# Patient Record
Sex: Female | Born: 1935 | Race: White | Hispanic: No | Smoking: Never smoker
Health system: Southern US, Community
[De-identification: ages and names within clinical notes are randomized; demographics above are authoritative.]

## PROBLEM LIST (undated history)

## (undated) DIAGNOSIS — C44301 Unspecified malignant neoplasm of skin of nose: Secondary | ICD-10-CM

## (undated) DIAGNOSIS — I4891 Unspecified atrial fibrillation: Secondary | ICD-10-CM

## (undated) DIAGNOSIS — M7070 Other bursitis of hip, unspecified hip: Secondary | ICD-10-CM

## (undated) DIAGNOSIS — M1990 Unspecified osteoarthritis, unspecified site: Secondary | ICD-10-CM

## (undated) DIAGNOSIS — N19 Unspecified kidney failure: Secondary | ICD-10-CM

## (undated) DIAGNOSIS — I10 Essential (primary) hypertension: Secondary | ICD-10-CM

## (undated) DIAGNOSIS — R519 Headache, unspecified: Secondary | ICD-10-CM

## (undated) DIAGNOSIS — M8580 Other specified disorders of bone density and structure, unspecified site: Secondary | ICD-10-CM

## (undated) DIAGNOSIS — I509 Heart failure, unspecified: Secondary | ICD-10-CM

## (undated) DIAGNOSIS — I447 Left bundle-branch block, unspecified: Secondary | ICD-10-CM

## (undated) DIAGNOSIS — R4182 Altered mental status, unspecified: Secondary | ICD-10-CM

## (undated) DIAGNOSIS — I428 Other cardiomyopathies: Secondary | ICD-10-CM

## (undated) DIAGNOSIS — G500 Trigeminal neuralgia: Secondary | ICD-10-CM

## (undated) DIAGNOSIS — N390 Urinary tract infection, site not specified: Secondary | ICD-10-CM

## (undated) DIAGNOSIS — J449 Chronic obstructive pulmonary disease, unspecified: Secondary | ICD-10-CM

## (undated) DIAGNOSIS — E785 Hyperlipidemia, unspecified: Secondary | ICD-10-CM

## (undated) DIAGNOSIS — G4700 Insomnia, unspecified: Secondary | ICD-10-CM

## (undated) DIAGNOSIS — D649 Anemia, unspecified: Secondary | ICD-10-CM

## (undated) DIAGNOSIS — T8859XA Other complications of anesthesia, initial encounter: Secondary | ICD-10-CM

## (undated) DIAGNOSIS — T4145XA Adverse effect of unspecified anesthetic, initial encounter: Secondary | ICD-10-CM

## (undated) DIAGNOSIS — R51 Headache: Secondary | ICD-10-CM

## (undated) DIAGNOSIS — I519 Heart disease, unspecified: Secondary | ICD-10-CM

## (undated) DIAGNOSIS — E871 Hypo-osmolality and hyponatremia: Secondary | ICD-10-CM

## (undated) DIAGNOSIS — Z9981 Dependence on supplemental oxygen: Secondary | ICD-10-CM

## (undated) HISTORY — DX: Hyperlipidemia, unspecified: E78.5

## (undated) HISTORY — PX: TONSILLECTOMY AND ADENOIDECTOMY: SUR1326

## (undated) HISTORY — PX: APPENDECTOMY: SHX54

## (undated) HISTORY — PX: CATARACT EXTRACTION W/ INTRAOCULAR LENS  IMPLANT, BILATERAL: SHX1307

## (undated) HISTORY — PX: DILATION AND CURETTAGE OF UTERUS: SHX78

## (undated) HISTORY — DX: Left bundle-branch block, unspecified: I44.7

## (undated) HISTORY — DX: Headache: R51

## (undated) HISTORY — DX: Heart failure, unspecified: I50.9

## (undated) HISTORY — PX: BREAST SURGERY: SHX581

## (undated) HISTORY — DX: Headache, unspecified: R51.9

## (undated) HISTORY — DX: Other cardiomyopathies: I42.8

## (undated) HISTORY — PX: SKIN CANCER EXCISION: SHX779

## (undated) HISTORY — DX: Heart disease, unspecified: I51.9

## (undated) HISTORY — DX: Unspecified kidney failure: N19

---

## 2009-12-04 HISTORY — PX: NM MYOCAR PERF WALL MOTION: HXRAD629

## 2011-12-05 HISTORY — PX: TRANSTHORACIC ECHOCARDIOGRAM: SHX275

## 2012-06-28 ENCOUNTER — Ambulatory Visit (INDEPENDENT_AMBULATORY_CARE_PROVIDER_SITE_OTHER): Payer: Medicare Other | Admitting: Emergency Medicine

## 2012-06-28 VITALS — BP 128/88 | HR 96 | Temp 97.6°F | Resp 16 | Ht 61.0 in | Wt 133.4 lb

## 2012-06-28 DIAGNOSIS — L03211 Cellulitis of face: Secondary | ICD-10-CM

## 2012-06-28 MED ORDER — MUPIROCIN CALCIUM 2 % EX CREA: TOPICAL_CREAM | Freq: Three times a day (TID) | CUTANEOUS | Status: AC

## 2012-06-28 MED ORDER — SULFAMETHOXAZOLE-TRIMETHOPRIM 800-160 MG PO TABS: 1.0000 | ORAL_TABLET | Freq: Two times a day (BID) | ORAL | Status: DC

## 2012-06-28 MED ORDER — LEVALBUTEROL TARTRATE 45 MCG/ACT IN AERO: 1.0000 | INHALATION_SPRAY | RESPIRATORY_TRACT | Status: DC | PRN

## 2012-06-28 NOTE — Progress Notes (Signed)
   Date:  06/28/2012   Name:  Angela Franco   DOB:  07-23-1929   MRN:  [id number]  PCP:  No primary provider on file.    Chief Complaint: Facial Injury   History of Present Illness:  Angela Franco is a 78 y.o. very pleasant female patient who presents with the following:  Had scraping of a superficial skin cancer done a week ago.  Now infected with a low grade cellulitis  No fever or chills.  No other complaint  There is no problem list on file for this patient.   No past medical history on file.  No past surgical history on file.  History  Substance Use Topics  . Smoking status: Never Smoker   . Smokeless tobacco: Not on file  . Alcohol Use: Not on file    No family history on file.  Allergies  Allergen Reactions  . Penicillins Hives    Medication list has been reviewed and updated.  Current Outpatient Prescriptions on File Prior to Visit  Medication Sig Dispense Refill  . amLODipine (NORVASC) 5 MG tablet Take 5 mg by mouth daily.      . calcium carbonate (OS-CAL) 600 MG TABS Take 600 mg by mouth 2 (two) times daily with a meal.      . carbamazepine (CARBATROL) 100 MG 12 hr capsule Take 100 mg by mouth 2 (two) times daily.      . fexofenadine (ALLEGRA) 180 MG tablet Take 180 mg by mouth daily.      [name] losartan (COZAAR) 100 MG tablet Take 100 mg by mouth daily.      . rosuvastatin (CRESTOR) 10 MG tablet Take 10 mg by mouth daily.      [name] tiotropium (SPIRIVA) 18 MCG inhalation capsule Place 18 mcg into inhaler and inhale daily.      . traZODone (DESYREL) 50 MG tablet Take 50 mg by mouth at bedtime. 1-2 at bedtime as needed.        Review of Systems:  As per HPI, otherwise negative.    Physical Examination: Filed Vitals:   06/28/12 1432  BP: 128/88  Pulse: 96  Temp: 97.6 F (36.4 C)  Resp: 16   Filed Vitals:   06/28/12 1432  Height: 5\' 1"  (1.549 m)  Weight: 133 lb 6.4 oz (60.51 kg)   Body mass index is 25.21 kg/(m^2). Ideal Body Weight: Weight in  (lb) to have BMI = 25: 132    GEN: WDWN, NAD, Non-toxic, Alert & Oriented x 3 HEENT: Atraumatic, Normocephalic.  Ears and Nose: No external deformity. EXTR: No clubbing/cyanosis/edema NEURO: Normal gait.  PSYCH: Normally interactive. Conversant. Not depressed or anxious appearing.  Calm demeanor.  Nose surgical wound with cellulitis  Assessment and Plan: Cellulitis nose asthma  Carmelina Dane, MD

## 2012-06-28 NOTE — Patient Instructions (Addendum)

## 2012-06-29 ENCOUNTER — Other Ambulatory Visit: Payer: Self-pay

## 2012-06-29 ENCOUNTER — Encounter (HOSPITAL_COMMUNITY): Payer: Self-pay | Admitting: *Deleted

## 2012-06-29 ENCOUNTER — Emergency Department (HOSPITAL_COMMUNITY): Payer: Medicare Other

## 2012-06-29 ENCOUNTER — Inpatient Hospital Stay (HOSPITAL_COMMUNITY)
Admission: EM | Admit: 2012-06-29 | Discharge: 2012-07-08 | DRG: 287 | Disposition: A | Discharge status: Nursing Home (Includes ICF) | Payer: Medicare Other | Attending: Internal Medicine | Admitting: Internal Medicine

## 2012-06-29 DIAGNOSIS — Z66 Do not resuscitate: Secondary | ICD-10-CM | POA: Diagnosis present

## 2012-06-29 DIAGNOSIS — I482 Chronic atrial fibrillation, unspecified: Secondary | ICD-10-CM | POA: Diagnosis present

## 2012-06-29 DIAGNOSIS — I10 Essential (primary) hypertension: Secondary | ICD-10-CM | POA: Diagnosis present

## 2012-06-29 DIAGNOSIS — R338 Other retention of urine: Secondary | ICD-10-CM | POA: Diagnosis not present

## 2012-06-29 DIAGNOSIS — R791 Abnormal coagulation profile: Secondary | ICD-10-CM | POA: Diagnosis present

## 2012-06-29 DIAGNOSIS — I272 Pulmonary hypertension, unspecified: Secondary | ICD-10-CM | POA: Diagnosis present

## 2012-06-29 DIAGNOSIS — L03211 Cellulitis of face: Secondary | ICD-10-CM | POA: Diagnosis present

## 2012-06-29 DIAGNOSIS — I428 Other cardiomyopathies: Secondary | ICD-10-CM | POA: Diagnosis present

## 2012-06-29 DIAGNOSIS — R531 Weakness: Secondary | ICD-10-CM

## 2012-06-29 DIAGNOSIS — R7989 Other specified abnormal findings of blood chemistry: Secondary | ICD-10-CM | POA: Diagnosis present

## 2012-06-29 DIAGNOSIS — Z7901 Long term (current) use of anticoagulants: Secondary | ICD-10-CM

## 2012-06-29 DIAGNOSIS — L0201 Cutaneous abscess of face: Secondary | ICD-10-CM

## 2012-06-29 DIAGNOSIS — I2789 Other specified pulmonary heart diseases: Secondary | ICD-10-CM | POA: Diagnosis present

## 2012-06-29 DIAGNOSIS — D649 Anemia, unspecified: Secondary | ICD-10-CM | POA: Diagnosis present

## 2012-06-29 DIAGNOSIS — I509 Heart failure, unspecified: Secondary | ICD-10-CM | POA: Diagnosis present

## 2012-06-29 DIAGNOSIS — I5021 Acute systolic (congestive) heart failure: Principal | ICD-10-CM

## 2012-06-29 DIAGNOSIS — T45515A Adverse effect of anticoagulants, initial encounter: Secondary | ICD-10-CM | POA: Diagnosis present

## 2012-06-29 DIAGNOSIS — I447 Left bundle-branch block, unspecified: Secondary | ICD-10-CM | POA: Diagnosis present

## 2012-06-29 DIAGNOSIS — I4891 Unspecified atrial fibrillation: Secondary | ICD-10-CM

## 2012-06-29 DIAGNOSIS — R109 Unspecified abdominal pain: Secondary | ICD-10-CM | POA: Diagnosis not present

## 2012-06-29 DIAGNOSIS — E871 Hypo-osmolality and hyponatremia: Secondary | ICD-10-CM | POA: Diagnosis present

## 2012-06-29 DIAGNOSIS — G500 Trigeminal neuralgia: Secondary | ICD-10-CM | POA: Diagnosis present

## 2012-06-29 DIAGNOSIS — I5043 Acute on chronic combined systolic (congestive) and diastolic (congestive) heart failure: Secondary | ICD-10-CM | POA: Diagnosis present

## 2012-06-29 DIAGNOSIS — R110 Nausea: Secondary | ICD-10-CM | POA: Diagnosis not present

## 2012-06-29 HISTORY — DX: Urinary tract infection, site not specified: N39.0

## 2012-06-29 HISTORY — DX: Essential (primary) hypertension: I10

## 2012-06-29 HISTORY — DX: Chronic obstructive pulmonary disease, unspecified: J44.9

## 2012-06-29 HISTORY — DX: Trigeminal neuralgia: G50.0

## 2012-06-29 HISTORY — DX: Other bursitis of hip, unspecified hip: M70.70

## 2012-06-29 HISTORY — DX: Insomnia, unspecified: G47.00

## 2012-06-29 HISTORY — DX: Hypo-osmolality and hyponatremia: E87.1

## 2012-06-29 HISTORY — DX: Other specified disorders of bone density and structure, unspecified site: M85.80

## 2012-06-29 HISTORY — DX: Unspecified atrial fibrillation: I48.91

## 2012-06-29 LAB — TROPONIN I
Troponin I: 0.63 ng/mL (ref <0.30)
Troponin I: 0.59 ng/mL (ref <0.30)

## 2012-06-29 LAB — CBC WITH DIFFERENTIAL/PLATELET
Basophils Relative: 0 % (ref 0–1)
HCT: 30.6 % — ABNORMAL LOW (ref 36.0–46.0)
Hemoglobin: 10.7 g/dL — ABNORMAL LOW (ref 12.0–15.0)
Lymphocytes Relative: 7 % — ABNORMAL LOW (ref 12–46)
Lymphs Abs: 0.5 10*3/uL — ABNORMAL LOW (ref 0.7–4.0)
Monocytes Absolute: 0.9 10*3/uL (ref 0.1–1.0)
Monocytes Relative: 12 % (ref 3–12)
Neutro Abs: 5.8 10*3/uL (ref 1.7–7.7)
Neutrophils Relative %: 80 % — ABNORMAL HIGH (ref 43–77)
RBC: 3.6 MIL/uL — ABNORMAL LOW (ref 3.87–5.11)
WBC: 7.2 10*3/uL (ref 4.0–10.5)

## 2012-06-29 LAB — BASIC METABOLIC PANEL
BUN: 16 mg/dL (ref 6–23)
CO2: 25 mEq/L (ref 19–32)
Chloride: 82 mEq/L — ABNORMAL LOW (ref 96–112)
GFR calc Af Amer: 59 mL/min — ABNORMAL LOW (ref >90)
Glucose, Bld: 97 mg/dL (ref 70–99)
Potassium: 3.8 mEq/L (ref 3.5–5.1)

## 2012-06-29 LAB — CARDIAC PANEL(CRET KIN+CKTOT+MB+TROPI)
Relative Index: INVALID (ref 0.0–2.5)
Total CK: 21 U/L (ref 7–177)

## 2012-06-29 LAB — URIC ACID: Uric Acid, Serum: 3.6 mg/dL (ref 2.4–7.0)

## 2012-06-29 LAB — URINALYSIS, ROUTINE W REFLEX MICROSCOPIC
Bilirubin Urine: NEGATIVE
Glucose, UA: NEGATIVE mg/dL
Nitrite: NEGATIVE
Specific Gravity, Urine: 1.016 (ref 1.005–1.030)
pH: 6.5 (ref 5.0–8.0)

## 2012-06-29 LAB — URINE MICROSCOPIC-ADD ON

## 2012-06-29 LAB — PROTIME-INR: Prothrombin Time: 49 seconds — ABNORMAL HIGH (ref 11.6–15.2)

## 2012-06-29 MED ORDER — SULFAMETHOXAZOLE-TMP DS 800-160 MG PO TABS
1.0000 | ORAL_TABLET | Freq: Two times a day (BID) | ORAL | Status: DC
  Filled 2012-06-29: qty 1

## 2012-06-29 MED ORDER — ATORVASTATIN CALCIUM 20 MG PO TABS
20.0000 mg | ORAL_TABLET | Freq: Every day | ORAL | Status: DC
  Administered 2012-06-29 – 2012-07-07 (×9): 20 mg via ORAL
  Filled 2012-06-29 (×10): qty 1

## 2012-06-29 MED ORDER — CARBAMAZEPINE ER 100 MG PO CP12: 100.0000 mg | ORAL_CAPSULE | Freq: Two times a day (BID) | ORAL | Status: DC

## 2012-06-29 MED ORDER — ZOLPIDEM TARTRATE 5 MG PO TABS
5.0000 mg | ORAL_TABLET | Freq: Every evening | ORAL | Status: DC | PRN
  Administered 2012-06-29 – 2012-07-04 (×2): 5 mg via ORAL
  Filled 2012-06-29 (×2): qty 1

## 2012-06-29 MED ORDER — SODIUM CHLORIDE 0.9 % IV SOLN: INTRAVENOUS | Status: DC

## 2012-06-29 MED ORDER — ONDANSETRON HCL 4 MG/2ML IJ SOLN
4.0000 mg | Freq: Four times a day (QID) | INTRAMUSCULAR | Status: DC | PRN
  Administered 2012-06-30 – 2012-07-03 (×3): 4 mg via INTRAVENOUS
  Filled 2012-06-29 (×3): qty 2

## 2012-06-29 MED ORDER — MUPIROCIN CALCIUM 2 % EX CREA
TOPICAL_CREAM | Freq: Three times a day (TID) | CUTANEOUS | Status: DC
  Administered 2012-06-29 – 2012-07-08 (×24): via TOPICAL
  Filled 2012-06-29 (×2): qty 15

## 2012-06-29 MED ORDER — COUMADIN BOOK
Freq: Once | Status: AC
  Administered 2012-06-29: 20:00:00
  Filled 2012-06-29: qty 1

## 2012-06-29 MED ORDER — SULFAMETHOXAZOLE-TRIMETHOPRIM 800-160 MG PO TABS: 1.0000 | ORAL_TABLET | Freq: Two times a day (BID) | ORAL | Status: DC

## 2012-06-29 MED ORDER — ASPIRIN EC 81 MG PO TBEC
81.0000 mg | DELAYED_RELEASE_TABLET | Freq: Every day | ORAL | Status: DC
  Administered 2012-06-29 – 2012-07-08 (×9): 81 mg via ORAL
  Filled 2012-06-29 (×9): qty 1

## 2012-06-29 MED ORDER — SODIUM CHLORIDE 0.9 % IV SOLN: 250.0000 mL | INTRAVENOUS | Status: DC | PRN

## 2012-06-29 MED ORDER — SODIUM CHLORIDE 0.9 % IJ SOLN: 3.0000 mL | INTRAMUSCULAR | Status: DC | PRN

## 2012-06-29 MED ORDER — ACETAMINOPHEN 325 MG PO TABS
650.0000 mg | ORAL_TABLET | ORAL | Status: DC | PRN
  Administered 2012-06-29 – 2012-07-02 (×6): 650 mg via ORAL
  Filled 2012-06-29 (×7): qty 2

## 2012-06-29 MED ORDER — FUROSEMIDE 10 MG/ML IJ SOLN
40.0000 mg | Freq: Two times a day (BID) | INTRAMUSCULAR | Status: DC
  Administered 2012-06-29 – 2012-07-01 (×4): 40 mg via INTRAVENOUS
  Filled 2012-06-29 (×6): qty 4

## 2012-06-29 MED ORDER — POTASSIUM CHLORIDE CRYS ER 20 MEQ PO TBCR
20.0000 meq | EXTENDED_RELEASE_TABLET | Freq: Two times a day (BID) | ORAL | Status: DC
  Administered 2012-06-29 – 2012-07-08 (×18): 20 meq via ORAL
  Filled 2012-06-29 (×20): qty 1

## 2012-06-29 MED ORDER — SODIUM CHLORIDE 0.9 % IJ SOLN: 3.0000 mL | Freq: Two times a day (BID) | INTRAMUSCULAR | Status: DC

## 2012-06-29 MED ORDER — CARBAMAZEPINE ER 100 MG PO TB12
100.0000 mg | ORAL_TABLET | Freq: Two times a day (BID) | ORAL | Status: DC
  Administered 2012-06-29 – 2012-07-08 (×18): 100 mg via ORAL
  Filled 2012-06-29 (×20): qty 1

## 2012-06-29 MED ORDER — AMLODIPINE BESYLATE 5 MG PO TABS
5.0000 mg | ORAL_TABLET | Freq: Every day | ORAL | Status: DC
  Administered 2012-06-30 – 2012-07-04 (×4): 5 mg via ORAL
  Filled 2012-06-29 (×5): qty 1

## 2012-06-29 MED ORDER — DOXYCYCLINE HYCLATE 100 MG PO TABS
100.0000 mg | ORAL_TABLET | Freq: Two times a day (BID) | ORAL | Status: DC
  Administered 2012-06-29 – 2012-07-01 (×5): 100 mg via ORAL
  Filled 2012-06-29 (×8): qty 1

## 2012-06-29 MED ORDER — LORATADINE 10 MG PO TABS
10.0000 mg | ORAL_TABLET | Freq: Every day | ORAL | Status: DC
  Administered 2012-07-01 – 2012-07-08 (×8): 10 mg via ORAL
  Filled 2012-06-29 (×9): qty 1

## 2012-06-29 MED ORDER — LOSARTAN POTASSIUM 50 MG PO TABS: 100.0000 mg | ORAL_TABLET | Freq: Every day | ORAL | Status: DC

## 2012-06-29 MED ORDER — CARVEDILOL 6.25 MG PO TABS
9.3750 mg | ORAL_TABLET | Freq: Two times a day (BID) | ORAL | Status: DC
  Administered 2012-06-29 – 2012-07-01 (×4): 9.375 mg via ORAL
  Filled 2012-06-29 (×7): qty 1

## 2012-06-29 MED ORDER — LOSARTAN POTASSIUM 50 MG PO TABS
100.0000 mg | ORAL_TABLET | Freq: Every day | ORAL | Status: DC
  Administered 2012-06-30: 100 mg via ORAL
  Filled 2012-06-29 (×2): qty 2

## 2012-06-29 MED ORDER — TIOTROPIUM BROMIDE MONOHYDRATE 18 MCG IN CAPS
18.0000 ug | ORAL_CAPSULE | Freq: Every day | RESPIRATORY_TRACT | Status: DC
  Administered 2012-06-30 – 2012-07-08 (×8): 18 ug via RESPIRATORY_TRACT
  Filled 2012-06-29 (×2): qty 5

## 2012-06-29 MED ORDER — WARFARIN VIDEO
Freq: Once | Status: AC
  Administered 2012-06-30: 12:00:00

## 2012-06-29 NOTE — Progress Notes (Signed)
ANTICOAGULATION CONSULT NOTE - Initial Consult  Pharmacy Consult for Coumadin Indication: atrial fibrillation  Allergies  Allergen Reactions  . Penicillins Hives    Patient Measurements: Height: 5' (152.4 cm) Weight: 130 lb 4.7 oz (59.1 kg) (scale b) IBW/kg (Calculated) : 45.5  Heparin Dosing Weight:   Vital Signs: Temp: 98 F (36.7 C) (07/27 1734) Temp src: Oral (07/27 1734) BP: 143/85 mmHg (07/27 1734) Pulse Rate: 110  (07/27 1734)  Labs:  Basename 06/29/12 1824 06/29/12 1749 06/29/12 1423 06/29/12 1205  HGB -- -- -- 10.7*  HCT -- -- -- 30.6*  PLT -- -- -- 141*  APTT -- -- -- --  LABPROT 49.0* -- -- --  INR 5.26* -- -- --  HEPARINUNFRC -- -- -- --  CREATININE -- -- -- 1.02  CKTOTAL -- 21 -- --  CKMB -- 1.3 -- --  TROPONINI -- 0.63* 0.59* 0.63*    Estimated Creatinine Clearance: 35.3 ml/min (by C-G formula based on Cr of 1.02).   Medical History: Past Medical History  Diagnosis Date  . COPD (chronic obstructive pulmonary disease)     Never smoked. Questionable diagnosis.  . Atrial fibrillation   . Osteopenia   . Insomnia   . Hyponatremia   . Hypertension   . UTI (lower urinary tract infection)   . Bursitis of hip   . Trigeminal neuralgia     Medications:  Scheduled:    . amLODipine  5 mg Oral Daily  . aspirin EC  81 mg Oral Daily  . atorvastatin  20 mg Oral q1800  . carbamazepine  100 mg Oral BID  . carvedilol  9.375 mg Oral BID WC  . doxycycline  100 mg Oral Q12H  . furosemide  40 mg Intravenous BID  . loratadine  10 mg Oral Daily  . losartan  100 mg Oral Daily  . mupirocin cream   Topical TID  . potassium chloride  20 mEq Oral BID  . tiotropium  18 mcg Inhalation Daily  . DISCONTD: sodium chloride   Intravenous STAT  . DISCONTD: carbamazepine  100 mg Oral BID  . DISCONTD: losartan  100 mg Oral Daily  . DISCONTD: sodium chloride  3 mL Intravenous Q12H  . DISCONTD: sulfamethoxazole-trimethoprim  1 tablet Oral Q12H  . DISCONTD:  sulfamethoxazole-trimethoprim  1 tablet Oral BID    Assessment: 78 yo female with AFib who has been receiving Septra at home for a infection that developed after skin CA excision.  Septra has a significant interaction with Coumadin which may increase the Coumadin effect; her baseline INR is 5.26.  No bleeding problems noted.  She has not taken her Coumadin today.  Goal of Therapy:  INR 2-3 Monitor platelets by anticoagulation protocol: Yes   Plan:  1.  No Coumadin this evening 2.  Daily INR 3.  Coumadin book & video  Marisue Humble, PharmD Clinical Pharmacist [facility]

## 2012-06-29 NOTE — ED Notes (Signed)
Charge made aware for need for room, will move pt back

## 2012-06-29 NOTE — Progress Notes (Signed)
Made MD aware of Troponin levels and INR value. No new orders given. MD stated to call if next lab draw level is elevated or if anything changes. Will report to receiving RN to monitor for further changes in condition.

## 2012-06-29 NOTE — Progress Notes (Signed)
CRITICAL VALUE ALERT  Critical value received: INR 5.26  Date of notification:  06/29/12  Time of notification:  1857  Critical value read back:yes  Nurse who received alert:  Junie Panning RN  MD notified (1st page):  Benedetto Coons  Time of first page: 2025  MD notified (2nd page):  Time of second page:  Responding MD:  Benedetto Coons  Time MD responded:  2030

## 2012-06-29 NOTE — ED Notes (Signed)
Family reports fever, weakness, lower extremity swelling (hx of new onset chf), back pain, and had appt with dermatologist this week and reports infection from that procedure (pt was placed on antibiotics). Pt is new to area.

## 2012-06-29 NOTE — H&P (Signed)
History and Physical  Angela Franco WUJ:[id number] DOB: [birth year] DOA: 06/29/2012  Referring physician: [name], MD PCP: No primary provider on file. None in Angela Franco. Former PCP [name], MD [place], [place]  Chief Complaint: weakness  HPI:  78 year old woman with history of hyponatremia (on Carbamazepine) last hospitalized January of this year for the same (responded to fluid restriction) presented today with 1-2 week history of gradual decline, lethargy, some confusion and increasing lower extremity edema. She reports she is chronic hyponatremia but cannot recall her baseline. She refuses to discontinue carbamazepine because of intractable pain without it.  She has been told that she is "right heart failure" was not been on diuretics because of her history of hyponatremia. She denies any previous cardiac history other than atrial fibrillation. Approximately one week ago she had skin cancer removed from her nose. She was seen in urgent care later started on antibiotics (Septra) for post procedure infection.  In the emergency department she was noted to be afebrile with stable vital signs. Sodium was 120, chloride 82, hemoglobin 10.7, troponin 0.59, BNP 3820. Chest x-ray was negative. EKG independently reviewed showed atrial fibrillation with LBBB (old), anterior infarct (old).  Patient noted back pain upon awakening this morning which has been present all day and improves with movement.  Record Review (obtained by fax from PCP's office):  05/15/12 Hemoglobin 10.9, sodium 126, chloride 88  05/15/12 2-d echocardiogram: LVEF 40-45%, RV pressure 73, severe pulmonary hypertension, moderate tricuspid regurgitation, right atrium moderate-severe dilatation, moderate anterior/anteroseptal hypokinesis.  EKG (undated): SR, LBBB, anterior infarct, old. Overall appearance similar to today's EKG.  Review of Systems:  Negative for fever, changes to her vision, sore throat, rash, new muscle  aches, dysuria, bleeding, nausea, vomiting. Positive for shortness of breath has been progressive.  Past Medical History  Diagnosis Date  . COPD (chronic obstructive pulmonary disease)     Never smoked. Questionable diagnosis.  . Atrial fibrillation   . Osteopenia   . Insomnia   . Hyponatremia   . Hypertension   . UTI (lower urinary tract infection)   . Bursitis of hip   . Trigeminal neuralgia    Past Surgical History  Procedure Date  . Appendectomy   . Tonsillectomy and adenoidectomy    Social History:  reports that she has never smoked. She does not have any smokeless tobacco history on file. Her alcohol and drug histories not on file.  Allergies  Allergen Reactions  . Penicillins Hives   Family History  Problem Relation Age of Onset  . Heart attack Brother   . Heart attack Father    Prior to Admission medications   Medication Sig Start Date End Date Taking? Authorizing Provider  amLODipine (NORVASC) 5 MG tablet Take 5 mg by mouth daily.   Yes Historical Provider, MD  calcium carbonate (OS-CAL) 600 MG TABS Take 600 mg by mouth 2 (two) times daily with a meal.   Yes Historical Provider, MD  carbamazepine (CARBATROL) 100 MG 12 hr capsule Take 100 mg by mouth 2 (two) times daily.   Yes Historical Provider, MD  carvedilol (COREG) 6.25 MG tablet Take 9.37 mg by mouth 2 (two) times daily with a meal.    Yes Historical Provider, MD  fexofenadine (ALLEGRA) 180 MG tablet Take 180 mg by mouth daily.   Yes Historical Provider, MD  levalbuterol [facility] HFA) 45 MCG/ACT inhaler Inhale 1-2 puffs into the lungs every 4 (four) hours as needed for wheezing. 06/28/12 06/28/13 Yes Phillips Odor, MD  losartan (COZAAR)  100 MG tablet Take 100 mg by mouth daily.   Yes Historical Provider, MD  mupirocin cream (BACTROBAN) 2 % Apply topically 3 (three) times daily. 06/28/12 07/05/12 Yes Phillips Odor, MD  rosuvastatin (CRESTOR) 10 MG tablet Take 10 mg by mouth daily.   Yes Historical Provider, MD    sulfamethoxazole-trimethoprim (BACTRIM DS,SEPTRA DS) 800-160 MG per tablet Take 1 tablet by mouth 2 (two) times daily. For 10 days Started 7/26   Yes Historical Provider, MD  tiotropium (SPIRIVA) 18 MCG inhalation capsule Place 18 mcg into inhaler and inhale daily.   Yes Historical Provider, MD  traZODone (DESYREL) 50 MG tablet Take 100 mg by mouth at bedtime.    Yes Historical Provider, MD  warfarin (COUMADIN) 5 MG tablet Take 5-7.5 mg by mouth daily. 1 tablet on Tuesday and Friday 1.5 tablet all other days   Yes Historical Provider, MD   Physical Exam: Filed Vitals:   06/29/12 1202  BP: 136/68  Pulse: 100  Temp: 98.4 F (36.9 C)  TempSrc: Oral  Resp: 18  SpO2: 95%     General:  Appears calm and comfortable. Examined in the emergency department.  Eyes: Pupils equal, round, react to light. Normal lids, irises.  ENT: Grossly normal hearing. Lips and tongue appear unremarkable.  Neck: No lymphadenopathy or masses. No thyromegaly.  Cardiovascular: Irregular rhythm, tachycardia 100-110s. No murmur, rub or gallop. No lower extremity edema.  Respiratory: Posterior rales bilaterally. Good air movement. Normal respiratory effort. No wheezes or rhonchi.  Abdomen: Soft, nontender, nondistended.  Skin: Chronic venous stasis changes bilateral lower extremities. No rash or induration.  Musculoskeletal: Tone and strength appears grossly normal. Able to sit up in bed without difficulty.  Psychiatric: Grossly normal mood and affect. Speech fluent and appropriate.  Neurologic: Grossly nonfocal  Labs on Admission:  Basic Metabolic Panel:  Lab 06/29/12 4098  NA 120*  K 3.8  CL 82*  CO2 25  GLUCOSE 97  BUN 16  CREATININE 1.02  CALCIUM 9.3  MG --  PHOS --   CBC:  Lab 06/29/12 1205  WBC 7.2  NEUTROABS 5.8  HGB 10.7*  HCT 30.6*  MCV 85.0  PLT 141*   Cardiac Enzymes:  Lab 06/29/12 1423 06/29/12 1205  CKTOTAL -- --  CKMB -- --  CKMBINDEX -- --  TROPONINI 0.59* 0.63*     Basename 06/29/12 1423  PROBNP 3820.0*   Radiological Exams on Admission: Dg Chest 2 View  06/29/2012  *RADIOLOGY REPORT*  Clinical Data: Fever, leg swelling  CHEST - 2 VIEW  Comparison: None.  Findings: Cardiomediastinal silhouette is unremarkable.  No acute infiltrate or pulmonary edema.  Mild atelectasis right base. Nodular density right base may represent nipple shadow.  Repeat frontal view with nipple markers is recommended for confirmation. Osteopenia and mild degenerative changes thoracic spine.  IMPRESSION: No acute infiltrate or pulmonary edema. Mild atelectasis right base.  Nodular density right base may represent nipple shadow. Repeat frontal view with nipple markers is recommended for confirmation.  Original Report Authenticated By: Natasha Mead, M.D.   Principal Problem:  *Hyponatremia Active Problems:  Acute systolic CHF (congestive heart failure)  Elevated troponin  Normocytic anemia  Atrial fibrillation  Trigeminal neuralgia  Moderate to severe pulmonary hypertension   Assessment/Plan 1. Hyponatremia: Mildly symptomatic. Most likely secondary to Tegretol, possibly complicated by recent addition of Trazodone. Urine studies. Discontinue trazodone. Fluid restriction. Patient has severe pain when taken off Tegretol. 2. Acute systolic heart failure: Complicated by severe pulmonary hypertension. Mildly symptomatic. Lasix.  3. Severe pulmonary hypertension: Etiology unclear. No history to suggest VTE. Will discuss with PCP 7/29 re: any other investigation already performed. 4. Elevated troponin: Most likely related to acute heart failure. Patient has had back pain this morning but has not had chest pain. Her back pain improves with movement. No history to suggest acute coronary syndrome at this time. Serial cardiac enzymes. EKG without significant change compared to old. 5. Normocytic anemia: Stable. 6. Atrial fibrillation: Continue warfarin, carvedilol, losartan. 7. History of  cellulitis: Post procedure infection of the nose. Continue Bactrim. 8. Trigeminal neuralgia: Continue carbamazepine--patient refuses to discontinue and other agents have been ineffective-- resulting in excruciating pain.  See summary of records above (which I requested by telephone)  Code Status: DO NOT RESUSCITATE Family Communication: Discussed with daughter at bedside Disposition Plan: Home when improved  Time spent: 12 minutes  Brendia Sacks, MD  [facility] Pager [phone] If 7PM-7AM, please contact floor/night-coverage at [email or web address], password [facility] 06/29/2012, 3:51 PM

## 2012-06-29 NOTE — ED Provider Notes (Signed)
History     CSN: [id number]  Arrival date & time 06/29/12  1143   First MD Initiated Contact with Patient 06/29/12 1401      Chief Complaint  Patient presents with  . Fever  . Leg Swelling    (Consider location/radiation/quality/duration/timing/severity/associated sxs/prior treatment) HPI Comments: Angela Franco is a 78 y.o. Female with weakness and lethargy, worsening today. She was started on antibiotics yesterday, for a nose infection following a dermatologic nose, procedure, 6 days ago. She has had progressive, extremity edema, for one and half months. She saw her PCP,  earlier this week. Prior to moving to [place]. She had cardiac echo 1 month ago that reportedly showed right heart failure. She has general weakness. She denies chest pain. She has mild, shortness of breath. She's had chills today without documented fever. She denies cough. She's been taking her medications as prescribed. No aggravating or palliative factors  Patient is a 78 y.o. female presenting with fever. The history is provided by the patient.  Fever Primary symptoms of the febrile illness include fever.    Past Medical History  Diagnosis Date  . COPD (chronic obstructive pulmonary disease)   . Atrial fibrillation   . Osteopenia   . Insomnia   . Hyponatremia   . Hypertension   . UTI (lower urinary tract infection)   . Bursitis of hip     History reviewed. No pertinent past surgical history.  History reviewed. No pertinent family history.  History  Substance Use Topics  . Smoking status: Never Smoker   . Smokeless tobacco: Not on file  . Alcohol Use: Not on file    OB History    Grav Para Term Preterm Abortions TAB SAB Ect Mult Living                  Review of Systems  Constitutional: Positive for fever.  All other systems reviewed and are negative.    Allergies  Penicillins  Home Medications   Current Outpatient Rx  Name Route Sig Dispense Refill  . AMLODIPINE BESYLATE 5  MG PO TABS Oral Take 5 mg by mouth daily.    [name] CALCIUM CARBONATE 600 MG PO TABS Oral Take 600 mg by mouth 2 (two) times daily with a meal.    . CARBAMAZEPINE ER 100 MG PO CP12 Oral Take 100 mg by mouth 2 (two) times daily.    [name] CARVEDILOL 6.25 MG PO TABS Oral Take 9.37 mg by mouth 2 (two) times daily with a meal.     . FEXOFENADINE HCL 180 MG PO TABS Oral Take 180 mg by mouth daily.    [name] LEVALBUTEROL TARTRATE 45 MCG/ACT IN AERO Inhalation Inhale 1-2 puffs into the lungs every 4 (four) hours as needed for wheezing. 1 Inhaler 12  . LOSARTAN POTASSIUM 100 MG PO TABS Oral Take 100 mg by mouth daily.    [name] MUPIROCIN CALCIUM 2 % EX CREA Topical Apply topically 3 (three) times daily. 15 g 0  . ROSUVASTATIN CALCIUM 10 MG PO TABS Oral Take 10 mg by mouth daily.    . SULFAMETHOXAZOLE-TRIMETHOPRIM 800-160 MG PO TABS Oral Take 1 tablet by mouth 2 (two) times daily. For 10 days Started 7/26    . TIOTROPIUM BROMIDE MONOHYDRATE 18 MCG IN CAPS Inhalation Place 18 mcg into inhaler and inhale daily.    . TRAZODONE HCL 50 MG PO TABS Oral Take 100 mg by mouth at bedtime.     . WARFARIN SODIUM 5 MG PO TABS  Oral Take 5-7.5 mg by mouth daily. 1 tablet on Tuesday and Friday 1.5 tablet all other days      BP 136/68  Pulse 100  Temp 98.4 F (36.9 C) (Oral)  Resp 18  SpO2 95%  Physical Exam  Nursing note and vitals reviewed. Constitutional: She is oriented to person, place, and time. She appears well-developed. She appears distressed (Mild).       She is frail  HENT:  Head: Normocephalic and atraumatic.  Eyes: Conjunctivae and EOM are normal. Pupils are equal, round, and reactive to light.  Neck: Normal range of motion and phonation normal. Neck supple.  Cardiovascular: Normal rate, regular rhythm and intact distal pulses.   Pulmonary/Chest: Effort normal. No respiratory distress. She has no wheezes. She has rales (Bilateral). She exhibits no tenderness.  Abdominal: Soft. She exhibits no distension. There is  no tenderness. There is no guarding.  Musculoskeletal: Normal range of motion. She exhibits edema (2+, peripheral.). She exhibits no tenderness.  Neurological: She is alert and oriented to person, place, and time. She has normal strength. She exhibits normal muscle tone.  Skin: Skin is warm and dry.  Psychiatric: She has a normal mood and affect. Her behavior is normal.    ED Course  Procedures (including critical care time)   Date: 06/29/2012  Rate: 88  Rhythm: atrial fibrillation  QRS Axis: right  Intervals: QT normal  ST/T Wave abnormalities: nonspecific ST changes  Conduction Disutrbances:nonspecific intraventricular conduction delay  Narrative Interpretation:   Old EKG Reviewed: none available  BMP, and second cardiac troponin ordered.   Labs Reviewed  TROPONIN I - Abnormal; Notable for the following:    Troponin I 0.63 (*)     All other components within normal limits  CBC WITH DIFFERENTIAL - Abnormal; Notable for the following:    RBC 3.60 (*)     Hemoglobin 10.7 (*)     HCT 30.6 (*)     Platelets 141 (*)     Neutrophils Relative 80 (*)     Lymphocytes Relative 7 (*)     Lymphs Abs 0.5 (*)     All other components within normal limits  BASIC METABOLIC PANEL - Abnormal; Notable for the following:    Sodium 120 (*)     Chloride 82 (*)     GFR calc non Af Amer 51 (*)     GFR calc Af Amer 59 (*)     All other components within normal limits  URINALYSIS, ROUTINE W REFLEX MICROSCOPIC - Abnormal; Notable for the following:    Hgb urine dipstick SMALL (*)     Ketones, ur 15 (*)     All other components within normal limits  PRO B NATRIURETIC PEPTIDE - Abnormal; Notable for the following:    Pro B Natriuretic peptide (BNP) 3820.0 (*)     All other components within normal limits  TROPONIN I - Abnormal; Notable for the following:    Troponin I 0.59 (*)     All other components within normal limits  URINE MICROSCOPIC-ADD ON   Dg Chest 2 View  06/29/2012  *RADIOLOGY  REPORT*  Clinical Data: Fever, leg swelling  CHEST - 2 VIEW  Comparison: None.  Findings: Cardiomediastinal silhouette is unremarkable.  No acute infiltrate or pulmonary edema.  Mild atelectasis right base. Nodular density right base may represent nipple shadow.  Repeat frontal view with nipple markers is recommended for confirmation. Osteopenia and mild degenerative changes thoracic spine.  IMPRESSION: No acute infiltrate or  pulmonary edema. Mild atelectasis right base.  Nodular density right base may represent nipple shadow. Repeat frontal view with nipple markers is recommended for confirmation.  Original Report Authenticated By: Natasha Mead, M.D.     1. Weakness   2. Hyponatremia       MDM  Nonspecific weakness, with hyponatremia. Doubt ACS, pneumonia, or congestive heart failure. Troponin is unchanged over 2 samples. Recent cardiac echo results not available. Elevated BNP, is nonspecific. She'll need to be admitted for observation and further management.    Plan: Admit- [facility].    Flint Melter, MD 06/29/12 [phone]

## 2012-06-30 DIAGNOSIS — Z7901 Long term (current) use of anticoagulants: Secondary | ICD-10-CM

## 2012-06-30 DIAGNOSIS — I428 Other cardiomyopathies: Secondary | ICD-10-CM | POA: Diagnosis present

## 2012-06-30 DIAGNOSIS — I10 Essential (primary) hypertension: Secondary | ICD-10-CM | POA: Diagnosis present

## 2012-06-30 DIAGNOSIS — I447 Left bundle-branch block, unspecified: Secondary | ICD-10-CM | POA: Diagnosis present

## 2012-06-30 DIAGNOSIS — R791 Abnormal coagulation profile: Secondary | ICD-10-CM | POA: Diagnosis present

## 2012-06-30 LAB — CARDIAC PANEL(CRET KIN+CKTOT+MB+TROPI)
Relative Index: INVALID (ref 0.0–2.5)
Relative Index: INVALID (ref 0.0–2.5)
Total CK: 22 U/L (ref 7–177)
Troponin I: 0.32 ng/mL (ref <0.30)

## 2012-06-30 LAB — BASIC METABOLIC PANEL
Chloride: 83 mEq/L — ABNORMAL LOW (ref 96–112)
GFR calc Af Amer: 51 mL/min — ABNORMAL LOW (ref >90)
GFR calc non Af Amer: 44 mL/min — ABNORMAL LOW (ref >90)
Glucose, Bld: 110 mg/dL — ABNORMAL HIGH (ref 70–99)
Potassium: 3.5 mEq/L (ref 3.5–5.1)
Sodium: 121 mEq/L — ABNORMAL LOW (ref 135–145)

## 2012-06-30 LAB — OSMOLALITY, URINE: Osmolality, Ur: 296 mOsm/kg — ABNORMAL LOW (ref 390–1090)

## 2012-06-30 MED ORDER — WARFARIN - PHARMACIST DOSING INPATIENT: Freq: Every day | Status: DC

## 2012-06-30 MED ORDER — GUAIFENESIN-DM 100-10 MG/5ML PO SYRP
5.0000 mL | ORAL_SOLUTION | ORAL | Status: DC | PRN
  Administered 2012-06-30 – 2012-07-02 (×3): 5 mL via ORAL
  Filled 2012-06-30 (×4): qty 5

## 2012-06-30 NOTE — Progress Notes (Signed)
Lab called critical results for troponin 0.32.  Dr. Irene Limbo notified. No new orders given. Will continue to monitor.

## 2012-06-30 NOTE — Progress Notes (Signed)
Reason for Consult: SOB  Requesting Physician: Dr Irene Limbo  HPI: This is a 78 y.o. female with a past medical history significant for Rt heart failure by echo June 2013 and hyponatremia. She has no history of MI or CAD and has never had a cath. She recently moved to the area after her husband of 61 yrs died a couple of weeks ago, (he was an Pensions consultant in [place] for 60yrs). She has CAF and is on Coumadin. Echo in June 2013 showed an EF of 40-45% with severe pulm HTN, grade 3 diastolic dysfunction. There is an AS WMA suggestive of CAD. She is admitted no with increasing SOB, lower extremity edema, and pain with inspiration. Her Troponin is positive at 0.63.  PMHx:  Past Medical History  Diagnosis Date  . COPD (chronic obstructive pulmonary disease)     Never smoked. Questionable diagnosis.  . Atrial fibrillation   . Osteopenia   . Insomnia   . Hyponatremia   . Hypertension   . UTI (lower urinary tract infection)   . Bursitis of hip   . Trigeminal neuralgia    Past Surgical History  Procedure Date  . Appendectomy   . Tonsillectomy and adenoidectomy     FAMHx: Family History  Problem Relation Age of Onset  . Heart attack Brother   . Heart attack Father     SOCHx:  reports that she has never smoked. She does not have any smokeless tobacco history on file. Her alcohol and drug histories not on file.  ALLERGIES: Allergies  Allergen Reactions  . Penicillins Hives    ROS: Pertinent items are noted in HPI.  HOME MEDICATIONS: Prescriptions prior to admission  Medication Sig Dispense Refill  . amLODipine (NORVASC) 5 MG tablet Take 5 mg by mouth daily.      . calcium carbonate (OS-CAL) 600 MG TABS Take 600 mg by mouth 2 (two) times daily with a meal.      . carbamazepine (CARBATROL) 100 MG 12 hr capsule Take 100 mg by mouth 2 (two) times daily.      . carvedilol (COREG) 6.25 MG tablet Take 9.37 mg by mouth 2 (two) times daily with a meal.       . fexofenadine (ALLEGRA) 180  MG tablet Take 180 mg by mouth daily.      [name] levalbuterol (XOPENEX HFA) 45 MCG/ACT inhaler Inhale 1-2 puffs into the lungs every 4 (four) hours as needed for wheezing.  1 Inhaler  12  . losartan (COZAAR) 100 MG tablet Take 100 mg by mouth daily.      . mupirocin cream (BACTROBAN) 2 % Apply topically 3 (three) times daily.  15 g  0  . rosuvastatin (CRESTOR) 10 MG tablet Take 10 mg by mouth daily.      [name] sulfamethoxazole-trimethoprim (BACTRIM DS,SEPTRA DS) 800-160 MG per tablet Take 1 tablet by mouth 2 (two) times daily. For 10 days Started 7/26      . tiotropium (SPIRIVA) 18 MCG inhalation capsule Place 18 mcg into inhaler and inhale daily.      . traZODone (DESYREL) 50 MG tablet Take 100 mg by mouth at bedtime.       [name] warfarin (COUMADIN) 5 MG tablet Take 5-7.5 mg by mouth daily. 1 tablet on Tuesday and Friday 1.5 tablet all other days        HOSPITAL MEDICATIONS: I have reviewed the patient's current medications.  VITALS: Blood pressure 106/75, pulse 86, temperature 97.7 F (36.5 C), temperature source Oral, resp. rate  20, height 5' (1.524 m), weight 60.4 kg (133 lb 2.5 oz), SpO2 96.00%.  PHYSICAL EXAM: General appearance: alert, cooperative and no distress Neck: no adenopathy, no carotid bruit, no JVD, supple, symmetrical, trachea midline and thyroid not enlarged, symmetric, no tenderness/mass/nodules Lungs: decreased breath sounds and rales 1/2 up bilat Heart: irregularly irregular rhythm Abdomen: soft, non-tender; bowel sounds normal; no masses,  no organomegaly Extremities: 1+ bilat pre tibial edema Pulses: 2+ and symmetric Skin: cool and dry Neurologic: Grossly normal  LABS: Results for orders placed during the hospital encounter of 06/29/12 (from the past 48 hour(s))  TROPONIN I     Status: Abnormal   Collection Time   06/29/12 12:05 PM      Component Value Range Comment   Troponin I 0.63 (*) <0.30 ng/mL   CBC WITH DIFFERENTIAL     Status: Abnormal   Collection Time    06/29/12 12:05 PM      Component Value Range Comment   WBC 7.2  4.0 - 10.5 K/uL    RBC 3.60 (*) 3.87 - 5.11 MIL/uL    Hemoglobin 10.7 (*) 12.0 - 15.0 g/dL    HCT 16.1 (*) 09.6 - 46.0 %    MCV 85.0  78.0 - 100.0 fL    MCH 29.7  26.0 - 34.0 pg    MCHC 35.0  30.0 - 36.0 g/dL    RDW 04.5  40.9 - 81.1 %    Platelets 141 (*) 150 - 400 K/uL    Neutrophils Relative 80 (*) 43 - 77 %    Neutro Abs 5.8  1.7 - 7.7 K/uL    Lymphocytes Relative 7 (*) 12 - 46 %    Lymphs Abs 0.5 (*) 0.7 - 4.0 K/uL    Monocytes Relative 12  3 - 12 %    Monocytes Absolute 0.9  0.1 - 1.0 K/uL    Eosinophils Relative 0  0 - 5 %    Eosinophils Absolute 0.0  0.0 - 0.7 K/uL    Basophils Relative 0  0 - 1 %    Basophils Absolute 0.0  0.0 - 0.1 K/uL   BASIC METABOLIC PANEL     Status: Abnormal   Collection Time   06/29/12 12:05 PM      Component Value Range Comment   Sodium 120 (*) 135 - 145 mEq/L    Potassium 3.8  3.5 - 5.1 mEq/L    Chloride 82 (*) 96 - 112 mEq/L    CO2 25  19 - 32 mEq/L    Glucose, Bld 97  70 - 99 mg/dL    BUN 16  6 - 23 mg/dL    Creatinine, Ser 9.14  0.50 - 1.10 mg/dL    Calcium 9.3  8.4 - 78.2 mg/dL    GFR calc non Af Amer 51 (*) >90 mL/min    GFR calc Af Amer 59 (*) >90 mL/min   PRO B NATRIURETIC PEPTIDE     Status: Abnormal   Collection Time   06/29/12  2:23 PM      Component Value Range Comment   Pro B Natriuretic peptide (BNP) 3820.0 (*) 0 - 450 pg/mL   TROPONIN I     Status: Abnormal   Collection Time   06/29/12  2:23 PM      Component Value Range Comment   Troponin I 0.59 (*) <0.30 ng/mL   URINALYSIS, ROUTINE W REFLEX MICROSCOPIC     Status: Abnormal   Collection Time   06/29/12  2:46 PM      Component Value Range Comment   Color, Urine YELLOW  YELLOW    APPearance CLEAR  CLEAR    Specific Gravity, Urine 1.016  1.005 - 1.030    pH 6.5  5.0 - 8.0    Glucose, UA NEGATIVE  NEGATIVE mg/dL    Hgb urine dipstick SMALL (*) NEGATIVE    Bilirubin Urine NEGATIVE  NEGATIVE    Ketones, ur 15  (*) NEGATIVE mg/dL    Protein, ur NEGATIVE  NEGATIVE mg/dL    Urobilinogen, UA 0.2  0.0 - 1.0 mg/dL    Nitrite NEGATIVE  NEGATIVE    Leukocytes, UA NEGATIVE  NEGATIVE   URINE MICROSCOPIC-ADD ON     Status: Normal   Collection Time   06/29/12  2:46 PM      Component Value Range Comment   RBC / HPF 3-6  <3 RBC/hpf   OSMOLALITY     Status: Abnormal   Collection Time   06/29/12  5:49 PM      Component Value Range Comment   Osmolality 253 (*) 275 - 300 mOsm/kg   CARDIAC PANEL(CRET KIN+CKTOT+MB+TROPI)     Status: Abnormal   Collection Time   06/29/12  5:49 PM      Component Value Range Comment   Total CK 21  7 - 177 U/L    CK, MB 1.3  0.3 - 4.0 ng/mL    Troponin I 0.63 (*) <0.30 ng/mL    Relative Index RELATIVE INDEX IS INVALID  0.0 - 2.5   URIC ACID     Status: Normal   Collection Time   06/29/12  5:49 PM      Component Value Range Comment   Uric Acid, Serum 3.6  2.4 - 7.0 mg/dL   PROTIME-INR     Status: Abnormal   Collection Time   06/29/12  6:24 PM      Component Value Range Comment   Prothrombin Time 49.0 (*) 11.6 - 15.2 seconds    INR 5.26 (*) 0.00 - 1.49   SODIUM, URINE, RANDOM     Status: Normal   Collection Time   06/29/12 10:53 PM      Component Value Range Comment   Sodium, Ur 53     OSMOLALITY, URINE     Status: Abnormal   Collection Time   06/29/12 10:54 PM      Component Value Range Comment   Osmolality, Ur 296 (*) 390 - 1090 mOsm/kg   BASIC METABOLIC PANEL     Status: Abnormal   Collection Time   06/30/12  1:31 AM      Component Value Range Comment   Sodium 121 (*) 135 - 145 mEq/L    Potassium 3.5  3.5 - 5.1 mEq/L    Chloride 83 (*) 96 - 112 mEq/L    CO2 25  19 - 32 mEq/L    Glucose, Bld 110 (*) 70 - 99 mg/dL    BUN 15  6 - 23 mg/dL    Creatinine, Ser 1.61 (*) 0.50 - 1.10 mg/dL    Calcium 8.8  8.4 - 09.6 mg/dL    GFR calc non Af Amer 44 (*) >90 mL/min    GFR calc Af Amer 51 (*) >90 mL/min   PROTIME-INR     Status: Abnormal   Collection Time   06/30/12  1:31  AM      Component Value Range Comment   Prothrombin Time 54.8 (*) 11.6 - 15.2 seconds  INR 6.07 (*) 0.00 - 1.49   CARDIAC PANEL(CRET KIN+CKTOT+MB+TROPI)     Status: Normal   Collection Time   06/30/12  1:32 AM      Component Value Range Comment   Total CK 20  7 - 177 U/L    CK, MB 1.2  0.3 - 4.0 ng/mL    Troponin I <0.30  <0.30 ng/mL    Relative Index RELATIVE INDEX IS INVALID  0.0 - 2.5   CARDIAC PANEL(CRET KIN+CKTOT+MB+TROPI)     Status: Abnormal   Collection Time   06/30/12  9:00 AM      Component Value Range Comment   Total CK 22  7 - 177 U/L    CK, MB 1.3  0.3 - 4.0 ng/mL    Troponin I 0.32 (*) <0.30 ng/mL    Relative Index RELATIVE INDEX IS INVALID  0.0 - 2.5     IMAGING: Dg Chest 2 View  06/29/2012  *RADIOLOGY REPORT*  Clinical Data: Fever, leg swelling  CHEST - 2 VIEW  Comparison: None.  Findings: Cardiomediastinal silhouette is unremarkable.  No acute infiltrate or pulmonary edema.  Mild atelectasis right base. Nodular density right base may represent nipple shadow.  Repeat frontal view with nipple markers is recommended for confirmation. Osteopenia and mild degenerative changes thoracic spine.  IMPRESSION: No acute infiltrate or pulmonary edema. Mild atelectasis right base.  Nodular density right base may represent nipple shadow. Repeat frontal view with nipple markers is recommended for confirmation.  Original Report Authenticated By: Natasha Mead, M.D.    IMPRESSION: Principal Problem:  *Systolic and diastolic CHF, acute on chronic, (BNP 3800) Active Problems:  Hyponatremia, history of chronic hyponatremia  Elevated troponin, NSTEMI vs secondary elevation from CHF  Moderate to severe pulmonary hypertension, PA pressure in 70s June 2013  Elevated INR  Cardiomyopathy, EF 40-45% 2D June 2013, possibly ischemic with AS WMA  Normocytic anemia  Atrial fibrillation, chronic  Trigeminal neuralgia  LBBB (left bundle branch block)  Chronic anticoagulation  HTN  (hypertension)   RECOMMENDATION: Continue to diureses. She will need some type of ischemic work up when stable- ?Myoview (INR 6.0). MD to see.  Time Spent Directly with Patient: 45 minutes  KILROY,LUKE K 06/30/2012, 12:43 PM     Patient seen and examined. Agree with assessment and plan. Very pleasant 78 yo WF with h/o AF, mild LV systolic dysfunction with EF of 40 -45% by recentt echo with associated anterior/anteroseptal hypokinesis. She has Grade 3 diastolic dysfunction and severe pulmonary hypertension. She now presents with dyspnea and pleuritic like chest pain. Agree with diuresis. INR is currently supra-therapeutic. Will need ischemic evaluation with wall motion abnormality with risk assessment with myoview versus R and L heart cath.    Lennette Bihari, MD, [facility] 06/30/2012 12:59 PM

## 2012-06-30 NOTE — Progress Notes (Signed)
ANTICOAGULATION CONSULT NOTE - Follow Up Consult  Pharmacy Consult for Coumadin Indication: atrial fibrillation  Allergies  Allergen Reactions  . Penicillins Hives    Patient Measurements: Height: 5' (152.4 cm) Weight: 133 lb 2.5 oz (60.4 kg) IBW/kg (Calculated) : 45.5   Vital Signs: Temp: 97.7 F (36.5 C) (07/28 0649) Temp src: Oral (07/28 0649) BP: 106/75 mmHg (07/28 0935) Pulse Rate: 86  (07/28 0935)  Labs:  Basename 06/30/12 0900 06/30/12 0132 06/30/12 0131 06/29/12 1824 06/29/12 1749 06/29/12 1205  HGB -- -- -- -- -- 10.7*  HCT -- -- -- -- -- 30.6*  PLT -- -- -- -- -- 141*  APTT -- -- -- -- -- --  LABPROT -- -- 54.8* 49.0* -- --  INR -- -- 6.07* 5.26* -- --  HEPARINUNFRC -- -- -- -- -- --  CREATININE -- -- 1.14* -- -- 1.02  CKTOTAL 22 20 -- -- 21 --  CKMB 1.3 1.2 -- -- 1.3 --  TROPONINI 0.32* <0.30 -- -- 0.63* --    Estimated Creatinine Clearance: 32 ml/min (by C-G formula based on Cr of 1.14).   Medications:  Scheduled:    . amLODipine  5 mg Oral Daily  . aspirin EC  81 mg Oral Daily  . atorvastatin  20 mg Oral q1800  . carbamazepine  100 mg Oral BID  . carvedilol  9.375 mg Oral BID WC  . coumadin book   Does not apply Once  . doxycycline  100 mg Oral Q12H  . furosemide  40 mg Intravenous BID  . loratadine  10 mg Oral Daily  . losartan  100 mg Oral Daily  . mupirocin cream   Topical TID  . potassium chloride  20 mEq Oral BID  . tiotropium  18 mcg Inhalation Daily  . warfarin   Does not apply Once  . DISCONTD: sodium chloride   Intravenous STAT  . DISCONTD: carbamazepine  100 mg Oral BID  . DISCONTD: losartan  100 mg Oral Daily  . DISCONTD: sodium chloride  3 mL Intravenous Q12H  . DISCONTD: sulfamethoxazole-trimethoprim  1 tablet Oral Q12H  . DISCONTD: sulfamethoxazole-trimethoprim  1 tablet Oral BID    Assessment: 78 yo female with AFib who has been receiving Septra at home for an infection that developed after skin CA excision. Septra has a  significant interaction with Coumadin which may increase the Coumadin effect; her INR on admission was 5.26. Coumadin was held last night, however INR is still supratherapeutic at 6.07 today. This is likely due to receiving Septra and doxycycline yesterday which can both increase the INR. No bleeding problems noted.   Goal of Therapy:  INR 2-3 Monitor platelets by anticoagulation protocol: Yes   Plan:  1. Hold coumadin tonight 2. Check PT/INR daily 3. Monitor CBC and signs/symptoms of bleeding  Lillia Pauls, PharmD Clinical Pharmacist Pager: [phone] Phone: [phone] 06/30/2012 11:21 AM

## 2012-06-30 NOTE — Progress Notes (Signed)
[facility] PROGRESS NOTE  Angela Franco ZOX:[id number] DOB: 08-02-1929 DOA: 06/29/2012 PCP: No primary provider on file. None in [place]. Former PCP [name], MD [place], [place]  **See paper records in back of chart (echo, EKG)**  Assessment/Plan: 1. Hyponatremia: No significant change. Mildly symptomatic. Most likely secondary to Tegretol, possibly complicated by recent addition of Trazodone. Discontinue trazodone. Fluid restriction. Patient has severe pain when taken off Tegretol. 2. Acute systolic heart failure: Improved. Complicated by severe pulmonary hypertension. Mildly symptomatic. Continue Lasix. 3. Severe pulmonary hypertension: Etiology unclear. No history to suggest VTE (INR >5 on admission). Will discuss with PCP 7/29 re: any other investigation already performed. 4. Elevated troponin/chest discomfort: Most likely related to acute heart failure. However, patient notes constant back and chest discomfort present approximately 24 hours. Troponin labile with normal CKMB. EKG without significant change compared to old. ACS is thought less likely, but given the above, and the suggestion of likely ischemia by 2-d echocardiogram, will consult cardiology. Continue ASA, Coreg. 5. Normocytic anemia: Stable. 6. Atrial fibrillation: Continuecarvedilol, losartan. Warfarin on hold. 7. Warfarin-induced coagulopathy: INR high, without bleeding. Probably high secondary to recent start of Bactrim. Monitor. 8. History of cellulitis: Post procedure infection of the nose. Continue abx. Appears to be improving. 9. Trigeminal neuralgia: Continue carbamazepine--patient refuses to discontinue and other agents have been ineffective-- resulting in excruciating pain.  Discussed with daughter at beside.  Code Status: DNR Family CommunicationLuciana Franco (Daughter) [phone] [phone] Disposition Plan: Home when improved  Angela Sacks, MD  [facility] Pager  [phone]. If 7PM-7AM, please contact night-coverage at [email or web address], password [facility] 06/30/2012, 9:51 AM  LOS: 1 day   Brief narrative: 78 year old woman with history of hyponatremia (on Carbamazepine) last hospitalized January of this year for the same (responded to fluid restriction) presented today with 1-2 week history of gradual decline, lethargy, some confusion and increasing lower extremity edema. She reports she is chronic hyponatremia but cannot recall her baseline. She refuses to discontinue carbamazepine because of intractable pain without it. She has been told that she is "right heart failure" was not been on diuretics because of her history of hyponatremia. She denies any previous cardiac history other than atrial fibrillation.   Record Review (obtained by fax from PCP's office):  05/15/12 Hemoglobin 10.9, sodium 126, chloride 88  05/15/12 2-d echocardiogram: LVEF 40-45%, RV pressure 73, severe pulmonary hypertension, moderate tricuspid regurgitation, right atrium moderate-severe dilatation, moderate anterior/anteroseptal hypokinesis.  EKG (undated): SR, LBBB, anterior infarct, old. Overall appearance similar to today's EKG.  Consultants:  Cardiology  Procedures:    HPI/Subjective: Afebrile, VSS. Chest discomfort and back pain persists. No other issues. Leg edema has improved.  Objective: Filed Vitals:   06/29/12 2220 06/30/12 0225 06/30/12 0649 06/30/12 0935  BP: 108/44 109/72 123/67 106/75  Pulse: 64 66 89 86  Temp: 98.6 F (37 C) 98.5 F (36.9 C) 97.7 F (36.5 C)   TempSrc: Oral Oral Oral   Resp: 20 20 20    Height:      Weight:   60.4 kg (133 lb 2.5 oz)   SpO2: 95% 96% 96%     Intake/Output Summary (Last 24 hours) at 06/30/12 0951 Last data filed at 06/30/12 0825  Gross per 24 hour  Intake    844 ml  Output   1350 ml  Net   -506 ml   Wt Readings from Last 3 Encounters:  06/30/12 60.4 kg (133 lb 2.5 oz)  06/28/12 60.51 kg (133 lb 6.4 oz)  Exam:   General:  Appears calm and comfortable  Cardiovascular: RRR, no m/r/g. Decreased LE edema (2+).  Respiratory: Rales noted posteriorly, normal respiratory effort, no w/r/r.  Data Reviewed: Basic Metabolic Panel:  Lab 06/30/12 1610 06/29/12 1205  NA 121* 120*  K 3.5 3.8  CL 83* 82*  CO2 25 25  GLUCOSE 110* 97  BUN 15 16  CREATININE 1.14* 1.02  CALCIUM 8.8 9.3  MG -- --  PHOS -- --   CBC:  Lab 06/29/12 1205  WBC 7.2  NEUTROABS 5.8  HGB 10.7*  HCT 30.6*  MCV 85.0  PLT 141*   Cardiac Enzymes:  Lab 06/30/12 0900 06/30/12 0132 06/29/12 1749 06/29/12 1423 06/29/12 1205  CKTOTAL 22 20 21  -- --  CKMB 1.3 1.2 1.3 -- --  CKMBINDEX -- -- -- -- --  TROPONINI 0.32* <0.30 0.63* 0.59* 0.63*   BNP (last 3 results)  Basename 06/29/12 1423  PROBNP 3820.0*   Studies:  Scheduled Meds:   . amLODipine  5 mg Oral Daily  . aspirin EC  81 mg Oral Daily  . atorvastatin  20 mg Oral q1800  . carbamazepine  100 mg Oral BID  . carvedilol  9.375 mg Oral BID WC  . coumadin book   Does not apply Once  . doxycycline  100 mg Oral Q12H  . furosemide  40 mg Intravenous BID  . loratadine  10 mg Oral Daily  . losartan  100 mg Oral Daily  . mupirocin cream   Topical TID  . potassium chloride  20 mEq Oral BID  . tiotropium  18 mcg Inhalation Daily  . warfarin   Does not apply Once  . DISCONTD: sodium chloride   Intravenous STAT  . DISCONTD: carbamazepine  100 mg Oral BID  . DISCONTD: losartan  100 mg Oral Daily  . DISCONTD: sodium chloride  3 mL Intravenous Q12H  . DISCONTD: sulfamethoxazole-trimethoprim  1 tablet Oral Q12H  . DISCONTD: sulfamethoxazole-trimethoprim  1 tablet Oral BID   Continuous Infusions:   Principal Problem:  *Hyponatremia Active Problems:  Acute systolic CHF (congestive heart failure)  Elevated troponin  Normocytic anemia  Atrial fibrillation  Trigeminal neuralgia  Moderate to severe pulmonary hypertension  Elevated INR     Angela Sacks, MD  [facility] Pager [phone]. If 7PM-7AM, please contact night-coverage at [email or web address], password [facility] 06/30/2012, 9:51 AM  LOS: 1 day   Time spent: 25 minutes

## 2012-06-30 NOTE — Progress Notes (Signed)
CRITICAL VALUE ALERT  Critical value received:  INR= 6.07  Date of notification:  06/30/12  Time of notification:  0230  Critical value read back:yes  Nurse who received alert:  R.Ladiamond Gallina RN  MD notified (1st page):  Claiborne Billings PA  Time of first page:    MD notified (2nd page):  Time of second page:  Responding MD:  Claiborne Billings PA  Time MD responded:

## 2012-06-30 NOTE — Progress Notes (Signed)
PT Cancellation Note  Treatment cancelled today due to medical issues with patient which prohibited therapy. Pt has an INR of 6.2 as well as increased troponin. Will attempt evaluation tomorrow pending medical stability.  Thanks, 06/30/2012 Milana Kidney DPT PAGER: [phone] OFFICE: [phone]     Milana Kidney 06/30/2012, 11:53 AM

## 2012-07-01 ENCOUNTER — Other Ambulatory Visit: Payer: Self-pay

## 2012-07-01 LAB — BASIC METABOLIC PANEL
CO2: 25 mEq/L (ref 19–32)
Calcium: 8.6 mg/dL (ref 8.4–10.5)
Chloride: 83 mEq/L — ABNORMAL LOW (ref 96–112)
GFR calc Af Amer: 42 mL/min — ABNORMAL LOW (ref >90)
Sodium: 119 mEq/L — CL (ref 135–145)

## 2012-07-01 LAB — PROTIME-INR
INR: 3.67 — ABNORMAL HIGH (ref 0.00–1.49)
Prothrombin Time: 37 seconds — ABNORMAL HIGH (ref 11.6–15.2)

## 2012-07-01 MED ORDER — MORPHINE SULFATE 2 MG/ML IJ SOLN
INTRAMUSCULAR | Status: AC
  Administered 2012-07-01: 2 mg via INTRAMUSCULAR
  Filled 2012-07-01: qty 1

## 2012-07-01 MED ORDER — LOSARTAN POTASSIUM 50 MG PO TABS
50.0000 mg | ORAL_TABLET | Freq: Every day | ORAL | Status: DC
  Administered 2012-07-02 – 2012-07-08 (×7): 50 mg via ORAL
  Filled 2012-07-01 (×7): qty 1

## 2012-07-01 MED ORDER — CARVEDILOL 3.125 MG PO TABS
3.1250 mg | ORAL_TABLET | Freq: Two times a day (BID) | ORAL | Status: DC
  Administered 2012-07-02 – 2012-07-04 (×5): 3.125 mg via ORAL
  Filled 2012-07-01 (×7): qty 1

## 2012-07-01 MED ORDER — TRAMADOL HCL 50 MG PO TABS
50.0000 mg | ORAL_TABLET | Freq: Four times a day (QID) | ORAL | Status: DC | PRN
  Administered 2012-07-03: 50 mg via ORAL
  Filled 2012-07-01 (×2): qty 1

## 2012-07-01 MED ORDER — MORPHINE SULFATE 2 MG/ML IJ SOLN
2.0000 mg | INTRAMUSCULAR | Status: DC | PRN
  Administered 2012-07-03 – 2012-07-06 (×2): 2 mg via INTRAVENOUS
  Filled 2012-07-01 (×2): qty 1

## 2012-07-01 MED ORDER — FUROSEMIDE 40 MG PO TABS
40.0000 mg | ORAL_TABLET | Freq: Every day | ORAL | Status: DC
  Administered 2012-07-01 – 2012-07-06 (×5): 40 mg via ORAL
  Filled 2012-07-01 (×6): qty 1

## 2012-07-01 NOTE — Progress Notes (Addendum)
ANTICOAGULATION CONSULT NOTE - Follow Up Consult  Pharmacy Consult for coumadin Indication: atrial fibrillation  Allergies  Allergen Reactions  . Penicillins Hives    Patient Measurements: Height: 5' (152.4 cm) Weight: 135 lb 2.3 oz (61.3 kg) IBW/kg (Calculated) : 45.5    Vital Signs: Temp: 98.6 F (37 C) (07/29 0657) Temp src: Oral (07/29 0657) BP: 130/78 mmHg (07/29 0657) Pulse Rate: 66  (07/29 0657)  Labs:  Angela Franco 07/01/12 0641 07/01/12 0640 06/30/12 0900 06/30/12 0132 06/30/12 0131 06/29/12 1824 06/29/12 1749 06/29/12 1205  HGB -- -- -- -- -- -- -- 10.7*  HCT -- -- -- -- -- -- -- 30.6*  PLT -- -- -- -- -- -- -- 141*  APTT -- -- -- -- -- -- -- --  LABPROT 37.0* -- -- -- 54.8* 49.0* -- --  INR 3.67* -- -- -- 6.07* 5.26* -- --  HEPARINUNFRC -- -- -- -- -- -- -- --  CREATININE 1.35* -- -- -- 1.14* -- -- 1.02  CKTOTAL -- -- 22 20 -- -- 21 --  CKMB -- -- 1.3 1.2 -- -- 1.3 --  TROPONINI -- <0.30 0.32* <0.30 -- -- -- --    Estimated Creatinine Clearance: 27.2 ml/min (by C-G formula based on Cr of 1.35).   Medications:  Prescriptions prior to admission  Medication Sig Dispense Refill  . amLODipine (NORVASC) 5 MG tablet Take 5 mg by mouth daily.      . calcium carbonate (OS-CAL) 600 MG TABS Take 600 mg by mouth 2 (two) times daily with a meal.      . carbamazepine (CARBATROL) 100 MG 12 hr capsule Take 100 mg by mouth 2 (two) times daily.      . carvedilol (COREG) 6.25 MG tablet Take 9.37 mg by mouth 2 (two) times daily with a meal.       . fexofenadine (ALLEGRA) 180 MG tablet Take 180 mg by mouth daily.      [name] levalbuterol (XOPENEX HFA) 45 MCG/ACT inhaler Inhale 1-2 puffs into the lungs every 4 (four) hours as needed for wheezing.  1 Inhaler  12  . losartan (COZAAR) 100 MG tablet Take 100 mg by mouth daily.      . mupirocin cream (BACTROBAN) 2 % Apply topically 3 (three) times daily.  15 g  0  . rosuvastatin (CRESTOR) 10 MG tablet Take 10 mg by mouth daily.      [name]  sulfamethoxazole-trimethoprim (BACTRIM DS,SEPTRA DS) 800-160 MG per tablet Take 1 tablet by mouth 2 (two) times daily. For 10 days Started 7/26      . tiotropium (SPIRIVA) 18 MCG inhalation capsule Place 18 mcg into inhaler and inhale daily.      . traZODone (DESYREL) 50 MG tablet Take 100 mg by mouth at bedtime.       [name] warfarin (COUMADIN) 5 MG tablet Take 5-7.5 mg by mouth daily. 1 tablet on Tuesday and Friday 1.5 tablet all other days       Scheduled:    . amLODipine  5 mg Oral Daily  . aspirin EC  81 mg Oral Daily  . atorvastatin  20 mg Oral q1800  . carbamazepine  100 mg Oral BID  . carvedilol  9.375 mg Oral BID WC  . doxycycline  100 mg Oral Q12H  . furosemide  40 mg Intravenous BID  . loratadine  10 mg Oral Daily  . losartan  100 mg Oral Daily  . mupirocin cream   Topical TID  . potassium  chloride  20 mEq Oral BID  . tiotropium  18 mcg Inhalation Daily  . warfarin   Does not apply Once  . Warfarin - Pharmacist Dosing Inpatient   Does not apply q1800    Assessment: Patient is a 78 y.o female with afib who has been receiving Septra at home for an infection that developed after skin CA excision.  Patient presented with supratherapeutic INR of 5.26 likely d/t  interaction between septra and coumadin.Septra has been  d/c'd  and doxycycline initiated. INR  trending down after held dose. INR 3.67 on 7/29.    Goal of Therapy:  INR 2-3 Monitor platelets by anticoagulation protocol: Yes   Plan:  1. Hold today's coumadin dose 2. Follow up INR in Am 3. Monitor for s/sx of bleeding  Bola A. Wandra Feinstein D Clinical Pharmacist [phone] Phone [phone] 07/01/2012 9:26 AM     Addum:  Coumadin will be placed on hold.  Start heparin when INR < 2.0 Talbert Cage, PharmD

## 2012-07-01 NOTE — Progress Notes (Signed)
[facility] PROGRESS NOTE  Jalena Vanderlinden ZOX:[id number] DOB: 13-May-1929 DOA: 06/29/2012 PCP: No primary provider on file. None in [place]. Former PCP [name], MD [place], [place]  Assessment/Plan: 1. Hyponatremia: No significant change. Asymptomatic. Most likely secondary to Tegretol, possibly complicated by recent addition of Trazodone. Discontinued trazodone. Fluid restriction. Patient has severe pain when taken off Tegretol and therefore refuses discontinued. Will discuss other options with nephrology tomorrow. 2. Acute systolic heart failure: I continues to improve. Complicated by severe pulmonary hypertension. Mildly symptomatic. Continue Lasix. 3. Severe pulmonary hypertension: Etiology unclear. No history to suggest VTE (INR >5 on admission). Will discuss with PCP 7/30 re: any other investigation already performed. 4. Elevated troponin/chest discomfort: Appreciate cardiology evaluation and recommendations. Continue ASA, Coreg. 5. Normocytic anemia: Stable. 6. Atrial fibrillation: Continue carvedilol, losartan. Warfarin on hold. 7. Warfarin-induced coagulopathy: INR high, without bleeding. Probably high secondary to recent start of Bactrim. Monitor. 8. History of cellulitis: Post procedure infection of the nose. Continue abx. Appears to be improving. 9. Trigeminal neuralgia: Continue carbamazepine--patient refuses to discontinue and other agents have been ineffective-- resulting in excruciating pain.  Discussed with daughter by telephone.  Code Status: DNR Family CommunicationLuciana Axe (Daughter) [phone] [phone] Disposition Plan: Home when improved  Brendia Sacks, MD  [facility] Pager [phone]. If 7PM-7AM, please contact night-coverage at [email or web address], password [facility] 07/01/2012, 6:18 PM  LOS: 2 days   Brief narrative: 78 year old woman with history of hyponatremia (on Carbamazepine) last hospitalized January of this year for the  same (responded to fluid restriction) presented today with 1-2 week history of gradual decline, lethargy, some confusion and increasing lower extremity edema. She reports she is chronic hyponatremia but cannot recall her baseline. She refuses to discontinue carbamazepine because of intractable pain without it. She has been told that she is "right heart failure" was not been on diuretics because of her history of hyponatremia. She denies any previous cardiac history other than atrial fibrillation.   Record Review (obtained by fax from PCP's office):  05/15/12 Hemoglobin 10.9, sodium 126, chloride 88  05/15/12 2-d echocardiogram: LVEF 40-45%, RV pressure 73, severe pulmonary hypertension, moderate tricuspid regurgitation, right atrium moderate-severe dilatation, moderate anterior/anteroseptal hypokinesis.  EKG (undated): SR, LBBB, anterior infarct, old. Overall appearance similar to today's EKG.  Consultants:  Cardiology  Procedures:    HPI/Subjective: Afebrile, VSS. Some chest discomfort this afternoon.  Objective: Filed Vitals:   07/01/12 0657 07/01/12 0835 07/01/12 1034 07/01/12 1350  BP: 130/78  96/44 95/47  Pulse: 66   83  Temp: 98.6 F (37 C)   97.5 F (36.4 C)  TempSrc: Oral   Oral  Resp: 20   20  Height:      Weight: 61.3 kg (135 lb 2.3 oz)     SpO2: 97% 93%  91%    Intake/Output Summary (Last 24 hours) at 07/01/12 1818 Last data filed at 07/01/12 1800  Gross per 24 hour  Intake   1500 ml  Output   1500 ml  Net      0 ml   Wt Readings from Last 3 Encounters:  07/01/12 61.3 kg (135 lb 2.3 oz)  06/28/12 60.51 kg (133 lb 6.4 oz)    Exam:   General:  Appears calm and comfortable  Cardiovascular: RRR, no m/r/g. LE edema (2+).  Respiratory: Anterior, rales normal respiratory effort, no w/r/r.  Data Reviewed: Basic Metabolic Panel:  Lab 07/01/12 1324 06/30/12 0131 06/29/12 1205  NA 119* 121* 120*  K 3.7 3.5 3.8  CL 83*  83* 82*  CO2 25 25 25   GLUCOSE 83 110*  97  BUN 20 15 16   CREATININE 1.35* 1.14* 1.02  CALCIUM 8.6 8.8 9.3  MG -- -- --  PHOS -- -- --   CBC:  Lab 06/29/12 1205  WBC 7.2  NEUTROABS 5.8  HGB 10.7*  HCT 30.6*  MCV 85.0  PLT 141*   Cardiac Enzymes:  Lab 07/01/12 0640 06/30/12 0900 06/30/12 0132 06/29/12 1749 06/29/12 1423  CKTOTAL -- 22 20 21  --  CKMB -- 1.3 1.2 1.3 --  CKMBINDEX -- -- -- -- --  TROPONINI <0.30 0.32* <0.30 0.63* 0.59*   BNP (last 3 results)  Basename 06/29/12 1423  PROBNP 3820.0*   Studies:  Scheduled Meds:    . amLODipine  5 mg Oral Daily  . aspirin EC  81 mg Oral Daily  . atorvastatin  20 mg Oral q1800  . carbamazepine  100 mg Oral BID  . carvedilol  3.125 mg Oral BID WC  . doxycycline  100 mg Oral Q12H  . furosemide  40 mg Oral Daily  . loratadine  10 mg Oral Daily  . losartan  50 mg Oral Daily  . morphine      . mupirocin cream   Topical TID  . potassium chloride  20 mEq Oral BID  . tiotropium  18 mcg Inhalation Daily  . DISCONTD: carvedilol  9.375 mg Oral BID WC  . DISCONTD: furosemide  40 mg Intravenous BID  . DISCONTD: losartan  100 mg Oral Daily  . DISCONTD: Warfarin - Pharmacist Dosing Inpatient   Does not apply q1800   Continuous Infusions:   Principal Problem:  *Systolic and diastolic CHF, acute on chronic, (BNP 3800) Active Problems:  Hyponatremia, history of chronic hyponatremia  Elevated troponin, NSTEMI vs secondary elevation from CHF  Normocytic anemia  Atrial fibrillation, chronic  Trigeminal neuralgia  Moderate to severe pulmonary hypertension, PA pressure in 70s June 2013  Elevated INR  Cardiomyopathy, EF 40-45% 2D June 2013, possibly ischemic with AS WMA  LBBB (left bundle branch block)  Chronic anticoagulation  HTN (hypertension)     Brendia Sacks, MD  [facility] Pager [phone]. If 7PM-7AM, please contact night-coverage at [email or web address], password [facility] 07/01/2012, 6:18 PM  LOS: 2 days   Time spent: 25 minutes

## 2012-07-01 NOTE — Progress Notes (Signed)
PT Cancellation Note  Treatment cancelled today due to medical issues with patient which prohibited therapy. Pt currently having CP awaiting EKG and RN in room to assess pt and deferring therapy at this time. Will attempt at later date.   Delaney Meigs, PT [phone]

## 2012-07-01 NOTE — Progress Notes (Signed)
Subjective:  Much less SOB. Her edema is improving as well.  Objective:  Vital Signs in the last 24 hours: Temp:  [98.2 F (36.8 C)-98.6 F (37 C)] 98.6 F (37 C) (07/29 0657) Pulse Rate:  [66-94] 66  (07/29 0657) Resp:  [20] 20  (07/29 0657) BP: (85-130)/(33-79) 96/44 mmHg (07/29 1034) SpO2:  [93 %-97 %] 93 % (07/29 0835) Weight:  [61.3 kg (135 lb 2.3 oz)] 61.3 kg (135 lb 2.3 oz) (07/29 0657)  Intake/Output from previous day:  Intake/Output Summary (Last 24 hours) at 07/01/12 1122 Last data filed at 07/01/12 1016  Gross per 24 hour  Intake   1140 ml  Output   1475 ml  Net   -335 ml      . amLODipine  5 mg Oral Daily  . aspirin EC  81 mg Oral Daily  . atorvastatin  20 mg Oral q1800  . carbamazepine  100 mg Oral BID  . carvedilol  9.375 mg Oral BID WC  . doxycycline  100 mg Oral Q12H  . furosemide  40 mg Intravenous BID  . loratadine  10 mg Oral Daily  . losartan  100 mg Oral Daily  . mupirocin cream   Topical TID  . potassium chloride  20 mEq Oral BID  . tiotropium  18 mcg Inhalation Daily  . Warfarin - Pharmacist Dosing Inpatient   Does not apply q1800    Physical Exam: General appearance: alert, cooperative and no distress Lungs: few basilar rales bilat Heart: irregularly irregular rhythm Abd: soft BS+ No edema   Rate: 80  Rhythm: atrial fibrillation and RBBB  Lab Results:  Basename 06/29/12 1205  WBC 7.2  HGB 10.7*  PLT 141*    Basename 07/01/12 0641 06/30/12 0131  NA 119* 121*  K 3.7 3.5  CL 83* 83*  CO2 25 25  GLUCOSE 83 110*  BUN 20 15  CREATININE 1.35* 1.14*    Basename 07/01/12 0640 06/30/12 0900  TROPONINI <0.30 0.32*   Hepatic Function Panel No results found for this basename: PROT,ALBUMIN,AST,ALT,ALKPHOS,BILITOT,BILIDIR,IBILI in the last 72 hours No results found for this basename: CHOL in the last 72 hours  Basename 07/01/12 0641  INR 3.67*    Imaging: Dg Chest 2 View  06/29/2012  *RADIOLOGY REPORT*  Clinical Data: Fever,  leg swelling  CHEST - 2 VIEW  Comparison: None.  Findings: Cardiomediastinal silhouette is unremarkable.  No acute infiltrate or pulmonary edema.  Mild atelectasis right base. Nodular density right base may represent nipple shadow.  Repeat frontal view with nipple markers is recommended for confirmation. Osteopenia and mild degenerative changes thoracic spine.  IMPRESSION: No acute infiltrate or pulmonary edema. Mild atelectasis right base.  Nodular density right base may represent nipple shadow. Repeat frontal view with nipple markers is recommended for confirmation.  Original Report Authenticated By: Natasha Mead, M.D.    Cardiac Studies:  Assessment/Plan:   Principal Problem:  *Systolic and diastolic CHF, acute on chronic, (BNP 3800) Active Problems:  Hyponatremia, history of chronic hyponatremia  Elevated troponin, NSTEMI vs secondary elevation from CHF  Moderate to severe pulmonary hypertension, PA pressure in 70s June 2013  Elevated INR  Cardiomyopathy, EF 40-45% 2D June 2013, possibly ischemic with AS WMA  Normocytic anemia  Atrial fibrillation, chronic  Trigeminal neuralgia  LBBB (left bundle branch block)  Chronic anticoagulation  HTN (hypertension)  Plan- Despite I/O and wgt she seems to be improving with Lasix. Her B/P is low today and Cozaar and Norvasc were held. She  may not be able to tolerate Coreg at current dose. Continue IV lasix. ? Rt and Lt cath later this week vs Myoview. MD to see.    Corine Shelter PA-C 07/01/2012, 11:22 AM    Patient seen and examined. Agree with assessment and plan. No further chest pain. INR 3.67. Discussed situation at lenghth with patient and daughter. Naida Sleight prefer an aggressive approach. In light of echo findings will therefore plan R &L heart cath when INR <1.6/1.7; possible Thursday with me or sooner with colleagues if INR OK. Start heparin when INR less than 2. Change lasix to 40 mg orally daily. Hold ARB day of cath and day after.   Lennette Bihari, MD, [facility] 07/01/2012 12:48 PM

## 2012-07-02 DIAGNOSIS — I2789 Other specified pulmonary heart diseases: Secondary | ICD-10-CM

## 2012-07-02 LAB — BASIC METABOLIC PANEL
BUN: 26 mg/dL — ABNORMAL HIGH (ref 6–23)
Calcium: 8.7 mg/dL (ref 8.4–10.5)
GFR calc Af Amer: 40 mL/min — ABNORMAL LOW (ref >90)
GFR calc non Af Amer: 35 mL/min — ABNORMAL LOW (ref >90)
Glucose, Bld: 91 mg/dL (ref 70–99)
Potassium: 3.7 mEq/L (ref 3.5–5.1)

## 2012-07-02 MED ORDER — SENNA 8.6 MG PO TABS
1.0000 | ORAL_TABLET | Freq: Every day | ORAL | Status: DC | PRN
  Administered 2012-07-03: 8.6 mg via ORAL
  Filled 2012-07-02: qty 1

## 2012-07-02 MED ORDER — HEPARIN (PORCINE) IN NACL 100-0.45 UNIT/ML-% IJ SOLN
800.0000 [IU]/h | INTRAMUSCULAR | Status: DC
  Administered 2012-07-02: 800 [IU]/h via INTRAVENOUS
  Filled 2012-07-02: qty 250

## 2012-07-02 MED ORDER — DEMECLOCYCLINE HCL 150 MG PO TABS
300.0000 mg | ORAL_TABLET | Freq: Two times a day (BID) | ORAL | Status: DC
  Administered 2012-07-02 – 2012-07-04 (×5): 300 mg via ORAL
  Filled 2012-07-02 (×6): qty 2

## 2012-07-02 MED ORDER — POLYETHYLENE GLYCOL 3350 17 G PO PACK
17.0000 g | PACK | Freq: Every day | ORAL | Status: DC
  Administered 2012-07-02 – 2012-07-08 (×5): 17 g via ORAL
  Filled 2012-07-02 (×7): qty 1

## 2012-07-02 MED ORDER — HEPARIN (PORCINE) IN NACL 100-0.45 UNIT/ML-% IJ SOLN
700.0000 [IU]/h | INTRAMUSCULAR | Status: DC
  Administered 2012-07-03 (×2): 800 [IU]/h via INTRAVENOUS
  Filled 2012-07-02 (×3): qty 250

## 2012-07-02 MED ORDER — DOCUSATE SODIUM 100 MG PO CAPS
100.0000 mg | ORAL_CAPSULE | Freq: Two times a day (BID) | ORAL | Status: DC
  Administered 2012-07-02 – 2012-07-08 (×11): 100 mg via ORAL
  Filled 2012-07-02 (×13): qty 1

## 2012-07-02 NOTE — Progress Notes (Signed)
[facility] PROGRESS NOTE  Angela Franco ZOX:[id number] DOB: 03/09/1929 DOA: 06/29/2012 PCP: No primary provider on file. None in [place]. Former PCP [name], MD [place], [place]  Assessment/Plan: 1. Hyponatremia: Per conversation with nephrology will start the demeclocycline. No significant change. Asymptomatic. Most likely secondary to Tegretol, possibly complicated by recent addition of Trazodone. Discontinued trazodone. Continue Fluid restriction. Patient has severe pain when taken off Tegretol and therefore refuses discontinued.  2. Acute systolic heart failure: Continues to improve. Complicated by severe pulmonary hypertension. Mildly symptomatic. Continue Lasix. 3. Severe pulmonary hypertension: Etiology unclear. No history to suggest VTE (INR >5 on admission).  4. Elevated troponin/chest discomfort: Appreciate cardiology evaluation and recommendations. Continue ASA, Coreg. 5. Normocytic anemia: Stable. 6. Atrial fibrillation: Continue carvedilol, losartan. Warfarin on hold. 7. Warfarin-induced coagulopathy: INR high on admission, without bleeding. Probably high secondary to recent start of Bactrim as an outpatient.  8. History of cellulitis: Post procedure infection of the nose.  Appears resolved. Discontinue antibiotics. 9. Trigeminal neuralgia: Continue carbamazepine--patient refuses to discontinue and other agents have been ineffective-- resulting in excruciating pain.  Code Status: DNR Family CommunicationLuciana Axe (Daughter) [phone] [phone] Disposition Plan: Home when improved  Brendia Sacks, MD  [facility] Pager [phone]. If 7PM-7AM, please contact night-coverage at [email or web address], password [facility] 07/02/2012, 2:57 PM  LOS: 3 days   Brief narrative: 78 year old woman with history of hyponatremia (on Carbamazepine) last hospitalized January of this year for the same (responded to fluid restriction) presented today with 1-2 week  history of gradual decline, lethargy, some confusion and increasing lower extremity edema. She reports she is chronic hyponatremia but cannot recall her baseline. She refuses to discontinue carbamazepine because of intractable pain without it. She has been told that she is "right heart failure" was not been on diuretics because of her history of hyponatremia. She denies any previous cardiac history other than atrial fibrillation.   Record Review (obtained by fax from PCP's office):  05/15/12 Hemoglobin 10.9, sodium 126, chloride 88  05/15/12 2-d echocardiogram: LVEF 40-45%, RV pressure 73, severe pulmonary hypertension, moderate tricuspid regurgitation, right atrium moderate-severe dilatation, moderate anterior/anteroseptal hypokinesis.  EKG (undated): SR, LBBB, anterior infarct, old. Overall appearance similar to today's EKG.  Consultants:  Cardiology  Physical therapy--no follow-up  Procedures:  R/L heart cath pending  HPI/Subjective: Afebrile, VSS. Feels somewhat better.  Objective: Filed Vitals:   07/02/12 0458 07/02/12 0858 07/02/12 1038 07/02/12 1318  BP: 127/75  138/72 100/60  Pulse: 97   82  Temp: 97.9 F (36.6 C)   97.2 F (36.2 C)  TempSrc: Oral   Oral  Resp: 18   19  Height:      Weight: 57.924 kg (127 lb 11.2 oz)     SpO2: 100% 100%  91%    Intake/Output Summary (Last 24 hours) at 07/02/12 1457 Last data filed at 07/02/12 1300  Gross per 24 hour  Intake    590 ml  Output   1200 ml  Net   -610 ml   Wt Readings from Last 3 Encounters:  07/02/12 57.924 kg (127 lb 11.2 oz)  06/28/12 60.51 kg (133 lb 6.4 oz)    Exam:   General:  Appears calm and comfortable  Cardiovascular: RRR, no m/r/g. LE edema (1+).  Respiratory: Anterior, rales normal respiratory effort, no w/r/r.  Data Reviewed: Basic Metabolic Panel:  Lab 07/02/12 1324 07/01/12 0641 06/30/12 0131 06/29/12 1205  NA 121* 119* 121* 120*  K 3.7 3.7 3.5 3.8  CL 84* 83* 83* 82*  CO2 27 25 25 25     GLUCOSE 91 83 110* 97  BUN 26* 20 15 16   CREATININE 1.39* 1.35* 1.14* 1.02  CALCIUM 8.7 8.6 8.8 9.3  MG -- -- -- --  PHOS -- -- -- --   CBC:  Lab 06/29/12 1205  WBC 7.2  NEUTROABS 5.8  HGB 10.7*  HCT 30.6*  MCV 85.0  PLT 141*   Cardiac Enzymes:  Lab 07/01/12 0640 06/30/12 0900 06/30/12 0132 06/29/12 1749 06/29/12 1423  CKTOTAL -- 22 20 21  --  CKMB -- 1.3 1.2 1.3 --  CKMBINDEX -- -- -- -- --  TROPONINI <0.30 0.32* <0.30 0.63* 0.59*   BNP (last 3 results)  Basename 06/29/12 1423  PROBNP 3820.0*   Studies:  Scheduled Meds:    . amLODipine  5 mg Oral Daily  . aspirin EC  81 mg Oral Daily  . atorvastatin  20 mg Oral q1800  . carbamazepine  100 mg Oral BID  . carvedilol  3.125 mg Oral BID WC  . demeclocycline  300 mg Oral Q12H  . furosemide  40 mg Oral Daily  . loratadine  10 mg Oral Daily  . losartan  50 mg Oral Daily  . mupirocin cream   Topical TID  . potassium chloride  20 mEq Oral BID  . tiotropium  18 mcg Inhalation Daily  . DISCONTD: carvedilol  9.375 mg Oral BID WC  . DISCONTD: doxycycline  100 mg Oral Q12H  . DISCONTD: losartan  100 mg Oral Daily   Continuous Infusions:    . heparin 800 Units/hr (07/02/12 1053)    Principal Problem:  *Systolic and diastolic CHF, acute on chronic, (BNP 3800) Active Problems:  Hyponatremia, history of chronic hyponatremia  Elevated troponin, NSTEMI vs secondary elevation from CHF  Normocytic anemia  Atrial fibrillation, chronic  Trigeminal neuralgia  Moderate to severe pulmonary hypertension, PA pressure in 70s June 2013  Elevated INR  Cardiomyopathy, EF 40-45% 2D June 2013, possibly ischemic with AS WMA  LBBB (left bundle branch block)  Chronic anticoagulation  HTN (hypertension)     Brendia Sacks, MD  [facility] Pager [phone]. If 7PM-7AM, please contact night-coverage at [email or web address], password [facility] 07/02/2012, 2:57 PM  LOS: 3 days   Time spent: 15 minutes

## 2012-07-02 NOTE — Progress Notes (Signed)
Heparin per Pharmacy  Indication: atrial fibrillation  Heparin level = 0.17 on 800 units/hr Goal heparin level = 0.3-0.7  Heparin level is subtherapeutic. Will increase the rate to 900 units/hr and check the heparin level in the AM.  Cardell Peach, PharmD

## 2012-07-02 NOTE — Evaluation (Signed)
Physical Therapy Evaluation Patient Details Name: Angela Franco MRN: [id number] DOB: [birth year] Today's Date: 07/02/2012 Time: 3086-5784 PT Time Calculation (min): 24 min  PT Assessment / Plan / Recommendation Clinical Impression  pt adm with hyponatremia and elevated INR, both resulting from med interactions/side effects. Pt has experienced chest pain which has previously limited her activity (none with PT this date). No skilled PT needs identified. Will refer to Mobility Team for continued ambulation to regain endurance.    PT Assessment  Patent does not need any further PT services    Follow Up Recommendations  No PT follow up;Supervision - Intermittent    Barriers to Discharge        Equipment Recommendations  None recommended by PT    Recommendations for Other Services Other (comment) (Mobility Team)   Frequency      Precautions / Restrictions     Pertinent Vitals/Pain 95-97% on RA; HR 86-112      Mobility  Bed Mobility Bed Mobility: Not assessed Transfers Transfers: Sit to Stand;Stand to Sit Sit to Stand: 6: Modified independent (Device/Increase time);With upper extremity assist;With armrests;From chair/3-in-1;From toilet Stand to Sit: 6: Modified independent (Device/Increase time);With upper extremity assist;To chair/3-in-1;To toilet Ambulation/Gait Ambulation/Gait Assistance: 5: Supervision Ambulation Distance (Feet): 140 Feet Assistive device: None Ambulation/Gait Assistance Details: Pt feels a bit weak; initially with no arm swing and "guarded" posture, progressing to more normalized gait, although with decr velocity and wide base of support. No loss of balance or deviations from path noted. Gait Pattern: Step-through pattern;Decreased stride length;Wide base of support    Exercises     PT Diagnosis:    PT Problem List:   PT Treatment Interventions:     PT Goals    Visit Information  Last PT Received On: 07/02/12 Assistance Needed: +1    Subjective  Data  Subjective: Pt reports she is very independent at home, yet daughter is protective and neither of them want her to fall. Denies h/o near falls or falls. Patient Stated Goal: Return home   Prior Functioning  Home Living Lives With: Daughter;Other (Comment) (son in law) Available Help at Discharge: Family;Available PRN/intermittently (daughter works part-time in summer (school admin)) Type of Home: House Home Access: Stairs to enter [employer] of Steps: 3 Entrance Stairs-Rails: None Home Layout: Two level;Able to live on main level with bedroom/bathroom Bathroom Shower/Tub: Other (comment) (pt reports she doesn't use shower, she does bath at sink) Home Adaptive Equipment: Walker - rolling (was her husband's) Prior Function Level of Independence: Needs assistance Needs Assistance: Bathing (stairs due to no rails; pt does light housekeeping) Bath: Supervision/set-up (daughter does it, although pt capable) Able to Take Stairs?: Yes Communication Communication: No difficulties    Cognition  Overall Cognitive Status: Appears within functional limits for tasks assessed/performed Arousal/Alertness: Awake/alert Orientation Level: Appears intact for tasks assessed Behavior During Session: [facility] for tasks performed    Extremity/Trunk Assessment Right Upper Extremity Assessment RUE ROM/Strength/Tone: [facility] for tasks assessed Left Upper Extremity Assessment LUE ROM/Strength/Tone: [facility] for tasks assessed Right Lower Extremity Assessment RLE ROM/Strength/Tone: [facility] for tasks assessed Left Lower Extremity Assessment LLE ROM/Strength/Tone: [facility] for tasks assessed Trunk Assessment Trunk Assessment: Normal   Balance Balance Balance Assessed: No (MD in to assess pt, further eval deferred)  End of Session PT - End of Session Activity Tolerance: Patient tolerated treatment well Patient left: in chair;with call bell/phone within reach Nurse Communication: Mobility status  GP      Angela Franco 07/02/2012, 11:09 AM  Pager [phone]

## 2012-07-02 NOTE — Progress Notes (Signed)
Pt. Had episode of vomiting after taking declomycin and half of her potassium.  Heart rate elevated to 140s not sustained.   After receiving dose of zofran, pt. Able to take rest of scheduled medication.   Will continue to monitor patient.

## 2012-07-02 NOTE — Progress Notes (Signed)
Pt. Seen and examined. Agree with the NP/PA-C note as written.  Diuresing well, down 3 KGs. INR 2.0 today, will start heparin. Plan for [facility] on Thursday with Dr. Tresa Endo.  Chrystie Nose, MD, [facility] Attending Cardiologist [facility]

## 2012-07-02 NOTE — Progress Notes (Signed)
Pt. Is sitting on side of bed complaining of shortness of breath.  O2 sats checked and were 89-90% on room air.  2L humidified O2 applied to patient and O2 sats up to 96%.  Will continue to monitor patient.

## 2012-07-02 NOTE — Progress Notes (Signed)
ANTICOAGULATION CONSULT NOTE - Follow Up Consult  Pharmacy Consult for heparin Indication: atrial fibrillation  Allergies  Allergen Reactions  . Penicillins Hives    Patient Measurements: Height: 5' (152.4 cm) Weight: 127 lb 11.2 oz (57.924 kg) (scale b) IBW/kg (Calculated) : 45.5    Vital Signs: Temp: 97.9 F (36.6 C) (07/30 0458) Temp src: Oral (07/30 0458) BP: 127/75 mmHg (07/30 0458) Pulse Rate: 97  (07/30 0458)  Labs:  Alvira Philips 07/02/12 0622 07/01/12 0641 07/01/12 0640 06/30/12 0900 06/30/12 0132 06/30/12 0131 06/29/12 1749 06/29/12 1205  HGB -- -- -- -- -- -- -- 10.7*  HCT -- -- -- -- -- -- -- 30.6*  PLT -- -- -- -- -- -- -- 141*  APTT -- -- -- -- -- -- -- --  LABPROT 23.0* 37.0* -- -- -- 54.8* -- --  INR 2.00* 3.67* -- -- -- 6.07* -- --  HEPARINUNFRC -- -- -- -- -- -- -- --  CREATININE 1.39* 1.35* -- -- -- 1.14* -- --  CKTOTAL -- -- -- 22 20 -- 21 --  CKMB -- -- -- 1.3 1.2 -- 1.3 --  TROPONINI -- -- <0.30 0.32* <0.30 -- -- --    Estimated Creatinine Clearance: 25.7 ml/min (by C-G formula based on Cr of 1.39).   Medications:  Prescriptions prior to admission  Medication Sig Dispense Refill  . amLODipine (NORVASC) 5 MG tablet Take 5 mg by mouth daily.      . calcium carbonate (OS-CAL) 600 MG TABS Take 600 mg by mouth 2 (two) times daily with a meal.      . carbamazepine (CARBATROL) 100 MG 12 hr capsule Take 100 mg by mouth 2 (two) times daily.      . carvedilol (COREG) 6.25 MG tablet Take 9.37 mg by mouth 2 (two) times daily with a meal.       . fexofenadine (ALLEGRA) 180 MG tablet Take 180 mg by mouth daily.      [name] levalbuterol (XOPENEX HFA) 45 MCG/ACT inhaler Inhale 1-2 puffs into the lungs every 4 (four) hours as needed for wheezing.  1 Inhaler  12  . losartan (COZAAR) 100 MG tablet Take 100 mg by mouth daily.      . mupirocin cream (BACTROBAN) 2 % Apply topically 3 (three) times daily.  15 g  0  . rosuvastatin (CRESTOR) 10 MG tablet Take 10 mg by mouth  daily.      [name] sulfamethoxazole-trimethoprim (BACTRIM DS,SEPTRA DS) 800-160 MG per tablet Take 1 tablet by mouth 2 (two) times daily. For 10 days Started 7/26      . tiotropium (SPIRIVA) 18 MCG inhalation capsule Place 18 mcg into inhaler and inhale daily.      . traZODone (DESYREL) 50 MG tablet Take 100 mg by mouth at bedtime.       [name] warfarin (COUMADIN) 5 MG tablet Take 5-7.5 mg by mouth daily. 1 tablet on Tuesday and Friday 1.5 tablet all other days       Scheduled:     . amLODipine  5 mg Oral Daily  . aspirin EC  81 mg Oral Daily  . atorvastatin  20 mg Oral q1800  . carbamazepine  100 mg Oral BID  . carvedilol  3.125 mg Oral BID WC  . doxycycline  100 mg Oral Q12H  . furosemide  40 mg Oral Daily  . loratadine  10 mg Oral Daily  . losartan  50 mg Oral Daily  . morphine      .  mupirocin cream   Topical TID  . potassium chloride  20 mEq Oral BID  . tiotropium  18 mcg Inhalation Daily  . DISCONTD: carvedilol  9.375 mg Oral BID WC  . DISCONTD: furosemide  40 mg Intravenous BID  . DISCONTD: losartan  100 mg Oral Daily  . DISCONTD: Warfarin - Pharmacist Dosing Inpatient   Does not apply q1800    Assessment: Patient is a 78 y.o female with afib who has been receiving Septra at home for an infection that developed after skin CA excision.  Patient presented with supratherapeutic INR of 5.26 likely d/t  interaction between septra and coumadin.Septra now discontinued.  Coumadin on hold for cath and heparin to start today as INR 2.0.    Goal of Therapy:  Heparin level 0.3-0.7 units/ml Monitor platelets by anticoagulation protocol: Yes   Plan:  1. Start heparin with no bolus at 800 units/hr 2. Check heparin level 8 hours after start then daily while on heparin. 3. Monitor for s/sx of bleeding 4.  Check PTLC daily.  Thanks for allowing pharmacy to be a part of this patient's care.  Talbert Cage, PharmD Clinical Pharmacist, [phone]

## 2012-07-02 NOTE — Progress Notes (Signed)
[facility]  Subjective: Feeling better.  Breathing improved.  Objective: Vital signs in last 24 hours: Temp:  [97.5 F (36.4 C)-97.9 F (36.6 C)] 97.9 F (36.6 C) (07/30 0458) Pulse Rate:  [83-97] 97  (07/30 0458) Resp:  [18-20] 18  (07/30 0458) BP: (95-127)/(44-75) 127/75 mmHg (07/30 0458) SpO2:  [91 %-100 %] 100 % (07/30 0858) Weight:  [57.924 kg (127 lb 11.2 oz)] 57.924 kg (127 lb 11.2 oz) (07/30 0458) Last BM Date: 07/01/12  Intake/Output from previous day: 07/29 0701 - 07/30 0700 In: 1020 [P.O.:1020] Out: 1450 [Urine:1450] Intake/Output this shift: Total I/O In: 230 [P.O.:230] Out: -   Medications Current Facility-Administered Medications  Medication Dose Route Frequency Provider Last Rate Last Dose  . acetaminophen (TYLENOL) tablet 650 mg  650 mg Oral Q4H PRN Standley Brooking, MD   650 mg at 07/02/12 0331  . amLODipine (NORVASC) tablet 5 mg  5 mg Oral Daily Nada Boozer, NP   5 mg at 06/30/12 0935  . aspirin EC tablet 81 mg  81 mg Oral Daily Standley Brooking, MD   81 mg at 07/01/12 1041  . atorvastatin (LIPITOR) tablet 20 mg  20 mg Oral q1800 Standley Brooking, MD   20 mg at 07/01/12 1725  . carbamazepine (TEGRETOL XR) 12 hr tablet 100 mg  100 mg Oral BID Standley Brooking, MD   100 mg at 07/01/12 2218  . carvedilol (COREG) tablet 3.125 mg  3.125 mg Oral BID WC Nada Boozer, NP   3.125 mg at 07/02/12 1610  . demeclocycline (DECLOMYCIN) tablet 300 mg  300 mg Oral Q12H Standley Brooking, MD      . furosemide (LASIX) tablet 40 mg  40 mg Oral Daily Lennette Bihari, MD   40 mg at 07/01/12 1502  . guaiFENesin-dextromethorphan (ROBITUSSIN DM) 100-10 MG/5ML syrup 5 mL  5 mL Oral Q4H PRN Rolan Lipa, NP   5 mL at 07/01/12 2057  . heparin ADULT infusion 100 units/mL (25000 units/250 mL)  800 Units/hr Intravenous Continuous Lora Poteet Seay, PHARMD      . loratadine (CLARITIN) tablet 10 mg  10 mg Oral Daily Standley Brooking, MD   10 mg  at 07/01/12 1043  . losartan (COZAAR) tablet 50 mg  50 mg Oral Daily Nada Boozer, NP      . morphine 2 MG/ML injection 2 mg  2 mg Intravenous Q3H PRN Abelino Derrick, PA      . morphine 2 MG/ML injection        2 mg at 07/01/12 1406  . mupirocin cream (BACTROBAN) 2 %   Topical TID Standley Brooking, MD      . ondansetron [facility]) injection 4 mg  4 mg Intravenous Q6H PRN Standley Brooking, MD   4 mg at 06/30/12 1038  . potassium chloride SA (K-DUR,KLOR-CON) CR tablet 20 mEq  20 mEq Oral BID Standley Brooking, MD   20 mEq at 07/01/12 2218  . tiotropium (SPIRIVA) inhalation capsule 18 mcg  18 mcg Inhalation Daily Standley Brooking, MD   18 mcg at 07/02/12 0857  . traMADol (ULTRAM) tablet 50 mg  50 mg Oral Q6H PRN Abelino Derrick, PA      . zolpidem (AMBIEN) tablet 5 mg  5 mg Oral QHS PRN Standley Brooking, MD   5 mg at 06/29/12 2208  . DISCONTD: carvedilol (COREG) tablet 9.375 mg  9.375 mg Oral BID WC Standley Brooking,  MD   9.375 mg at 07/01/12 0654  . DISCONTD: doxycycline (VIBRA-TABS) tablet 100 mg  100 mg Oral Q12H Standley Brooking, MD   100 mg at 07/01/12 2218  . DISCONTD: furosemide (LASIX) injection 40 mg  40 mg Intravenous BID Standley Brooking, MD   40 mg at 07/01/12 4098  . DISCONTD: losartan (COZAAR) tablet 100 mg  100 mg Oral Daily Standley Brooking, MD   100 mg at 06/30/12 1191  . DISCONTD: Warfarin - Pharmacist Dosing Inpatient   Does not apply q1800 Mathis Fare, PHARMD        PE: General appearance: alert, cooperative and no distress Lungs: clear to auscultation bilaterally Heart: irregularly irregular rhythm Extremities: No LEE Pulses: 2+ and symmetric Skin: Warm and dry. Neurologic: Grossly normal  Lab Results:   Basename 06/29/12 1205  WBC 7.2  HGB 10.7*  HCT 30.6*  PLT 141*   BMET  Basename 07/02/12 0622 07/01/12 0641 06/30/12 0131  NA 121* 119* 121*  K 3.7 3.7 3.5  CL 84* 83* 83*  CO2 27 25 25   GLUCOSE 91 83 110*  BUN 26* 20 15  CREATININE 1.39* 1.35*  1.14*  CALCIUM 8.7 8.6 8.8   PT/INR  Basename 07/02/12 0622 07/01/12 0641 06/30/12 0131  LABPROT 23.0* 37.0* 54.8*  INR 2.00* 3.67* 6.07*    Assessment/Plan  Principal Problem:  *Systolic and diastolic CHF, acute on chronic, (BNP 3800) Active Problems:  Hyponatremia, history of chronic hyponatremia  Elevated troponin, NSTEMI vs secondary elevation from CHF  Normocytic anemia  Atrial fibrillation, chronic  Trigeminal neuralgia  Moderate to severe pulmonary hypertension, PA pressure in 70s June 2013  Elevated INR  Cardiomyopathy, EF 40-45% 2D June 2013, possibly ischemic with AS WMA  LBBB (left bundle branch block)  Chronic anticoagulation  HTN (hypertension)  Plan: Appears euvolemic.   Right and left heart cath Thursday.  INR still therapeutic at 2.0 today.  Net fluid negative 1L in the last two days.  Wt 60.51 KG >>>57.9Kg.  BP improved this morning.  Will start heparin today.  Lasix changed to PO yesterday.  Ambulating in the hall with PT without difficulty.   LOS: 3 days    Lorris Carducci 07/02/2012 10:13 AM

## 2012-07-02 NOTE — Progress Notes (Signed)
Pt asymptomatic with having Vtach when ambulating from bathroom.  Rate been 160-180. Per strip rate.  HR 102.  Asymptomatic.  B. Leron Croak made aware again.  No new order given.   Will cont. To monitor.  Amanda Pea, RN>

## 2012-07-02 NOTE — Progress Notes (Signed)
Pt HR100, A Fib Tach100+ .  Pt asymptomatic.  Occurred with ambulation to bathroom.  Penni Bombard PA notified and came up  To floor to talk with pt.  Pt resting quietly in chair.  Daughter at the bedside.  Amanda Pea, Charity fundraiser.

## 2012-07-03 DIAGNOSIS — I5021 Acute systolic (congestive) heart failure: Secondary | ICD-10-CM

## 2012-07-03 DIAGNOSIS — E871 Hypo-osmolality and hyponatremia: Secondary | ICD-10-CM

## 2012-07-03 DIAGNOSIS — I4891 Unspecified atrial fibrillation: Secondary | ICD-10-CM

## 2012-07-03 LAB — HEPARIN LEVEL (UNFRACTIONATED): Heparin Unfractionated: 0.76 IU/mL — ABNORMAL HIGH (ref 0.30–0.70)

## 2012-07-03 LAB — CBC
MCH: 30.3 pg (ref 26.0–34.0)
Platelets: 167 10*3/uL (ref 150–400)
RBC: 3.47 MIL/uL — ABNORMAL LOW (ref 3.87–5.11)
WBC: 7.9 10*3/uL (ref 4.0–10.5)

## 2012-07-03 LAB — PROTIME-INR
INR: 1.61 — ABNORMAL HIGH (ref 0.00–1.49)
Prothrombin Time: 19.4 seconds — ABNORMAL HIGH (ref 11.6–15.2)

## 2012-07-03 MED ORDER — BISACODYL 10 MG RE SUPP
10.0000 mg | Freq: Every day | RECTAL | Status: DC | PRN
  Administered 2012-07-03: 10 mg via RECTAL
  Filled 2012-07-03 (×2): qty 1

## 2012-07-03 MED ORDER — ASPIRIN 81 MG PO CHEW
324.0000 mg | CHEWABLE_TABLET | ORAL | Status: AC
  Administered 2012-07-04: 324 mg via ORAL
  Filled 2012-07-03: qty 4

## 2012-07-03 MED ORDER — SODIUM CHLORIDE 0.9 % IJ SOLN: 3.0000 mL | Freq: Two times a day (BID) | INTRAMUSCULAR | Status: DC

## 2012-07-03 MED ORDER — SODIUM CHLORIDE 0.9 % IV SOLN: 250.0000 mL | INTRAVENOUS | Status: DC | PRN

## 2012-07-03 MED ORDER — SODIUM CHLORIDE 0.9 % IV SOLN
INTRAVENOUS | Status: DC
  Administered 2012-07-04: 10 mL/h via INTRAVENOUS

## 2012-07-03 MED ORDER — SODIUM CHLORIDE 0.9 % IJ SOLN: 3.0000 mL | INTRAMUSCULAR | Status: DC | PRN

## 2012-07-03 NOTE — Progress Notes (Signed)
Pt stated she is feeling better from pain was 7/10 now 3/10.  Will cont. To monitor.  Amanda Pea, Charity fundraiser.

## 2012-07-03 NOTE — Progress Notes (Signed)
Pt remain stable after co CP,  Nausea and sob.  Dr. Wandra Mannan informed of meds  Given and asked if needed EKG instructed non needed at this time to get one if pt. Have another episode.  Amanda Pea, Charity fundraiser.

## 2012-07-03 NOTE — Progress Notes (Signed)
ANTICOAGULATION CONSULT NOTE - Follow Up Consult  Pharmacy Consult for heparin Indication: atrial fibrillation  Allergies  Allergen Reactions  . Penicillins Hives    Patient Measurements: Height: 5' (152.4 cm) Weight: 126 lb 8 oz (57.38 kg) (scale B) IBW/kg (Calculated) : 45.5    Vital Signs: Temp: 97.1 F (36.2 C) (07/31 0600) Temp src: Oral (07/31 0600) BP: 115/68 mmHg (07/31 0600) Pulse Rate: 62  (07/31 0600)  Labs:  Alvira Philips 07/03/12 0640 07/02/12 1802 07/02/12 0622 07/01/12 0641 07/01/12 0640  HGB 10.5* -- -- -- --  HCT 29.7* -- -- -- --  PLT 167 -- -- -- --  APTT -- -- -- -- --  LABPROT 19.4* -- 23.0* 37.0* --  INR 1.61* -- 2.00* 3.67* --  HEPARINUNFRC 0.76* 0.17* -- -- --  CREATININE -- -- 1.39* 1.35* --  CKTOTAL -- -- -- -- --  CKMB -- -- -- -- --  TROPONINI -- -- -- -- <0.30    Estimated Creatinine Clearance: 25.6 ml/min (by C-G formula based on Cr of 1.39).  Medications:  Prescriptions prior to admission  Medication Sig Dispense Refill  . amLODipine (NORVASC) 5 MG tablet Take 5 mg by mouth daily.      . calcium carbonate (OS-CAL) 600 MG TABS Take 600 mg by mouth 2 (two) times daily with a meal.      . carbamazepine (CARBATROL) 100 MG 12 hr capsule Take 100 mg by mouth 2 (two) times daily.      . carvedilol (COREG) 6.25 MG tablet Take 9.37 mg by mouth 2 (two) times daily with a meal.       . fexofenadine (ALLEGRA) 180 MG tablet Take 180 mg by mouth daily.      [name] levalbuterol (XOPENEX HFA) 45 MCG/ACT inhaler Inhale 1-2 puffs into the lungs every 4 (four) hours as needed for wheezing.  1 Inhaler  12  . losartan (COZAAR) 100 MG tablet Take 100 mg by mouth daily.      . mupirocin cream (BACTROBAN) 2 % Apply topically 3 (three) times daily.  15 g  0  . rosuvastatin (CRESTOR) 10 MG tablet Take 10 mg by mouth daily.      [name] sulfamethoxazole-trimethoprim (BACTRIM DS,SEPTRA DS) 800-160 MG per tablet Take 1 tablet by mouth 2 (two) times daily. For 10 days Started  7/26      . tiotropium (SPIRIVA) 18 MCG inhalation capsule Place 18 mcg into inhaler and inhale daily.      . traZODone (DESYREL) 50 MG tablet Take 100 mg by mouth at bedtime.       [name] warfarin (COUMADIN) 5 MG tablet Take 5-7.5 mg by mouth daily. 1 tablet on Tuesday and Friday 1.5 tablet all other days       Scheduled:     . amLODipine  5 mg Oral Daily  . aspirin EC  81 mg Oral Daily  . atorvastatin  20 mg Oral q1800  . carbamazepine  100 mg Oral BID  . carvedilol  3.125 mg Oral BID WC  . demeclocycline  300 mg Oral Q12H  . docusate sodium  100 mg Oral BID  . furosemide  40 mg Oral Daily  . loratadine  10 mg Oral Daily  . losartan  50 mg Oral Daily  . mupirocin cream   Topical TID  . polyethylene glycol  17 g Oral Daily  . potassium chloride  20 mEq Oral BID  . tiotropium  18 mcg Inhalation Daily    Assessment: Patient  is a 78 y.o female with afib who has been receiving Septra at home for an infection that developed after skin CA excision.  Patient presented with supratherapeutic INR of 5.26 likely d/t  interaction between septra and coumadin.Septra now discontinued.  Coumadin on hold, heparin continues.  Heparin level today is 0.76, slightly above desired goal range.  No bleeding complications noted.  Her CBC is stable - at baseline.    Goal of Therapy:  Heparin level 0.3-0.7 units/ml Monitor platelets by anticoagulation protocol: Yes   Plan:  1. Will decrease rate just slightly to avoid ongoing accumulation.  New rate will be 800 units/hr. 2. Check heparin level daily while on heparin. 3. Monitor for s/sx of bleeding 4.  Check PTLC daily.  Nadara Mustard, PharmD., MS Clinical Pharmacist Pager:  [phone]  Thank you for allowing pharmacy to be part of this patients care team.

## 2012-07-03 NOTE — Progress Notes (Signed)
[facility] PROGRESS NOTE  Angela Franco ZOX:[id number] DOB: January 21, 1929 DOA: 06/29/2012 PCP: No primary provider on file. None in [place]. Former PCP [name], MD [place], [place]  Assessment/Plan: 1. Hyponatremia: Per conversation with nephrology started demeclocycline on 7/30.  Asymptomatic. Most likely secondary to Tegretol, possibly complicated by recent addition of Trazodone. Trazodone discontinued on 7/30. Continue Fluid restriction. Patient has severe pain when taken off Tegretol and therefore refuses discontinued.  2. Acute systolic heart failure: Continues to improve. Complicated by severe pulmonary hypertension. Mildly symptomatic. Continue Lasix. 3. Severe pulmonary hypertension: Etiology unclear. No history to suggest VTE (INR >5 on admission).  4. Elevated troponin/chest discomfort: Appreciate cardiology evaluation and recommendations. Continue ASA, Coreg.  Cath in AM. 5. Normocytic anemia: Stable. 6. Atrial fibrillation: Continue carvedilol, losartan. Warfarin on hold for Cath.  Patient on heparin. 7. Warfarin-induced coagulopathy: INR high on admission, without bleeding. Probably high secondary to recent start of Bactrim as an outpatient.  8. History of cellulitis: Post procedure infection of the nose.  Appears resolved. Discontinue antibiotics on 7/30. 9. Trigeminal neuralgia: Continue carbamazepine--patient refuses to discontinue and other agents have been ineffective-- resulting in excruciating pain.  Code Status: DNR Family Communication: [name] (Daughter) [phone] Disposition Plan: Home when improved    Brief narrative: 78 year old woman with history of hyponatremia (on Carbamazepine) last hospitalized January of this year for the same (responded to fluid restriction) presented today with 1-2 week history of gradual decline, lethargy, some confusion and increasing lower extremity edema. She reports she is chronic  hyponatremia but cannot recall her baseline. She refuses to discontinue carbamazepine because of intractable pain without it. She has been told that she is "right heart failure" was not been on diuretics because of her history of hyponatremia. She denies any previous cardiac history other than atrial fibrillation.   Record Review (obtained by fax from PCP's office):  05/15/12 Hemoglobin 10.9, sodium 126, chloride 88  05/15/12 2-d echocardiogram: LVEF 40-45%, RV pressure 73, severe pulmonary hypertension, moderate tricuspid regurgitation, right atrium moderate-severe dilatation, moderate anterior/anteroseptal hypokinesis.  EKG (undated): SR, LBBB, anterior infarct, old. Overall appearance similar to today's EKG.  Consultants:  Cardiology  Physical therapy--no follow-up  Procedures:  R/L heart cath 8/1  HPI/Subjective: Afebrile, VSS. Feels somewhat better.  Worried about cath.  Objective: Filed Vitals:   07/03/12 0600 07/03/12 0921 07/03/12 0950 07/03/12 1334  BP: 115/68  129/72 131/82  Pulse: 62   109  Temp: 97.1 F (36.2 C)   99.3 F (37.4 C)  TempSrc: Oral   Oral  Resp: 18   19  Height:      Weight:      SpO2: 100% 96%  97%    Intake/Output Summary (Last 24 hours) at 07/03/12 1355 Last data filed at 07/03/12 1300  Gross per 24 hour  Intake  497.5 ml  Output   2275 ml  Net -1777.5 ml   Wt Readings from Last 3 Encounters:  07/03/12 57.38 kg (126 lb 8 oz)  06/28/12 60.51 kg (133 lb 6.4 oz)    Exam:   General:  Appears calm and comfortable  Cardiovascular: irreg, no m/r/g. LE edema (1+).  Respiratory: Anterior, rales normal respiratory effort, no w/r/r.  Data Reviewed: Basic Metabolic Panel:  Lab 07/02/12 6295 07/01/12 0641 06/30/12 0131 06/29/12 1205  NA 121* 119* 121* 120*  K 3.7 3.7 3.5 3.8  CL 84* 83* 83* 82*  CO2 27 25 25 25   GLUCOSE 91 83 110* 97  BUN 26* 20 15  16  CREATININE 1.39* 1.35* 1.14* 1.02  CALCIUM 8.7 8.6 8.8 9.3  MG -- -- -- --  PHOS --  -- -- --   CBC:  Lab 07/03/12 0640 06/29/12 1205  WBC 7.9 7.2  NEUTROABS -- 5.8  HGB 10.5* 10.7*  HCT 29.7* 30.6*  MCV 85.6 85.0  PLT 167 141*   Cardiac Enzymes:  Lab 07/01/12 0640 06/30/12 0900 06/30/12 0132 06/29/12 1749 06/29/12 1423  CKTOTAL -- 22 20 21  --  CKMB -- 1.3 1.2 1.3 --  CKMBINDEX -- -- -- -- --  TROPONINI <0.30 0.32* <0.30 0.63* 0.59*   BNP (last 3 results)  Basename 06/29/12 1423  PROBNP 3820.0*   Studies:  Scheduled Meds:    . amLODipine  5 mg Oral Daily  . aspirin EC  81 mg Oral Daily  . atorvastatin  20 mg Oral q1800  . carbamazepine  100 mg Oral BID  . carvedilol  3.125 mg Oral BID WC  . demeclocycline  300 mg Oral Q12H  . docusate sodium  100 mg Oral BID  . furosemide  40 mg Oral Daily  . loratadine  10 mg Oral Daily  . losartan  50 mg Oral Daily  . mupirocin cream   Topical TID  . polyethylene glycol  17 g Oral Daily  . potassium chloride  20 mEq Oral BID  . tiotropium  18 mcg Inhalation Daily   Continuous Infusions:    . heparin 800 Units/hr (07/03/12 1131)  . DISCONTD: heparin 800 Units/hr (07/02/12 1053)    Principal Problem:  *Systolic and diastolic CHF, acute on chronic, (BNP 3800) Active Problems:  Hyponatremia, history of chronic hyponatremia  Elevated troponin, NSTEMI vs secondary elevation from CHF  Normocytic anemia  Atrial fibrillation, chronic  Trigeminal neuralgia  Moderate to severe pulmonary hypertension, PA pressure in 70s June 2013  Elevated INR  Cardiomyopathy, EF 40-45% 2D June 2013, possibly ischemic with AS WMA  LBBB (left bundle branch block)  Chronic anticoagulation  HTN (hypertension)     Molli Posey, MD  [facility] Pager [phone]. If 7PM-7AM, please contact night-coverage at [email or web address], password [facility] 07/03/2012, 1:55 PM  LOS: 4 days   Time spent: 25 minutes

## 2012-07-03 NOTE — Clinical Documentation Improvement (Signed)
RENAL FAILURE DOCUMENTATION CLARIFICATION QUERY  THIS DOCUMENT IS NOT A PERMANENT PART OF THE MEDICAL RECORD   Please update your documentation within the medical record to reflect your response to this query.                                                                                     07/03/12  Dr. Earlene Plater and/or Associates,  In a better effort to capture your patient's severity of illness, reflect appropriate length of stay and utilization of resources, a review of the patient medical record has revealed the following indicators:  BUN/Cr/GFR   (wf)     This admission 16/1.02/51     (06/29/12) 15/1.14/44     (06/30/12) 20/1.35/36     (07/01/12) 26/1.39/35     (07/02/12)  Creat up 0.37 from admission GFR has decreased > 20% from admission  Admitted for Acute Systolic and Diastolic Heart Failure Treated with IV Lasix this admission Lasix changed to po on 07/01/12  Serial Chemistries 06/29/12 through 07/02/12   Based on your clinical judgment, please document in the progress notes and discharge summary if a condition below provides greater specificity regarding the patient's renal status during this admission:   - Acute Kidney Injury   - Acute Renal Failure   - Other Condition   - Unable to Clinically Determine   In responding to this query please exercise your independent judgment.    The fact that a query is asked, does not imply that any particular answer is desired or expected.   Reviewed:  no additional documentation provided   Mild bump in creatinine secondary to diuresis  Thank You,  Jerral Ralph RN BSN CCDS Certified Clinical Documentation Specialist: Cell   [phone]  Health Information Management [place]  TO RESPOND TO THE THIS QUERY, FOLLOW THE INSTRUCTIONS BELOW:  1. If needed, update documentation for the patient's encounter via the notes activity.  2. Access this query again and click edit on the In [employer].  3. After  updating, or not, click F2 to complete all highlighted (required) fields concerning your review. Select "additional documentation in the medical record" OR "no additional documentation provided".  4. Click Sign note button.  5. The deficiency will fall out of your In Basket *Please let us know if you are not able to complete this workflow by phone or e-mail (listed below).

## 2012-07-03 NOTE — Progress Notes (Signed)
Pt c/o CP to med chest & to Lt side of back and nausea.  BP 132/59, P101.  Also SOB 02 sats 95% on RA place on 2L o2 for comfort and also B. Hager , PA made aware and instructed to give her mso4 and zofran as ordered.  Will  Cont. To monitor.

## 2012-07-03 NOTE — Progress Notes (Signed)
Consent obtained from pt for Rt. & Lt. Heart cath for tomorrow by Dr. Alona Bene.  Amanda Pea, RN

## 2012-07-03 NOTE — Progress Notes (Signed)
[facility]  Subjective:   Objective: Vital signs in last 24 hours: Temp:  [97.1 F (36.2 C)-98.5 F (36.9 C)] 97.1 F (36.2 C) (07/31 0600) Pulse Rate:  [62-94] 62  (07/31 0600) Resp:  [18-19] 18  (07/31 0600) BP: (100-129)/(60-77) 129/72 mmHg (07/31 0950) SpO2:  [89 %-100 %] 96 % (07/31 0921) Weight:  [57.38 kg (126 lb 8 oz)] 57.38 kg (126 lb 8 oz) (07/31 0423) Last BM Date: 06/29/12  Intake/Output from previous day: 07/30 0701 - 07/31 0700 In: 727.5 [P.O.:620; I.V.:105.5; IV Piggyback:2] Out: 2000 [Urine:2000] Intake/Output this shift:    Medications Current Facility-Administered Medications  Medication Dose Route Frequency Provider Last Rate Last Dose  . acetaminophen (TYLENOL) tablet 650 mg  650 mg Oral Q4H PRN Standley Brooking, MD   650 mg at 07/02/12 2109  . amLODipine (NORVASC) tablet 5 mg  5 mg Oral Daily Nada Boozer, NP   5 mg at 07/03/12 0950  . aspirin EC tablet 81 mg  81 mg Oral Daily Standley Brooking, MD   81 mg at 07/03/12 0949  . atorvastatin (LIPITOR) tablet 20 mg  20 mg Oral q1800 Standley Brooking, MD   20 mg at 07/02/12 1807  . carbamazepine (TEGRETOL XR) 12 hr tablet 100 mg  100 mg Oral BID Standley Brooking, MD   100 mg at 07/03/12 0950  . carvedilol (COREG) tablet 3.125 mg  3.125 mg Oral BID WC Nada Boozer, NP   3.125 mg at 07/03/12 0644  . demeclocycline (DECLOMYCIN) tablet 300 mg  300 mg Oral Q12H Standley Brooking, MD   300 mg at 07/03/12 0951  . docusate sodium (COLACE) capsule 100 mg  100 mg Oral BID Standley Brooking, MD   100 mg at 07/03/12 0951  . furosemide (LASIX) tablet 40 mg  40 mg Oral Daily Lennette Bihari, MD   40 mg at 07/03/12 0951  . guaiFENesin-dextromethorphan (ROBITUSSIN DM) 100-10 MG/5ML syrup 5 mL  5 mL Oral Q4H PRN Rolan Lipa, NP   5 mL at 07/02/12 2111  . heparin ADULT infusion 100 units/mL (25000 units/250 mL)  800 Units/hr Intravenous Continuous Chinita Greenland, PHARMD 8 mL/hr at  07/03/12 0959 800 Units/hr at 07/03/12 0959  . loratadine (CLARITIN) tablet 10 mg  10 mg Oral Daily Standley Brooking, MD   10 mg at 07/03/12 0950  . losartan (COZAAR) tablet 50 mg  50 mg Oral Daily Nada Boozer, NP   50 mg at 07/03/12 0949  . morphine 2 MG/ML injection 2 mg  2 mg Intravenous Q3H PRN Abelino Derrick, PA      . mupirocin cream (BACTROBAN) 2 %   Topical TID Standley Brooking, MD      . ondansetron [facility]) injection 4 mg  4 mg Intravenous Q6H PRN Standley Brooking, MD   4 mg at 07/02/12 2123  . polyethylene glycol (MIRALAX / GLYCOLAX) packet 17 g  17 g Oral Daily Standley Brooking, MD   17 g at 07/03/12 [phone]  . potassium chloride SA (K-DUR,KLOR-CON) CR tablet 20 mEq  20 mEq Oral BID Standley Brooking, MD   20 mEq at 07/03/12 0950  . senna (SENOKOT) tablet 8.6 mg  1 tablet Oral Daily PRN Standley Brooking, MD   8.6 mg at 07/03/12 0644  . tiotropium (SPIRIVA) inhalation capsule 18 mcg  18 mcg Inhalation Daily Standley Brooking, MD   18 mcg at 07/03/12 0915  .  traMADol (ULTRAM) tablet 50 mg  50 mg Oral Q6H PRN Abelino Derrick, PA   50 mg at 07/03/12 0052  . zolpidem (AMBIEN) tablet 5 mg  5 mg Oral QHS PRN Standley Brooking, MD   5 mg at 06/29/12 2208  . DISCONTD: heparin ADULT infusion 100 units/mL (25000 units/250 mL)  800 Units/hr Intravenous Continuous Lora Poteet Seay, PHARMD 8 mL/hr at 07/02/12 1053 800 Units/hr at 07/02/12 1053    PE: Gen Cards Lungs Ext Neuro   Lab Results:   [facility] 07/03/12 0640  WBC 7.9  HGB 10.5*  HCT 29.7*  PLT 167   BMET  Basename 07/02/12 0622 07/01/12 0641  NA 121* 119*  K 3.7 3.7  CL 84* 83*  CO2 27 25  GLUCOSE 91 83  BUN 26* 20  CREATININE 1.39* 1.35*  CALCIUM 8.7 8.6   PT/INR  Basename 07/03/12 0640 07/02/12 0622 07/01/12 0641  LABPROT 19.4* 23.0* 37.0*  INR 1.61* 2.00* 3.67*   Assessment/Plan  Principal Problem:  *Systolic and diastolic CHF, acute on chronic, (BNP 3800) Active Problems:  Hyponatremia, history of  chronic hyponatremia  Elevated troponin, NSTEMI vs secondary elevation from CHF  Normocytic anemia  Atrial fibrillation, chronic  Trigeminal neuralgia  Moderate to severe pulmonary hypertension, PA pressure in 70s June 2013  Elevated INR  Cardiomyopathy, EF 40-45% 2D June 2013, possibly ischemic with AS WMA  LBBB (left bundle branch block)  Chronic anticoagulation  HTN (hypertension)  Plan:  Appears euvolemic. Right and left heart cath Thursday.. Net fluid negative 1.7L in the last two days. Wt 60.51 KG >>>57.9Kg. BP improved this morning.  Lasix changed to PO yesterday. Will hold Lasix tomorrow.  Ambulating in the hall with PT without difficulty.  BP stable.  HR is increasing to the 120's with activity but settles to the 60-70's.   INR now 1.61 and she is getting IV heparin.        LOS: 4 days    HAGER, BRYAN 07/03/2012 11:11 AM I have seen and examined the patient along with Wilburt Finlay, PA.  I have reviewed the chart, notes and new data.  I agree with PA's note.  Key new complaints: Improved heart failure symptoms, but she still complains of orthopnea. Key examination changes: no edema and no JVD, question S3 Key new findings / data: persistent marked hyponatremia; INR 1.6  PLAN: Right and left heart cath planned for tomorrow. Possible restrictive cardiomyopathy. This procedure has been fully reviewed with the patient and written informed consent has been obtained.   Thurmon Fair, MD, [facility] [phone] 07/03/2012, 2:42 PM

## 2012-07-04 ENCOUNTER — Encounter (HOSPITAL_COMMUNITY)
Admission: EM | Disposition: A | Discharge status: Nursing Home (Includes ICF) | Payer: Self-pay | Source: Home / Self Care | Attending: Internal Medicine

## 2012-07-04 DIAGNOSIS — R5381 Other malaise: Secondary | ICD-10-CM

## 2012-07-04 HISTORY — PX: CARDIAC CATHETERIZATION: SHX172

## 2012-07-04 HISTORY — PX: LEFT AND RIGHT HEART CATHETERIZATION WITH CORONARY ANGIOGRAM: SHX5449

## 2012-07-04 LAB — POCT I-STAT 3, VENOUS BLOOD GAS (G3P V)
Bicarbonate: 23.8 mEq/L (ref 20.0–24.0)
O2 Saturation: 52 %
TCO2: 25 mmol/L (ref 0–100)
pCO2, Ven: 34.7 mmHg — ABNORMAL LOW (ref 45.0–50.0)
pO2, Ven: 26 mmHg — CL (ref 30.0–45.0)

## 2012-07-04 LAB — BASIC METABOLIC PANEL
Chloride: 85 mEq/L — ABNORMAL LOW (ref 96–112)
Creatinine, Ser: 1.04 mg/dL (ref 0.50–1.10)
GFR calc Af Amer: 57 mL/min — ABNORMAL LOW (ref >90)

## 2012-07-04 LAB — POCT I-STAT 3, ART BLOOD GAS (G3+)
Acid-Base Excess: 3 mmol/L — ABNORMAL HIGH (ref 0.0–2.0)
pCO2 arterial: 30 mmHg — ABNORMAL LOW (ref 35.0–45.0)
pH, Arterial: 7.533 — ABNORMAL HIGH (ref 7.350–7.450)
pO2, Arterial: 66 mmHg — ABNORMAL LOW (ref 80.0–100.0)

## 2012-07-04 LAB — CBC
MCV: 85.5 fL (ref 78.0–100.0)
Platelets: 168 10*3/uL (ref 150–400)
RDW: 13.9 % (ref 11.5–15.5)
WBC: 7.3 10*3/uL (ref 4.0–10.5)

## 2012-07-04 LAB — PROTIME-INR: Prothrombin Time: 18.5 seconds — ABNORMAL HIGH (ref 11.6–15.2)

## 2012-07-04 LAB — HEPARIN LEVEL (UNFRACTIONATED): Heparin Unfractionated: 0.92 IU/mL — ABNORMAL HIGH (ref 0.30–0.70)

## 2012-07-04 SURGERY — LEFT AND RIGHT HEART CATHETERIZATION WITH CORONARY ANGIOGRAM
Anesthesia: LOCAL

## 2012-07-04 MED ORDER — HEPARIN (PORCINE) IN NACL 2-0.9 UNIT/ML-% IJ SOLN
INTRAMUSCULAR | Status: AC
  Filled 2012-07-04: qty 2000

## 2012-07-04 MED ORDER — ONDANSETRON HCL 4 MG/2ML IJ SOLN
4.0000 mg | Freq: Four times a day (QID) | INTRAMUSCULAR | Status: DC | PRN
  Administered 2012-07-06: 4 mg via INTRAVENOUS
  Filled 2012-07-04: qty 2

## 2012-07-04 MED ORDER — NITROGLYCERIN 0.2 MG/ML ON CALL CATH LAB
INTRAVENOUS | Status: AC
  Filled 2012-07-04: qty 1

## 2012-07-04 MED ORDER — HEPARIN (PORCINE) IN NACL 100-0.45 UNIT/ML-% IJ SOLN
750.0000 [IU]/h | INTRAMUSCULAR | Status: DC
  Administered 2012-07-04: 700 [IU]/h via INTRAVENOUS
  Filled 2012-07-04 (×2): qty 250

## 2012-07-04 MED ORDER — LIDOCAINE HCL (PF) 1 % IJ SOLN
INTRAMUSCULAR | Status: AC
  Filled 2012-07-04: qty 30

## 2012-07-04 MED ORDER — WARFARIN SODIUM 5 MG PO TABS
5.0000 mg | ORAL_TABLET | Freq: Once | ORAL | Status: AC
  Administered 2012-07-04: 5 mg via ORAL
  Filled 2012-07-04 (×2): qty 1

## 2012-07-04 MED ORDER — TOLVAPTAN 15 MG PO TABS
15.0000 mg | ORAL_TABLET | ORAL | Status: DC
  Administered 2012-07-04 – 2012-07-07 (×4): 15 mg via ORAL
  Filled 2012-07-04 (×8): qty 1

## 2012-07-04 MED ORDER — SODIUM CHLORIDE 0.9 % IV SOLN
1.0000 mL/kg/h | INTRAVENOUS | Status: AC
  Administered 2012-07-04: 1 mL/kg/h via INTRAVENOUS

## 2012-07-04 MED ORDER — ACETAMINOPHEN 325 MG PO TABS
650.0000 mg | ORAL_TABLET | ORAL | Status: DC | PRN
  Filled 2012-07-04: qty 2

## 2012-07-04 MED ORDER — WARFARIN - PHARMACIST DOSING INPATIENT
Freq: Every day | Status: DC
  Administered 2012-07-06: 18:00:00

## 2012-07-04 MED ORDER — CARVEDILOL 6.25 MG PO TABS
6.2500 mg | ORAL_TABLET | Freq: Two times a day (BID) | ORAL | Status: DC
  Administered 2012-07-04 – 2012-07-08 (×8): 6.25 mg via ORAL
  Filled 2012-07-04 (×10): qty 1

## 2012-07-04 MED ORDER — METOPROLOL TARTRATE 1 MG/ML IV SOLN
INTRAVENOUS | Status: AC
  Filled 2012-07-04: qty 5

## 2012-07-04 NOTE — Progress Notes (Signed)
Pt. Seen and examined. Agree with the NP/PA-C note as written.  Plan LHC/RHC and coronaries today. May do a nitrate challenge for pulmonary hypertension. Plan groin approach. Will assess for possible restriction.  Chrystie Nose, MD, [facility] Attending Cardiologist [facility]

## 2012-07-04 NOTE — CV Procedure (Addendum)
[facility]     CARDIAC CATHETERIZATION REPORT  Angela Franco   [id number] 07/03/1929  Performing Cardiologist: Chrystie Nose Primary Physician: No primary provider on file. Primary Cardiologist:  None  Procedures Performed:  Left Heart Catheterization via 5 Fr right femoral artery access  Right Heart Catherization via 6 Fr right femoral vein access  Left Ventriculography, (RAO/LAO) 15 ml/sec for 30 ml total contrast  Native Coronary Angiography  Simultaneous right and left heart pressures  Indication(s): NSTEMI, pulmonary hypertension, ?restrictive physiology  History: 78 y.o. female who recently moved to the area with a past medical history significant for recent diagnosis of Rt heart failure by echo June 2013 and hyponatremia. She has no history of MI or CAD and has never had a cath. She recently moved to the area after her husband of 61 yrs died a couple of weeks ago, (he was an Pensions consultant in [place] for 85yrs). She has CAF and is on Coumadin. Echo in June 2013 showed an EF of 40-45% with severe pulm HTN, grade 3 diastolic dysfunction. There is an AS WMA suggestive of CAD. She is admitted no with increasing SOB, lower extremity edema, and pain with inspiration. Her Troponin is positive at 0.63.  She is referred for RHC/LHC and coronary angiogram as well as physiology measurements to assess for restrictive physiology.   Consent: The procedure with Risks/Benefits/Alternatives and Indications was reviewed with the patient (and family).  All questions were answered.    Risks / Complications include, but not limited to: Death, MI, CVA/TIA, VF/VT (with defibrillation), Bradycardia (need for temporary pacer placement), contrast induced nephropathy, bleeding / bruising / hematoma / pseudoaneurysm, vascular or coronary injury (with possible emergent CT or Vascular Surgery), adverse medication reactions, infection.    The patient (and family) voice  understanding and agree to proceed.    Risks of procedure as well as the alternatives and risks of each were explained to the (patient/caregiver).  Consent for procedure obtained. Consent for signed by MD and patient with RN witness -- placed on chart.  Procedure: The patient was brought to the 2nd Floor [place] [facility] in the fasting state and prepped and draped in the usual sterile fashion for (Right groin) access.   Sterile technique was used including antiseptics, cap, gloves, gown, hand hygiene, mask and sheet.  Skin prep: Chlorhexidine;  Time Out: Verified patient identification, verified procedure, site/side was marked, verified correct patient position, special equipment/implants available, medications/allergies/relevent history reviewed, required imaging and test results available.  Performed  The right femoral head was identified using tactile and fluoroscopic technique.  The right groin was anesthetized with 1% subcutaneous Lidocaine.  The right Common Femoral Artery and right Common Femoral veins were accessed using the Modified Seldinger Technique with placement of a antimicrobial bonded/coated single lumen (5 Fr) sheath in the artery and 6 Fr sheath in the vein using the Seldinger technique.  The sheaths were aspirated and flushed.  A Swan-Ganz catheter was then advanced, at times with help over a Swan wire, to the right heart.  Pressure measurements of the atrium, right ventricle, pulmonary artery, and pulmonary capillary ridge pressure positions were obtained.  Oxygen saturations and cardiac output were measured. The Swan-Ganz catheter was left in the right ventricle and subsequently a pigtail catheter was inserted through the femoral artery into the left ventricle for simultaneous pressure measurements.  Once measurements were satisfactorily obtained the Swan-Ganz catheter was removed and left ventriculography was performed.  The catheter was then  removed back  across the aortic valve to measure a pullback gradient.  A 5 Fr JL4 Catheter was advanced of over a standard J wire into the ascending Aorta.  The catheter was used to engage the left and coronary artery.  Multiple cineangiographic views of the left coronary artery system(s) were performed.  A 5 Fr JR 4 Catheter was advanced of over a standard J wire into the ascending Aorta.  The catheter was used to engage the right coronary artery.  Multiple cineangiographic views of the right coronary artery system(s) were performed.  The catheter and the wire was removed completely out of the body.  The patient was transferred to the holding area where the sheaths were removed with manual pressure held for hemostasis.    The patient was transported to the cath lab holding area in stable condition.   The patient  was stable before, during and following the procedure.   Patient did tolerate procedure well. There were not complications.  EBL: <10 cc  Medications:  Premedication: none  Sedation:  none  Contrast:  70 cc Omnipaque  5 cc 1% lidocaine  5 MG iv LOPRESSOR  Hemodynamics:  Central Aortic Pressure / Mean Aortic Pressure: 135/82  LV Pressure / LV End diastolic Pressure:  21  Left Ventriculography:  EF:  30-35%  Wall Motion: Global hypokinesis, 1+ MR  Coronary Angiographic Data:  1. Angiographically normal coronary arteries 2. Right dominant circulation 3. LVEDP = 21 mmHg  Right Heart Catheterization Data:  RA - 17 RV - 49/17 PA - 48/30 (38) PCWP - 23 FCO/FCI - 3.55/2.32 TDCO/TDCI - 1.75/1.14 PA Sat - 56% AO Sat - 95% (room air) TPG - 15 PVR - 8 WU's  Pressure tracings indicated mild "dip and plateau" with concordant changes in pressures with respiration (IV lopressor was given to try to prolong diastolic filling time). There appears to be near diastolic equalization of filling pressures.  Impression:  1.  Angiographically normal coronary arteries. 2.  LVEF of 30-35% with  global hypokinesis 3.  Elevated right and left heart pressures with some data to support restrictive physiology. 4.  Reduced cardiac output. 5.  Mild pulmonary hypertension (mixed pulmonary venous and pulmonary arterial hypertension by TPG) - elevated PVR. 6.  Hyponatremia may be secondary to heart failure. 7.  Persistent a-fib with RVR.  Plan: 1.  Would benefit from additional diuresis - I would consider adding Tolvaptan given her low sodium and hyperosmolar state (not improved by demeclocycline), which may promote greater aquaresis. 2.  Better HR control will help diastolic filling time.  I would consider anti-arrythmic therapy for the a-fib and a possible TEE/Cardioversion attempt as she will likely feel better in sinus. 3.  If HR can be better controlled, would consider cardiac MRI to look for possible infiltrative disease to explain her restrictive cardiomyopathy. 4.  D/C norvasc.  Increase carvedilol to 6.25 mg BID, starting tonight.  The case and results was discussed with the patient (and family). The case and results was not discussed with the patient's PCP. The case and results was discussed with the patient's Cardiologist.  Time Spend Directly with Patient:  60 minutes  Chrystie Nose, MD, [facility] Attending Cardiologist [facility]  HILTY,Kenneth C 07/04/2012, 2:50 PM

## 2012-07-04 NOTE — Progress Notes (Signed)
ANTICOAGULATION CONSULT NOTE - Follow Up Consult  Pharmacy Consult for heparin Indication: atrial fibrillation  Allergies  Allergen Reactions  . Penicillins Hives    Patient Measurements: Height: 5' (152.4 cm) Weight: 126 lb 8 oz (57.38 kg) (scale b) IBW/kg (Calculated) : 45.5    Vital Signs: Temp: 98.9 F (37.2 C) (08/01 0505) Temp src: Oral (08/01 0505) BP: 139/99 mmHg (08/01 0505) Pulse Rate: 110  (08/01 0505)  Labs:  Basename 07/04/12 0527 07/03/12 0640 07/02/12 1802 07/02/12 0622  HGB 9.8* 10.5* -- --  HCT 27.7* 29.7* -- --  PLT 168 167 -- --  APTT -- -- -- --  LABPROT 18.5* 19.4* -- 23.0*  INR 1.51* 1.61* -- 2.00*  HEPARINUNFRC 0.92* 0.76* 0.17* --  CREATININE 1.04 -- -- 1.39*  CKTOTAL -- -- -- --  CKMB -- -- -- --  TROPONINI -- -- -- --    Estimated Creatinine Clearance: 34.3 ml/min (by C-G formula based on Cr of 1.04).  Medications:  Prescriptions prior to admission  Medication Sig Dispense Refill  . amLODipine (NORVASC) 5 MG tablet Take 5 mg by mouth daily.      . calcium carbonate (OS-CAL) 600 MG TABS Take 600 mg by mouth 2 (two) times daily with a meal.      . carbamazepine (CARBATROL) 100 MG 12 hr capsule Take 100 mg by mouth 2 (two) times daily.      . carvedilol (COREG) 6.25 MG tablet Take 9.37 mg by mouth 2 (two) times daily with a meal.       . fexofenadine (ALLEGRA) 180 MG tablet Take 180 mg by mouth daily.      [name] levalbuterol (XOPENEX HFA) 45 MCG/ACT inhaler Inhale 1-2 puffs into the lungs every 4 (four) hours as needed for wheezing.  1 Inhaler  12  . losartan (COZAAR) 100 MG tablet Take 100 mg by mouth daily.      . mupirocin cream (BACTROBAN) 2 % Apply topically 3 (three) times daily.  15 g  0  . rosuvastatin (CRESTOR) 10 MG tablet Take 10 mg by mouth daily.      [name] sulfamethoxazole-trimethoprim (BACTRIM DS,SEPTRA DS) 800-160 MG per tablet Take 1 tablet by mouth 2 (two) times daily. For 10 days Started 7/26      . tiotropium (SPIRIVA) 18 MCG  inhalation capsule Place 18 mcg into inhaler and inhale daily.      . traZODone (DESYREL) 50 MG tablet Take 100 mg by mouth at bedtime.       [name] warfarin (COUMADIN) 5 MG tablet Take 5-7.5 mg by mouth daily. 1 tablet on Tuesday and Friday 1.5 tablet all other days       Scheduled:     . amLODipine  5 mg Oral Daily  . aspirin EC  81 mg Oral Daily  . atorvastatin  20 mg Oral q1800  . carbamazepine  100 mg Oral BID  . carvedilol  3.125 mg Oral BID WC  . demeclocycline  300 mg Oral Q12H  . docusate sodium  100 mg Oral BID  . furosemide  40 mg Oral Daily  . loratadine  10 mg Oral Daily  . losartan  50 mg Oral Daily  . mupirocin cream   Topical TID  . polyethylene glycol  17 g Oral Daily  . potassium chloride  20 mEq Oral BID  . tiotropium  18 mcg Inhalation Daily    Assessment: Patient is a 78 y.o female with afib who has been receiving Septra at  home for an infection that developed after skin CA excision.  Patient presented with supratherapeutic INR of 5.26 likely d/t  interaction between septra and coumadin.Septra now discontinued.  Coumadin on hold, heparin continues.  Heparin level today is 0.92 above desired goal range.   Her CBC is decreased,no bleeding complications noted.  Plan cath today.    Goal of Therapy:  Heparin level 0.3-0.7 units/ml Monitor platelets by anticoagulation protocol: Yes   Plan:  1. Hold heparin for 1 hour. 2. Restart at 700 units/hr 3. F/u after cath   Talbert Cage, PharmD. Clinical Pharmacist Pager:  [phone]  Thank you for allowing pharmacy to be part of this patients care team.

## 2012-07-04 NOTE — Care Management Note (Unsigned)
    Page 1 of 1   07/04/2012     3:38:19 PM   CARE MANAGEMENT NOTE 07/04/2012  Patient:  Angela Franco, Angela Franco   Account Number:  [id number]  Date Initiated:  07/04/2012  Documentation initiated by:  Tera Mater  Subjective/Objective Assessment:   78yo female admitted with weakness.  Pt. spouse died last week and pt. is now living with daughter here in [place].     Action/Plan:   Discharge planning for [facility] services and DME   Anticipated DC Date:  07/05/2012   Anticipated DC Plan:  HOME W HOME HEALTH SERVICES      DC Planning Services  CM consult      Choice offered to / List presented to:             Status of service:  In process, will continue to follow Medicare Important Message given?   (If response is "NO", the following Medicare IM given date fields will be blank) Date Medicare IM given:   Date Additional Medicare IM given:    Discharge Disposition:    Per UR Regulation:  Reviewed for med. necessity/level of care/duration of stay  If discussed at Long Length of Stay Meetings, dates discussed:    Comments:  07/04/12 1532 Tera Mater, RN, BSN NCM [phone] Admitted with CHF and is currently having a cardiac catheterization.  PT has recommended HH PT.  NCM to follow for discharge needs.

## 2012-07-04 NOTE — Progress Notes (Signed)
[facility] PROGRESS NOTE  Angela Franco JYN:[id number] DOB: [birth year] DOA: 06/29/2012 PCP: No primary provider on file. None in [place]. Former PCP [name], MD [place], [place]  Assessment/Plan: 1. Hyponatremia: Per conversation with nephrology started demeclocycline on 7/30.  Asymptomatic. Most likely secondary to Tegretol, possibly complicated by recent addition of Trazodone. Trazodone discontinued on 7/30. Continue Fluid restriction. Patient has severe pain when taken off Tegretol and therefore refuses discontinued.  2. Acute systolic heart failure: Continues to improve. Complicated by severe pulmonary hypertension. Mildly symptomatic. Continue diuresis as per cardiology  Recommendation. 3. Severe pulmonary hypertension: Etiology unclear. No history to suggest VTE (INR >5 on admission).  4. Elevated troponin/chest discomfort: Appreciate cardiology evaluation and recommendations. Continue ASA, Coreg.  Cath today. 5. Normocytic anemia: Stable. 6. Atrial fibrillation: Continue carvedilol, losartan. Warfarin on hold for Cath.  Patient on heparin. 7. Warfarin-induced coagulopathy: INR high on admission, without bleeding. Probably high secondary to recent start of Bactrim as an outpatient.  8. History of cellulitis: Post procedure infection of the nose.  Appears resolved. Discontinue antibiotics on 7/30. 9. Trigeminal neuralgia: Continue carbamazepine--patient refuses to discontinue and other agents have been ineffective-- resulting in excruciating pain.  Code Status: DNR Family Communication: [name] (Daughter) [phone] Disposition Plan: Home when improved    Brief narrative: 78 year old woman with history of hyponatremia (on Carbamazepine) last hospitalized January of this year for the same (responded to fluid restriction) presented today with 1-2 week history of gradual decline, lethargy, some confusion and increasing lower extremity edema.  She reports she is chronic hyponatremia but cannot recall her baseline. She refuses to discontinue carbamazepine because of intractable pain without it. She has been told that she is "right heart failure" was not been on diuretics because of her history of hyponatremia. She denies any previous cardiac history other than atrial fibrillation.   Record Review (obtained by fax from PCP's office):  05/15/12 Hemoglobin 10.9, sodium 126, chloride 88  05/15/12 2-d echocardiogram: LVEF 40-45%, RV pressure 73, severe pulmonary hypertension, moderate tricuspid regurgitation, right atrium moderate-severe dilatation, moderate anterior/anteroseptal hypokinesis.  EKG (undated): SR, LBBB, anterior infarct, old. Overall appearance similar to today's EKG.  Consultants:  Cardiology  Physical therapy--no follow-up  Procedures:  R/L heart cath 8/1  HPI/Subjective: Afebrile, VSS. Feels somewhat better.  Patient is sitting up in chair by the window daughter is at the bedside.  Objective: Filed Vitals:   07/03/12 0600 07/03/12 0921 07/03/12 0950 07/03/12 1334  BP: 115/68  129/72 131/82  Pulse: 62   109  Temp: 97.1 F (36.2 C)   99.3 F (37.4 C)  TempSrc: Oral   Oral  Resp: 18   19  Height:      Weight:      SpO2: 100% 96%  97%    Intake/Output Summary (Last 24 hours) at 07/03/12 1355 Last data filed at 07/03/12 1300  Gross per 24 hour  Intake  497.5 ml  Output   2275 ml  Net -1777.5 ml   Wt Readings from Last 3 Encounters:  07/03/12 57.38 kg (126 lb 8 oz)  06/28/12 60.51 kg (133 lb 6.4 oz)    Exam:   General:  Appears calm and comfortable  Cardiovascular: irreg, no m/r/g. LE edema (1+).  Respiratory: Anterior, rales normal respiratory effort, no w/r/r.  Data Reviewed: Basic Metabolic Panel:  Lab 07/02/12 7253 07/01/12 0641 06/30/12 0131 06/29/12 1205  NA 121* 119* 121* 120*  K 3.7 3.7 3.5 3.8  CL 84* 83* 83* 82*  CO2  27 25 25 25   GLUCOSE 91 83 110* 97  BUN 26* 20 15 16     CREATININE 1.39* 1.35* 1.14* 1.02  CALCIUM 8.7 8.6 8.8 9.3  MG -- -- -- --  PHOS -- -- -- --   CBC:  Lab 07/03/12 0640 06/29/12 1205  WBC 7.9 7.2  NEUTROABS -- 5.8  HGB 10.5* 10.7*  HCT 29.7* 30.6*  MCV 85.6 85.0  PLT 167 141*   Cardiac Enzymes:  Lab 07/01/12 0640 06/30/12 0900 06/30/12 0132 06/29/12 1749 06/29/12 1423  CKTOTAL -- 22 20 21  --  CKMB -- 1.3 1.2 1.3 --  CKMBINDEX -- -- -- -- --  TROPONINI <0.30 0.32* <0.30 0.63* 0.59*   BNP (last 3 results)  Basename 06/29/12 1423  PROBNP 3820.0*   Studies:  Scheduled Meds:    . amLODipine  5 mg Oral Daily  . aspirin EC  81 mg Oral Daily  . atorvastatin  20 mg Oral q1800  . carbamazepine  100 mg Oral BID  . carvedilol  3.125 mg Oral BID WC  . demeclocycline  300 mg Oral Q12H  . docusate sodium  100 mg Oral BID  . furosemide  40 mg Oral Daily  . loratadine  10 mg Oral Daily  . losartan  50 mg Oral Daily  . mupirocin cream   Topical TID  . polyethylene glycol  17 g Oral Daily  . potassium chloride  20 mEq Oral BID  . tiotropium  18 mcg Inhalation Daily   Continuous Infusions:    . heparin 800 Units/hr (07/03/12 1131)  . DISCONTD: heparin 800 Units/hr (07/02/12 1053)    Principal Problem:  *Systolic and diastolic CHF, acute on chronic, (BNP 3800) Active Problems:  Hyponatremia, history of chronic hyponatremia  Elevated troponin, NSTEMI vs secondary elevation from CHF  Normocytic anemia  Atrial fibrillation, chronic  Trigeminal neuralgia  Moderate to severe pulmonary hypertension, PA pressure in 70s June 2013  Elevated INR  Cardiomyopathy, EF 40-45% 2D June 2013, possibly ischemic with AS WMA  LBBB (left bundle branch block)  Chronic anticoagulation  HTN (hypertension)     Molli Posey, MD  [facility] Pager [phone]. If 7PM-7AM, please contact night-coverage at [email or web address], password [facility] 07/03/2012, 1:55 PM  LOS: 4 days   Time spent: 25 minutes

## 2012-07-04 NOTE — Progress Notes (Signed)
[facility]  Subjective: No Complaints.  Objective: Vital signs in last 24 hours: Temp:  [97.8 F (36.6 C)-99.3 F (37.4 C)] 97.8 F (36.6 C) (08/01 1049) Pulse Rate:  [102-113] 102  (08/01 1049) Resp:  [18-19] 18  (08/01 1049) BP: (113-139)/(63-99) 129/90 mmHg (08/01 1049) SpO2:  [95 %-98 %] 97 % (08/01 1049) Weight:  [57.38 kg (126 lb 8 oz)] 57.38 kg (126 lb 8 oz) (08/01 0505) Last BM Date: 07/03/12  Intake/Output from previous day: 07/31 0701 - 08/01 0700 In: 205.6 [I.V.:203.6; IV Piggyback:2] Out: 775 [Urine:775] Intake/Output this shift: Total I/O In: 0  Out: 250 [Urine:250]  Medications Current Facility-Administered Medications  Medication Dose Route Frequency Provider Last Rate Last Dose  . acetaminophen (TYLENOL) tablet 650 mg  650 mg Oral Q4H PRN Standley Brooking, MD   650 mg at 07/02/12 2109  . amLODipine (NORVASC) tablet 5 mg  5 mg Oral Daily Nada Boozer, NP   5 mg at 07/03/12 0950  . aspirin chewable tablet 324 mg  324 mg Oral Pre-Cath Wilburt Finlay, [place]   324 mg at 07/04/12 4782  . aspirin EC tablet 81 mg  81 mg Oral Daily Wilburt Finlay, PA   81 mg at 07/03/12 0949  . atorvastatin (LIPITOR) tablet 20 mg  20 mg Oral q1800 Standley Brooking, MD   20 mg at 07/03/12 1702  . bisacodyl (DULCOLAX) suppository 10 mg  10 mg Rectal Daily PRN Alinda Money, MD   10 mg at 07/03/12 1657  . carbamazepine (TEGRETOL XR) 12 hr tablet 100 mg  100 mg Oral BID Standley Brooking, MD   100 mg at 07/03/12 2237  . carvedilol (COREG) tablet 3.125 mg  3.125 mg Oral BID WC Nada Boozer, NP   3.125 mg at 07/04/12 9562  . demeclocycline (DECLOMYCIN) tablet 300 mg  300 mg Oral Q12H Standley Brooking, MD   300 mg at 07/03/12 2237  . docusate sodium (COLACE) capsule 100 mg  100 mg Oral BID Standley Brooking, MD   100 mg at 07/03/12 2237  . furosemide (LASIX) tablet 40 mg  40 mg Oral Daily Wilburt Finlay, PA   40 mg at 07/03/12 0951  . guaiFENesin-dextromethorphan  (ROBITUSSIN DM) 100-10 MG/5ML syrup 5 mL  5 mL Oral Q4H PRN Rolan Lipa, NP   5 mL at 07/02/12 2111  . heparin ADULT infusion 100 units/mL (25000 units/250 mL)  700 Units/hr Intravenous Continuous Alinda Money, MD 7 mL/hr at 07/04/12 0908 700 Units/hr at 07/04/12 0908  . loratadine (CLARITIN) tablet 10 mg  10 mg Oral Daily Standley Brooking, MD   10 mg at 07/03/12 0950  . losartan (COZAAR) tablet 50 mg  50 mg Oral Daily Nada Boozer, NP   50 mg at 07/03/12 0949  . morphine 2 MG/ML injection 2 mg  2 mg Intravenous Q3H PRN Abelino Derrick, PA   2 mg at 07/03/12 1310  . mupirocin cream (BACTROBAN) 2 %   Topical TID Standley Brooking, MD      . ondansetron [facility]) injection 4 mg  4 mg Intravenous Q6H PRN Standley Brooking, MD   4 mg at 07/03/12 1307  . polyethylene glycol (MIRALAX / GLYCOLAX) packet 17 g  17 g Oral Daily Standley Brooking, MD   17 g at 07/03/12 [phone]  . potassium chloride SA (K-DUR,KLOR-CON) CR tablet 20 mEq  20 mEq Oral BID Standley Brooking, MD   20  mEq at 07/03/12 2237  . senna (SENOKOT) tablet 8.6 mg  1 tablet Oral Daily PRN Standley Brooking, MD   8.6 mg at 07/03/12 0644  . tiotropium (SPIRIVA) inhalation capsule 18 mcg  18 mcg Inhalation Daily Standley Brooking, MD   18 mcg at 07/03/12 0915  . traMADol (ULTRAM) tablet 50 mg  50 mg Oral Q6H PRN Abelino Derrick, PA   50 mg at 07/03/12 0052  . zolpidem (AMBIEN) tablet 5 mg  5 mg Oral QHS PRN Standley Brooking, MD   5 mg at 06/29/12 2208  . DISCONTD: 0.9 %  sodium chloride infusion  250 mL Intravenous PRN Wilburt Finlay, PA      . DISCONTD: 0.9 %  sodium chloride infusion   Intravenous Continuous Wilburt Finlay, PA 10 mL/hr at 07/04/12 0618 10 mL/hr at 07/04/12 0618  . DISCONTD: heparin ADULT infusion 100 units/mL (25000 units/250 mL)  700 Units/hr Intravenous Continuous Alinda Money, MD 8 mL/hr at 07/03/12 1131 800 Units/hr at 07/03/12 1131  . DISCONTD: sodium chloride 0.9 % injection 3 mL  3 mL Intravenous Q12H Wilburt Finlay, PA       . DISCONTD: sodium chloride 0.9 % injection 3 mL  3 mL Intravenous PRN Wilburt Finlay, PA        PE: General appearance: alert, cooperative and no distress  Lungs: clear to auscultation bilaterally  Heart: irregularly irregular rhythm  Extremities: No LEE  Pulses: 2+ and symmetric  Skin: Warm and dry.  Neurologic: Grossly normal    Lab Results:   Basename 07/04/12 0527 07/03/12 0640  WBC 7.3 7.9  HGB 9.8* 10.5*  HCT 27.7* 29.7*  PLT 168 167   BMET  Basename 07/04/12 0527 07/02/12 0622  NA 122* 121*  K 4.4 3.7  CL 85* 84*  CO2 26 27  GLUCOSE 94 91  BUN 24* 26*  CREATININE 1.04 1.39*  CALCIUM 9.0 8.7   PT/INR  Basename 07/04/12 0527 07/03/12 0640 07/02/12 0622  LABPROT 18.5* 19.4* 23.0*  INR 1.51* 1.61* 2.00*    Assessment/Plan  Principal Problem:  *Systolic and diastolic CHF, acute on chronic, (BNP 3800) Active Problems:  Hyponatremia, history of chronic hyponatremia  Elevated troponin, NSTEMI vs secondary elevation from CHF  Normocytic anemia  Atrial fibrillation, chronic  Trigeminal neuralgia  Moderate to severe pulmonary hypertension, PA pressure in 70s June 2013  Elevated INR  Cardiomyopathy, EF 40-45% 2D June 2013, possibly ischemic with AS WMA  LBBB (left bundle branch block)  Chronic anticoagulation  HTN (hypertension)  Plan:  Doing well.  INR appropriate for left and right heart cath today.  Afib, chronic, controlled rate.   LOS: 5 days    Angela Franco 07/04/2012 11:38 AM

## 2012-07-04 NOTE — H&P (Signed)
     [facility]          INTERVAL PROCEDURE H&P   History and Physical Interval Note:  07/04/2012 10:30 AM  Angela Franco has presented today for their planned procedure. The various methods of treatment have been discussed with the patient and family. After consideration of risks, benefits and other options for treatment, the patient has consented to the procedure.  The patients' outpatient history has been reviewed, patient examined, and no change in status from most recent office note within the past 30 days. I have reviewed the patients' chart and labs and will proceed as planned. Questions were answered to the patient's satisfaction.   Angela Nose, MD, [facility] Attending Cardiologist [facility]  Angela Franco 07/04/2012, 10:30 AM

## 2012-07-04 NOTE — Progress Notes (Addendum)
ANTICOAGULATION CONSULT NOTE - Follow Up Consult  Pharmacy Consult for Coumadin/Tolvaptan Indication: atrial fibrillation  Allergies  Allergen Reactions  . Penicillins Hives    Patient Measurements: Height: 5' (152.4 cm) Weight: 126 lb 8 oz (57.38 kg) (scale b) IBW/kg (Calculated) : 45.5    Vital Signs: Temp: 97.8 F (36.6 C) (08/01 1049) Temp src: Oral (08/01 1049) BP: 129/90 mmHg (08/01 1049) Pulse Rate: 163  (08/01 1326)  Labs:  Basename 07/04/12 0527 07/03/12 0640 07/02/12 1802 07/02/12 0622  HGB 9.8* 10.5* -- --  HCT 27.7* 29.7* -- --  PLT 168 167 -- --  APTT -- -- -- --  LABPROT 18.5* 19.4* -- 23.0*  INR 1.51* 1.61* -- 2.00*  HEPARINUNFRC 0.92* 0.76* 0.17* --  CREATININE 1.04 -- -- 1.39*  CKTOTAL -- -- -- --  CKMB -- -- -- --  TROPONINI -- -- -- --    Estimated Creatinine Clearance: 34.3 ml/min (by C-G formula based on Cr of 1.04).     Assessment: Patient is a 78 y.o female with afib who has been receiving Septra at home for an infection that developed after skin CA excision. Patient presented with supratherapeutic INR of 5.26 likely d/t interaction between septra and coumadin.Septra now discontinued. Coumadin on hold for cath and heparin  started 7/30 as INR 2.0. Post cath heparin continued at 700  Units/hr  coumadin to be restarted INR 1.51 pharmacy to follow tolvaptan  Goal of Therapy: Heparin level 0.3-0.7  INR 2-3 Monitor platelets by anticoagulation protocol: Yes   Plan: Continue heparin at 700 units/hr HL at 2100  Coumadin 5 mg today Daily Pt/INR Follow Na levels  Lucille Passy 07/04/2012,4:32 PM

## 2012-07-04 NOTE — Clinical Documentation Improvement (Signed)
GENERIC DOCUMENTATION CLARIFICATION QUERY  THIS DOCUMENT IS NOT A PERMANENT PART OF THE MEDICAL RECORD   Please update your documentation within the medical record to reflect your response to this query.                                                                                         07/04/12   Dr. Earlene Plater and/or Associates,   "Elevated troponin, NSTEMI vs secondary elevation from CHF" documented in Hospitalist and   Cardiology Progress Notes.  Right and Left Heart Cath performed 07/04/12.   Based on your clinical judgment, please document in the progress notes and discharge summary if the  diagnosis of "NSTEMI  has been ruled out or confirmed during this admission.    Reviewed: additional documentation in the medical record  Thank You,  Jerral Ralph  RN BSN CCDS Certified Clinical Documentation Specialist:  Pager  Health Information Management [place]  O RESPOND TO THE THIS QUERY, FOLLOW THE INSTRUCTIONS BELOW:  1. If needed, update documentation for the patient's encounter via the notes activity.  2. Access this query again and click edit on the In [employer].  3. After updating, or not, click F2 to complete all highlighted (required) fields concerning your review. Select "additional documentation in the medical record" OR "no additional documentation provided".  4. Click Sign note button.  5. The deficiency will fall out of your In Basket *Please let us know if you are not able to complete this workflow by phone or e-mail (listed below).

## 2012-07-05 DIAGNOSIS — L03211 Cellulitis of face: Secondary | ICD-10-CM

## 2012-07-05 LAB — BASIC METABOLIC PANEL
Chloride: 92 mEq/L — ABNORMAL LOW (ref 96–112)
GFR calc Af Amer: 59 mL/min — ABNORMAL LOW (ref >90)
GFR calc non Af Amer: 51 mL/min — ABNORMAL LOW (ref >90)
Potassium: 4.8 mEq/L (ref 3.5–5.1)
Sodium: 127 mEq/L — ABNORMAL LOW (ref 135–145)

## 2012-07-05 LAB — HEPARIN LEVEL (UNFRACTIONATED)
Heparin Unfractionated: 0.19 IU/mL — ABNORMAL LOW (ref 0.30–0.70)
Heparin Unfractionated: 0.23 IU/mL — ABNORMAL LOW (ref 0.30–0.70)
Heparin Unfractionated: 0.25 IU/mL — ABNORMAL LOW (ref 0.30–0.70)

## 2012-07-05 LAB — CBC
MCHC: 34.9 g/dL (ref 30.0–36.0)
Platelets: 155 10*3/uL (ref 150–400)
RDW: 14.1 % (ref 11.5–15.5)
WBC: 5.5 10*3/uL (ref 4.0–10.5)

## 2012-07-05 LAB — PROTIME-INR
INR: 1.74 — ABNORMAL HIGH (ref 0.00–1.49)
Prothrombin Time: 20.7 seconds — ABNORMAL HIGH (ref 11.6–15.2)

## 2012-07-05 MED ORDER — WARFARIN SODIUM 5 MG PO TABS
5.0000 mg | ORAL_TABLET | Freq: Once | ORAL | Status: AC
  Administered 2012-07-05: 5 mg via ORAL
  Filled 2012-07-05: qty 1

## 2012-07-05 MED ORDER — TUBERCULIN PPD 5 UNIT/0.1ML ID SOLN
5.0000 [IU] | Freq: Once | INTRADERMAL | Status: AC
  Administered 2012-07-05: 5 [IU] via INTRADERMAL
  Filled 2012-07-05: qty 0.1

## 2012-07-05 MED ORDER — HEPARIN (PORCINE) IN NACL 100-0.45 UNIT/ML-% IJ SOLN
900.0000 [IU]/h | INTRAMUSCULAR | Status: DC
  Administered 2012-07-05: 800 [IU]/h via INTRAVENOUS
  Filled 2012-07-05 (×3): qty 250

## 2012-07-05 MED ORDER — LORAZEPAM 0.5 MG PO TABS
0.5000 mg | ORAL_TABLET | Freq: Once | ORAL | Status: AC
  Administered 2012-07-05: 0.5 mg via ORAL
  Filled 2012-07-05: qty 1

## 2012-07-05 NOTE — Progress Notes (Signed)
Subjective: Less breathless, more energetic today. Not much weight change since yesterday and only 600 mL net negative balance.  Objective: Vital signs in last 24 hours: Temp:  [97.8 F (36.6 C)-99 F (37.2 C)] 97.8 F (36.6 C) (08/02 1016) Pulse Rate:  [83-163] 88  (08/02 1016) Resp:  [18] 18  (08/01 1330) BP: (102-155)/(61-105) 122/76 mmHg (08/02 1016) SpO2:  [93 %-100 %] 93 % (08/02 1016) Weight:  [57.244 kg (126 lb 3.2 oz)] 57.244 kg (126 lb 3.2 oz) (08/02 4098) Weight change: -0.136 kg (-4.8 oz) Last BM Date: 07/05/12 Intake/Output from previous day: -506 08/01 0701 - 08/02 0700 In: 32.4 [I.V.:32.4] Out: 650 [Urine:650] Intake/Output this shift: Total I/O In: 480 [P.O.:480] Out: -   PE:  General appearance: alert, cooperative and no distress Neck: JVD - 6 cm above sternal notch, no adenopathy, no carotid bruit, supple, symmetrical, trachea midline and thyroid not enlarged, symmetric, no tenderness/mass/nodules Lungs: clear to auscultation bilaterally Heart: irregularly irregular rhythm Abdomen: soft, non-tender; bowel sounds normal; no masses,  no organomegaly Extremities: edema 1+ symmetrically Skin: Skin color, texture, turgor normal. No rashes or lesions Neurologic: Grossly normal     Lab Results:  Basename 07/05/12 0920 07/04/12 0527  WBC 5.5 7.3  HGB 9.8* 9.8*  HCT 28.1* 27.7*  PLT 155 168   BMET  Basename 07/05/12 0920 07/05/12 0003 07/04/12 0527  NA 127* 124* --  K 4.8 -- 4.4  CL 92* -- 85*  CO2 25 -- 26  GLUCOSE 141* -- 94  BUN 23 -- 24*  CREATININE 1.01 -- 1.04  CALCIUM 9.2 -- 9.0     EKG: Orders placed during the hospital encounter of 06/29/12  . EKG 12-LEAD  . EKG 12-LEAD  . EKG  . EKG 12-LEAD  . EKG 12-LEAD  . EKG 12-LEAD  . EKG 12-LEAD  . EKG 12-LEAD    Studies/Results: 07/04/12: 1. Angiographically normal coronary arteries.  2. LVEF of 30-35% with global hypokinesis  3. Elevated right and left heart pressures with some data  to support restrictive physiology.  4. Reduced cardiac output.  5. Mild pulmonary hypertension (mixed pulmonary venous and pulmonary arterial hypertension by TPG) - elevated PVR.  6. Hyponatremia may be secondary to heart failure.  7. Persistent a-fib with RVR   Medications: I have reviewed the patient's current medications.    [name] aspirin EC  81 mg Oral Daily  . atorvastatin  20 mg Oral q1800  . carbamazepine  100 mg Oral BID  . carvedilol  6.25 mg Oral BID WC  . docusate sodium  100 mg Oral BID  . furosemide  40 mg Oral Daily  . heparin      . lidocaine      . loratadine  10 mg Oral Daily  . losartan  50 mg Oral Daily  . metoprolol      . mupirocin cream   Topical TID  . nitroGLYCERIN      . polyethylene glycol  17 g Oral Daily  . potassium chloride  20 mEq Oral BID  . tiotropium  18 mcg Inhalation Daily  . tolvaptan  15 mg Oral Q24H  . tuberculin  5 Units Intradermal Once  . warfarin  5 mg Oral ONCE-1800  . warfarin  5 mg Oral ONCE-1800  . Warfarin - Pharmacist Dosing Inpatient   Does not apply q1800  . DISCONTD: amLODipine  5 mg Oral Daily  . DISCONTD: carvedilol  3.125 mg Oral BID WC  . DISCONTD: demeclocycline  300 mg Oral Q12H  . DISCONTD: sodium chloride  3 mL Intravenous Q12H   Assessment/Plan: Principal Problem:  *Systolic and diastolic CHF, acute on chronic, (BNP 3800) Active Problems:  Hypo-osmolality and hyponatremia  Elevated troponin, NSTEMI vs secondary elevation from CHF  Normocytic anemia  Atrial fibrillation, chronic  Trigeminal neuralgia  Moderate to severe pulmonary hypertension, PA pressure in 70s June 2013  Elevated INR  Non-ischemic cardiomyopathy  LBBB (left bundle branch block)  Chronic anticoagulation  HTN (hypertension)  PLAN: Cardiac cath yesterday with above results -pt was placed on Tolvaptan Coreg increased to 6.25 BID.   Afib continues with rates in the 80's and occ pause  Add antiarrythmic? TEE DCCV? See Dr. Blanchie Dessert note  Once  HR controlled possible cardiac MRI to eval. For possible infiltrative disease to explain restrictive cardiomyopathy.  Heparin/coumadin crossover.     MD to examine, Pt currently in bathroom.  Results of cath not yet explained to the patient.      LOS: 6 days   Angela Franco,LAURA R 07/05/2012, 10:45 AM  I have seen and examined the patient along with [facility] R, NP.  I have reviewed the chart, notes and new data.  I agree with NP's note.  Key new complaints: less breathless, very tired; does not want more procedures Key examination changes: remains in AF with RVR, 100-115 at rest Key new findings / data: creat still OK at 1.0, Sodium climbing at a reasonable pace  PLAN: Monitor on PO diuretics to make sure she does not gain volume. Continue sodium monitoring. Needs better rate control - betablocker was just increased.  She is very skeptical about prospects of cardioversion and I share her lack of enthusiasm. She may have chronic/permanent AF(we need to get records from Dr. Daphene Calamity in [place] and Dr. Ellis-Cardiology in [place]). I agree she would do better in NSR, but doubt we can maintain her there. Note low EF and biatrial enlargement.  Premature to consider MRI as heart rate is too fast. Clinically suspect cardiac amyloidosis, although there is a small chance she has tachycardia related cardiomyopathy. The severity of her PAH suggests chronic restrictive cardiomyopathy.  Thurmon Fair, MD, [facility] [phone] 07/05/2012, 4:14 PM

## 2012-07-05 NOTE — Progress Notes (Signed)
TB skin giving rechk time 48hrs from 1512pm

## 2012-07-05 NOTE — Progress Notes (Signed)
[facility] PROGRESS NOTE  Angela Franco ZOX:[id number] DOB: 1929-11-25 DOA: 06/29/2012 PCP: No primary provider on file. None in [place]. Former PCP [name], MD [place], [place]  Assessment/Plan: 1. Hyponatremia: Per conversation with nephrology started demeclocycline on 7/30- 8/1.  Asymptomatic. Most likely secondary to Tegretol, possibly complicated by recent addition of Trazodone. Trazodone discontinued on 7/30. Continue Fluid restriction. Patient has severe pain when taken off Tegretol and therefore refuses discontinued. Patient was started on [facility]  [facility]iology on 8/1.   Sodium is improving. 2. Acute systolic heart failure: Continues to improve. Complicated by severe pulmonary hypertension. Mildly symptomatic. Continue diuresis as per cardiology  Recommendation. 3. Severe pulmonary hypertension: Etiology unclear. No history to suggest VTE (INR >5 on admission).  4. Elevated troponin/chest discomfort: Appreciate cardiology evaluation and recommendations. Continue ASA, Coreg.  Cath yesterday results reviewed. 5. Normocytic anemia: Stable. 6. Atrial fibrillation: Continue carvedilol, losartan. Warfarin on hold for Cath.  Patient on heparin.  Question cardioversion per cardiology. 7. Warfarin-induced coagulopathy: INR high on admission, without bleeding. Probably high secondary to recent start of Bactrim as an outpatient.   8. History of cellulitis: Post procedure infection of the nose.  Appears resolved. Discontinue antibiotics on 7/30. 9. Trigeminal neuralgia: Continue carbamazepine--patient refuses to discontinue and other agents have been ineffective-- resulting in excruciating pain.  Code Status: DNR Family Communication: [name] (Daughter) [phone] [phone] Disposition Plan: Home when improved    Brief narrative: 78 year old woman with history of hyponatremia (on Carbamazepine) last hospitalized January of this year for the same (responded to  fluid restriction) presented today with 1-2 week history of gradual decline, lethargy, some confusion and increasing lower extremity edema. She reports she is chronic hyponatremia but cannot recall her baseline. She refuses to discontinue carbamazepine because of intractable pain without it. She has been told that she is "right heart failure" was not been on diuretics because of her history of hyponatremia. She denies any previous cardiac history other than atrial fibrillation.   Record Review (obtained by fax from PCP's office):  05/15/12 Hemoglobin 10.9, sodium 126, chloride 88  05/15/12 2-d echocardiogram: LVEF 40-45%, RV pressure 73, severe pulmonary hypertension, moderate tricuspid regurgitation, right atrium moderate-severe dilatation, moderate anterior/anteroseptal hypokinesis.  EKG (undated): SR, LBBB, anterior infarct, old. Overall appearance similar to today's EKG.  Consultants:  Cardiology  Physical therapy--no follow-up  Procedures:  R/L heart cath 8/1  HPI/Subjective: Afebrile, VSS. Feels somewhat better.  Patient is sitting up in chair by the window daughter is at the bedside.  Objective: Filed Vitals:   07/03/12 0600 07/03/12 0921 07/03/12 0950 07/03/12 1334  BP: 115/68  129/72 131/82  Pulse: 62   109  Temp: 97.1 F (36.2 C)   99.3 F (37.4 C)  TempSrc: Oral   Oral  Resp: 18   19  Height:      Weight:      SpO2: 100% 96%  97%    Intake/Output Summary (Last 24 hours) at 07/03/12 1355 Last data filed at 07/03/12 1300  Gross per 24 hour  Intake  497.5 ml  Output   2275 ml  Net -1777.5 ml   Wt Readings from Last 3 Encounters:  07/03/12 57.38 kg (126 lb 8 oz)  06/28/12 60.51 kg (133 lb 6.4 oz)    Exam:   General:  Appears calm and comfortable  Cardiovascular: irreg, no m/r/g. LE edema (1+).  Respiratory: Anterior, rales normal respiratory effort, no w/r/r.  Extremities: Trace edema bilaterally  Data Reviewed: Basic Metabolic Panel:  Lab 07/02/12  0622 07/01/12 0641 06/30/12 0131 06/29/12 1205  NA 121* 119* 121* 120*  K 3.7 3.7 3.5 3.8  CL 84* 83* 83* 82*  CO2 27 25 25 25   GLUCOSE 91 83 110* 97  BUN 26* 20 15 16   CREATININE 1.39* 1.35* 1.14* 1.02  CALCIUM 8.7 8.6 8.8 9.3  MG -- -- -- --  PHOS -- -- -- --   CBC:  Lab 07/03/12 0640 06/29/12 1205  WBC 7.9 7.2  NEUTROABS -- 5.8  HGB 10.5* 10.7*  HCT 29.7* 30.6*  MCV 85.6 85.0  PLT 167 141*   Cardiac Enzymes:  Lab 07/01/12 0640 06/30/12 0900 06/30/12 0132 06/29/12 1749 06/29/12 1423  CKTOTAL -- 22 20 21  --  CKMB -- 1.3 1.2 1.3 --  CKMBINDEX -- -- -- -- --  TROPONINI <0.30 0.32* <0.30 0.63* 0.59*   BNP (last 3 results)  Basename 06/29/12 1423  PROBNP 3820.0*   Studies:  Scheduled Meds:    . amLODipine  5 mg Oral Daily  . aspirin EC  81 mg Oral Daily  . atorvastatin  20 mg Oral q1800  . carbamazepine  100 mg Oral BID  . carvedilol  3.125 mg Oral BID WC  . demeclocycline  300 mg Oral Q12H  . docusate sodium  100 mg Oral BID  . furosemide  40 mg Oral Daily  . loratadine  10 mg Oral Daily  . losartan  50 mg Oral Daily  . mupirocin cream   Topical TID  . polyethylene glycol  17 g Oral Daily  . potassium chloride  20 mEq Oral BID  . tiotropium  18 mcg Inhalation Daily   Continuous Infusions:    . heparin 800 Units/hr (07/03/12 1131)  . DISCONTD: heparin 800 Units/hr (07/02/12 1053)      Molli Posey, MD  [facility] Pager [phone]. If 7PM-7AM, please contact night-coverage at [email or web address], password [facility] 07/03/2012, 1:55 PM  LOS: 4 days   Time spent: 25 minutes

## 2012-07-05 NOTE — Clinical Social Work Psychosocial (Signed)
Clinical Social Work Department BRIEF PSYCHOSOCIAL ASSESSMENT 07/05/2012  Patient:  Angela Franco, Angela Franco     Account Number:  [id number]     Admit date:  06/29/2012  Clinical Social Worker:  Juliette Mangle  Date/Time:  07/05/2012 09:30 AM  Referred by:  RN  Date Referred:  07/05/2012 Referred for  ALF Placement   Other Referral:   Interview type:  Family Other interview type:   Spoke with patient as well    PSYCHOSOCIAL DATA Living Status:  FAMILY Admitted from facility:   Level of care:   Primary support name:  Huneycutt,Deborah Primary support relationship to patient:  CHILD, ADULT Degree of support available:   Strong    CURRENT CONCERNS Current Concerns  Post-Acute Placement   Other Concerns:    SOCIAL WORK ASSESSMENT / PLAN CSW received a referral from RN that patient's daughter wanted to speak with a CSW. CSW met with patient and patient's daughter at the bedside. With patient's permission, CSW spoke with pt's daughter in the hallway. Patient's daughter reported that her father and patient's husband passed two weeks ago and her mother got sick a week after he passed. After his passing, the Patient's family relocated their mother to [facility] and have a arranged a bed for her at the ALF, [facility].  The daughter and patient were both tearful and reported that they have been through a lot in the last two weeks. The CSW provided comfort and assured them that we would take good care of them. CSW will start paperwork for the ALF and will assist with all d/c needs. CSW requested that a TB test be placed immediately.   Assessment/plan status:  Psychosocial Support/Ongoing Assessment of Needs Other assessment/ plan:   Information/referral to community resources:   none noted    PATIENT'S/FAMILY'S RESPONSE TO PLAN OF CARE: Patient and patient's daughter were very appreciative of comfort and assistance provided by CSW. CSW wll continue to follow and start all necessary paperwork  for ALF.    Sabino Niemann, MSW, [employer] [phone]

## 2012-07-05 NOTE — Progress Notes (Signed)
ANTICOAGULATION CONSULT NOTE - Follow Up Consult  Pharmacy Consult for Coumadin + heparin Indication: atrial fibrillation  Allergies  Allergen Reactions  . Penicillins Hives   Patient Measurements: Height: 5' (152.4 cm) Weight: 126 lb 3.2 oz (57.244 kg) (scale B) IBW/kg (Calculated) : 45.5   Vital Signs: Temp: 97.8 F (36.6 C) (08/02 1016) Temp src: Oral (08/02 1016) BP: 122/76 mmHg (08/02 1016) Pulse Rate: 88  (08/02 1016)  Labs:  Alvira Philips 07/05/12 0920 07/05/12 0914 07/04/12 2348 07/04/12 0527 07/03/12 0640  HGB 9.8* -- -- 9.8* --  HCT 28.1* -- -- 27.7* 29.7*  PLT 155 -- -- 168 167  APTT -- -- -- -- --  LABPROT 20.7* -- -- 18.5* 19.4*  INR 1.74* -- -- 1.51* 1.61*  HEPARINUNFRC -- 0.23* 0.19* 0.92* --  CREATININE 1.01 -- -- 1.04 --  CKTOTAL -- -- -- -- --  CKMB -- -- -- -- --  TROPONINI -- -- -- -- --    Estimated Creatinine Clearance: 35.2 ml/min (by C-G formula based on Cr of 1.01).  Assessment: Patient is a 78 y.o female with afib who had been receiving Septra at home for an infection that developed after skin CA excision. Patient presented with supratherapeutic INR of 5.26 likely d/t interaction between septra and coumadin.Septra now discontinued.   S/p cardiac cath yesterday and both heparin + coumadin have been resumed. Heparin level today is slightly subtherapeutic at 0.23, INR is also subtherapeutic at 1.74. No bleeding noted. Pt is anemic but CBC is stable.   Goal of Therapy: Heparin level 0.3-0.7  INR 2-3 Monitor platelets by anticoagulation protocol: Yes   Plan: 1. Increase heparin gtt to 800 units/hr 2. Check an 8 hour heparin level 3. Coumadin 5mg  PO x 1 tonight 4. F/u AM INR  Koury Roddy, Drake Leach 07/05/2012,10:47 AM

## 2012-07-05 NOTE — Progress Notes (Signed)
ANTICOAGULATION CONSULT NOTE - Follow Up Consult  Pharmacy Consult for Coumadin + heparin Indication: atrial fibrillation  Allergies  Allergen Reactions  . Penicillins Hives   Patient Measurements: Height: 5' (152.4 cm) Weight: 126 lb 3.2 oz (57.244 kg) (scale B) IBW/kg (Calculated) : 45.5   Vital Signs: Temp: 97.5 F (36.4 C) (08/02 2043) Temp src: Oral (08/02 2043) BP: 123/68 mmHg (08/02 2043) Pulse Rate: 106  (08/02 2043)  Labs:  Alvira Philips 07/05/12 1905 07/05/12 0920 07/05/12 0914 07/04/12 2348 07/04/12 0527 07/03/12 0640  HGB -- 9.8* -- -- 9.8* --  HCT -- 28.1* -- -- 27.7* 29.7*  PLT -- 155 -- -- 168 167  APTT -- -- -- -- -- --  LABPROT -- 20.7* -- -- 18.5* 19.4*  INR -- 1.74* -- -- 1.51* 1.61*  HEPARINUNFRC 0.25* -- 0.23* 0.19* -- --  CREATININE -- 1.01 -- -- 1.04 --  CKTOTAL -- -- -- -- -- --  CKMB -- -- -- -- -- --  TROPONINI -- -- -- -- -- --    Estimated Creatinine Clearance: 35.2 ml/min (by C-G formula based on Cr of 1.01).  Assessment: Patient is a 78 y.o female on heparin for afib, heparin level remains subtherapeutic. No bleeding noted.  Goal of Therapy: Heparin level 0.3-0.7  INR 2-3 Monitor platelets by anticoagulation protocol: Yes   Plan: 1. Increase heparin gtt to 900 units/hr 2. Check an 8 hour heparin level at 5am   Bayard Hugger, PharmD, BCPS  Clinical Pharmacist  Pager: [phone]  07/05/2012,8:55 PM

## 2012-07-05 NOTE — Progress Notes (Signed)
Advanced Diet to Full lip. Per order to advance as tolerated. Well continue to monitor

## 2012-07-05 NOTE — Progress Notes (Signed)
ANTICOAGULATION CONSULT NOTE - Follow Up Consult  Pharmacy Consult for heparin Indication: atrial fibrillation  Labs:  Basename 07/04/12 2348 07/04/12 0527 07/03/12 0640 07/02/12 0622  HGB -- 9.8* 10.5* --  HCT -- 27.7* 29.7* --  PLT -- 168 167 --  APTT -- -- -- --  LABPROT -- 18.5* 19.4* 23.0*  INR -- 1.51* 1.61* 2.00*  HEPARINUNFRC 0.19* 0.92* 0.76* --  CREATININE -- 1.04 -- 1.39*  CKTOTAL -- -- -- --  CKMB -- -- -- --  TROPONINI -- -- -- --    Assessment: 78yo female subtherapeutic on heparin after resumed post-cath for Afib; has been supratherapeutic at 800 units/hr during this admission.  Goal of Therapy:  Heparin level 0.3-0.7 units/ml   Plan:  Will increase heparin gtt by 1 unit/kg/hr to 750 units/hr and check level in 8hr.  Colleen Can PharmD BCPS 07/05/2012,1:28 AM

## 2012-07-06 ENCOUNTER — Inpatient Hospital Stay (HOSPITAL_COMMUNITY): Payer: Medicare Other

## 2012-07-06 DIAGNOSIS — I10 Essential (primary) hypertension: Secondary | ICD-10-CM

## 2012-07-06 DIAGNOSIS — R109 Unspecified abdominal pain: Secondary | ICD-10-CM | POA: Diagnosis not present

## 2012-07-06 DIAGNOSIS — R110 Nausea: Secondary | ICD-10-CM | POA: Diagnosis not present

## 2012-07-06 LAB — HEPARIN LEVEL (UNFRACTIONATED): Heparin Unfractionated: 0.55 IU/mL (ref 0.30–0.70)

## 2012-07-06 LAB — CBC
MCV: 86 fL (ref 78.0–100.0)
Platelets: 155 10*3/uL (ref 150–400)
RDW: 14 % (ref 11.5–15.5)
WBC: 5.9 10*3/uL (ref 4.0–10.5)

## 2012-07-06 LAB — BASIC METABOLIC PANEL
Calcium: 8.8 mg/dL (ref 8.4–10.5)
Chloride: 89 mEq/L — ABNORMAL LOW (ref 96–112)
Creatinine, Ser: 1.09 mg/dL (ref 0.50–1.10)
GFR calc Af Amer: 54 mL/min — ABNORMAL LOW (ref >90)

## 2012-07-06 LAB — CARDIAC PANEL(CRET KIN+CKTOT+MB+TROPI): CK, MB: 1.6 ng/mL (ref 0.3–4.0)

## 2012-07-06 LAB — PROTIME-INR: Prothrombin Time: 26.7 seconds — ABNORMAL HIGH (ref 11.6–15.2)

## 2012-07-06 MED ORDER — WARFARIN SODIUM 3 MG PO TABS
3.0000 mg | ORAL_TABLET | Freq: Once | ORAL | Status: AC
  Administered 2012-07-06: 3 mg via ORAL
  Filled 2012-07-06: qty 1

## 2012-07-06 MED ORDER — LORAZEPAM 0.5 MG PO TABS
0.5000 mg | ORAL_TABLET | Freq: Every evening | ORAL | Status: DC | PRN
  Administered 2012-07-07: 0.5 mg via ORAL
  Filled 2012-07-06: qty 1

## 2012-07-06 MED ORDER — GI COCKTAIL ~~LOC~~
30.0000 mL | Freq: Once | ORAL | Status: DC
  Filled 2012-07-06: qty 30

## 2012-07-06 MED ORDER — IOHEXOL 300 MG/ML  SOLN
80.0000 mL | Freq: Once | INTRAMUSCULAR | Status: AC | PRN
  Administered 2012-07-06: 80 mL via INTRAVENOUS

## 2012-07-06 MED ORDER — IOHEXOL 300 MG/ML  SOLN
20.0000 mL | INTRAMUSCULAR | Status: AC
  Administered 2012-07-06 (×2): 20 mL via ORAL

## 2012-07-06 MED ORDER — HYDROMORPHONE HCL PF 1 MG/ML IJ SOLN
0.5000 mg | INTRAMUSCULAR | Status: DC | PRN
  Administered 2012-07-06: 0.5 mg via INTRAVENOUS
  Filled 2012-07-06: qty 1

## 2012-07-06 MED ORDER — FUROSEMIDE 80 MG PO TABS
80.0000 mg | ORAL_TABLET | Freq: Every day | ORAL | Status: DC
  Administered 2012-07-07 – 2012-07-08 (×2): 80 mg via ORAL
  Filled 2012-07-06 (×2): qty 1

## 2012-07-06 NOTE — Progress Notes (Signed)
ANTICOAGULATION CONSULT NOTE - Follow Up Consult  Pharmacy Consult for Coumadin + heparin Indication: atrial fibrillation  Allergies  Allergen Reactions  . Penicillins Hives   Patient Measurements: Height: 5' (152.4 cm) Weight: 126 lb 6.4 oz (57.335 kg) (scale B) IBW/kg (Calculated) : 45.5   Vital Signs: Temp: 97.7 F (36.5 C) (08/03 0613) Temp src: Oral (08/03 0613) BP: 135/92 mmHg (08/03 0613) Pulse Rate: 115  (08/03 0613)  Labs:  Angela Franco 07/06/12 4098 07/05/12 1905 07/05/12 0920 07/05/12 0914 07/04/12 0527  HGB 8.9* -- 9.8* -- --  HCT 25.7* -- 28.1* -- 27.7*  PLT 155 -- 155 -- 168  APTT -- -- -- -- --  LABPROT 26.7* -- 20.7* -- 18.5*  INR 2.42* -- 1.74* -- 1.51*  HEPARINUNFRC 0.55 0.25* -- 0.23* --  CREATININE 1.09 -- 1.01 -- 1.04  CKTOTAL -- -- -- -- --  CKMB -- -- -- -- --  TROPONINI -- -- -- -- --    Estimated Creatinine Clearance: 32.6 ml/min (by C-G formula based on Cr of 1.09).  Home Coumadin dose:  7.5 mg po daily except 5 mg on Tue and Fri  Assessment: Patient is a 78 y.o female with afib who had been receiving Septra at home for an infection that developed after skin CA excision. Patient presented with supratherapeutic INR of 5.26 7/27 likely d/t interaction between septra and coumadin.Septra now discontinued.   S/p cardiac cath 8/1 and both heparin + coumadin have been resumed. Heparin level today is therapeutic at 0.55, INR is also therapeutic at 2.42 (jump from 1.74). H/H also decreased, but no overt bleeding noted, platelets remain stable.   Goal of Therapy: Heparin level 0.3-0.7  INR 2-3 Monitor platelets by anticoagulation protocol: Yes   Plan: 1. Continue heparin at 900 units/hr 2. Check an 8 hour confirmatory heparin level (since has been supra-therapeutic on 900 units/hr and H/H dropped) 3. Will likely be able to discontinue heparin tomorrow AM if INR remains therapeutic (MD: could consider stopping today) 3. Coumadin 3mg  PO x 1 tonight 4.  F/u AM INR  Rolland Porter, Pharm.D., BCPS Clinical Pharmacist Pager: [phone] 07/06/2012,7:55 AM

## 2012-07-06 NOTE — Progress Notes (Signed)
Subjective:   Objective: Vital signs in last 24 hours: Temp:  [97.5 F (36.4 C)-97.8 F (36.6 C)] 97.7 F (36.5 C) (08/03 1610) Pulse Rate:  [88-115] 115  (08/03 0613) Resp:  [16-18] 16  (08/03 0613) BP: (114-135)/(68-92) 135/92 mmHg (08/03 0613) SpO2:  [93 %-99 %] 94 % (08/03 0613) Weight:  [57.335 kg (126 lb 6.4 oz)] 57.335 kg (126 lb 6.4 oz) (08/03 9604) Weight change: 0.091 kg (3.2 oz) Last BM Date: 07/05/12 Intake/Output from previous day:  +760 08/02 0701 - 08/03 0700 In: 960 [P.O.:960] Out: 200 [Urine:200] Intake/Output this shift:    PE: General: Neck: Heart: Lungs: Abd: Ext:    Lab Results:  Basename 07/06/12 0635 07/05/12 0920  WBC 5.9 5.5  HGB 8.9* 9.8*  HCT 25.7* 28.1*  PLT 155 155   BMET  Basename 07/06/12 0755 07/06/12 0635 07/05/12 0920  NA 128* 124* --  K -- 4.1 4.8  CL -- 89* 92*  CO2 -- 25 25  GLUCOSE -- 97 141*  BUN -- 22 23  CREATININE -- 1.09 1.01  CALCIUM -- 8.8 9.2      EKG: Orders placed during the hospital encounter of 06/29/12  . EKG 12-LEAD  . EKG 12-LEAD  . EKG  . EKG 12-LEAD  . EKG 12-LEAD  . EKG 12-LEAD  . EKG 12-LEAD  . EKG 12-LEAD    Studies/Results: No results found.  Medications: I have reviewed the patient's current medications.    [name] aspirin EC  81 mg Oral Daily  . atorvastatin  20 mg Oral q1800  . carbamazepine  100 mg Oral BID  . carvedilol  6.25 mg Oral BID WC  . docusate sodium  100 mg Oral BID  . furosemide  40 mg Oral Daily  . loratadine  10 mg Oral Daily  . LORazepam  0.5 mg Oral Once  . losartan  50 mg Oral Daily  . mupirocin cream   Topical TID  . polyethylene glycol  17 g Oral Daily  . potassium chloride  20 mEq Oral BID  . tiotropium  18 mcg Inhalation Daily  . tolvaptan  15 mg Oral Q24H  . tuberculin  5 Units Intradermal Once  . warfarin  3 mg Oral ONCE-1800  . warfarin  5 mg Oral ONCE-1800  . Warfarin - Pharmacist Dosing Inpatient   Does not apply q1800    Assessment/Plan: Principal Problem:  *Systolic and diastolic CHF, acute on chronic, (BNP 3800) Active Problems:  Hypo-osmolality and hyponatremia  Elevated troponin, NSTEMI vs secondary elevation from CHF  Normocytic anemia  Atrial fibrillation, chronic  Trigeminal neuralgia  Moderate to severe pulmonary hypertension, PA pressure in 70s June 2013  Elevated INR  Non-ischemic cardiomyopathy  LBBB (left bundle branch block)  Chronic anticoagulation  HTN (hypertension)  PLAN: Na improving,   Wt today 57.3  Down from pk of 61.3 Re faxing release of records to [facility] in [place], [place]     LOS: 7 days   INGOLD,LAURA R 07/06/2012, 9:25 AM   I have seen and examined the patient along with Nada Boozer, PA NP.  I have reviewed the chart, notes and new data.  I agree with PA/NP's note.  Key new complaints: eager to go home Key examination changes: no edema, but JVD still up a little; I/O slightly positive, HR in 90s at rest Key new findings / data: Na 128. INR 2.4.  PLAN: Improved CHF and ventricular rate control.  Still a little hypervolemic by exam,  increase diuretic dose.  No old records yet. Plans for TEE and CV on hold until we can clarify duration of arrhythmia.  Stop heparin, therapeutic INR on warfarin.  To [facility] on Monday. Hopefully we can get cardiac MRI before DC, but this can be an outpatient procedure.  Thurmon Fair, MD, [facility] and Vascular Center [phone] 07/06/2012, 10:16 AM

## 2012-07-06 NOTE — Progress Notes (Signed)
[facility] PROGRESS NOTE  Angela Franco YQM:[id number] DOB: 26-Sep-1929 DOA: 06/29/2012 PCP: No primary provider on file. None in [place]. Former PCP [name], MD [place], [place]  Assessment/Plan: 1. Hyponatremia: Per conversation with nephrology started demeclocycline on 7/30- 8/1.  Asymptomatic. Most likely secondary to Tegretol, possibly complicated by recent addition of Trazodone. Trazodone discontinued on 7/30. Continue Fluid restriction. Patient has severe pain when taken off Tegretol and therefore refuses discontinued. Patient was started on [facility] on 8/1.   Sodium is improving. 2. Acute systolic heart failure: Continues to improve. Complicated by severe pulmonary hypertension. Mildly symptomatic. Continue diuresis as per cardiology  Recommendation. 3. Severe pulmonary hypertension: Etiology unclear. No history to suggest VTE (INR >5 on admission).  4. Elevated troponin/chest discomfort: Appreciate cardiology evaluation and recommendations. Continue ASA, Coreg.  Cath yesterday results reviewed. 5. Normocytic anemia: Stable. 6. Atrial fibrillation: Continue carvedilol, losartan. Warfarin on hold for Cath.  Patient on heparin.  Question cardioversion per cardiology. 7. Warfarin-induced coagulopathy: INR high on admission, without bleeding. Probably high secondary to recent start of Bactrim as an outpatient.   8. History of cellulitis: Post procedure infection of the nose.  Appears resolved. Discontinue antibiotics on 7/30. 9. Trigeminal neuralgia: Continue carbamazepine--patient refuses to discontinue and other agents have been ineffective-- resulting in excruciating pain. 10. Nausea/abdominal pain-x-ray of the abdomen, start Protonix, GI cocktail. Check EKG and cardiac markers.  Code Status: DNR Family Communication: Angela Franco (Daughter) [phone] Disposition Plan: Home when improved    Brief narrative: 78 year old woman with  history of hyponatremia (on Carbamazepine) last hospitalized January of this year for the same (responded to fluid restriction) presented today with 1-2 week history of gradual decline, lethargy, some confusion and increasing lower extremity edema. She reports she is chronic hyponatremia but cannot recall her baseline. She refuses to discontinue carbamazepine because of intractable pain without it. She has been told that she is "right heart failure" was not been on diuretics because of her history of hyponatremia. She denies any previous cardiac history other than atrial fibrillation.   Record Review (obtained by fax from PCP's office):  05/15/12 Hemoglobin 10.9, sodium 126, chloride 88  05/15/12 2-d echocardiogram: LVEF 40-45%, RV pressure 73, severe pulmonary hypertension, moderate tricuspid regurgitation, right atrium moderate-severe dilatation, moderate anterior/anteroseptal hypokinesis.  EKG (undated): SR, LBBB, anterior infarct, old. Overall appearance similar to today's EKG.  Consultants:  Cardiology  Physical therapy--no follow-up  Procedures:  R/L heart cath 8/1  HPI/Subjective: Patient feels fine on rounds no complaints. Later that day nursing noted some nausea and abdominal pain.  Objective: Filed Vitals:   07/03/12 0600 07/03/12 0921 07/03/12 0950 07/03/12 1334  BP: 115/68  129/72 131/82  Pulse: 62   109  Temp: 97.1 F (36.2 C)   99.3 F (37.4 C)  TempSrc: Oral   Oral  Resp: 18   19  Height:      Weight:      SpO2: 100% 96%  97%    Intake/Output Summary (Last 24 hours) at 07/03/12 1355 Last data filed at 07/03/12 1300  Gross per 24 hour  Intake  497.5 ml  Output   2275 ml  Net -1777.5 ml   Wt Readings from Last 3 Encounters:  07/03/12 57.38 kg (126 lb 8 oz)  06/28/12 60.51 kg (133 lb 6.4 oz)    Exam:   General:  Appears calm and comfortable  Cardiovascular: irreg, no m/r/g. LE edema (1+).  Respiratory: Anterior, rales normal respiratory effort, no  w/r/r.  Abdomen: Soft nontender nondistended positive bowel sounds.  Extremities: Trace edema bilaterally  Data Reviewed: Basic Metabolic Panel:  Lab 07/02/12 4540 07/01/12 0641 06/30/12 0131 06/29/12 1205  NA 121* 119* 121* 120*  K 3.7 3.7 3.5 3.8  CL 84* 83* 83* 82*  CO2 27 25 25 25   GLUCOSE 91 83 110* 97  BUN 26* 20 15 16   CREATININE 1.39* 1.35* 1.14* 1.02  CALCIUM 8.7 8.6 8.8 9.3  MG -- -- -- --  PHOS -- -- -- --   CBC:  Lab 07/03/12 0640 06/29/12 1205  WBC 7.9 7.2  NEUTROABS -- 5.8  HGB 10.5* 10.7*  HCT 29.7* 30.6*  MCV 85.6 85.0  PLT 167 141*   Cardiac Enzymes:  Lab 07/01/12 0640 06/30/12 0900 06/30/12 0132 06/29/12 1749 06/29/12 1423  CKTOTAL -- 22 20 21  --  CKMB -- 1.3 1.2 1.3 --  CKMBINDEX -- -- -- -- --  TROPONINI <0.30 0.32* <0.30 0.63* 0.59*   BNP (last 3 results)  Basename 06/29/12 1423  PROBNP 3820.0*   Studies:  Scheduled Meds:    . amLODipine  5 mg Oral Daily  . aspirin EC  81 mg Oral Daily  . atorvastatin  20 mg Oral q1800  . carbamazepine  100 mg Oral BID  . carvedilol  3.125 mg Oral BID WC  . demeclocycline  300 mg Oral Q12H  . docusate sodium  100 mg Oral BID  . furosemide  40 mg Oral Daily  . loratadine  10 mg Oral Daily  . losartan  50 mg Oral Daily  . mupirocin cream   Topical TID  . polyethylene glycol  17 g Oral Daily  . potassium chloride  20 mEq Oral BID  . tiotropium  18 mcg Inhalation Daily   Continuous Infusions:    . heparin 800 Units/hr (07/03/12 1131)  . DISCONTD: heparin 800 Units/hr (07/02/12 1053)      Molli Posey, MD  [facility] Pager [phone]. If 7PM-7AM, please contact night-coverage at [email or web address], password [facility] 07/03/2012, 1:55 PM  LOS: 4 days   Time spent: 25 minutes

## 2012-07-06 NOTE — Progress Notes (Signed)
Previous shift reported that pt was disoriented after taking ambien on 07/04/12 and continued to get up and down to the bathroom with unsteady gait. MD notifed when pt requested sleeping med for anxiety med. Ativan given and noted effective. Pt and family would like to continue med prn.

## 2012-07-07 DIAGNOSIS — R109 Unspecified abdominal pain: Secondary | ICD-10-CM

## 2012-07-07 DIAGNOSIS — R110 Nausea: Secondary | ICD-10-CM

## 2012-07-07 DIAGNOSIS — R338 Other retention of urine: Secondary | ICD-10-CM | POA: Diagnosis not present

## 2012-07-07 LAB — BASIC METABOLIC PANEL
BUN: 23 mg/dL (ref 6–23)
CO2: 24 mEq/L (ref 19–32)
Chloride: 88 mEq/L — ABNORMAL LOW (ref 96–112)
Glucose, Bld: 85 mg/dL (ref 70–99)
Potassium: 4.4 mEq/L (ref 3.5–5.1)

## 2012-07-07 LAB — CBC
HCT: 26.5 % — ABNORMAL LOW (ref 36.0–46.0)
Hemoglobin: 8.9 g/dL — ABNORMAL LOW (ref 12.0–15.0)
MCH: 29 pg (ref 26.0–34.0)
MCHC: 33.6 g/dL (ref 30.0–36.0)

## 2012-07-07 LAB — URINALYSIS, ROUTINE W REFLEX MICROSCOPIC
Hgb urine dipstick: NEGATIVE
Ketones, ur: NEGATIVE mg/dL
Protein, ur: NEGATIVE mg/dL
Urobilinogen, UA: 0.2 mg/dL (ref 0.0–1.0)

## 2012-07-07 LAB — SODIUM
Sodium: 124 mEq/L — ABNORMAL LOW (ref 135–145)
Sodium: 125 mEq/L — ABNORMAL LOW (ref 135–145)

## 2012-07-07 MED ORDER — LORAZEPAM 0.5 MG PO TABS
0.5000 mg | ORAL_TABLET | Freq: Once | ORAL | Status: AC
  Administered 2012-07-07: 0.5 mg via ORAL
  Filled 2012-07-07: qty 1

## 2012-07-07 NOTE — Progress Notes (Addendum)
ANTICOAGULATION CONSULT NOTE - Follow Up Consult  Pharmacy Consult for Coumadin  Indication: atrial fibrillation  Allergies  Allergen Reactions  . Penicillins Hives   Patient Measurements: Height: 5' (152.4 cm) Weight: 126 lb 9.6 oz (57.425 kg) (Scale B.) IBW/kg (Calculated) : 45.5   Vital Signs: Temp: 97.2 F (36.2 C) (08/04 0539) Temp src: Oral (08/04 0539) BP: 111/83 mmHg (08/04 0539) Pulse Rate: 111  (08/04 0539)  Labs:  Angela Franco 07/07/12 0645 07/06/12 1813 07/06/12 0635 07/05/12 1905 07/05/12 0920 07/05/12 0914  HGB 8.9* -- 8.9* -- -- --  HCT 26.5* -- 25.7* -- 28.1* --  PLT 161 -- 155 -- 155 --  APTT -- -- -- -- -- --  LABPROT 25.3* -- 26.7* -- 20.7* --  INR 2.26* -- 2.42* -- 1.74* --  HEPARINUNFRC -- -- 0.55 0.25* -- 0.23*  CREATININE 1.18* -- 1.09 -- 1.01 --  CKTOTAL -- 26 -- -- -- --  CKMB -- 1.6 -- -- -- --  TROPONINI -- <0.30 -- -- -- --    Estimated Creatinine Clearance: 30.2 ml/min (by C-G formula based on Cr of 1.18).  Home Coumadin dose:  7.5 mg po daily except 5 mg on Tue and Fri  Assessment: Patient is a 78 y.o female with afib who had been receiving Septra at home for an infection that developed after skin CA excision. Patient presented with supratherapeutic INR of 5.26 7/27 likely d/t interaction between septra and coumadin.Septra now discontinued.   S/p cardiac cath 8/1  coumadin has been resumed.  INR is therapeutic at 2.26. H/H on 8/3 decreased, but no overt bleeding noted, platelets remain stable.   Goal of Therapy:  INR 2-3 Monitor platelets by anticoagulation protocol: Yes   Plan: 1. Coumadin 5mg  PO x 1 tonight 2. F/u AM INR  Talbert Cage, Pharm.D. Clinical Pharmacist Pager: [phone] 07/07/2012,11:19 AM

## 2012-07-07 NOTE — Progress Notes (Signed)
Subjective: Feels considerably better. Had flank pain that has resolved. Ventricular rate is much slower (70-80s at rest) consistent with better CHF compensation. Weight has been steady for last several days, in and outs fairly balanced last 24 h, Na level still moderately low but also stable, as is the BUN level. CT shows persistent evidence of hypervolemia consistent with right heart failure (pleural and pericardial effusion, abdominal wall edema), but no pulmonary congestion.  Objective: Vital signs in last 24 hours: Temp:  [97.2 F (36.2 C)-98 F (36.7 C)] 97.2 F (36.2 C) (08/04 0539) Pulse Rate:  [100-111] 111  (08/04 0539) Resp:  [18] 18  (08/04 0539) BP: (111-133)/(73-86) 111/83 mmHg (08/04 0539) SpO2:  [95 %-96 %] 95 % (08/04 0539) Weight:  [57.425 kg (126 lb 9.6 oz)] 57.425 kg (126 lb 9.6 oz) (08/04 0539) Weight change: 0.091 kg (3.2 oz) Last BM Date: 07/06/12 Intake/Output from previous day: +760  Wt stable at 57.4 08/03 0701 - 08/04 0700 In: 600 [P.O.:600] Out: 400 [Urine:400] Intake/Output this shift:       Lab Results:  Basename 07/07/12 0645 07/06/12 0635  WBC 5.6 5.9  HGB 8.9* 8.9*  HCT 26.5* 25.7*  PLT 161 155   BMET  Basename 07/07/12 0820 07/07/12 0645 07/06/12 0635  NA 124* 123* --  K -- 4.4 4.1  CL -- 88* 89*  CO2 -- 24 25  GLUCOSE -- 85 97  BUN -- 23 22  CREATININE -- 1.18* 1.09  CALCIUM -- 8.8 8.8    Basename 07/06/12 1813  TROPONINI <0.30      EKG: Orders placed during the hospital encounter of 06/29/12  . EKG 12-LEAD  . EKG 12-LEAD  . EKG  . EKG 12-LEAD  . EKG 12-LEAD  . EKG 12-LEAD  . EKG 12-LEAD  . EKG 12-LEAD  . EKG 12-LEAD  . EKG 12-LEAD  . EKG 12-LEAD  . EKG 12-LEAD    Studies/Results: Dg Abd 1 View  07/06/2012  *RADIOLOGY REPORT*  Clinical Data: Abdominal pain.  ABDOMEN - 1 VIEW  Comparison: No priors.  Findings: Gas and stool is scattered throughout the colon including a small amount of gas within the distal  rectum.  No pathologic distension of small bowel.  There is a relative paucity of bowel gas in the lower anatomic pelvis.  No gross evidence of pneumoperitoneum.  IMPRESSION: 1.  Nonspecific, nonobstructive bowel gas pattern, as above. 2.  No pneumoperitoneum. 3.  Relative paucity of bowel gas in the anatomic pelvis.  This is nonspecific.  This could relate to an over distended urinary bladder, a large fibroid uterus, or other mass lesion. If there is clinical concern for potential pelvic mass, this could be further evaluated with a CT of the abdomen and pelvis.  Original Report Authenticated By: Florencia Reasons, M.D.   Ct Abdomen Pelvis W Contrast  07/06/2012  *RADIOLOGY REPORT*  Clinical Data: 78 year old female with left abdominal and pelvic pain.  CT ABDOMEN AND PELVIS WITH CONTRAST  Technique:  Multidetector CT imaging of the abdomen and pelvis was performed following the standard protocol during bolus administration of intravenous contrast.  Contrast:  80 ml intravenous Omnipaque-300  Comparison: None  Findings: Cardiomegaly and small pericardial effusion noted. A small right and small to moderate left pleural effusion are noted with bilateral lower lung atelectasis.  The liver is mildly heterogeneous without discrete focal lesions. The spleen, pancreas, gallbladder and adrenal glands are unremarkable. Bilateral renal cortical thinning is noted with left renal atrophy.  There is no evidence of free fluid, biliary dilatation or abdominal aortic aneurysm. The visualized mesenteric arteries are patent. Upper limits of normal sized retroperitoneal lymph nodes are noted - likely reactive.  The bowel is within normal limits. The bladder is distended.  Diffuse subcutaneous edema is noted. No acute or suspicious bony abnormalities are identified. Moderate degenerative changes within the lumbar spine noted.  IMPRESSION: Cardiomegaly with small pericardial effusion, bilateral pleural effusions and diffuse  subcutaneous edema.  Associated bilateral lower lung atelectasis.  Distended bladder.  Original Report Authenticated By: Rosendo Gros, M.D.    Medications: I have reviewed the patient's current medications.    [name] aspirin EC  81 mg Oral Daily  . atorvastatin  20 mg Oral q1800  . carbamazepine  100 mg Oral BID  . carvedilol  6.25 mg Oral BID WC  . docusate sodium  100 mg Oral BID  . furosemide  80 mg Oral Daily  . gi cocktail  30 mL Oral Once  . iohexol  20 mL Oral Q1 Hr x 2  . loratadine  10 mg Oral Daily  . losartan  50 mg Oral Daily  . mupirocin cream   Topical TID  . polyethylene glycol  17 g Oral Daily  . potassium chloride  20 mEq Oral BID  . tiotropium  18 mcg Inhalation Daily  . tolvaptan  15 mg Oral Q24H  . warfarin  3 mg Oral ONCE-1800  . Warfarin - Pharmacist Dosing Inpatient   Does not apply q1800  . DISCONTD: furosemide  40 mg Oral Daily   Assessment/Plan: Principal Problem:  *Systolic and diastolic CHF, acute on chronic, (BNP 3800) Active Problems:  Hypo-osmolality and hyponatremia  Elevated troponin, NSTEMI vs secondary elevation from CHF  Normocytic anemia  Atrial fibrillation, chronic  Trigeminal neuralgia  Moderate to severe pulmonary hypertension, PA pressure in 70s June 2013  Elevated INR  Non-ischemic cardiomyopathy  LBBB (left bundle branch block)  Chronic anticoagulation  HTN (hypertension)  Nausea  Abdominal pain  PLAN: Records form [facility] ordered/faxed.    HR overall less than 100. To go to [facility] ? Tomorrow.      LOS: 8 days   INGOLD,LAURA R 07/07/2012, 9:58 AM  I have seen and examined the patient along with Nada Boozer NP.  I have reviewed the chart, notes and new data.  I agree with NP's note.  Key new complaints: wants to leave hospital Key examination changes: JVP about 4 cm above clavicle Key new findings / data: stable Na, BUN, weight  PLAN: Would DC to [facility] tomorrow if primary service  agrees, we can do outpatient cardiac cine MRI to evaluate for amyloidosis.  Keep on current dose of Samsca, carvedilol, losartan, furosemide and potassium.  Na restricted diet. Warfarin per INR (can be drawn at [facility] and faxed to our office for adjustment). It will be very important to insist on daily weights and to intervene promptly with enhanced diuretic therapy if weight increases >3lb overnight or 5lb/week. Will review opportunity for cardioversion after receiving records fron [place], [place].  Thurmon Fair, MD, [facility] and Vascular Center [phone] 07/07/2012, 11:15 AM

## 2012-07-07 NOTE — Progress Notes (Signed)
[facility] PROGRESS NOTE  Angela Franco JXB:[id number] DOB: 1929-10-25 DOA: 06/29/2012 PCP: No primary provider on file. None in [place]. Former PCP [name], MD [place], [place]  Assessment/Plan: 1. Hyponatremia: Per conversation with nephrology started demeclocycline on 7/30- 8/1.  Asymptomatic. Most likely secondary to Tegretol, possibly complicated by recent addition of Trazodone. Trazodone discontinued on 7/30. Continue Fluid restriction. Patient has severe pain when taken off Tegretol and therefore refuses discontinued. Patient was started on Samsca  Cardiology on 8/1.   2. Acute systolic heart failure: Continues to improve. Complicated by severe pulmonary hypertension. Mildly symptomatic. Continue diuresis as per cardiology  Recommendation. 3. Severe pulmonary hypertension: Etiology unclear. No history to suggest VTE (INR >5 on admission).  4. Elevated troponin/chest discomfort: Appreciate cardiology evaluation and recommendations. Continue ASA, Coreg.  Cath yesterday results reviewed. 5. Normocytic anemia: Stable. 6. Atrial fibrillation: Continue carvedilol, losartan. Warfarin, INR therapeutic.  Cardiology would like to review patient's old records prior to deciding on cardioversion.   7. Warfarin-induced coagulopathy: INR high on admission, without bleeding. Probably high secondary to recent start of Bactrim as an outpatient.  Resolve. 8. History of cellulitis: Post procedure infection of the nose.  Appears resolved. Discontinue antibiotics on 7/30. 9. Trigeminal neuralgia: Continue carbamazepine--patient refuses to discontinue and other agents have been ineffective-- resulting in excruciating pain. 10. Nausea/abdominal pain-x-ray of the abdomen , question of pelvic mass. CT scan of the abdomen showed distended bladder.I&O cath produced 900 cc of urine patient has some urinary retention UA negative. Will monitor 11. Urinary retention: As mentioned above  Code Status:  DNR Family Communication: [name] (Daughter) [phone] Disposition Plan: Plan for discharge to assisted living facility on 8/5  Brief narrative: 78 year old woman with history of hyponatremia (on Carbamazepine) last hospitalized January of this year for the same (responded to fluid restriction) presented today with 1-2 week history of gradual decline, lethargy, some confusion and increasing lower extremity edema. She reports she is chronic hyponatremia but cannot recall her baseline. She refuses to discontinue carbamazepine because of intractable pain without it. She has been told that she is "right heart failure" was not been on diuretics because of her history of hyponatremia. She denies any previous cardiac history other than atrial fibrillation.   Record Review (obtained by fax from PCP's office):  05/15/12 Hemoglobin 10.9, sodium 126, chloride 88  05/15/12 2-d echocardiogram: LVEF 40-45%, RV pressure 73, severe pulmonary hypertension, moderate tricuspid regurgitation, right atrium moderate-severe dilatation, moderate anterior/anteroseptal hypokinesis.  EKG (undated): SR, LBBB, anterior infarct, old. Overall appearance similar to today's EKG.  Consultants:  Cardiology  Physical therapy--no follow-up  Procedures:  R/L heart cath 8/1  HPI/Subjective: Abdominal pain has resolved. No complaint  Objective: Filed Vitals:   07/03/12 0600 07/03/12 0921 07/03/12 0950 07/03/12 1334  BP: 115/68  129/72 131/82  Pulse: 62   109  Temp: 97.1 F (36.2 C)   99.3 F (37.4 C)  TempSrc: Oral   Oral  Resp: 18   19  Height:      Weight:      SpO2: 100% 96%  97%    Intake/Output Summary (Last 24 hours) at 07/03/12 1355 Last data filed at 07/03/12 1300  Gross per 24 hour  Intake  497.5 ml  Output   2275 ml  Net -1777.5 ml   Wt Readings from Last 3 Encounters:  07/03/12 57.38 kg (126 lb 8 oz)  06/28/12 60.51 kg (133 lb 6.4 oz)    Exam:   General:   Appears calm  and comfortable  Cardiovascular: irreg, no m/r/g. LE edema (1+).  Respiratory: Anterior, rales normal respiratory effort, no w/r/r.  Abdomen: Soft nontender nondistended positive bowel sounds.  Extremities: Trace edema bilaterally  Data Reviewed: Basic Metabolic Panel:  Lab 07/02/12 7564 07/01/12 0641 06/30/12 0131 06/29/12 1205  NA 121* 119* 121* 120*  K 3.7 3.7 3.5 3.8  CL 84* 83* 83* 82*  CO2 27 25 25 25   GLUCOSE 91 83 110* 97  BUN 26* 20 15 16   CREATININE 1.39* 1.35* 1.14* 1.02  CALCIUM 8.7 8.6 8.8 9.3  MG -- -- -- --  PHOS -- -- -- --   CBC:  Lab 07/03/12 0640 06/29/12 1205  WBC 7.9 7.2  NEUTROABS -- 5.8  HGB 10.5* 10.7*  HCT 29.7* 30.6*  MCV 85.6 85.0  PLT 167 141*   Cardiac Enzymes:  Lab 07/01/12 0640 06/30/12 0900 06/30/12 0132 06/29/12 1749 06/29/12 1423  CKTOTAL -- 22 20 21  --  CKMB -- 1.3 1.2 1.3 --  CKMBINDEX -- -- -- -- --  TROPONINI <0.30 0.32* <0.30 0.63* 0.59*   BNP (last 3 results)  Basename 06/29/12 1423  PROBNP 3820.0*   Studies:  Scheduled Meds:    . amLODipine  5 mg Oral Daily  . aspirin EC  81 mg Oral Daily  . atorvastatin  20 mg Oral q1800  . carbamazepine  100 mg Oral BID  . carvedilol  3.125 mg Oral BID WC  . demeclocycline  300 mg Oral Q12H  . docusate sodium  100 mg Oral BID  . furosemide  40 mg Oral Daily  . loratadine  10 mg Oral Daily  . losartan  50 mg Oral Daily  . mupirocin cream   Topical TID  . polyethylene glycol  17 g Oral Daily  . potassium chloride  20 mEq Oral BID  . tiotropium  18 mcg Inhalation Daily   Continuous Infusions:    . heparin 800 Units/hr (07/03/12 1131)  . DISCONTD: heparin 800 Units/hr (07/02/12 1053)      Molli Posey, MD  [facility] Pager [phone]. If 7PM-7AM, please contact night-coverage at [email or web address], password [facility] 07/03/2012, 1:55 PM  LOS: 4 days   Time spent: 25 minutes

## 2012-07-07 NOTE — Progress Notes (Signed)
Bladder scan Complete >643ml will notify MD

## 2012-07-08 DIAGNOSIS — R338 Other retention of urine: Secondary | ICD-10-CM

## 2012-07-08 LAB — BASIC METABOLIC PANEL
BUN: 20 mg/dL (ref 6–23)
Chloride: 86 mEq/L — ABNORMAL LOW (ref 96–112)
Creatinine, Ser: 1.19 mg/dL — ABNORMAL HIGH (ref 0.50–1.10)
GFR calc Af Amer: 49 mL/min — ABNORMAL LOW (ref >90)
Glucose, Bld: 92 mg/dL (ref 70–99)

## 2012-07-08 LAB — CBC
HCT: 27.5 % — ABNORMAL LOW (ref 36.0–46.0)
MCH: 29.9 pg (ref 26.0–34.0)
MCHC: 34.5 g/dL (ref 30.0–36.0)
MCV: 86.5 fL (ref 78.0–100.0)
RDW: 14.2 % (ref 11.5–15.5)

## 2012-07-08 LAB — PROTIME-INR: Prothrombin Time: 23.8 seconds — ABNORMAL HIGH (ref 11.6–15.2)

## 2012-07-08 MED ORDER — TOLVAPTAN 15 MG PO TABS: 15.0000 mg | ORAL_TABLET | ORAL | Status: DC

## 2012-07-08 MED ORDER — FUROSEMIDE 80 MG PO TABS: 80.0000 mg | ORAL_TABLET | Freq: Every day | ORAL | Status: DC

## 2012-07-08 MED ORDER — WARFARIN SODIUM 7.5 MG PO TABS
7.5000 mg | ORAL_TABLET | Freq: Once | ORAL | Status: DC
  Filled 2012-07-08: qty 1

## 2012-07-08 MED ORDER — CARVEDILOL 6.25 MG PO TABS: 6.2500 mg | ORAL_TABLET | Freq: Two times a day (BID) | ORAL | Status: DC

## 2012-07-08 MED ORDER — LOSARTAN POTASSIUM 50 MG PO TABS: 50.0000 mg | ORAL_TABLET | Freq: Every day | ORAL | Status: DC

## 2012-07-08 MED ORDER — ASPIRIN 81 MG PO TBEC: 81.0000 mg | DELAYED_RELEASE_TABLET | Freq: Every day | ORAL | Status: AC

## 2012-07-08 MED ORDER — POTASSIUM CHLORIDE CRYS ER 20 MEQ PO TBCR: 20.0000 meq | EXTENDED_RELEASE_TABLET | Freq: Two times a day (BID) | ORAL | Status: DC

## 2012-07-08 NOTE — Progress Notes (Signed)
Physician Discharge Summary  Angela Franco ZOX:[id number] DOB: 1929/07/28 DOA: 06/29/2012  PCP: No primary provider on file.  Admit date: 06/29/2012 Discharge date: 07/08/2012  Recommendations for Outpatient Follow-up:     Follow-up Information    Follow up with HILTY,Kenneth C, MD. (You will be scheduled for an outpatient MRI of the heart and a follow up with Dr. Rennis Golden, from [facility])    Contact information:   [address] [place] [address] [phone]       Follow up with [facility] . (they will call you to schedule appointment)    Contact information:   [address] [place] [address] [place] [address] [phone]      Follow up with Primary Doctor. Schedule an appointment as soon as possible for a visit in 1 week. (BMP in 1 week)          Discharge Diagnoses:  Principal Problem:  *Systolic and diastolic CHF, acute on chronic, (BNP 3800) Active Problems:  Hypo-osmolality and hyponatremia  Elevated troponin, NSTEMI vs secondary elevation from CHF  Normocytic anemia  Atrial fibrillation, chronic  Trigeminal neuralgia  Moderate to severe pulmonary hypertension, PA pressure in 70s June 2013  Elevated INR  Non-ischemic cardiomyopathy  LBBB (left bundle branch block)  Chronic anticoagulation  HTN (hypertension)  Nausea  Abdominal pain  Acute urinary retention   Discharge Condition: Stable  Disposition: To assisted living facility Diet recommendation: Heart healthy diet  Wt Readings from Last 3 Encounters:  07/08/12 53.751 kg (118 lb 8 oz)  07/08/12 53.751 kg (118 lb 8 oz)  06/28/12 60.51 kg (133 lb 6.4 oz)    History of present illness:  78 year old woman with history of hyponatremia (on Carbamazepine) last hospitalized January of this year for the same (responded to fluid restriction) presented today with 1-2 week history of gradual decline, lethargy, some confusion and increasing lower  extremity edema. She reports she is chronic hyponatremia but cannot recall her baseline. She refuses to discontinue carbamazepine because of intractable pain without it.  She has been told that she is "right heart failure" was not been on diuretics because of her history of hyponatremia. She denies any previous cardiac history other than atrial fibrillation. Approximately one week ago she had skin cancer removed from her nose. She was seen in urgent care later started on antibiotics (Septra) for post procedure infection.  In the emergency department she was noted to be afebrile with stable vital signs. Sodium was 120, chloride 82, hemoglobin 10.7, troponin 0.59, BNP 3820. Chest x-ray was negative. EKG independently reviewed showed atrial fibrillation with LBBB (old), anterior infarct (old).  Patient noted back pain upon awakening this morning which has been present all day and improves with movement.   Hospital Course:  1. Hypervolemic Hyponatremia: Per conversation with nephrology patient was initially started demeclocycline on 7/30- 8/1. Asymptomatic. It was thought that hyponatremia was most likely secondary to Tegretol, possibly complicated by recent addition of Trazodone. Trazodone discontinued on 7/30. Patient was placed on fluid restriction. Patient has severe pain when taken off Tegretol and therefore refuses discontinued. Cardiology thought the patient probably most likely had hyponatremia secondary to hypervolemia from heart failure. Patient was started on  Samsca by Cardiology on 8/1.   Sodium has improved and remained stable.  2. Acute systolic heart failure:  This was complicated by severe pulmonary hypertension. Mildly symptomatic. Patient was diuresed by cardiology and continue to be diuresed. She's on Cozaar Lasix and Corag. She  will be continued of congestive heart failure treatment. Patient will followup with cardiology outpatient. Patient to get a BMP in one week. 3. Pulmonary  hypertension: Etiology unclear. No history to suggest VTE (INR >5 on admission).  4. Elevated troponin/chest discomfort: Elevated troponin most likely rate related to congestive heart failure. Cardiac catheterization showed normal coronaries Patient had cardiac catheterization done during this hospitalization.  Continue ASA, Coreg. Patient per cardiology to have cardiac MRI done outpatient to rule out sarcoidosis causing restrictive cardiomyopathy 5. Normocytic anemia: Stable. 6. Atrial fibrillation: Continue carvedilol, losartan. Warfarin, INR therapeutic. Cardiology would like to review patient's old records prior to deciding on cardioversion. This will be decided outpatient after her records are reviewed. 7. Warfarin-induced coagulopathy: INR high on admission, without bleeding. Probably high secondary to recent start of Bactrim as an outpatient. Resolve. 8. History of cellulitis: Post procedure infection of the nose. Appears resolved. Discontinued antibiotics on 7/30. 9. Trigeminal neuralgia: Continue carbamazepine--patient refuses to discontinue secondary to other   agents  being ineffective-- resulting in excruciating pain. 10. Nausea/abdominal pain-x-ray of the abdomen , question of pelvic mass. CT scan of the abdomen showed distended bladder.I&O cath produced 900 cc of urine patient has some urinary retention UA negative.  patient still had difficulty voiding and continued to have urinary retention 11. Urinary retention: Foley catheter was placed secondary to continued urinary retention. Claritin discontinued. Discussed with [facility]. Patient to followup in one and 2 weeks for a voiding trial. The patient was discharged with the Foley leg bag.   Urology Will call patient with appointment.  Discussed with her daughter.  Discharge Instructions  Discharge Orders    Future Orders Please Complete By Expires   Diet - low sodium heart healthy        Medication List  As of 07/08/2012 12:40 PM     STOP taking these medications         amLODipine 5 MG tablet      fexofenadine 180 MG tablet      mupirocin cream 2 %      sulfamethoxazole-trimethoprim 800-160 MG per tablet      traZODone 50 MG tablet         TAKE these medications         aspirin 81 MG EC tablet   Take 1 tablet (81 mg total) by mouth daily.      calcium carbonate 600 MG Tabs   Commonly known as: OS-CAL   Take 600 mg by mouth 2 (two) times daily with a meal.      carbamazepine 100 MG 12 hr capsule   Commonly known as: CARBATROL   Take 100 mg by mouth 2 (two) times daily.      carvedilol 6.25 MG tablet   Commonly known as: COREG   Take 1 tablet (6.25 mg total) by mouth 2 (two) times daily with a meal.      furosemide 80 MG tablet   Commonly known as: LASIX   Take 1 tablet (80 mg total) by mouth daily.      levalbuterol 45 MCG/ACT inhaler   Commonly known as: XOPENEX HFA   Inhale 1-2 puffs into the lungs every 4 (four) hours as needed for wheezing.      losartan 50 MG tablet   Commonly known as: COZAAR   Take 1 tablet (50 mg total) by mouth daily.      potassium chloride SA 20 MEQ tablet   Commonly known as: K-DUR,KLOR-CON   Take 1  tablet (20 mEq total) by mouth 2 (two) times daily.      rosuvastatin 10 MG tablet   Commonly known as: CRESTOR   Take 10 mg by mouth daily.      tiotropium 18 MCG inhalation capsule   Commonly known as: SPIRIVA   Place 18 mcg into inhaler and inhale daily.      tolvaptan 15 MG Tabs   Commonly known as: SAMSCA   Take 1 tablet (15 mg total) by mouth daily.      warfarin 5 MG tablet   Commonly known as: COUMADIN   Take 5-7.5 mg by mouth daily. 1 tablet on Tuesday and Friday  1.5 tablet all other days           Follow-up Information    Follow up with HILTY,Kenneth C, MD. (You will be scheduled for an outpatient MRI of the heart and a follow up with Dr. Rennis Golden, from [facility])    Contact information:   [address] [place] [address] [phone]       Follow up with [facility] . (they will call you to schedule appointment)    Contact information:   [address] [place] [address] [place] [address] [phone]      Follow up with Primary Doctor. Schedule an appointment as soon as possible for a visit in 1 week. (BMP in 1 week)           The results of significant diagnostics from this hospitalization (including imaging, microbiology, ancillary and laboratory) are listed below for reference.    Significant Diagnostic Studies: Dg Chest 2 View  06/29/2012  *RADIOLOGY REPORT*  Clinical Data: Fever, leg swelling  CHEST - 2 VIEW  Comparison: None.  Findings: Cardiomediastinal silhouette is unremarkable.  No acute infiltrate or pulmonary edema.  Mild atelectasis right base. Nodular density right base may represent nipple shadow.  Repeat frontal view with nipple markers is recommended for confirmation. Osteopenia and mild degenerative changes thoracic spine.  IMPRESSION: No acute infiltrate or pulmonary edema. Mild atelectasis right base.  Nodular density right base may represent nipple shadow. Repeat frontal view with nipple markers is recommended for confirmation.  Original Report Authenticated By: Natasha Mead, M.D.   Dg Abd 1 View  07/06/2012  *RADIOLOGY REPORT*  Clinical Data: Abdominal pain.  ABDOMEN - 1 VIEW  Comparison: No priors.  Findings: Gas and stool is scattered throughout the colon including a small amount of gas within the distal rectum.  No pathologic distension of small bowel.  There is a relative paucity of bowel gas in the lower anatomic pelvis.  No gross evidence of pneumoperitoneum.  IMPRESSION: 1.  Nonspecific, nonobstructive bowel gas pattern, as above. 2.  No pneumoperitoneum. 3.  Relative paucity of bowel gas in the anatomic pelvis.  This is nonspecific.  This could relate to an over distended urinary bladder, a large fibroid uterus, or other mass lesion.  If there is clinical concern for potential pelvic mass, this could be further evaluated with a CT of the abdomen and pelvis.  Original Report Authenticated By: Florencia Reasons, M.D.   Ct Abdomen Pelvis W Contrast  07/06/2012  *RADIOLOGY REPORT*  Clinical Data: 78 year old female with left abdominal and pelvic pain.  CT ABDOMEN AND PELVIS WITH CONTRAST  Technique:  Multidetector CT imaging of the abdomen and pelvis was performed following the standard protocol during bolus administration of intravenous contrast.  Contrast:  80 ml intravenous Omnipaque-300  Comparison: None  Findings: Cardiomegaly and small pericardial effusion noted. A small right and small to moderate left pleural effusion are noted with bilateral lower lung atelectasis.  The liver is mildly heterogeneous without discrete focal lesions. The spleen, pancreas, gallbladder and adrenal glands are unremarkable. Bilateral renal cortical thinning is noted with left renal atrophy.  There is no evidence of free fluid, biliary dilatation or abdominal aortic aneurysm. The visualized mesenteric arteries are patent. Upper limits of normal sized retroperitoneal lymph nodes are noted - likely reactive.  The bowel is within normal limits. The bladder is distended.  Diffuse subcutaneous edema is noted. No acute or suspicious bony abnormalities are identified. Moderate degenerative changes within the lumbar spine noted.  IMPRESSION: Cardiomegaly with small pericardial effusion, bilateral pleural effusions and diffuse subcutaneous edema.  Associated bilateral lower lung atelectasis.  Distended bladder.  Original Report Authenticated By: Rosendo Gros, M.D.       Labs: Basic Metabolic Panel:  Lab 07/08/12 4098 07/08/12 07/07/12 1528 07/07/12 0820 07/07/12 1191 07/06/12 4782 07/05/12 0920 07/04/12 0527  NA 127* 128* 125* 124* 123* -- -- --  K 4.1 -- -- -- 4.4 4.1 4.8 4.4  CL 86* -- -- -- 88* 89* 92* 85*  CO2 28 -- -- -- 24 25 25 26   GLUCOSE 92 -- -- --  85 97 141* 94  BUN 20 -- -- -- 23 22 23  24*  CREATININE 1.19* -- -- -- 1.18* 1.09 1.01 1.04  CALCIUM 9.3 -- -- -- 8.8 8.8 9.2 9.0  MG -- -- -- -- -- -- -- --  PHOS -- -- -- -- -- -- -- --    Lab 07/08/12 0630 07/07/12 0645 07/06/12 0635 07/05/12 0920 07/04/12 0527  WBC 5.5 5.6 5.9 5.5 7.3  NEUTROABS -- -- -- -- --  HGB 9.5* 8.9* 8.9* 9.8* 9.8*  HCT 27.5* 26.5* 25.7* 28.1* 27.7*  MCV 86.5 86.3 86.0 86.2 85.5  PLT 216 161 155 155 168   Cardiac Enzymes:  Lab 07/06/12 1813  CKTOTAL 26  CKMB 1.6  CKMBINDEX --  TROPONINI <0.30   BNP: BNP (last 3 results)  Basename 07/08/12 0630 06/29/12 1423  PROBNP 2596.0* 3820.0*    Time coordinating discharge:65 min Signed:  Molli Posey, MD [facility] 07/08/2012, 12:40 PM

## 2012-07-08 NOTE — Progress Notes (Signed)
DC IV, DC Tele, DC to assisted living. Discharge instructions and home medications discussed with patient and patient's family. Patient and family denied any questions or concerns at this time. Patient leaving unit via wheelchair and appears in no acute distress.

## 2012-07-08 NOTE — Discharge Summary (Signed)
Angela Franco ZOX:[id number] DOB: 06/22/1929 DOA: 06/29/2012  PCP: No primary provider on file.  Admit date: 06/29/2012  Discharge date: 07/08/2012  Recommendations for Outpatient Follow-up:  Follow-up Information    Follow up with HILTY,Kenneth C, MD. (You will be scheduled for an outpatient MRI of the heart and a follow up with Dr. Rennis Golden, from [facility] and Vascular)    Contact information:    [address]  [address]  [place] [address]  [phone]       Follow up with [facility] . (they will call you to schedule appointment)    Contact information:    [address]  [place] [address]  [phone]       Follow up with Primary Doctor. Schedule an appointment as soon as possible for a visit in 1 week. (BMP in 1 week)        Discharge Diagnoses:  Principal Problem:  *Systolic and diastolic CHF, acute on chronic, (BNP 3800)  Active Problems:  Hypo-osmolality and hyponatremia  Elevated troponin, NSTEMI vs secondary elevation from CHF  Normocytic anemia  Atrial fibrillation, chronic  Trigeminal neuralgia  Moderate to severe pulmonary hypertension, PA pressure in 70s June 2013  Elevated INR  Non-ischemic cardiomyopathy  LBBB (left bundle branch block)  Chronic anticoagulation  HTN (hypertension)  Nausea  Abdominal pain  Acute urinary retention  Discharge Condition: Stable  Disposition: To assisted living facility  Diet recommendation: Heart healthy diet  Wt Readings from Last 3 Encounters:   07/08/12  53.751 kg (118 lb 8 oz)   07/08/12  53.751 kg (118 lb 8 oz)   06/28/12  60.51 kg (133 lb 6.4 oz)   History of present illness:  78 year old woman with history of hyponatremia (on Carbamazepine) last hospitalized January of this year for the same (responded to fluid restriction) presented today with 1-2 week history of gradual decline, lethargy, some confusion and increasing lower extremity edema. She reports she is  chronic hyponatremia but cannot recall her baseline. She refuses to discontinue carbamazepine because of intractable pain without it.  She has been told that she is "right heart failure" was not been on diuretics because of her history of hyponatremia. She denies any previous cardiac history other than atrial fibrillation. Approximately one week ago she had skin cancer removed from her nose. She was seen in urgent care later started on antibiotics (Septra) for post procedure infection.  In the emergency department she was noted to be afebrile with stable vital signs. Sodium was 120, chloride 82, hemoglobin 10.7, troponin 0.59, BNP 3820. Chest x-ray was negative. EKG independently reviewed showed atrial fibrillation with LBBB (old), anterior infarct (old).  Patient noted back pain upon awakening this morning which has been present all day and improves with movement.   Hospital Course:  1. Hypervolemic Hyponatremia: Per conversation with nephrology patient was initially started demeclocycline on 7/30- 8/1. Asymptomatic. It was thought that hyponatremia was most likely secondary to Tegretol, possibly complicated by recent addition of Trazodone. Trazodone discontinued on 7/30. Patient was placed on fluid restriction. Patient has severe pain when taken off Tegretol and therefore refuses discontinued. Cardiology thought the patient probably most likely had hyponatremia secondary to hypervolemia from heart failure. Patient was started on Samsca by Cardiology on 8/1. Sodium has improved and remained stable.  2. Acute systolic heart failure: This was complicated by severe pulmonary hypertension. Mildly symptomatic. Patient was diuresed by cardiology and continue to be diuresed. She's on Cozaar Lasix and  Corag. She will be continued of congestive heart failure treatment. Patient will followup with cardiology outpatient. Patient to get a BMP in one week. 3. Pulmonary hypertension: Etiology unclear. No history to  suggest VTE (INR >5 on admission).  4. Elevated troponin/chest discomfort: Elevated troponin most likely rate related to congestive heart failure. Cardiac catheterization showed normal coronaries Patient had cardiac catheterization done during this hospitalization. Continue ASA, Coreg. Patient per cardiology to have cardiac MRI done outpatient to rule out sarcoidosis causing restrictive cardiomyopathy 5. Normocytic anemia: Stable. 6. Atrial fibrillation: Continue carvedilol, losartan. Warfarin, INR therapeutic. Cardiology would like to review patient's old records prior to deciding on cardioversion. This will be decided outpatient after her records are reviewed. 7. Warfarin-induced coagulopathy: INR high on admission, without bleeding. Probably high secondary to recent start of Bactrim as an outpatient. Resolve. 8. History of cellulitis: Post procedure infection of the nose. Appears resolved. Discontinued antibiotics on 7/30. 9. Trigeminal neuralgia: Continue carbamazepine--patient refuses to discontinue secondary to other agents being ineffective-- resulting in excruciating pain. 10. Nausea/abdominal pain-x-ray of the abdomen , question of pelvic mass. CT scan of the abdomen showed distended bladder.I&O cath produced 900 cc of urine patient has some urinary retention UA negative. patient still had difficulty voiding and continued to have urinary retention 11. Urinary retention: Foley catheter was placed secondary to continued urinary retention. Claritin discontinued. Discussed with [facility]. Patient to followup in one and 2 weeks for a voiding trial. The patient was discharged with the Foley leg bag. Urology Will call patient with appointment. Discussed with her daughter. Discharge Instructions  Discharge Orders    Future Orders  Please Complete By  Expires    Diet - low sodium heart healthy        Medication List  As of 07/08/2012 12:40 PM    STOP taking these medications          amLODipine  5 MG tablet      fexofenadine 180 MG tablet      mupirocin cream 2 %      sulfamethoxazole-trimethoprim 800-160 MG per tablet      traZODone 50 MG tablet       TAKE these medications          aspirin 81 MG EC tablet      Take 1 tablet (81 mg total) by mouth daily.      calcium carbonate 600 MG Tabs      Commonly known as: OS-CAL      Take 600 mg by mouth 2 (two) times daily with a meal.      carbamazepine 100 MG 12 hr capsule      Commonly known as: CARBATROL      Take 100 mg by mouth 2 (two) times daily.      carvedilol 6.25 MG tablet      Commonly known as: COREG      Take 1 tablet (6.25 mg total) by mouth 2 (two) times daily with a meal.      furosemide 80 MG tablet      Commonly known as: LASIX      Take 1 tablet (80 mg total) by mouth daily.      levalbuterol 45 MCG/ACT inhaler      Commonly known as: XOPENEX HFA      Inhale 1-2 puffs into the lungs every 4 (four) hours as needed for wheezing.      losartan 50 MG tablet      Commonly known as: COZAAR  Take 1 tablet (50 mg total) by mouth daily.      potassium chloride SA 20 MEQ tablet      Commonly known as: K-DUR,KLOR-CON      Take 1 tablet (20 mEq total) by mouth 2 (two) times daily.      rosuvastatin 10 MG tablet      Commonly known as: CRESTOR      Take 10 mg by mouth daily.      tiotropium 18 MCG inhalation capsule      Commonly known as: SPIRIVA      Place 18 mcg into inhaler and inhale daily.      tolvaptan 15 MG Tabs      Commonly known as: SAMSCA      Take 1 tablet (15 mg total) by mouth daily.      warfarin 5 MG tablet      Commonly known as: COUMADIN      Take 5-7.5 mg by mouth daily. 1 tablet on Tuesday and Friday  1.5 tablet all other days         Follow-up Information    Follow up with HILTY,Kenneth C, MD. (You will be scheduled for an outpatient MRI of the heart and a follow up with Dr. Rennis Golden, from [facility])    Contact information:    [address]  [address]    [place] [address]  [phone]       Follow up with [facility] . (they will call you to schedule appointment)    Contact information:    [address]  [place] [address]  [phone]       Follow up with Primary Doctor. Schedule an appointment as soon as possible for a visit in 1 week. (BMP in 1 week)         The results of significant diagnostics from this hospitalization (including imaging, microbiology, ancillary and laboratory) are listed below for reference.   Significant Diagnostic Studies:  Dg Chest 2 View  06/29/2012 *RADIOLOGY REPORT* Clinical Data: Fever, leg swelling CHEST - 2 VIEW Comparison: None. Findings: Cardiomediastinal silhouette is unremarkable. No acute infiltrate or pulmonary edema. Mild atelectasis right base. Nodular density right base may represent nipple shadow. Repeat frontal view with nipple markers is recommended for confirmation. Osteopenia and mild degenerative changes thoracic spine. IMPRESSION: No acute infiltrate or pulmonary edema. Mild atelectasis right base. Nodular density right base may represent nipple shadow. Repeat frontal view with nipple markers is recommended for confirmation. Original Report Authenticated By: Natasha Mead, M.D.  Dg Abd 1 View  07/06/2012 *RADIOLOGY REPORT* Clinical Data: Abdominal pain. ABDOMEN - 1 VIEW Comparison: No priors. Findings: Gas and stool is scattered throughout the colon including a small amount of gas within the distal rectum. No pathologic distension of small bowel. There is a relative paucity of bowel gas in the lower anatomic pelvis. No gross evidence of pneumoperitoneum. IMPRESSION: 1. Nonspecific, nonobstructive bowel gas pattern, as above. 2. No pneumoperitoneum. 3. Relative paucity of bowel gas in the anatomic pelvis. This is nonspecific. This could relate to an over distended urinary bladder, a large fibroid uterus, or other mass lesion. If there is clinical concern for  potential pelvic mass, this could be further evaluated with a CT of the abdomen and pelvis. Original Report Authenticated By: Florencia Reasons, M.D.  Ct Abdomen Pelvis W Contrast  07/06/2012 *RADIOLOGY REPORT* Clinical Data: 78 year old female with left abdominal and pelvic pain. CT ABDOMEN AND  PELVIS WITH CONTRAST Technique: Multidetector CT imaging of the abdomen and pelvis was performed following the standard protocol during bolus administration of intravenous contrast. Contrast: 80 ml intravenous Omnipaque-300 Comparison: None Findings: Cardiomegaly and small pericardial effusion noted. A small right and small to moderate left pleural effusion are noted with bilateral lower lung atelectasis. The liver is mildly heterogeneous without discrete focal lesions. The spleen, pancreas, gallbladder and adrenal glands are unremarkable. Bilateral renal cortical thinning is noted with left renal atrophy. There is no evidence of free fluid, biliary dilatation or abdominal aortic aneurysm. The visualized mesenteric arteries are patent. Upper limits of normal sized retroperitoneal lymph nodes are noted - likely reactive. The bowel is within normal limits. The bladder is distended. Diffuse subcutaneous edema is noted. No acute or suspicious bony abnormalities are identified. Moderate degenerative changes within the lumbar spine noted. IMPRESSION: Cardiomegaly with small pericardial effusion, bilateral pleural effusions and diffuse subcutaneous edema. Associated bilateral lower lung atelectasis. Distended bladder. Original Report Authenticated By: Rosendo Gros, M.D.  Labs:  Basic Metabolic Panel:  Lab  07/08/12 0630  07/08/12  07/07/12 1528  07/07/12 0820  07/07/12 1610  07/06/12 9604  07/05/12 0920  07/04/12 0527   NA  127*  128*  125*  124*  123*  --  --  --   K  4.1  --  --  --  4.4  4.1  4.8  4.4   CL  86*  --  --  --  88*  89*  92*  85*   CO2  28  --  --  --  24  25  25  26    GLUCOSE  92  --  --  --  85  97  141*   94   BUN  20  --  --  --  23  22  23   24*   CREATININE  1.19*  --  --  --  1.18*  1.09  1.01  1.04   CALCIUM  9.3  --  --  --  8.8  8.8  9.2  9.0   MG  --  --  --  --  --  --  --  --   PHOS  --  --  --  --  --  --  --  --    Lab  07/08/12 0630  07/07/12 0645  07/06/12 0635  07/05/12 0920  07/04/12 0527   WBC  5.5  5.6  5.9  5.5  7.3   NEUTROABS  --  --  --  --  --   HGB  9.5*  8.9*  8.9*  9.8*  9.8*   HCT  27.5*  26.5*  25.7*  28.1*  27.7*   MCV  86.5  86.3  86.0  86.2  85.5   PLT  216  161  155  155  168   Cardiac Enzymes:  Lab  07/06/12 1813   CKTOTAL  26   CKMB  1.6   CKMBINDEX  --   TROPONINI  <0.30   BNP:  BNP (last 3 results)  Basename  07/08/12 0630  06/29/12 1423   PROBNP  2596.0*  3820.0*   Time coordinating discharge:65 min  Signed:  Molli Posey, MD  [facility]  07/08/2012, 12:40 PM

## 2012-07-08 NOTE — Progress Notes (Signed)
[facility]  Subjective: Feels good.  Objective: Vital signs in last 24 hours: Temp:  [97.8 F (36.6 C)-98.2 F (36.8 C)] 98.2 F (36.8 C) (08/05 0431) Pulse Rate:  [88-104] 102  (08/05 0431) Resp:  [16-19] 16  (08/05 0431) BP: (94-129)/(57-90) 115/73 mmHg (08/05 0431) SpO2:  [94 %-100 %] 94 % (08/05 0431) Weight:  [53.751 kg (118 lb 8 oz)] 53.751 kg (118 lb 8 oz) (08/05 0431) Last BM Date: 07/06/12  Intake/Output from previous day: 08/04 0701 - 08/05 0700 In: 940 [P.O.:240] Out: 3025 [Urine:3025] Intake/Output this shift: Total I/O In: 220 [P.O.:220] Out: -   Medications Current Facility-Administered Medications  Medication Dose Route Frequency Provider Last Rate Last Dose  . acetaminophen (TYLENOL) tablet 650 mg  650 mg Oral Q4H PRN Chrystie Nose, MD      . aspirin EC tablet 81 mg  81 mg Oral Daily Wilburt Finlay, PA   81 mg at 07/07/12 1111  . atorvastatin (LIPITOR) tablet 20 mg  20 mg Oral q1800 Standley Brooking, MD   20 mg at 07/07/12 1734  . bisacodyl (DULCOLAX) suppository 10 mg  10 mg Rectal Daily PRN Alinda Money, MD   10 mg at 07/03/12 1657  . carbamazepine (TEGRETOL XR) 12 hr tablet 100 mg  100 mg Oral BID Standley Brooking, MD   100 mg at 07/07/12 2222  . carvedilol (COREG) tablet 6.25 mg  6.25 mg Oral BID WC Chrystie Nose, MD   6.25 mg at 07/08/12 0647  . docusate sodium (COLACE) capsule 100 mg  100 mg Oral BID Standley Brooking, MD   100 mg at 07/07/12 2222  . furosemide (LASIX) tablet 80 mg  80 mg Oral Daily Renella Steig, MD   80 mg at 07/07/12 1110  . gi cocktail (Maalox,Lidocaine,Donnatal)  30 mL Oral Once Alinda Money, MD      . guaiFENesin-dextromethorphan [facility] DM) 100-10 MG/5ML syrup 5 mL  5 mL Oral Q4H PRN Rolan Lipa, NP   5 mL at 07/02/12 2111  . HYDROmorphone (DILAUDID) injection 0.5 mg  0.5 mg Intravenous Q4H PRN Alinda Money, MD   0.5 mg at 07/06/12 1720  . loratadine (CLARITIN) tablet 10  mg  10 mg Oral Daily Standley Brooking, MD   10 mg at 07/07/12 1111  . LORazepam (ATIVAN) tablet 0.5 mg  0.5 mg Oral QHS PRN Jinger Neighbors, NP   0.5 mg at 07/07/12 0014  . LORazepam (ATIVAN) tablet 0.5 mg  0.5 mg Oral Once Alinda Money, MD   0.5 mg at 07/07/12 1820  . losartan (COZAAR) tablet 50 mg  50 mg Oral Daily Nada Boozer, NP   50 mg at 07/07/12 1110  . mupirocin cream (BACTROBAN) 2 %   Topical TID Standley Brooking, MD      . ondansetron [facility]) injection 4 mg  4 mg Intravenous Q6H PRN Chrystie Nose, MD   4 mg at 07/06/12 1951  . polyethylene glycol (MIRALAX / GLYCOLAX) packet 17 g  17 g Oral Daily Standley Brooking, MD   17 g at 07/07/12 1111  . potassium chloride SA (K-DUR,KLOR-CON) CR tablet 20 mEq  20 mEq Oral BID Standley Brooking, MD   20 mEq at 07/07/12 2222  . senna (SENOKOT) tablet 8.6 mg  1 tablet Oral Daily PRN Standley Brooking, MD   8.6 mg at 07/03/12 0644  . tiotropium (SPIRIVA) inhalation capsule 18 mcg  18 mcg Inhalation Daily Standley Brooking, MD   18 mcg at 07/07/12 0955  . tolvaptan (SAMSCA) tablet 15 mg  15 mg Oral Q24H Chrystie Nose, MD   15 mg at 07/07/12 1733  . traMADol (ULTRAM) tablet 50 mg  50 mg Oral Q6H PRN Abelino Derrick, PA   50 mg at 07/03/12 0052  . warfarin (COUMADIN) tablet 7.5 mg  7.5 mg Oral ONCE-1800 Drake Leach Rumbarger, PHARMD      . Warfarin - Pharmacist Dosing Inpatient   Does not apply [id number] Alinda Money, MD        PE: General appearance: alert, cooperative and no distress Lungs: clear to auscultation bilaterally Heart: irregularly irregular rhythm Extremities: No LEE Pulses: 2+ and symmetric Skin: warm and dry. Neurologic: Grossly normal  Lab Results:   Basename 07/08/12 0630 07/07/12 0645 07/06/12 0635  WBC 5.5 5.6 5.9  HGB 9.5* 8.9* 8.9*  HCT 27.5* 26.5* 25.7*  PLT 216 161 155   BMET  Basename 07/08/12 0630 07/08/12 07/07/12 1528 07/07/12 0645 07/06/12 0635  NA 127* 128* 125* -- --  K 4.1 -- -- 4.4 4.1  CL 86* -- --  88* 89*  CO2 28 -- -- 24 25  GLUCOSE 92 -- -- 85 97  BUN 20 -- -- 23 22  CREATININE 1.19* -- -- 1.18* 1.09  CALCIUM 9.3 -- -- 8.8 8.8   PT/INR  Basename 07/08/12 0630 07/07/12 0645 07/06/12 0635  LABPROT 23.8* 25.3* 26.7*  INR 2.09* 2.26* 2.42*      Assessment/Plan  Principal Problem:  *Systolic and diastolic CHF, acute on chronic, (BNP 3800) Active Problems:  Hypo-osmolality and hyponatremia  Elevated troponin, NSTEMI vs secondary elevation from CHF  Normocytic anemia  Atrial fibrillation, chronic  Trigeminal neuralgia  Moderate to severe pulmonary hypertension, PA pressure in 70s June 2013  Elevated INR  Non-ischemic cardiomyopathy  LBBB (left bundle branch block)  Chronic anticoagulation  HTN (hypertension)  Nausea  Abdominal pain  Acute urinary retention  Plan:  Per Dr. Royann Shivers, we can do outpatient cardiac cine MRI to evaluate for amyloidosis and explain restrictive CM.  Records were requested.  Consider DCCV.  INR is therapeutic.  BP and HR stable.  NA remains low and stable.  DC to asst living.    LOS: 9 days    HAGER, BRYAN 07/08/2012 8:50 AM  I have seen and examined the patient along with Wilburt Finlay, PA.  I have reviewed the chart, notes and new data.  I agree with PA's note.  Had urinary retention and now appears to have some postobstructive diuresis after catheter insertion. Suddenly eliminated additional 2000 mL extra compared to previous days and now weight is down to 53.7 kg (118 lb). Despite this, Na level and creat are stable and she has not been hypotensive.  PLAN: May need to decrease diuretics temporarily to avoid dehydration, but in the long term the current dose of diuretics was keeping her "even". Will need Urology follow up. Will add a pro BNp to labs to see what her "baseline" is.  Thurmon Fair, MD, [facility] [phone] 07/08/2012, 10:50 AM

## 2012-07-08 NOTE — Progress Notes (Signed)
ANTICOAGULATION CONSULT NOTE - Follow Up Consult  Pharmacy Consult for Coumadin  Indication: atrial fibrillation  Allergies  Allergen Reactions  . Penicillins Hives   Patient Measurements: Height: 5' (152.4 cm) Weight: 118 lb 8 oz (53.751 kg) (Scale B.) IBW/kg (Calculated) : 45.5   Vital Signs: Temp: 98.2 F (36.8 C) (08/05 0431) Temp src: Oral (08/05 0431) BP: 115/73 mmHg (08/05 0431) Pulse Rate: 102  (08/05 0431)  Labs:  Basename 07/08/12 0630 07/07/12 0645 07/06/12 1813 07/06/12 0635 07/05/12 1905 07/05/12 0914  HGB 9.5* 8.9* -- -- -- --  HCT 27.5* 26.5* -- 25.7* -- --  PLT 216 161 -- 155 -- --  APTT -- -- -- -- -- --  LABPROT 23.8* 25.3* -- 26.7* -- --  INR 2.09* 2.26* -- 2.42* -- --  HEPARINUNFRC -- -- -- 0.55 0.25* 0.23*  CREATININE 1.19* 1.18* -- 1.09 -- --  CKTOTAL -- -- 26 -- -- --  CKMB -- -- 1.6 -- -- --  TROPONINI -- -- <0.30 -- -- --    Estimated Creatinine Clearance: 27.1 ml/min (by C-G formula based on Cr of 1.19).  Home Coumadin dose:  7.5 mg po daily except 5 mg on Tue and Fri  Assessment: Patient is a 78 y.o female with afib who had been receiving Septra at home for an infection that developed after skin CA excision. Patient presented with supratherapeutic INR of 5.26 7/27 likely d/t interaction between septra and coumadin.Septra now discontinued.   S/p cardiac cath 8/1 and coumadin has been resumed.  INR is therapeutic at 2.09 however, pt did not receive a dose last night for unclear reasons. CBC is stable. No bleeding noted.    Goal of Therapy:  INR 2-3 Monitor platelets by anticoagulation protocol: Yes   Plan: 1. Coumadin 7.5mg  PO x 1 tonight 2. F/u AM INR  Lysle Pearl, PharmD, BCPS Pager # [phone] 07/08/2012 8:35 AM

## 2012-07-08 NOTE — Progress Notes (Signed)
Clinical social worker assisted with patient discharge to ALF, [facility].  CSW addressed all family questions and concerns. CSW copied chart and added all important documents. Patient was transferred by private vehicle. Clinical Social Worker will sign off for now as social work intervention is no longer needed.  Sabino Niemann, MSW, [employer] [phone]

## 2012-07-09 ENCOUNTER — Other Ambulatory Visit (HOSPITAL_COMMUNITY): Payer: Self-pay | Admitting: Cardiovascular Disease

## 2012-07-09 DIAGNOSIS — I509 Heart failure, unspecified: Secondary | ICD-10-CM

## 2012-07-09 DIAGNOSIS — E859 Amyloidosis, unspecified: Secondary | ICD-10-CM

## 2012-07-09 DIAGNOSIS — I272 Pulmonary hypertension, unspecified: Secondary | ICD-10-CM

## 2012-07-12 ENCOUNTER — Ambulatory Visit (HOSPITAL_COMMUNITY)
Admission: RE | Admit: 2012-07-12 | Discharge: 2012-07-12 | Disposition: A | Discharge status: Home / Self Care | Payer: Medicare Other | Source: Ambulatory Visit | Attending: Cardiovascular Disease | Admitting: Cardiovascular Disease

## 2012-07-12 DIAGNOSIS — I509 Heart failure, unspecified: Secondary | ICD-10-CM | POA: Insufficient documentation

## 2012-07-12 DIAGNOSIS — I359 Nonrheumatic aortic valve disorder, unspecified: Secondary | ICD-10-CM | POA: Insufficient documentation

## 2012-07-12 DIAGNOSIS — I272 Pulmonary hypertension, unspecified: Secondary | ICD-10-CM

## 2012-07-12 DIAGNOSIS — J90 Pleural effusion, not elsewhere classified: Secondary | ICD-10-CM | POA: Insufficient documentation

## 2012-07-12 DIAGNOSIS — I319 Disease of pericardium, unspecified: Secondary | ICD-10-CM | POA: Insufficient documentation

## 2012-07-12 DIAGNOSIS — I2789 Other specified pulmonary heart diseases: Secondary | ICD-10-CM | POA: Insufficient documentation

## 2012-07-12 DIAGNOSIS — E859 Amyloidosis, unspecified: Secondary | ICD-10-CM | POA: Insufficient documentation

## 2012-07-12 MED ORDER — GADOBENATE DIMEGLUMINE 529 MG/ML IV SOLN
15.0000 mL | Freq: Once | INTRAVENOUS | Status: AC
  Administered 2012-07-12: 15 mL via INTRAVENOUS

## 2012-08-23 ENCOUNTER — Encounter (HOSPITAL_COMMUNITY): Payer: Self-pay | Admitting: Family Medicine

## 2012-08-23 ENCOUNTER — Inpatient Hospital Stay (HOSPITAL_COMMUNITY)
Admission: EM | Admit: 2012-08-23 | Discharge: 2012-08-26 | DRG: 070 | Disposition: A | Discharge status: Nursing Home (Includes ICF) | Payer: Medicare Other | Attending: Internal Medicine | Admitting: Internal Medicine

## 2012-08-23 ENCOUNTER — Emergency Department (HOSPITAL_COMMUNITY): Payer: Medicare Other

## 2012-08-23 DIAGNOSIS — R110 Nausea: Secondary | ICD-10-CM

## 2012-08-23 DIAGNOSIS — I482 Chronic atrial fibrillation, unspecified: Secondary | ICD-10-CM | POA: Diagnosis present

## 2012-08-23 DIAGNOSIS — R7989 Other specified abnormal findings of blood chemistry: Secondary | ICD-10-CM | POA: Diagnosis present

## 2012-08-23 DIAGNOSIS — R791 Abnormal coagulation profile: Secondary | ICD-10-CM

## 2012-08-23 DIAGNOSIS — I447 Left bundle-branch block, unspecified: Secondary | ICD-10-CM | POA: Diagnosis present

## 2012-08-23 DIAGNOSIS — I2789 Other specified pulmonary heart diseases: Secondary | ICD-10-CM | POA: Diagnosis present

## 2012-08-23 DIAGNOSIS — J449 Chronic obstructive pulmonary disease, unspecified: Secondary | ICD-10-CM | POA: Diagnosis present

## 2012-08-23 DIAGNOSIS — E871 Hypo-osmolality and hyponatremia: Secondary | ICD-10-CM | POA: Diagnosis present

## 2012-08-23 DIAGNOSIS — I5022 Chronic systolic (congestive) heart failure: Secondary | ICD-10-CM | POA: Diagnosis present

## 2012-08-23 DIAGNOSIS — I428 Other cardiomyopathies: Secondary | ICD-10-CM | POA: Diagnosis present

## 2012-08-23 DIAGNOSIS — Z8249 Family history of ischemic heart disease and other diseases of the circulatory system: Secondary | ICD-10-CM

## 2012-08-23 DIAGNOSIS — D649 Anemia, unspecified: Secondary | ICD-10-CM | POA: Diagnosis present

## 2012-08-23 DIAGNOSIS — IMO0001 Reserved for inherently not codable concepts without codable children: Secondary | ICD-10-CM

## 2012-08-23 DIAGNOSIS — I509 Heart failure, unspecified: Secondary | ICD-10-CM | POA: Diagnosis present

## 2012-08-23 DIAGNOSIS — I272 Pulmonary hypertension, unspecified: Secondary | ICD-10-CM | POA: Diagnosis present

## 2012-08-23 DIAGNOSIS — R4182 Altered mental status, unspecified: Secondary | ICD-10-CM | POA: Diagnosis present

## 2012-08-23 DIAGNOSIS — G9349 Other encephalopathy: Principal | ICD-10-CM | POA: Diagnosis present

## 2012-08-23 DIAGNOSIS — I219 Acute myocardial infarction, unspecified: Secondary | ICD-10-CM | POA: Diagnosis present

## 2012-08-23 DIAGNOSIS — T481X5A Adverse effect of skeletal muscle relaxants [neuromuscular blocking agents], initial encounter: Secondary | ICD-10-CM | POA: Diagnosis present

## 2012-08-23 DIAGNOSIS — Z7901 Long term (current) use of anticoagulants: Secondary | ICD-10-CM

## 2012-08-23 DIAGNOSIS — I10 Essential (primary) hypertension: Secondary | ICD-10-CM | POA: Diagnosis present

## 2012-08-23 DIAGNOSIS — D509 Iron deficiency anemia, unspecified: Secondary | ICD-10-CM | POA: Diagnosis present

## 2012-08-23 DIAGNOSIS — Y92009 Unspecified place in unspecified non-institutional (private) residence as the place of occurrence of the external cause: Secondary | ICD-10-CM

## 2012-08-23 DIAGNOSIS — Z7982 Long term (current) use of aspirin: Secondary | ICD-10-CM

## 2012-08-23 DIAGNOSIS — T424X5A Adverse effect of benzodiazepines, initial encounter: Secondary | ICD-10-CM | POA: Diagnosis present

## 2012-08-23 DIAGNOSIS — Z23 Encounter for immunization: Secondary | ICD-10-CM

## 2012-08-23 DIAGNOSIS — Z880 Allergy status to penicillin: Secondary | ICD-10-CM

## 2012-08-23 DIAGNOSIS — Z79899 Other long term (current) drug therapy: Secondary | ICD-10-CM

## 2012-08-23 DIAGNOSIS — R338 Other retention of urine: Secondary | ICD-10-CM

## 2012-08-23 DIAGNOSIS — G500 Trigeminal neuralgia: Secondary | ICD-10-CM

## 2012-08-23 DIAGNOSIS — J4489 Other specified chronic obstructive pulmonary disease: Secondary | ICD-10-CM | POA: Diagnosis present

## 2012-08-23 DIAGNOSIS — N183 Chronic kidney disease, stage 3 unspecified: Secondary | ICD-10-CM | POA: Diagnosis present

## 2012-08-23 DIAGNOSIS — I4891 Unspecified atrial fibrillation: Secondary | ICD-10-CM

## 2012-08-23 DIAGNOSIS — R109 Unspecified abdominal pain: Secondary | ICD-10-CM

## 2012-08-23 DIAGNOSIS — I5043 Acute on chronic combined systolic (congestive) and diastolic (congestive) heart failure: Secondary | ICD-10-CM

## 2012-08-23 HISTORY — DX: Altered mental status, unspecified: R41.82

## 2012-08-23 HISTORY — DX: Anemia, unspecified: D64.9

## 2012-08-23 LAB — CBC WITH DIFFERENTIAL/PLATELET
Basophils Absolute: 0 10*3/uL (ref 0.0–0.1)
HCT: 22.2 % — ABNORMAL LOW (ref 36.0–46.0)
Hemoglobin: 7.3 g/dL — ABNORMAL LOW (ref 12.0–15.0)
Lymphocytes Relative: 19 % (ref 12–46)
Lymphs Abs: 1.2 10*3/uL (ref 0.7–4.0)
MCV: 86 fL (ref 78.0–100.0)
Monocytes Absolute: 0.9 10*3/uL (ref 0.1–1.0)
Monocytes Relative: 13 % — ABNORMAL HIGH (ref 3–12)
Neutro Abs: 4.4 10*3/uL (ref 1.7–7.7)
RBC: 2.58 MIL/uL — ABNORMAL LOW (ref 3.87–5.11)
RDW: 16.7 % — ABNORMAL HIGH (ref 11.5–15.5)
WBC: 6.6 10*3/uL (ref 4.0–10.5)

## 2012-08-23 LAB — URINALYSIS, ROUTINE W REFLEX MICROSCOPIC
Glucose, UA: NEGATIVE mg/dL
Hgb urine dipstick: NEGATIVE
Specific Gravity, Urine: 1.007 (ref 1.005–1.030)
pH: 8 (ref 5.0–8.0)

## 2012-08-23 LAB — COMPREHENSIVE METABOLIC PANEL
AST: 30 U/L (ref 0–37)
CO2: 27 mEq/L (ref 19–32)
Chloride: 86 mEq/L — ABNORMAL LOW (ref 96–112)
Creatinine, Ser: 1.21 mg/dL — ABNORMAL HIGH (ref 0.50–1.10)
GFR calc Af Amer: 48 mL/min — ABNORMAL LOW (ref >90)
GFR calc non Af Amer: 41 mL/min — ABNORMAL LOW (ref >90)
Glucose, Bld: 118 mg/dL — ABNORMAL HIGH (ref 70–99)
Total Bilirubin: 0.5 mg/dL (ref 0.3–1.2)

## 2012-08-23 LAB — LIPASE, BLOOD: Lipase: 41 U/L (ref 11–59)

## 2012-08-23 LAB — PROTIME-INR: INR: 3.09 — ABNORMAL HIGH (ref 0.00–1.49)

## 2012-08-23 LAB — AMMONIA: Ammonia: 20 umol/L (ref 11–60)

## 2012-08-23 MED ORDER — ONDANSETRON HCL 4 MG/2ML IJ SOLN
INTRAMUSCULAR | Status: AC
  Filled 2012-08-23: qty 2

## 2012-08-23 MED ORDER — SODIUM CHLORIDE 0.9 % IJ SOLN
3.0000 mL | Freq: Two times a day (BID) | INTRAMUSCULAR | Status: DC
  Administered 2012-08-23 – 2012-08-26 (×4): 3 mL via INTRAVENOUS

## 2012-08-23 MED ORDER — ACETAMINOPHEN 650 MG RE SUPP: 650.0000 mg | Freq: Four times a day (QID) | RECTAL | Status: DC | PRN

## 2012-08-23 MED ORDER — CARVEDILOL 6.25 MG PO TABS
6.2500 mg | ORAL_TABLET | Freq: Two times a day (BID) | ORAL | Status: DC
  Administered 2012-08-24 – 2012-08-26 (×5): 6.25 mg via ORAL
  Filled 2012-08-23 (×7): qty 1

## 2012-08-23 MED ORDER — LOSARTAN POTASSIUM 50 MG PO TABS
50.0000 mg | ORAL_TABLET | Freq: Every day | ORAL | Status: DC
  Administered 2012-08-23 – 2012-08-26 (×4): 50 mg via ORAL
  Filled 2012-08-23 (×4): qty 1

## 2012-08-23 MED ORDER — WARFARIN - PHARMACIST DOSING INPATIENT: Freq: Every day | Status: DC

## 2012-08-23 MED ORDER — SODIUM CHLORIDE 0.9 % IV SOLN
INTRAVENOUS | Status: DC
  Administered 2012-08-23: 21:00:00 via INTRAVENOUS

## 2012-08-23 MED ORDER — ONDANSETRON HCL 4 MG PO TABS: 4.0000 mg | ORAL_TABLET | Freq: Four times a day (QID) | ORAL | Status: DC | PRN

## 2012-08-23 MED ORDER — ASPIRIN EC 81 MG PO TBEC
81.0000 mg | DELAYED_RELEASE_TABLET | Freq: Every day | ORAL | Status: DC
  Administered 2012-08-24: 81 mg via ORAL
  Filled 2012-08-23: qty 1

## 2012-08-23 MED ORDER — ACETAMINOPHEN 325 MG PO TABS
650.0000 mg | ORAL_TABLET | Freq: Four times a day (QID) | ORAL | Status: DC | PRN
  Administered 2012-08-26: 650 mg via ORAL
  Filled 2012-08-23 (×2): qty 2

## 2012-08-23 MED ORDER — LOSARTAN POTASSIUM 50 MG PO TABS: 50.0000 mg | ORAL_TABLET | Freq: Every day | ORAL | Status: DC

## 2012-08-23 MED ORDER — ONDANSETRON HCL 4 MG/2ML IJ SOLN: 4.0000 mg | Freq: Four times a day (QID) | INTRAMUSCULAR | Status: DC | PRN

## 2012-08-23 MED ORDER — CARBAMAZEPINE ER 100 MG PO CP12: 100.0000 mg | ORAL_CAPSULE | Freq: Two times a day (BID) | ORAL | Status: DC

## 2012-08-23 MED ORDER — TIOTROPIUM BROMIDE MONOHYDRATE 18 MCG IN CAPS
18.0000 ug | ORAL_CAPSULE | Freq: Every day | RESPIRATORY_TRACT | Status: DC
  Administered 2012-08-24 – 2012-08-26 (×3): 18 ug via RESPIRATORY_TRACT
  Filled 2012-08-23: qty 5

## 2012-08-23 MED ORDER — WARFARIN SODIUM 4 MG PO TABS
4.0000 mg | ORAL_TABLET | Freq: Once | ORAL | Status: AC
  Administered 2012-08-23: 4 mg via ORAL
  Filled 2012-08-23: qty 1

## 2012-08-23 MED ORDER — CARBAMAZEPINE ER 100 MG PO TB12
100.0000 mg | ORAL_TABLET | Freq: Two times a day (BID) | ORAL | Status: DC
  Administered 2012-08-23 – 2012-08-26 (×6): 100 mg via ORAL
  Filled 2012-08-23 (×9): qty 1

## 2012-08-23 MED ORDER — ONDANSETRON HCL 4 MG/2ML IJ SOLN
4.0000 mg | Freq: Once | INTRAMUSCULAR | Status: AC
  Administered 2012-08-23: 4 mg via INTRAVENOUS

## 2012-08-23 NOTE — Progress Notes (Signed)
[facility] progress note. Chief complaint. Mildly elevated troponin. History of present illness. This 78 year old female admitted with acute encephalopathy thought likely due to recent use of baclofen. Patient has a history of atrial fib., systolic congestive heart failure, and a left bundle branch block. As part of workup serial troponins that were ordered and the first of these has returned mildly elevated at 0.41. A 12-lead EKG was done at the bedside per my request and this does show some ST elevation in V1 through V3 though this does not appear different from her prior EKGs. The patient has had no complaints of chest pain he denies chest pain or dyspnea to me at the bedside. Vital signs. Temperature 97.7, pulse 98, respiration 16, blood pressure 135/75. O2 sats 98%. General appearance. Frail elderly female in no distress. Alert, cooperative, pleasant. Cardiac. Irregularly irregular. Lungs. Clear and equal. Abdomen. Soft with positive bowel sounds and no pain. Impression/plan Problem #1. Mildly elevated troponin. I discussed the case with [facility] on-call for [facility] heart and vascular. Given no significant EKG changes, lack of chest pain, cardiology had no further recommendations at this time other than continuing to follow troponin levels which are pending every 6 hours. We'll follow for these results. On-call cardiologist asked me to put Dr. Tresa Endo as a consult and he will see the patient in the a.m. I will call him sooner if the troponins appear to be climbing. Patient is anticoagulated already on Coumadin. She is also on aspirin and beta blocker concurrently.

## 2012-08-23 NOTE — H&P (Signed)
[facility] History and Physical  Jessicca Stitzer AOZ:[id number] DOB: 19-Sep-1929 DOA: 08/23/2012   PCP: No primary provider on file.   Chief Complaint: Confusion  HPI:  78 year old female presents with acute mental status change. The patient lives in an assisted-living-type environment. Apparently, the patient was started on baclofen on Wednesday which was a new medication. The patient's daughter noted worsening confusion starting on Thursday with decreased oral intake. In addition, the patient has had dry heaves for the last 24 hours. The patient is confused at this time. History is obtained from speaking with the patient's son at the bedside and from the ER physician. The patient's son states that the patient has been in her usual state of health even one week ago and today her mentation is definite decline. The patient has had numerous admissions for hyponatremia in the past. Her sodium is 124 today which is near her baseline. Serum creatinine was 1.21 which is at her baseline. However it was noted that the patient's hemoglobin was 7.3 which is lower than her usual baseline of 9.5-10.5. There's been no history of epistaxis, hemoptysis, hematemesis, hematuria, or hematochezia or melena. It is unclear whether the patient had a recent endoscopy. Hemoccult emergency department was negative.  Currently, the patient denies any headache, dizziness, visual changes, chest pain, shortness breath, face pain, headache, orthopnea, tissue area, hematuria, abdominal pain, focal extremity weakness. Assessment/Plan: Acute encephalopathy -This is likely multifactorial including but not limited to recent use of baclofen, Restoril, hyponatremia, anemia with recent drop in hemoglobin. -Check TSH, B12, RPR, folate, NH3 -Check urine osmolarity, serum osmolarity, urine sodium, urine creatinine, Tegretol level, lipase -Give the patient judicious fluid -MRI brain if there is no improvement and above workup is  negative Atrial fibrillation -Continue warfarin, pharmacy to assist in dosing -Rate controlled at this point Anemia -Check iron studies in addition to a folate and B12 -Repeat Hemoccult Trigeminal neuralgia history -Patient has been resistant to discontinue Tegretol which can cause hyponatremia -Symptomatic treatment for now Chronic congestive heart failure, systolic -Patient is compensated at this time without any signs of worsening -Continue carvedilol, losartan -Hold Lasix temporarily to evaluate response to intravenous fluids and serum sodium Left bundle-branch block -No new changes on EKG  Active Problems:  * No active hospital problems. *        Past Medical History  Diagnosis Date  . COPD (chronic obstructive pulmonary disease)     Never smoked. Questionable diagnosis.  . Atrial fibrillation   . Osteopenia   . Insomnia   . Hyponatremia   . Hypertension   . UTI (lower urinary tract infection)   . Bursitis of hip   . Trigeminal neuralgia    Past Surgical History  Procedure Date  . Appendectomy   . Tonsillectomy and adenoidectomy    Social History:  reports that she has never smoked. She does not have any smokeless tobacco history on file. She reports that she does not drink alcohol. Her drug history not on file.  Allergies  Allergen Reactions  . Penicillins Hives    Family History  Problem Relation Age of Onset  . Heart attack Brother   . Heart attack Father     Prior to Admission medications   Medication Sig Start Date End Date Taking? Authorizing Provider  aspirin EC 81 MG EC tablet Take 1 tablet (81 mg total) by mouth daily. 07/08/12 07/08/13 Yes Novlet Adelina Mings, MD  baclofen (LIORESAL) 10 MG tablet Take 10 mg by mouth every 8 (eight) hours  as needed. For neuralgia pain   Yes Historical Provider, MD  Calcium Carb-Cholecalciferol (CALCIUM-VITAMIN D) 500-400 MG-UNIT TABS Take 2 tablets by mouth daily.   Yes Historical Provider, MD  carbamazepine  (CARBATROL) 100 MG 12 hr capsule Take 100 mg by mouth 2 (two) times daily.   Yes Historical Provider, MD  carvedilol (COREG) 6.25 MG tablet Take 1 tablet (6.25 mg total) by mouth 2 (two) times daily with a meal. 07/08/12  Yes Novlet Adelina Mings, MD  furosemide (LASIX) 80 MG tablet Take 1 tablet (80 mg total) by mouth daily. 07/08/12 07/08/13 Yes Novlet Adelina Mings, MD  losartan (COZAAR) 50 MG tablet Take 1 tablet (50 mg total) by mouth daily. 07/08/12  Yes Novlet Adelina Mings, MD  potassium chloride SA (K-DUR,KLOR-CON) 20 MEQ tablet Take 1 tablet (20 mEq total) by mouth 2 (two) times daily. 07/08/12 07/08/13 Yes Novlet Adelina Mings, MD  temazepam (RESTORIL) 15 MG capsule Take 15 mg by mouth at bedtime as needed.   Yes Historical Provider, MD  tiotropium (SPIRIVA) 18 MCG inhalation capsule Place 18 mcg into inhaler and inhale daily.   Yes Historical Provider, MD  warfarin (COUMADIN) 5 MG tablet Take 5-7.5 mg by mouth daily. 1 tablet on Tuesday and Friday 1.5 tablet all other days   Yes Historical Provider, MD    Review of Systems:  Constitutional:  No weight loss, night sweats, Fevers, chills, fatigue.  Head&Eyes: No headache.  No vision loss.  No eye pain or scotoma ENT:  No Difficulty swallowing,Tooth/dental problems,Sore throat,  No ear ache, post nasal drip,  Cardio-vascular:  No chest pain, Orthopnea, PND, swelling in lower extremities,  dizziness, palpitations  GI:  No heartburn, indigestion, abdominal pain, nausea, vomiting, diarrhea,loss of appetite, hematochezia, melena Resp:  No shortness of breath with exertion or at rest. No excess mucus, no productive cough, No non-productive cough, No coughing up of blood.No change in color of mucus.No wheezing.No chest wall deformity  Skin:  no rash or lesions.  GU:  no dysuria, change in color of urine, no urgency or frequency. No flank pain.  Musculoskeletal:  No joint pain or swelling. No decreased range of motion. No back pain.  Psych:  No change in mood or  affect. No depression or anxiety. Neurologic: No headache, no dysesthesia, no focal weakness, no vision loss. No syncope  Physical Exam: Filed Vitals:   08/23/12 1430 08/23/12 1530 08/23/12 1738 08/23/12 1800  BP: 130/115 124/62 141/87 130/76  Pulse: 129 112  90  Temp:      TempSrc:      Resp: 28 28 20 17   SpO2: 99% 97% 100% 97%   General:  A&O x 3, NAD, nontoxic, pleasant/cooperative Head/Eye: No conjunctival hemorrhage, no icterus, [place]/AT, No nystagmus ENT:  No icterus,  No thrush, good dentition, no pharyngeal exudate Neck:  No masses, no lymphadenpathy, no bruits CV:  IRRR, no rub, no gallop, no S3 Lung:  CTAB, good air movement, no wheeze, no rhonchi Abdomen: soft/NT, +BS, nondistended, no peritoneal signs Ext: No cyanosis, No rashes, No petechiae, No lymphangitis, No edema Neuro: CNII-XII intact, strength 4/5 in bilateral upper and lower extremities, no dysmetria  Labs on Admission:  Basic Metabolic Panel:  Lab 08/23/12 1610  NA 124*  K 3.9  CL 86*  CO2 27  GLUCOSE 118*  BUN 30*  CREATININE 1.21*  CALCIUM 9.6  MG --  PHOS --   Liver Function Tests:  Lab 08/23/12 1500  AST 30  ALT 24  ALKPHOS  116  BILITOT 0.5  PROT 8.0  ALBUMIN 3.2*   No results found for this basename: LIPASE:5,AMYLASE:5 in the last 168 hours No results found for this basename: AMMONIA:5 in the last 168 hours CBC:  Lab 08/23/12 1500  WBC 6.6  NEUTROABS 4.4  HGB 7.3*  HCT 22.2*  MCV 86.0  PLT 242   Cardiac Enzymes: No results found for this basename: CKTOTAL:5,CKMB:5,CKMBINDEX:5,TROPONINI:5 in the last 168 hours BNP: No components found with this basename: POCBNP:5 CBG: No results found for this basename: GLUCAP:5 in the last 168 hours  Radiological Exams on Admission: Dg Chest 2 View  08/23/2012  *RADIOLOGY REPORT*  Clinical Data: Atrial fibrillation.  CHEST - 2 VIEW  Comparison: 06/29/2012.  Findings: Stable enlarged cardiac silhouette and linear scarring at the left lung  base.  Stable hyperexpansion of the lungs with mild diffuse peribronchial thickening and accentuation of the interstitial markings.  Resolved pleural fluid on the right. Stable bilateral nipple shadows.  Diffuse osteopenia.  Thoracic spine degenerative changes.  IMPRESSION:  1.  No acute abnormality. 2.  Stable cardiomegaly and changes of COPD and chronic bronchitis.   Original Report Authenticated By: Darrol Angel, M.D.    Ct Head Wo Contrast  08/23/2012  *RADIOLOGY REPORT*  Clinical Data:  confusion, altered mental status  CT HEAD WITHOUT CONTRAST  Technique:  Contiguous axial images were obtained from the base of the skull through the vertex without contrast.  Comparison: None.  Findings: No skull fracture is noted.  Paranasal sinuses and mastoid air cells are unremarkable.  Mild cerebral atrophy.  No intracranial hemorrhage, mass effect or midline shift.  No acute infarction.  No mass lesion is noted on this unenhanced scan.  Periventricular and patchy subcortical white matter decreased attenuation probable due to chronic small vessel ischemic changes.  IMPRESSION: No acute intracranial abnormality.  Mild cerebral atrophy. Periventricular and patchy subcortical white matter decreased attenuation probable due to chronic small vessel ischemic changes.   Original Report Authenticated By: Natasha Mead, M.D.     EKG: Independently reviewed. Unchanged LBBB    Time spend:70 minutes Code Status: full Family Communication: son at bedside   Kemarion Abbey, DO  [facility] Pager [phone]  If 7PM-7AM, please contact night-coverage [email or web address] Password [facility] 08/23/2012, 6:42 PM

## 2012-08-23 NOTE — ED Notes (Signed)
Per EMS, pt started having confusion yesterday. Pt usually A&Ox3 and lives on her own. CBG 117. BP 140/100. Pt in Afib RVR.

## 2012-08-23 NOTE — ED Provider Notes (Signed)
History     CSN: [id number]  Arrival date & time 08/23/12  1401   First MD Initiated Contact with Patient 08/23/12 1415      Chief Complaint  Patient presents with  . Altered Mental Status  . Atrial Fibrillation   Patient is a 78 year old female who presents to emergency department with complaints of right-sided facial pain, decreased by mouth intake, and altered mental status per family. Family states that patient has had increasing pain due to trigeminal neuralgia. Thus, she was started on baclofen 2 days ago. Since that time family reports gradual increase in patient's confusion level. They state that patient normally is alert and oriented x3, but currently is only oriented to person. They also state that patient has been intermittently answering questions inappropriately. Associated symptoms include decreased by mouth intake and nausea. Patient denies any chest pain, shortness of breath, fevers, or any other symptoms.      (Consider location/radiation/quality/duration/timing/severity/associated sxs/prior treatment) Patient is a 78 y.o. female presenting with altered mental status. The history is provided by the patient and a relative. No language interpreter was used.  Altered Mental Status This is a new problem. The current episode started in the past 7 days. The problem occurs constantly. The problem has been gradually worsening. Pertinent negatives include no abdominal pain, chest pain or fever. Nothing aggravates the symptoms. She has tried nothing for the symptoms. The treatment provided no relief.    Past Medical History  Diagnosis Date  . COPD (chronic obstructive pulmonary disease)     Never smoked. Questionable diagnosis.  . Atrial fibrillation   . Osteopenia   . Insomnia   . Hyponatremia   . Hypertension   . UTI (lower urinary tract infection)   . Bursitis of hip   . Trigeminal neuralgia     Past Surgical History  Procedure Date  . Appendectomy   . Tonsillectomy  and adenoidectomy     Family History  Problem Relation Age of Onset  . Heart attack Brother   . Heart attack Father     History  Substance Use Topics  . Smoking status: Never Smoker   . Smokeless tobacco: Not on file  . Alcohol Use: No    OB History    Grav Para Term Preterm Abortions TAB SAB Ect Mult Living                  Review of Systems  Constitutional: Negative for fever.  Cardiovascular: Negative for chest pain.  Gastrointestinal: Negative for abdominal pain.  Psychiatric/Behavioral: Positive for altered mental status.  All other systems reviewed and are negative.    Allergies  Penicillins  Home Medications   Current Outpatient Rx  Name Route Sig Dispense Refill  . ASPIRIN 81 MG PO TBEC Oral Take 1 tablet (81 mg total) by mouth daily.    [name] BACLOFEN 10 MG PO TABS Oral Take 10 mg by mouth every 8 (eight) hours as needed. For neuralgia pain    . CALCIUM-VITAMIN D 500-400 MG-UNIT PO TABS Oral Take 2 tablets by mouth daily.    [name] CARBAMAZEPINE ER 100 MG PO CP12 Oral Take 100 mg by mouth 2 (two) times daily.    [name] CARVEDILOL 6.25 MG PO TABS Oral Take 1 tablet (6.25 mg total) by mouth 2 (two) times daily with a meal. 60 tablet 0  . FUROSEMIDE 80 MG PO TABS Oral Take 1 tablet (80 mg total) by mouth daily. 30 tablet 0  . LOSARTAN POTASSIUM 50 MG  PO TABS Oral Take 1 tablet (50 mg total) by mouth daily. 30 tablet 0  . POTASSIUM CHLORIDE CRYS ER 20 MEQ PO TBCR Oral Take 1 tablet (20 mEq total) by mouth 2 (two) times daily. 60 tablet 0  . TEMAZEPAM 15 MG PO CAPS Oral Take 15 mg by mouth at bedtime as needed.    [name] TIOTROPIUM BROMIDE MONOHYDRATE 18 MCG IN CAPS Inhalation Place 18 mcg into inhaler and inhale daily.    . WARFARIN SODIUM 5 MG PO TABS Oral Take 5-7.5 mg by mouth daily. 1 tablet on Tuesday and Friday 1.5 tablet all other days      BP 130/115  Pulse 129  Temp 97.6 F (36.4 C) (Oral)  Resp 28  SpO2 99%  Physical Exam  Nursing note and vitals  reviewed. Constitutional: She appears well-developed and well-nourished. No distress.  HENT:  Head: Normocephalic and atraumatic.  Right Ear: External ear normal.  Left Ear: External ear normal.  Nose: Nose normal.  Mouth/Throat: Oropharynx is clear and moist.  Eyes: Conjunctivae normal and EOM are normal. Pupils are equal, round, and reactive to light.  Neck: Normal range of motion. Neck supple.  Cardiovascular: Normal rate and intact distal pulses.  An irregularly irregular rhythm present.  Pulmonary/Chest: Effort normal and breath sounds normal.  Abdominal: Soft. Bowel sounds are normal.  Genitourinary: Guaiac negative stool.  Musculoskeletal: Normal range of motion. She exhibits no edema and no tenderness.  Neurological: She is alert. She has normal strength. No cranial nerve deficit or sensory deficit. Coordination normal.       Oriented only to person - change from baseline per family.    Skin: Skin is warm and dry.  Psychiatric: She has a normal mood and affect.    ED Course  Procedures (including critical care time)  Labs Reviewed  URINALYSIS, ROUTINE W REFLEX MICROSCOPIC - Abnormal; Notable for the following:    Leukocytes, UA TRACE (*)     All other components within normal limits  CBC WITH DIFFERENTIAL - Abnormal; Notable for the following:    RBC 2.58 (*)     Hemoglobin 7.3 (*)     HCT 22.2 (*)     RDW 16.7 (*)     Monocytes Relative 13 (*)     All other components within normal limits  COMPREHENSIVE METABOLIC PANEL - Abnormal; Notable for the following:    Sodium 124 (*)     Chloride 86 (*)     Glucose, Bld 118 (*)     BUN 30 (*)     Creatinine, Ser 1.21 (*)     Albumin 3.2 (*)     GFR calc non Af Amer 41 (*)     GFR calc Af Amer 48 (*)     All other components within normal limits  PROTIME-INR - Abnormal; Notable for the following:    Prothrombin Time 30.2 (*)     INR 3.09 (*)     All other components within normal limits  URINE MICROSCOPIC-ADD ON   OCCULT BLOOD, POC DEVICE   Dg Chest 2 View  08/23/2012  *RADIOLOGY REPORT*  Clinical Data: Atrial fibrillation.  CHEST - 2 VIEW  Comparison: 06/29/2012.  Findings: Stable enlarged cardiac silhouette and linear scarring at the left lung base.  Stable hyperexpansion of the lungs with mild diffuse peribronchial thickening and accentuation of the interstitial markings.  Resolved pleural fluid on the right. Stable bilateral nipple shadows.  Diffuse osteopenia.  Thoracic spine degenerative changes.  IMPRESSION:  1.  No acute abnormality. 2.  Stable cardiomegaly and changes of COPD and chronic bronchitis.   Original Report Authenticated By: Darrol Angel, M.D.    Ct Head Wo Contrast  08/23/2012  *RADIOLOGY REPORT*  Clinical Data:  confusion, altered mental status  CT HEAD WITHOUT CONTRAST  Technique:  Contiguous axial images were obtained from the base of the skull through the vertex without contrast.  Comparison: None.  Findings: No skull fracture is noted.  Paranasal sinuses and mastoid air cells are unremarkable.  Mild cerebral atrophy.  No intracranial hemorrhage, mass effect or midline shift.  No acute infarction.  No mass lesion is noted on this unenhanced scan.  Periventricular and patchy subcortical white matter decreased attenuation probable due to chronic small vessel ischemic changes.  IMPRESSION: No acute intracranial abnormality.  Mild cerebral atrophy. Periventricular and patchy subcortical white matter decreased attenuation probable due to chronic small vessel ischemic changes.   Original Report Authenticated By: Natasha Mead, M.D.      Date: 08/23/2012  Rate: 146  - poor baseline and rate estimated to be <100  Rhythm: atrial fibrillation  QRS Axis: normal  Intervals: normal  ST/T Wave abnormalities: normal  Conduction Disutrbances:left bundle branch block  Narrative Interpretation:   Old EKG Reviewed: unchanged   1. Altered mental status   2. Supratherapeutic INR   3. Hyponatremia        MDM    Patient is a 78 year old female who presents to emergency department for altered metal status.  Upon arrival in the emergency department patient noted be afebrile and vital signs unremarkable. EKG completed and an initial rate of 146 was documented however EKG shows poor baseline and upon further evaluation heart rate noted to be less than 100.  On exam patient oriented only to person this is a change from her normal baseline otherwise as above and noncontributory. Workup for altered mental status included intracranial abnormality, infectious etiology, metabolic abnormality.  Review of patient's imaging showed no evidence of acute abnormality. Review of patient's labs remarkable for hyponatremia, supratherapeutic INR, and hemoglobin of 7.3.  Hemoccult completed and negative.  Possible etiology of patient AMS, could be baclofen side effect. Patient admitted for further workup of AMS and monitoring.       Johnney Ou, MD 08/23/12 1755

## 2012-08-23 NOTE — Progress Notes (Signed)
ANTICOAGULATION CONSULT NOTE - Initial Consult  Pharmacy Consult for Coumadin Indication: atrial fibrillation  Allergies  Allergen Reactions  . Penicillins Hives    Patient Measurements: Height: 5\' 3"  (160 cm) Weight: 126 lb 1.6 oz (57.199 kg) IBW/kg (Calculated) : 52.4   Vital Signs: Temp: 97.8 F (36.6 C) (09/20 2000) Temp src: Oral (09/20 2000) BP: 131/69 mmHg (09/20 2000) Pulse Rate: 112  (09/20 2000)  Labs:  Basename 08/23/12 1500  HGB 7.3*  HCT 22.2*  PLT 242  APTT --  LABPROT 30.2*  INR 3.09*  HEPARINUNFRC --  CREATININE 1.21*  CKTOTAL --  CKMB --  TROPONINI --    Estimated Creatinine Clearance: 30.7 ml/min (by C-G formula based on Cr of 1.21).   Medical History: Past Medical History  Diagnosis Date  . COPD (chronic obstructive pulmonary disease)     Never smoked. Questionable diagnosis.  . Atrial fibrillation   . Osteopenia   . Insomnia   . Hyponatremia   . Hypertension   . UTI (lower urinary tract infection)   . Bursitis of hip   . Trigeminal neuralgia     Medications:  Scheduled:    . aspirin EC  81 mg Oral Daily  . carbamazepine  100 mg Oral BID  . carvedilol  6.25 mg Oral BID WC  . losartan  50 mg Oral Daily  . ondansetron (ZOFRAN) IV  4 mg Intravenous Once  . sodium chloride  3 mL Intravenous Q12H  . tiotropium  18 mcg Inhalation Daily  . DISCONTD: carbamazepine  100 mg Oral BID  . DISCONTD: losartan  50 mg Oral Daily   Infusions:    . sodium chloride     PRN: acetaminophen, acetaminophen, ondansetron (ZOFRAN) IV, ondansetron  Assessment: Angela Franco is a 78 year old female admitted with AMS and on chronic coumadin for atrial fibrillation to be continued in hospital. In the nursing home, patient takes 7.5mg  on all days except Tuesday and Friday where she takes 5mg . Last dose taken was 7.5mg  on 9/19. INR is 3.09.   Noted that patient's hgb is lower than baseline at 7.3. No bleeding has been noted thus far.  Plts are wnl.    Goal of Therapy:  INR 2-3 Monitor platelets by anticoagulation protocol: Yes   Plan:  Coumadin 4mg  po x 1 dose today  PT/INR daily   Thank you,  Brett Fairy, PharmD 08/23/2012 8:29 PM

## 2012-08-23 NOTE — ED Provider Notes (Signed)
I have supervised the resident on the management of this patient and agree with the note above. I personally interviewed and examined the patient and my addendum is below.   Angela Franco is a 78 y.o. female hx of COPD, HTN, UTI, dementia, trigeminal neuralgia, afib on coumadin, here with AMS. Was started on baclofen 2 days ago. Then had progressive confusion at the assisted living. Vitals stable. Patient A&O x 1 only. Moving all extremities. CT head nl, labs baseline, UA and CXR nl. Will admit for AMS likely secondary to medication side effect.   Level V caveat- AMS    Richardean Canal, MD 08/23/12 1759

## 2012-08-24 ENCOUNTER — Encounter (HOSPITAL_COMMUNITY): Payer: Self-pay | Admitting: Cardiology

## 2012-08-24 DIAGNOSIS — Z7901 Long term (current) use of anticoagulants: Secondary | ICD-10-CM

## 2012-08-24 DIAGNOSIS — G500 Trigeminal neuralgia: Secondary | ICD-10-CM

## 2012-08-24 DIAGNOSIS — R4182 Altered mental status, unspecified: Secondary | ICD-10-CM

## 2012-08-24 DIAGNOSIS — D649 Anemia, unspecified: Secondary | ICD-10-CM

## 2012-08-24 DIAGNOSIS — R7989 Other specified abnormal findings of blood chemistry: Secondary | ICD-10-CM

## 2012-08-24 HISTORY — DX: Altered mental status, unspecified: R41.82

## 2012-08-24 HISTORY — DX: Anemia, unspecified: D64.9

## 2012-08-24 LAB — CBC
HCT: 20.1 % — ABNORMAL LOW (ref 36.0–46.0)
Hemoglobin: 6.6 g/dL — CL (ref 12.0–15.0)
MCV: 86.3 fL (ref 78.0–100.0)
RDW: 17 % — ABNORMAL HIGH (ref 11.5–15.5)
WBC: 4.9 10*3/uL (ref 4.0–10.5)

## 2012-08-24 LAB — BASIC METABOLIC PANEL
BUN: 27 mg/dL — ABNORMAL HIGH (ref 6–23)
CO2: 27 mEq/L (ref 19–32)
Chloride: 86 mEq/L — ABNORMAL LOW (ref 96–112)
Creatinine, Ser: 1.29 mg/dL — ABNORMAL HIGH (ref 0.50–1.10)

## 2012-08-24 LAB — HAPTOGLOBIN: Haptoglobin: 198 mg/dL (ref 45–215)

## 2012-08-24 LAB — RAPID URINE DRUG SCREEN, HOSP PERFORMED
Amphetamines: NOT DETECTED
Benzodiazepines: NOT DETECTED
Tetrahydrocannabinol: NOT DETECTED

## 2012-08-24 LAB — PROTIME-INR: INR: 2.75 — ABNORMAL HIGH (ref 0.00–1.49)

## 2012-08-24 LAB — LIPID PANEL
Cholesterol: 157 mg/dL (ref 0–200)
Triglycerides: 51 mg/dL (ref <150)
VLDL: 10 mg/dL (ref 0–40)

## 2012-08-24 LAB — FERRITIN: Ferritin: 26 ng/mL (ref 10–291)

## 2012-08-24 LAB — HEMOGLOBIN AND HEMATOCRIT, BLOOD
HCT: 24.3 % — ABNORMAL LOW (ref 36.0–46.0)
Hemoglobin: 8.2 g/dL — ABNORMAL LOW (ref 12.0–15.0)

## 2012-08-24 LAB — IRON AND TIBC: TIBC: 392 ug/dL (ref 250–470)

## 2012-08-24 LAB — CREATININE, URINE, RANDOM: Creatinine, Urine: 60.25 mg/dL

## 2012-08-24 LAB — VITAMIN B12: Vitamin B-12: 1098 pg/mL — ABNORMAL HIGH (ref 211–911)

## 2012-08-24 MED ORDER — PHYTONADIONE 5 MG PO TABS
5.0000 mg | ORAL_TABLET | Freq: Once | ORAL | Status: AC
  Administered 2012-08-24: 5 mg via ORAL
  Filled 2012-08-24: qty 1

## 2012-08-24 MED ORDER — ENSURE COMPLETE PO LIQD
237.0000 mL | Freq: Two times a day (BID) | ORAL | Status: DC
  Administered 2012-08-24 – 2012-08-26 (×4): 237 mL via ORAL

## 2012-08-24 MED ORDER — TEMAZEPAM 15 MG PO CAPS
15.0000 mg | ORAL_CAPSULE | Freq: Once | ORAL | Status: AC
  Administered 2012-08-24: 15 mg via ORAL
  Filled 2012-08-24: qty 1

## 2012-08-24 NOTE — Progress Notes (Signed)
Pt has questionable memory of last bowel movement. Requesting for a stool softener. Thanks Ancil Linsey RN

## 2012-08-24 NOTE — Consult Note (Signed)
Referring Physician: Tat   Chief Complaint:  HPI: Angela Franco is an 78 y.o. female with afib on coumadin, compensated CHF, and CKD III with baseline cr 1.21; admitted to the hospital 08/23/12 with altered mental status, felt to be acute encephalopathy confounded by profound anemia and chronic hyponatremia (122-128).  She developed increased confusion, anorexia and vomiting on  08/22/12 after starting Baclofen for pain related to chronic trigeminal neuralgia. Neurology consult requested.  Patient presently unreliable historian.  Family at bedside, reports her to be mentally intact until a few days ago.  Yesterday could not make complete sentences.  She has had confusion in past with low sodium, but never to this extent.  With respect to neuralgia, she has had it for many years.  Pain is intermittent involving right face.  Past Medical History  Diagnosis Date  . COPD (chronic obstructive pulmonary disease)     Never smoked. Questionable diagnosis.  . Atrial fibrillation   . Osteopenia   . Insomnia   . Hyponatremia   . Hypertension   . UTI (lower urinary tract infection)   . Bursitis of hip   . Trigeminal neuralgia   . Altered mental status 08/24/2012    Past Surgical History  Procedure Date  . Appendectomy   . Tonsillectomy and adenoidectomy     Family History  Problem Relation Age of Onset  . Heart attack Brother   . Heart attack Father    Social History: resides at ALF.  No tobacco, ETOH or IDU. Allergies:  Allergies  Allergen Reactions  . Penicillins Hives    Medications: Scheduled:   . aspirin EC  81 mg Oral Daily  . carbamazepine  100 mg Oral BID  . carvedilol  6.25 mg Oral BID WC  . feeding supplement  237 mL Oral BID BM  . losartan  50 mg Oral Daily  . ondansetron (ZOFRAN) IV  4 mg Intravenous Once  . sodium chloride  3 mL Intravenous Q12H  . tiotropium  18 mcg Inhalation Daily  . warfarin  4 mg Oral Once  . Warfarin - Pharmacist Dosing Inpatient   Does not  apply q1800  . DISCONTD: carbamazepine  100 mg Oral BID  . DISCONTD: losartan  50 mg Oral Daily    ROS: unreliable historian.  Family supports the following: no recent fever, chills or URI sx.  Intermittent right facial pain, none presently.  No change in [place] or hearing.  No headaches or dizziness.  No chest pain or SOB.  No extremity weakness or altered sensation otherwise.  Physical Examination: Blood pressure 138/66, pulse 104, temperature 97.7 F (36.5 C), temperature source Oral, resp. rate 20, height 5\' 3"  (1.6 m), weight 57.199 kg (126 lb 1.6 oz), SpO2 94.00%. Telemetry: AF   Neurologic Examination: Mental Status: Alert, oriented to person. Can not call date/place.  When asked the year, she reports" 20. 21.23."  Month- 7.  Able to name the month after September but not before.  Cannot spell world backwards.  Poor recall.  Naming and purpose intact.  Repetition fair.  Speech fluent without evidence of aphasia.  Able to follow 3 step commands with minimal difficulty. Cranial Nerves: II- Visual fields grossly intact. III/IV/VI-Extraocular movements intact.  Pupils reactive bilaterally. V/VII-Smile symmetric- no sensory abn or pain. VIII-grossly intact IX/X-normal gag XI-bilateral shoulder shrug XII-midline tongue extension Motor: 5/5 bilaterally with normal tone and bulk Sensory: Pinprick and light touch intact throughout, bilaterally Deep Tendon Reflexes: 2+ and symmetric throughout Plantars: Downgoing bilaterally Cerebellar:  Normal finger-to-nose, normal rapid alternating movements and normal heel-to-shin test.   Basic Metabolic Panel:  Lab 08/24/12 1610 08/23/12 1500  NA 122* 124*  K 3.5 3.9  CL 86* 86*  CO2 27 27  GLUCOSE 95 118*  BUN 27* 30*  CREATININE 1.29* 1.21*  CALCIUM 9.1 9.6  MG -- --  PHOS -- --   Liver Function Tests:  Lab 08/23/12 1500  AST 30  ALT 24  ALKPHOS 116  BILITOT 0.5  PROT 8.0  ALBUMIN 3.2*   CBC:  Lab 08/24/12 0535 08/23/12 1500    WBC 4.9 6.6  NEUTROABS -- 4.4  HGB 6.6* 7.3*  HCT 20.1* 22.2*  MCV 86.3 86.0  PLT 247 242   Cardiac Enzymes:  Lab 08/24/12 0120 08/23/12 2032  CKTOTAL -- --  CKMB -- --  CKMBINDEX -- --  TROPONINI 0.40* 0.46*   Fasting Lipid Panel:  Lab 08/24/12 0535  CHOL 157  HDL 51  LDLCALC 96  TRIG 51  CHOLHDL 3.1  LDLDIRECT --   Thyroid Function Tests:  Lab 08/23/12 2030  TSH 3.043  T4TOTAL --  FREET4 --  T3FREE --  THYROIDAB --   Coagulation:  Lab 08/24/12 0536 08/23/12 1500  LABPROT 27.7* 30.2*  INR 2.75* 3.09*   Urine Drug Screen: Drugs of Abuse     Component Value Date/Time   LABOPIA POSITIVE* 08/24/2012 1119   COCAINSCRNUR NONE DETECTED 08/24/2012 1119   LABBENZ NONE DETECTED 08/24/2012 1119   AMPHETMU NONE DETECTED 08/24/2012 1119   THCU NONE DETECTED 08/24/2012 1119   LABBARB NONE DETECTED 08/24/2012 1119    Urinalysis:  Lab 08/23/12 1449  COLORURINE YELLOW  LABSPEC 1.007  PHURINE 8.0  GLUCOSEU NEGATIVE  HGBUR NEGATIVE  BILIRUBINUR NEGATIVE  KETONESUR NEGATIVE  PROTEINUR NEGATIVE  UROBILINOGEN 0.2  NITRITE NEGATIVE  LEUKOCYTESUR TRACE*    08/23/2012 CHEST - 2 VIEW    Findings: Stable enlarged cardiac silhouette and linear scarring at the left lung base.  Stable hyperexpansion of the lungs with mild diffuse peribronchial thickening and accentuation of the interstitial markings.  Resolved pleural fluid on the right. Stable bilateral nipple shadows.  Diffuse osteopenia.  Thoracic spine degenerative changes.  IMPRESSION:  1.  No acute abnormality. 2.  Stable cardiomegaly and changes of COPD and chronic bronchitis.    Darrol Angel, M.D.    08/23/2012 CT HEAD WITHOUT CONTRAST  Findings: No skull fracture is noted.  Paranasal sinuses and mastoid air cells are unremarkable.  Mild cerebral atrophy.  No intracranial hemorrhage, mass effect or midline shift.  No acute infarction.  No mass lesion is noted on this unenhanced scan.  Periventricular and patchy subcortical  white matter decreased attenuation probable due to chronic small vessel ischemic changes.  IMPRESSION: No acute intracranial abnormality.  Mild cerebral atrophy. Periventricular and patchy subcortical white matter decreased attenuation probable due to chronic small vessel ischemic changes. Lang Snow POP, M.D.     Assessment: Angela Franco is an 78 y.o. female admitted to the hospital 08/23/12 with altered mental status, felt to be iatrogenic acute encephalopathy confounded by profound anemia and chronic hyponatremia (122-128).  She developed increased confusion, anorexia and vomiting on  08/22/12 after starting Baclofen for pain related to chronic trigeminal neuralgia. She has been on carbemazepine for neuropathic pain for several years.  Resultant hyponatremia related to chronic Tegretol use has contributed to prior hospitalizations for confusion.  Patient has refused to stop Tegretol as other agents have not been effective in treating her pain.   -  anemia hgb 6.6 -Hyponatremia Na 122  Plan: 1. Would delay medication adjustment/addition for trigeminal neuralgia until mental status clears.  Recommed outpatient follow-up with neurology.  Has not seen neurologist locally and needs help facilitating follow-up. 2. Primary team treating anemia and low Na.  Discussed with Dr. Roseanne Reno who agrees with assessment and recommendations.Delice Bison Jernejcic PA-C [facility] [phone] 08/24/2012, 12:23 PM

## 2012-08-24 NOTE — Progress Notes (Signed)
ANTICOAGULATION CONSULT NOTE - Initial Consult  Pharmacy Consult for Coumadin Indication: atrial fibrillation  Allergies  Allergen Reactions  . Penicillins Hives    Patient Measurements: Height: 5\' 3"  (160 cm) Weight: 126 lb 1.6 oz (57.199 kg) IBW/kg (Calculated) : 52.4   Vital Signs: Temp: 97.8 F (36.6 C) (09/21 0600) Temp src: Oral (09/21 0600) BP: 113/67 mmHg (09/21 0600) Pulse Rate: 73  (09/21 0600)  Labs:  Alvira Philips 08/24/12 0536 08/24/12 0535 08/24/12 0120 08/23/12 2032 08/23/12 1500  HGB -- 6.6* -- -- 7.3*  HCT -- 20.1* -- -- 22.2*  PLT -- 247 -- -- 242  APTT -- -- -- -- --  LABPROT 27.7* -- -- -- 30.2*  INR 2.75* -- -- -- 3.09*  HEPARINUNFRC -- -- -- -- --  CREATININE -- 1.29* -- -- 1.21*  CKTOTAL -- -- -- -- --  CKMB -- -- -- -- --  TROPONINI -- -- 0.40* 0.46* --    Estimated Creatinine Clearance: 28.8 ml/min (by C-G formula based on Cr of 1.29).   Medical History: Past Medical History  Diagnosis Date  . COPD (chronic obstructive pulmonary disease)     Never smoked. Questionable diagnosis.  . Atrial fibrillation   . Osteopenia   . Insomnia   . Hyponatremia   . Hypertension   . UTI (lower urinary tract infection)   . Bursitis of hip   . Trigeminal neuralgia     Medications:  Scheduled:     . aspirin EC  81 mg Oral Daily  . carbamazepine  100 mg Oral BID  . carvedilol  6.25 mg Oral BID WC  . losartan  50 mg Oral Daily  . ondansetron (ZOFRAN) IV  4 mg Intravenous Once  . sodium chloride  3 mL Intravenous Q12H  . tiotropium  18 mcg Inhalation Daily  . warfarin  4 mg Oral Once  . Warfarin - Pharmacist Dosing Inpatient   Does not apply q1800  . DISCONTD: carbamazepine  100 mg Oral BID  . DISCONTD: losartan  50 mg Oral Daily   Infusions:     . sodium chloride 75 mL/hr at 08/23/12 2032   PRN: acetaminophen, acetaminophen, ondansetron (ZOFRAN) IV, ondansetron  Assessment: Angela Franco is a 78 year old female admitted with AMS and on chronic  coumadin for atrial fibrillation to be continued in hospital. In the nursing home, patient takes 7.5mg  on all days except Tuesday and Friday where she takes 5mg . INR yesterday was slightly supratherapeutic but back wnl at 2.75<3.09 this morning. Noted that patient's Hgb dropped to 6.6<7.3 this morning. Talked to nurse> doesn't know if pt is bleeding or not. Waiting for MD to see pt.  Goal of Therapy:  INR 2-3 Monitor platelets by anticoagulation protocol: Yes   Plan:  Will hold Coumadin for now until bleed is ruled out.  Will f/u   Thank you, Franchot Erichsen, Pharm.D. Clinical Pharmacist   Pager: [phone] 08/24/2012 9:13 AM

## 2012-08-24 NOTE — Progress Notes (Addendum)
[facility] PROGRESS NOTE  Angela Franco ZOX:[id number] DOB: July 10, 1929 DOA: 08/23/2012 PCP: No primary provider on file.  Assessment/Plan: Acute encephalopathy -Likely multifactorial secondary to acute drop in hemoglobin, baclofen use, hyponatremia (has been chronic) -Check B12, folate, urine drug screen -MRI brain -TSH, RPR, NH3, Tegretol level unremarkable -Urinalysis negative for UTI -Neurology consult at per family request Hyponatremia -This has been chronic, partly due to Tegretol -Serum sodium ranges 122-128 -Awaiting urine sodium, urine osmolarity, urine creatinine -Patient appears euvolemic -Patient has been on Samsca the past with mild improvement in sodium Anemia -Check LDH, haptoglobin -Hemoccult is negative, will repeat x1 -Patient is on warfarin, but no obvious source of bleeding at this time -Check iron studies in addition to B12 and folate -Transfuse 2 units packed red blood cells Trigeminal neuralgia -Patient has been on carbamazepine for 20 years, reluctant to change -Consider adding gabapentin or Trileptal -Neurology has been consulted per family request CKD stage III  -Serum creatinine appears at baseline  Chronic atrial fibrillation  -Rate controlled at this time  -Continue warfarin, pharmacy assist in dose  Elevated troponins  -Nonspecific  -Do not believe the patient is having an acute coronary syndrome  -Likely secondary to LVH, and ischemia, CKD  Chronic CHF, systolic  -We'll compensated at this time without fluid overload     Family Communication:   Updated daughter at bedside  Disposition Plan:   Back to assisted living  when medically stable     Procedures/Studies: Dg Chest 2 View  08/23/2012  *RADIOLOGY REPORT*  Clinical Data: Atrial fibrillation.  CHEST - 2 VIEW  Comparison: 06/29/2012.  Findings: Stable enlarged cardiac silhouette and linear scarring at the left lung base.  Stable hyperexpansion of the lungs with mild diffuse  peribronchial thickening and accentuation of the interstitial markings.  Resolved pleural fluid on the right. Stable bilateral nipple shadows.  Diffuse osteopenia.  Thoracic spine degenerative changes.  IMPRESSION:  1.  No acute abnormality. 2.  Stable cardiomegaly and changes of COPD and chronic bronchitis.   Original Report Authenticated By: Darrol Angel, M.D.    Ct Head Wo Contrast  08/23/2012  *RADIOLOGY REPORT*  Clinical Data:  confusion, altered mental status  CT HEAD WITHOUT CONTRAST  Technique:  Contiguous axial images were obtained from the base of the skull through the vertex without contrast.  Comparison: None.  Findings: No skull fracture is noted.  Paranasal sinuses and mastoid air cells are unremarkable.  Mild cerebral atrophy.  No intracranial hemorrhage, mass effect or midline shift.  No acute infarction.  No mass lesion is noted on this unenhanced scan.  Periventricular and patchy subcortical white matter decreased attenuation probable due to chronic small vessel ischemic changes.  IMPRESSION: No acute intracranial abnormality.  Mild cerebral atrophy. Periventricular and patchy subcortical white matter decreased attenuation probable due to chronic small vessel ischemic changes.   Original Report Authenticated By: Natasha Mead, M.D.          Subjective: Patient remains pleasantly confused but less so than yesterday. Denies headache, visual changes, dizziness, chest pain, shortness breath, nausea, vomiting, diarrhea, abdominal pain. No fevers or chills. Daughter is at the bedside.   Objective: Filed Vitals:   08/24/12 0400 08/24/12 0600 08/24/12 0915 08/24/12 0930  BP: 133/64 113/67 97/60 121/67  Pulse:  73 104 88  Temp:  97.8 F (36.6 C) 98 F (36.7 C) 98.2 F (36.8 C)  TempSrc:  Oral Oral Oral  Resp:  16 18 17   Height:  Weight:      SpO2:  91%      Intake/Output Summary (Last 24 hours) at 08/24/12 1020 Last data filed at 08/24/12 0930  Gross per 24 hour  Intake     353 ml  Output    100 ml  Net    253 ml   Weight change:  Exam:   General:  Pt is alert, follows commands appropriately, not in acute distress  HEENT: No icterus, No thrush,  [place]/AT  Cardiovascular: IRRR, no rubs, no gallops  Respiratory: Clear to auscultation bilaterally, no wheezing, no crackles, no rhonchi  Abdomen: Soft, non tender, non distended, bowel sounds present, no guarding  Extremities: trace edema, No lymphangitis, No petechiae, No rashes, no synovitis  Data Reviewed: Basic Metabolic Panel:  Lab 08/24/12 1610 08/23/12 1500  NA 122* 124*  K 3.5 3.9  CL 86* 86*  CO2 27 27  GLUCOSE 95 118*  BUN 27* 30*  CREATININE 1.29* 1.21*  CALCIUM 9.1 9.6  MG -- --  PHOS -- --   Liver Function Tests:  Lab 08/23/12 1500  AST 30  ALT 24  ALKPHOS 116  BILITOT 0.5  PROT 8.0  ALBUMIN 3.2*    Lab 08/23/12 2030  LIPASE 41  AMYLASE --    Lab 08/23/12 2125  AMMONIA 20   CBC:  Lab 08/24/12 0535 08/23/12 1500  WBC 4.9 6.6  NEUTROABS -- 4.4  HGB 6.6* 7.3*  HCT 20.1* 22.2*  MCV 86.3 86.0  PLT 247 242   Cardiac Enzymes:  Lab 08/24/12 0120 08/23/12 2032  CKTOTAL -- --  CKMB -- --  CKMBINDEX -- --  TROPONINI 0.40* 0.46*   BNP: No components found with this basename: POCBNP:5 CBG: No results found for this basename: GLUCAP:5 in the last 168 hours  No results found for this or any previous visit (from the past 240 hour(s)).   Scheduled Meds:   . aspirin EC  81 mg Oral Daily  . carbamazepine  100 mg Oral BID  . carvedilol  6.25 mg Oral BID WC  . losartan  50 mg Oral Daily  . ondansetron (ZOFRAN) IV  4 mg Intravenous Once  . sodium chloride  3 mL Intravenous Q12H  . tiotropium  18 mcg Inhalation Daily  . warfarin  4 mg Oral Once  . Warfarin - Pharmacist Dosing Inpatient   Does not apply q1800  . DISCONTD: carbamazepine  100 mg Oral BID  . DISCONTD: losartan  50 mg Oral Daily   Total time:   Reagyn Facemire, DO  [facility] Pager  [phone]  If 7PM-7AM, please contact night-coverage [email or web address] Password [email or web address] 08/24/2012, 10:20 AM   LOS: 1 day

## 2012-08-24 NOTE — Progress Notes (Signed)
CRITICAL VALUE ALERT  Critical value received:  Troponin of 0.46  Date of notification:  08/23/2012  Time of notification:  2141  Critical value read back:yes  Nurse who received alert:  Sanda Linger  MD notified (1st page):  Everett Graff, NP  Time of first page:  2145  MD notified (2nd page):  Time of second page:  Responding MD:  Everett Graff, NP  Time MD responded:  2147  Pt was ordered a STAT ekg. NP stated he would come up once i called him with the EKG results. Will continue to monitor the pt. Sanda Linger

## 2012-08-24 NOTE — Progress Notes (Signed)
CRITICAL VALUE ALERT  Critical value received: Hemoglobin 6.6  Date of notification:  08/24/2012  Time of notification:  0624  Critical value read back:yes  Nurse who received alert:  Sanda Linger RN  MD notified (1st page):  Everett Graff NP  Time of first page:  [phone]  MD notified (2nd page):  Time of second page:  Responding MD:  Everett Graff NP  Time MD responded:  [phone]  New orders given for hemoccult, type and screen as well as transfusion of 1 unit PRBC. Will continue to monitor the pt. Sanda Linger

## 2012-08-24 NOTE — Consult Note (Signed)
Reason for Consult: abnormal troponins  Referring Physician: triad hospitalist    Angela Franco is an 78 y.o. female.    Chief Complaint:  confusion  HPI: 78 year old female presented with acute mental status change. The patient lives in an assisted-living-type environment. Apparently, the patient was started on baclofen on Wednesday which was a new medication. The patient's daughter noted worsening confusion starting on Thursday with decreased oral intake. In addition, the patient has had dry heaves for the last 24 hours. The patient is confused at this time. Original history with admitting MD was obtained from speaking with the patient's son at the bedside and from the ER physician.   The patient's son stated that the patient has been in her usual state of health even one week ago and today her mentation is definite decline. The patient has had numerous admissions for hyponatremia in the past. Her sodium is 124 today which is near her baseline. Serum creatinine was 1.21 which is at her baseline. However it was noted that the patient's hemoglobin was 7.3 which is lower than her usual baseline of 9.5-10.5. There's been no history of epistaxis, hemoptysis, hematemesis, hematuria, or hematochezia or melena. It is unclear whether the patient had a recent endoscopy. Hemoccult emergency department was negative.  History of acute on chronic systolic and diastolic CHF, in July of this year. Initially  EF was 30-35% but on repeat Echo is 40-45%. Cardiac cath in July with patent coronary arteries.  She also had Troponin elevation at that time ( June 29, 2012)  to 0.63.  She has had an MRI that is negative for cardiac amyloidosis.  It did reveal large pericardial effusion.  2 D echo was repeated and she did have a moderate Rt sided pl. Effusion but no pericardial effusion.  The EF had improved to 40-45%.   History of hyponatremia and could not afford Samsca which did bring up her sodium to 129.  She is in  chronic A. Fib with anticoagulation with coumadin. Chronic LBBB.  Troponin I this admit 0.46 and 0.40.   Past Medical History  Diagnosis Date  . COPD (chronic obstructive pulmonary disease)     Never smoked. Questionable diagnosis.  . Atrial fibrillation   . Osteopenia   . Insomnia   . Hyponatremia   . Hypertension   . UTI (lower urinary tract infection)   . Bursitis of hip   . Trigeminal neuralgia   . Altered mental status 08/24/2012  . Anemia, with significant drop in H/H 08/24/2012    Past Surgical History  Procedure Date  . Appendectomy   . Tonsillectomy and adenoidectomy     Family History  Problem Relation Age of Onset  . Heart attack Brother   . Heart attack Father    Social History:  reports that she has never smoked. She does not have any smokeless tobacco history on file. She reports that she does not drink alcohol. Her drug history not on file.  Lives in assisted living and Doctors making house calls follow her and her coumadin.    Allergies:  Allergies  Allergen Reactions  . Penicillins Hives    Medications Prior to Admission  Medication Sig Dispense Refill  . aspirin EC 81 MG EC tablet Take 1 tablet (81 mg total) by mouth daily.      . baclofen (LIORESAL) 10 MG tablet Take 10 mg by mouth every 8 (eight) hours as needed. For neuralgia pain      . Calcium Carb-Cholecalciferol (CALCIUM-VITAMIN D)  500-400 MG-UNIT TABS Take 2 tablets by mouth daily.      . carbamazepine (CARBATROL) 100 MG 12 hr capsule Take 100 mg by mouth 2 (two) times daily.      . carvedilol (COREG) 6.25 MG tablet Take 1 tablet (6.25 mg total) by mouth 2 (two) times daily with a meal.  60 tablet  0  . furosemide (LASIX) 80 MG tablet Take 1 tablet (80 mg total) by mouth daily.  30 tablet  0  . losartan (COZAAR) 50 MG tablet Take 1 tablet (50 mg total) by mouth daily.  30 tablet  0  . potassium chloride SA (K-DUR,KLOR-CON) 20 MEQ tablet Take 1 tablet (20 mEq total) by mouth 2 (two) times daily.   60 tablet  0  . temazepam (RESTORIL) 15 MG capsule Take 15 mg by mouth at bedtime as needed.      . tiotropium (SPIRIVA) 18 MCG inhalation capsule Place 18 mcg into inhaler and inhale daily.      [name] warfarin (COUMADIN) 5 MG tablet Take 5-7.5 mg by mouth daily. 1 tablet on Tuesday and Friday 1.5 tablet all other days        Results for orders placed during the hospital encounter of 08/23/12 (from the past 48 hour(s))  URINALYSIS, ROUTINE W REFLEX MICROSCOPIC     Status: Abnormal   Collection Time   08/23/12  2:49 PM      Component Value Range Comment   Color, Urine YELLOW  YELLOW    APPearance CLEAR  CLEAR    Specific Gravity, Urine 1.007  1.005 - 1.030    pH 8.0  5.0 - 8.0    Glucose, UA NEGATIVE  NEGATIVE mg/dL    Hgb urine dipstick NEGATIVE  NEGATIVE    Bilirubin Urine NEGATIVE  NEGATIVE    Ketones, ur NEGATIVE  NEGATIVE mg/dL    Protein, ur NEGATIVE  NEGATIVE mg/dL    Urobilinogen, UA 0.2  0.0 - 1.0 mg/dL    Nitrite NEGATIVE  NEGATIVE    Leukocytes, UA TRACE (*) NEGATIVE   URINE MICROSCOPIC-ADD ON     Status: Normal   Collection Time   08/23/12  2:49 PM      Component Value Range Comment   Squamous Epithelial / LPF RARE  RARE    WBC, UA 0-2  <3 WBC/hpf    Urine-Other MUCOUS PRESENT     CBC WITH DIFFERENTIAL     Status: Abnormal   Collection Time   08/23/12  3:00 PM      Component Value Range Comment   WBC 6.6  4.0 - 10.5 K/uL    RBC 2.58 (*) 3.87 - 5.11 MIL/uL    Hemoglobin 7.3 (*) 12.0 - 15.0 g/dL    HCT 40.9 (*) 81.1 - 46.0 %    MCV 86.0  78.0 - 100.0 fL    MCH 28.3  26.0 - 34.0 pg    MCHC 32.9  30.0 - 36.0 g/dL    RDW 91.4 (*) 78.2 - 15.5 %    Platelets 242  150 - 400 K/uL    Neutrophils Relative 67  43 - 77 %    Neutro Abs 4.4  1.7 - 7.7 K/uL    Lymphocytes Relative 19  12 - 46 %    Lymphs Abs 1.2  0.7 - 4.0 K/uL    Monocytes Relative 13 (*) 3 - 12 %    Monocytes Absolute 0.9  0.1 - 1.0 K/uL    Eosinophils Relative 1  0 - 5 %    Eosinophils Absolute 0.1  0.0 -  0.7 K/uL    Basophils Relative 0  0 - 1 %    Basophils Absolute 0.0  0.0 - 0.1 K/uL   COMPREHENSIVE METABOLIC PANEL     Status: Abnormal   Collection Time   08/23/12  3:00 PM      Component Value Range Comment   Sodium 124 (*) 135 - 145 mEq/L    Potassium 3.9  3.5 - 5.1 mEq/L    Chloride 86 (*) 96 - 112 mEq/L    CO2 27  19 - 32 mEq/L    Glucose, Bld 118 (*) 70 - 99 mg/dL    BUN 30 (*) 6 - 23 mg/dL    Creatinine, Ser 9.56 (*) 0.50 - 1.10 mg/dL    Calcium 9.6  8.4 - 21.3 mg/dL   PROTIME-INR     Status: Abnormal   Collection Time   08/23/12  3:00 PM      Component Value Range Comment   Prothrombin Time 30.2 (*) 11.6 - 15.2 seconds    INR 3.09 (*) 0.00 - 1.49   OCCULT BLOOD, POC DEVICE     Status: Normal   Collection Time   08/23/12  4:38 PM      Component Value Range Comment   Fecal Occult Bld NEGATIVE     OSMOLALITY     Status: Abnormal   Collection Time   08/23/12  8:30 PM      Component Value Range Comment   Osmolality 268 (*) 275 - 300 mOsm/kg   TSH     Status: Normal   Collection Time   08/23/12  8:30 PM      Component Value Range Comment   TSH 3.043  0.350 - 4.500 uIU/mL   RPR     Status: Normal   Collection Time   08/23/12  8:30 PM      Component Value Range Comment   RPR NON REACTIVE  NON REACTIVE   CARBAMAZEPINE LEVEL, TOTAL     Status: Normal   Collection Time   08/23/12  8:30 PM      Component Value Range Comment   Carbamazepine Lvl 4.2  4.0 - 12.0 ug/mL   TROPONIN I     Status: Abnormal   Collection Time   08/23/12  8:32 PM      Component Value Range Comment   Troponin I 0.46 (*) <0.30 ng/mL   AMMONIA     Status: Normal   Collection Time   08/23/12  9:25 PM      Component Value Range Comment   Ammonia 20  11 - 60 umol/L   TROPONIN I     Status: Abnormal   Collection Time   08/24/12  1:20 AM      Component Value Range Comment   Troponin I 0.40 (*) <0.30 ng/mL   BASIC METABOLIC PANEL     Status: Abnormal   Collection Time   08/24/12  5:35 AM      Component  Value Range Comment   Sodium 122 (*) 135 - 145 mEq/L    Potassium 3.5  3.5 - 5.1 mEq/L    Chloride 86 (*) 96 - 112 mEq/L    CO2 27  19 - 32 mEq/L    Glucose, Bld 95  70 - 99 mg/dL    BUN 27 (*) 6 - 23 mg/dL    Creatinine, Ser 0.86 (*) 0.50 - 1.10 mg/dL  Calcium 9.1  8.4 - 10.5 mg/dL    GFR calc non Af Amer 38 (*) >90 mL/min    GFR calc Af Amer 44 (*) >90 mL/min   CBC     Status: Abnormal   Collection Time   08/24/12  5:35 AM      Component Value Range Comment   WBC 4.9  4.0 - 10.5 K/uL    RBC 2.33 (*) 3.87 - 5.11 MIL/uL    Hemoglobin 6.6 (*) 12.0 - 15.0 g/dL    HCT 21.3 (*) 08.6 - 46.0 %    MCV 86.3  78.0 - 100.0 fL    MCH 28.3  26.0 - 34.0 pg    MCHC 32.8  30.0 - 36.0 g/dL    RDW 57.8 (*) 46.9 - 15.5 %    Platelets 247  150 - 400 K/uL   LIPID PANEL     Status: Normal   Collection Time   08/24/12  5:35 AM      Component Value Range Comment   Cholesterol 157  0 - 200 mg/dL    Triglycerides 51  <629 mg/dL    HDL 51  >52 mg/dL    Total CHOL/HDL Ratio 3.1      VLDL 10  0 - 40 mg/dL    LDL Cholesterol 96  0 - 99 mg/dL   PROTIME-INR     Status: Abnormal   Collection Time   08/24/12  5:36 AM      Component Value Range Comment   Prothrombin Time 27.7 (*) 11.6 - 15.2 seconds    INR 2.75 (*) 0.00 - 1.49   TYPE AND SCREEN     Status: Normal (Preliminary result)   Collection Time   08/24/12  7:05 AM      Component Value Range Comment   ABO/RH(D) A POS      Antibody Screen NEG     PREPARE RBC (CROSSMATCH)     Status: Normal  ABO/RH     Status: Normal  URINE RAPID DRUG SCREEN (HOSP PERFORMED)     Status: Abnormal   Collection Time   08/24/12 11:19 AM      Component Value Range Comment   Opiates POSITIVE (*) NONE DETECTED    Cocaine NONE DETECTED  NONE DETECTED    Benzodiazepines NONE DETECTED  NONE DETECTED    Amphetamines NONE DETECTED  NONE DETECTED    Tetrahydrocannabinol NONE DETECTED  NONE DETECTED    Barbiturates NONE DETECTED  NONE DETECTED    Dg Chest 2  View  08/23/2012  *RADIOLOGY REPORT*  Clinical Data: Atrial fibrillation.  CHEST - 2 VIEW  Comparison: 06/29/2012.  Findings: Stable enlarged cardiac silhouette and linear scarring at the left lung base.  Stable hyperexpansion of the lungs with mild diffuse peribronchial thickening and accentuation of the interstitial markings.  Resolved pleural fluid on the right. Stable bilateral nipple shadows.  Diffuse osteopenia.  Thoracic spine degenerative changes.  IMPRESSION:  1.  No acute abnormality. 2.  Stable cardiomegaly and changes of COPD and chronic bronchitis.   Original Report Authenticated By: Darrol Angel, M.D.    Ct Head Wo Contrast  08/23/2012  *RADIOLOGY REPORT*  Clinical Data:  confusion, altered mental status  CT HEAD WITHOUT CONTRAST  Technique:  Contiguous axial images were obtained from the base of the skull through the vertex without contrast.  Comparison: None.  Findings: No skull fracture is noted.  Paranasal sinuses and mastoid air cells are unremarkable.  Mild cerebral atrophy.  No intracranial hemorrhage, mass effect or midline shift.  No acute infarction.  No mass lesion is noted on this unenhanced scan.  Periventricular and patchy subcortical white matter decreased attenuation probable due to chronic small vessel ischemic changes.  IMPRESSION: No acute intracranial abnormality.  Mild cerebral atrophy. Periventricular and patchy subcortical white matter decreased attenuation probable due to chronic small vessel ischemic changes.   Original Report Authenticated By: Natasha Mead, M.D.    ROS: General:no colds or fevers, was feeling well until Thursday Skin:no rashes or ulcers HEENT:no blurred vision CV:no chest pain PUL:no SOB GI:no diarrhea constipation, no melena GU:no hematuria MS:no joint pain Neuro:+ confusion, no syncope Endo:no diabetes   Blood pressure 138/66, pulse 104, temperature 97.7 F (36.5 C), temperature source Oral, resp. rate 20, height 5\' 3"  (1.6 m), weight 57.199  kg (126 lb 1.6 oz), SpO2 94.00%. PE: General:alert, pleasant affect, oriented to person but makes statements that are not related to current discussion Skin:W&D, brisk capillary refill HEENT:normocephalic, sclera clear,  Neck:supple, no JVD, no carotid bruits Heart:S1S2 irreg, irreg; no M/R/G Lungs:clear, without rales and rhonchi or wheezes  Abd:+ BS, soft, non tender  Ext:no edema; bilateral feet with spider veins & "bluish hue" Neuro:alert and oriented to person, pleasant, follows commands; but confabulates when answering ?s.  Coumadin goes well with her other sweeteners.   Assessment/Plan Principal Problem:  *Altered mental status Active Problems:  Atrial fibrillation, chronic  Moderate to severe pulmonary hypertension, PA pressure in 70s June 2013  Non-ischemic cardiomyopathy EF improved from 30-35% to 40-45% by Echo in 07/2012  LBBB (left bundle branch block)  Chronic anticoagulation  Anemia, with significant drop in H/H  PLAN: normal coronaries by cath in July 2013.  NICM had improved with EF 40-45% by echo in our office.  Negative cardiac amyloidosis by MRI.    Probable elevated troponin secondary to anemia, with demand ischemia. She is being transfused 2 units PRBC. Monitor for CHF.  Neurology has also been consulted.  INGOLD,LAURA R 08/24/2012, 12:24 PM  I seen and evaluated the patient along with Nada Boozer, NP. I agree with her findings, examination and initial recommendations.  A very pleasant 78 year old woman with chronic atrial fibrillation and relatively normal coronary anatomy on cardiac catheterization performed in late July/early August of this year for decreased ejection fraction of 30-35%. However this was increased to 40-45% on a more recent echocardiogram as noted in Dr. Blanchie Dessert clinic note. Initial thoughts of TEE guided cardioversion were abandoned as she is likely been in chronic atrial fibrillation with a dilator the patient and difficult to keep in sinus  rhythm.  She has been on warfarin therapy for atrial fibrillation for stroke prophylaxis.  She was admitted for altered middle status and confusion and confabulation. She was found to be profoundly anemic with a hemoglobin of 6.6 this morning. She is currently receiving 2 units packed red blood cells.   Her current INR is 2.75, and as I would be reluctant to restart her warfarin therapy anytime soon until a potential GI bleed is found, we'll go ahead and reverse with vitamin K. Could also hold aspirin for now.  Would monitor closely for signs of heart failure exacerbation following PRBC infusion; may require when necessary Lasix. Currently not having any symptoms.  Troponin is mildly elevated in the setting of Atrial Fibrillation with Rapid Ventricular Rate given likely due to anemia. With her relatively normal coronary anatomy just several months ago this is unlikely to be due to an acute coronary  syndrome however is more likely to be due to myocardial necrosis secondary to Type II MI (supply versus demand ischemia with increased supply with tachycardia and decreased demand due to anemia).    Would continue carvedilol for beta blocker based on her low EF. If additional heart rate control is a warranted, would potentially hold the ARB in order to increase the beta blocker dose.  She has chronic hyponatremia, thought to be due to long-term Tegretol used for trigeminal neuralgia, and has previously been started on Samsca (Tolvaptan)with sodium checked in the outpatient setting of 129 . This was not continued as an outpatient due to prohibitive and cost.    Her sodium level is back down to 122, question if this could be contributing somewhat to her altered mental status. We'll strongly consider reinitiation of Tolvaptan .  Marykay Lex, M.D., M.S. [facility] [address] [place], [place]  [address]  [phone] Pager # [phone]  08/24/2012 1:29  PM

## 2012-08-24 NOTE — Progress Notes (Signed)
INITIAL ADULT NUTRITION ASSESSMENT Date: 08/24/2012   Time: 11:16 AM  INTERVENTION:  Ensure Complete twice daily between meals (350 kcals, 13 gm protein per 8 fl oz bottle) RD to follow for nutrition care plan  Reason for Assessment: Malnutrition Screening Tool Report  ASSESSMENT: Female 78 y.o.  Dx: acute encephalopathy  Hx:  Past Medical History  Diagnosis Date  . COPD (chronic obstructive pulmonary disease)     Never smoked. Questionable diagnosis.  . Atrial fibrillation   . Osteopenia   . Insomnia   . Hyponatremia   . Hypertension   . UTI (lower urinary tract infection)   . Bursitis of hip   . Trigeminal neuralgia     Related Meds:  Scheduled Meds:   . aspirin EC  81 mg Oral Daily  . carbamazepine  100 mg Oral BID  . carvedilol  6.25 mg Oral BID WC  . losartan  50 mg Oral Daily  . ondansetron (ZOFRAN) IV  4 mg Intravenous Once  . sodium chloride  3 mL Intravenous Q12H  . tiotropium  18 mcg Inhalation Daily  . warfarin  4 mg Oral Once  . Warfarin - Pharmacist Dosing Inpatient   Does not apply q1800  . DISCONTD: carbamazepine  100 mg Oral BID  . DISCONTD: losartan  50 mg Oral Daily   Continuous Infusions:   . DISCONTD: sodium chloride 75 mL/hr at 08/23/12 2032   PRN Meds:.acetaminophen, acetaminophen, ondansetron (ZOFRAN) IV, ondansetron   Ht: 5\' 3"  (160 cm)  Wt: 126 lb 1.6 oz (57.199 kg)  Ideal Wt: 52.2 kg % Ideal Wt: 91%  Usual Wt: 132 lb % Usual Wt: 95%  Body mass index is 22.34 kg/(m^2).  Food/Nutrition Related Hx: recent weight lost without trying and decreased appetite per admission nutrition screen  Labs:  CMP     Component Value Date/Time   NA 122* 08/24/2012 0535   K 3.5 08/24/2012 0535   CL 86* 08/24/2012 0535   CO2 27 08/24/2012 0535   GLUCOSE 95 08/24/2012 0535   BUN 27* 08/24/2012 0535   CREATININE 1.29* 08/24/2012 0535   CALCIUM 9.1 08/24/2012 0535   PROT 8.0 08/23/2012 1500   ALBUMIN 3.2* 08/23/2012 1500   AST 30 08/23/2012 1500   ALT 24 08/23/2012 1500   ALKPHOS 116 08/23/2012 1500   BILITOT 0.5 08/23/2012 1500   GFRNONAA 38* 08/24/2012 0535   GFRAA 44* 08/24/2012 0535     Intake/Output Summary (Last 24 hours) at 08/24/12 1117 Last data filed at 08/24/12 0930  Gross per 24 hour  Intake    353 ml  Output    100 ml  Net    253 ml    Diet Order: Cardiac  Supplements/Tube Feeding: N/A  IVF:    DISCONTD: sodium chloride Last Rate: 75 mL/hr at 08/23/12 2032    Estimated Nutritional Needs:   Kcal: 1400-1600 Protein: 65-75 gm Fluid: > 1.5 L  Patient presented with acute mental status change; RD spoke with patient's daughter who reports patient's PO intake has been progressively declining over the past couple months; has lost approximately 6 lb since January (4%) -- not significant for time frame; patient at nutrition risk given poor PO intake, unintentional weight loss; drinks nutrition supplements at home; will order Ensure Complete.  NUTRITION DIAGNOSIS: -Inadequate oral intake (NI-2.1).  Status: Ongoing  RELATED TO: poor appetite  AS EVIDENCE BY: family report  MONITORING/EVALUATION(Goals): Goal: Oral intake with meals & supplements to meet >/= 90% of estimated nutrition needs Monitor:  PO & supplemental intake, weight, labs, I/O's  EDUCATION NEEDS: -No education needs identified at this time  Dietitian #: [phone]  DOCUMENTATION CODES Per approved criteria  -Not Applicable    Alger Memos 08/24/2012, 11:16 AM

## 2012-08-25 ENCOUNTER — Inpatient Hospital Stay (HOSPITAL_COMMUNITY): Payer: Medicare Other

## 2012-08-25 LAB — BLOOD GAS, ARTERIAL
Acid-Base Excess: 2.2 mmol/L — ABNORMAL HIGH (ref 0.0–2.0)
Drawn by: 105521
FIO2: 0.21 %
O2 Saturation: 95.2 %
Patient temperature: 98.6

## 2012-08-25 LAB — PROTIME-INR
INR: 1.82 — ABNORMAL HIGH (ref 0.00–1.49)
Prothrombin Time: 20.4 seconds — ABNORMAL HIGH (ref 11.6–15.2)

## 2012-08-25 LAB — BASIC METABOLIC PANEL
CO2: 26 mEq/L (ref 19–32)
Calcium: 9.1 mg/dL (ref 8.4–10.5)
GFR calc non Af Amer: 38 mL/min — ABNORMAL LOW (ref >90)
Glucose, Bld: 95 mg/dL (ref 70–99)
Potassium: 3.9 mEq/L (ref 3.5–5.1)
Sodium: 123 mEq/L — ABNORMAL LOW (ref 135–145)

## 2012-08-25 LAB — CBC
Hemoglobin: 7.6 g/dL — ABNORMAL LOW (ref 12.0–15.0)
MCH: 28.4 pg (ref 26.0–34.0)
MCHC: 33.6 g/dL (ref 30.0–36.0)
Platelets: 179 10*3/uL (ref 150–400)
RBC: 2.68 MIL/uL — ABNORMAL LOW (ref 3.87–5.11)

## 2012-08-25 LAB — PREPARE RBC (CROSSMATCH)

## 2012-08-25 MED ORDER — FUROSEMIDE 10 MG/ML IJ SOLN
INTRAMUSCULAR | Status: AC
  Filled 2012-08-25: qty 4

## 2012-08-25 MED ORDER — FERROUS SULFATE 325 (65 FE) MG PO TABS
325.0000 mg | ORAL_TABLET | Freq: Three times a day (TID) | ORAL | Status: DC
  Administered 2012-08-25 – 2012-08-26 (×4): 325 mg via ORAL
  Filled 2012-08-25 (×5): qty 1

## 2012-08-25 MED ORDER — WARFARIN SODIUM 7.5 MG PO TABS
7.5000 mg | ORAL_TABLET | Freq: Once | ORAL | Status: DC
  Filled 2012-08-25: qty 1

## 2012-08-25 MED ORDER — TEMAZEPAM 15 MG PO CAPS
15.0000 mg | ORAL_CAPSULE | Freq: Once | ORAL | Status: AC
  Administered 2012-08-25: 15 mg via ORAL
  Filled 2012-08-25: qty 1

## 2012-08-25 MED ORDER — FUROSEMIDE 10 MG/ML IJ SOLN: 20.0000 mg | Freq: Once | INTRAMUSCULAR | Status: DC

## 2012-08-25 MED ORDER — SODIUM CHLORIDE 0.9 % IV SOLN: 125.0000 mg | Freq: Once | INTRAVENOUS | Status: DC

## 2012-08-25 MED ORDER — ACETAMINOPHEN 325 MG PO TABS
650.0000 mg | ORAL_TABLET | Freq: Once | ORAL | Status: AC
  Administered 2012-08-25: 650 mg via ORAL

## 2012-08-25 NOTE — Progress Notes (Signed)
Subjective: She says she feels fine.  No CP, SOB or palpitations.  Objective: Vital signs in last 24 hours: Temp:  [97.9 F (36.6 C)-98.4 F (36.9 C)] 98 F (36.7 C) (09/22 0600) Pulse Rate:  [81-111] 88  (09/22 0600) Resp:  [16-20] 20  (09/22 0600) BP: (115-131)/(67-103) 125/73 mmHg (09/22 0600) SpO2:  [93 %-97 %] 97 % (09/22 1048) Weight change:  Last BM Date: 08/23/12 Intake/Output from previous day: 09/21 0701 - 09/22 0700 In: 353 [I.V.:3; Blood:350] Out: 350 [Urine:350] Intake/Output this shift:    PE: General appearance: alert, cooperative, appears stated age, no distress, pale and slowed mentation Neck: no JVD, supple, symmetrical, trachea midline and thyroid not enlarged, symmetric, no tenderness/mass/nodules Lungs: clear to auscultation bilaterally and normal percussion bilaterally Heart: irregularly irregular rhythm, S1, S2 normal and no W/Franco/Franco Abdomen: soft, non-tender; bowel sounds normal; no masses,  no organomegaly Extremities: extremities normal, atraumatic, no cyanosis or edema Neurologic: Mental status: alertness: alert, orientation: person, place, knows year & day of month, but not month;very confused trying to figure out what is being asked & how to answer.  , thought content exhibits confabulations  Lab Results:  Basename 08/25/12 0645 08/24/12 2145 08/24/12 0535  WBC 4.7 -- 4.9  HGB 7.6* 8.2* --  HCT 22.6* 24.3* --  PLT 179 -- 247   BMET  Basename 08/25/12 0645 08/24/12 0535  NA 123* 122*  K 3.9 3.5  CL 87* 86*  CO2 26 27  GLUCOSE 95 95  BUN 29* 27*  CREATININE 1.30* 1.29*  CALCIUM 9.1 9.1    Basename 08/24/12 0120 08/23/12 2032  TROPONINI 0.40* 0.46*   Studies/Results: Reviewed MRI - no acute process,chronic changes.  Medications: I have reviewed the patient's current medications.    . carbamazepine  100 mg Oral BID  . carvedilol  6.25 mg Oral BID WC  . feeding supplement  237 mL Oral BID BM  . losartan  50 mg Oral Daily  .  phytonadione  5 mg Oral Once  . sodium chloride  3 mL Intravenous Q12H  . temazepam  15 mg Oral Once  . tiotropium  18 mcg Inhalation Daily  . warfarin  7.5 mg Oral ONCE-1800  . Warfarin - Pharmacist Dosing Inpatient   Does not apply q1800  . DISCONTD: aspirin EC  81 mg Oral Daily   Assessment/Plan: Principal Problem:  *Altered mental status Active Problems:  Atrial fibrillation, chronic  Moderate to severe pulmonary hypertension, PA pressure in 70s June 2013  Non-ischemic cardiomyopathy EF improved from 30-35% to 40-45% by Echo in 07/2012  LBBB (left bundle branch block)  Chronic anticoagulation  Anemia, with significant drop in H/H  PLAN: H/H has decreased.  Dr. Herbie Baltimore to see.  LOS: 2 days   Angela Franco,Angela Franco 08/25/2012, 12:10 PM  ATTENDING ATTESTATION: I have seen & examined the patient today along with Nada Boozer, NP.  I agree with her findings & exam.    Afib is rate controlled on current meds.  Has baseline LBBB.  With yet another drop in Hgb today, have d/c'd warfarin.  No plan to restart at this time until Hgb level stable.  Would consider additional transfusion & GI consultation as the majority of causes for Hgb drop is GI bleed (even though hemoccult negative)  Mild bump in troponin without angina in a patient with non-obstructive CAD on cath several months ago -- related to supply vs. Demand ischemia & not ACS.  AMS - not sure how much hyponatremia is contributing.  Na 123 today.  Consider dosing with Samsca (consult Nephrology if ?s)  No CHF findings, but will need to re-start some lasix if not quite home dose; continue ARB & BB.  Marykay Lex, M.D., M.S. [facility] [address] [place], [place]  [address]  [phone]  08/25/2012 12:34 PM

## 2012-08-25 NOTE — Progress Notes (Signed)
ANTICOAGULATION CONSULT NOTE - F/U Consult  Pharmacy Consult for Coumadin Indication: atrial fibrillation  Allergies  Allergen Reactions  . Penicillins Hives    Patient Measurements: Height: 5\' 3"  (160 cm) Weight: 126 lb 1.6 oz (57.199 kg) IBW/kg (Calculated) : 52.4   Vital Signs: Temp: 98 F (36.7 C) (09/22 0600) BP: 125/73 mmHg (09/22 0600) Pulse Rate: 88  (09/22 0600)  Labs:  Angela Franco 08/25/12 0645 08/24/12 2145 08/24/12 0536 08/24/12 0535 08/24/12 0120 08/23/12 2032 08/23/12 1500  HGB 7.6* 8.2* -- -- -- -- --  HCT 22.6* 24.3* -- 20.1* -- -- --  PLT 179 -- -- 247 -- -- 242  APTT -- -- -- -- -- -- --  LABPROT 20.4* -- 27.7* -- -- -- 30.2*  INR 1.82* -- 2.75* -- -- -- 3.09*  HEPARINUNFRC -- -- -- -- -- -- --  CREATININE -- -- -- 1.29* -- -- 1.21*  CKTOTAL -- -- -- -- -- -- --  CKMB -- -- -- -- -- -- --  TROPONINI -- -- -- -- 0.40* 0.46* --    Estimated Creatinine Clearance: 28.8 ml/min (by C-G formula based on Cr of 1.29).   Medical History: Past Medical History  Diagnosis Date  . COPD (chronic obstructive pulmonary disease)     Never smoked. Questionable diagnosis.  . Atrial fibrillation   . Osteopenia   . Insomnia   . Hyponatremia   . Hypertension   . UTI (lower urinary tract infection)   . Bursitis of hip   . Trigeminal neuralgia   . Altered mental status 08/24/2012  . Anemia, with significant drop in H/H 08/24/2012    Medications:  Scheduled:     . carbamazepine  100 mg Oral BID  . carvedilol  6.25 mg Oral BID WC  . feeding supplement  237 mL Oral BID BM  . losartan  50 mg Oral Daily  . phytonadione  5 mg Oral Once  . sodium chloride  3 mL Intravenous Q12H  . temazepam  15 mg Oral Once  . tiotropium  18 mcg Inhalation Daily  . Warfarin - Pharmacist Dosing Inpatient   Does not apply q1800  . DISCONTD: aspirin EC  81 mg Oral Daily   Infusions:     . DISCONTD: sodium chloride 75 mL/hr at 08/23/12 2032   PRN: acetaminophen, acetaminophen,  ondansetron (ZOFRAN) IV, ondansetron  Assessment: Ms Angela Franco is a 78 year old female admitted with AMS and on chronic coumadin for atrial fibrillation to be continued in hospital. In the nursing home, patient takes 7.5mg  on all days except Tuesday and Friday where she takes 5mg . INR now subtherapeutic at 1.82 this morning. Coumadin was held last night due to possible GI bleed and VitK 5mg  was also administered. Noted that patient's Hgb was 6.6 yesterday and 1 unit PRBC was administered. Hgb today is 7.6. Talked to nurse if a bleed was found since I did not find any documented>he was not sure. I told him I will put an order in for Coumadin tonight since pt's INR is subtherapeutic and leave a note for the MD to d/c if there was some bleeding found.  Goal of Therapy:  INR 2-3 Monitor platelets by anticoagulation protocol: Yes   Plan:  - Coumadin home dose of 7.5mg  po x 1  - INR in am  - Monitor for bleeding. Please d/c order if a bleed was noted  Thank you, [employer], Pharm.D. Clinical Pharmacist   Pager: [phone] 08/25/2012 7:49 AM

## 2012-08-25 NOTE — Progress Notes (Signed)
Pt started back on coumadin today. Pt  hgb was 8.2 yesterday post 1 unit blood transfusion. It is currently at 7.6 as of this AM .  Kindly address. Thanks! Ancil Linsey  RN

## 2012-08-25 NOTE — Consult Note (Signed)
Consult for Angela Franco  Reason for Consult:Anemia Referring Physician: [facility]  Angela Franco HPI: This is an 78 year old female with multiple medical problems who was admitted to the hospital for AMS.  The patient's mental status rapidly changed when she was started on Baclofen, per her son.  Since the admission she was noted to be hyponatremic, which is chronic, and anemic, with an HGB in the 7-9 range.  She denies any hematochezia, melena, or hematemesis.  At the time of the interview she was alert, oriented to time, person, and place and she denied having a prior EGD or Colonoscopy in the past.  In fact, she states that she is not interested in any procedures.  No reports of any abdominal pain, nausea, vomiting, constipation, or diarrhea.  The patient has a history of afib and her HGB did drop with anticoagulation.  Past Medical History  Diagnosis Date  . COPD (chronic obstructive pulmonary disease)     Never smoked. Questionable diagnosis.  . Atrial fibrillation   . Osteopenia   . Insomnia   . Hyponatremia   . Hypertension   . UTI (lower urinary tract infection)   . Bursitis of hip   . Trigeminal neuralgia   . Altered mental status 08/24/2012  . Anemia, with significant drop in H/H 08/24/2012    Past Surgical History  Procedure Date  . Appendectomy   . Tonsillectomy and adenoidectomy   . Cardiac catheterization     Family History  Problem Relation Age of Onset  . Heart attack Brother   . Heart attack Father     Social History:  reports that she has never smoked. She does not have any smokeless tobacco history on file. She reports that she does not drink alcohol. Her drug history not on file.  Allergies:  Allergies  Allergen Reactions  . Penicillins Hives    Medications:  Scheduled:   . acetaminophen  650 mg Oral Once  . carbamazepine  100 mg Oral BID  . carvedilol  6.25 mg Oral BID WC  . feeding supplement  237 mL Oral BID BM  . ferrous sulfate  325  mg Oral TID WC  . furosemide  20 mg Intravenous Once  . losartan  50 mg Oral Daily  . sodium chloride  3 mL Intravenous Q12H  . temazepam  15 mg Oral Once  . tiotropium  18 mcg Inhalation Daily  . DISCONTD: ferric gluconate (FERRLECIT/NULECIT) IV  125 mg Intravenous Once  . DISCONTD: warfarin  7.5 mg Oral ONCE-1800  . DISCONTD: Warfarin - Pharmacist Dosing Inpatient   Does not apply q1800   Continuous:   Results for orders placed during the hospital encounter of 08/23/12 (from the past 24 hour(s))  HEMOGLOBIN AND HEMATOCRIT, BLOOD     Status: Abnormal   Collection Time   08/24/12  9:45 PM      Component Value Range   Hemoglobin 8.2 (*) 12.0 - 15.0 g/dL   HCT 40.9 (*) 81.1 - 91.4 %  PROTIME-INR     Status: Abnormal   Collection Time   08/25/12  6:45 AM      Component Value Range   Prothrombin Time 20.4 (*) 11.6 - 15.2 seconds   INR 1.82 (*) 0.00 - 1.49  CBC     Status: Abnormal   Collection Time   08/25/12  6:45 AM      Component Value Range   WBC 4.7  4.0 - 10.5 K/uL   RBC 2.68 (*)  3.87 - 5.11 MIL/uL   Hemoglobin 7.6 (*) 12.0 - 15.0 g/dL   HCT 14.7 (*) 82.9 - 56.2 %   MCV 84.3  78.0 - 100.0 fL   MCH 28.4  26.0 - 34.0 pg   MCHC 33.6  30.0 - 36.0 g/dL   RDW 13.0 (*) 86.5 - 78.4 %   Platelets 179  150 - 400 K/uL  BASIC METABOLIC PANEL     Status: Abnormal   Collection Time   08/25/12  6:45 AM      Component Value Range   Sodium 123 (*) 135 - 145 mEq/L   Potassium 3.9  3.5 - 5.1 mEq/L   Chloride 87 (*) 96 - 112 mEq/L   CO2 26  19 - 32 mEq/L   Glucose, Bld 95  70 - 99 mg/dL   BUN 29 (*) 6 - 23 mg/dL   Creatinine, Ser 6.96 (*) 0.50 - 1.10 mg/dL   Calcium 9.1  8.4 - 29.5 mg/dL   GFR calc non Af Amer 38 (*) >90 mL/min   GFR calc Af Amer 44 (*) >90 mL/min  PREPARE RBC (CROSSMATCH)     Status: Normal   Collection Time   08/25/12  1:22 PM      Component Value Range   Order Confirmation ORDER PROCESSED BY BLOOD BANK    BLOOD GAS, ARTERIAL     Status: Abnormal   Collection  Time   08/25/12  2:30 PM      Component Value Range   FIO2 0.21     pH, Arterial 7.507 (*) 7.350 - 7.450   pCO2 arterial 32.0 (*) 35.0 - 45.0 mmHg   pO2, Arterial 74.2 (*) 80.0 - 100.0 mmHg   Bicarbonate 25.2 (*) 20.0 - 24.0 mEq/L   TCO2 26.2  0 - 100 mmol/L   Acid-Base Excess 2.2 (*) 0.0 - 2.0 mmol/L   O2 Saturation 95.2     Patient temperature 98.6     Collection site RIGHT RADIAL     Drawn by [id number]     Sample type ARTERIAL DRAW     Allens test (pass/fail) PASS  PASS     Mr Brain Wo Contrast  08/25/2012  *RADIOLOGY REPORT*  Clinical Data: Confusion.  Mental status changes.  MRI HEAD WITHOUT CONTRAST  Technique:  Multiplanar, multiecho pulse sequences of the brain and surrounding structures were obtained according to standard protocol without intravenous contrast.  Comparison: 08/23/2012 CT.  No comparison MR.  Findings: No acute infarct.  No intracranial hemorrhage.  Mild small vessel disease type changes.  No intracranial mass lesion detected on this unenhanced exam.  Global atrophy without hydrocephalus.  Major intracranial vascular structures are patent. Small right vertebral artery may end in a PICA distribution.  Minimal ethmoid sinus mucosal thickening.  IMPRESSION: No acute infarct.  Please see above.   Original Report Authenticated By: Fuller Canada, M.D.     ROS:  As stated above in the HPI otherwise negative.  Blood pressure 110/64, pulse 85, temperature 97.8 F (36.6 C), temperature source Oral, resp. rate 20, height 5\' 3"  (1.6 m), weight 57.199 kg (126 lb 1.6 oz), SpO2 95.00%.    PE: Gen: NAD, Alert and Oriented HEENT:  [place]/AT, EOMI Neck: Supple, no LAD Lungs: CTA Bilaterally CV: RRR without M/G/R ABM: Soft, NTND, +BS Ext: No C/C/E  Assessment/Plan: 1) Anemia with heme negative stools. 2) Chronic hyponatremia. 3) Afib.   I was able to speak with her son who reports that her AMS has  improved, but she is not completely back to baseline.  In fact, her mental  status will wax and wane.  She recently lost her husband two months ago and her son is not certain if they should pursue further work up.  His concern is quality of life issues and the family members do have POA.  At this time the siblings will discuss the issue with their mother and try to come to a consensus.  Plan: 1) Continue with the current care. 2) Await family decision about further work up endoscopically.  Greene Diodato D 08/25/2012, 5:00 PM

## 2012-08-25 NOTE — Progress Notes (Signed)
Pt HR noted to be 130-140's sustained.  BP 112/61 Pt denies any palpitations, CP, shortness of breath.    EKG obtained. Pt previously AFIB on monitor but EKG obtained states "sinus tach" with observation of regular rhythm on ekg and tele at this time.  Lenny Pastel NP on floor to review ekg and evaluate pt.  Pt received regular PM dose of coreg and ~45 mins after dose heart rate decreased to 112.  Orders received.  Will continue to monitor patient.

## 2012-08-25 NOTE — Progress Notes (Addendum)
[facility] PROGRESS NOTE  Kahmari Herard WJX:[id number] DOB: 1929-08-09 DOA: 08/23/2012 PCP: No primary provider on file.  Assessment/Plan: Acute encephalopathy  -Likely multifactorial secondary to acute drop in hemoglobin, baclofen use, ???hyponatremia (has been chronic)  -B12, urine drug screen negative -MRI brain negative for acute findings -TSH, RPR, NH3, Tegretol level unremarkable  -Urinalysis negative for UTI  -check ABG to rule out hypercarbia or hypoxemia -Appreciate Neurology Hyponatremia  -This has been chronic, partly due to Tegretol  -Serum sodium ranges 122-128 in past 9 months -Serum osmolarity/urine Osm does not truly suggests SIADH -Patient appears euvolemic  -Patient has been on Samsca the past with minimal improvement in sodium  -Would encourage patient to stop Tegretol and follow sodium before adding additional medications Anemia  -LDH, haptoglobin negative for hemolysis -Hemoccult is negative, will repeat x1  -Patient is on warfarin, but no obvious source of bleeding at this time  -Warfarin discontinued by cardiology -Iron studies suggest a degree of iron deficiency -Will give ferrous sulfate -hold on ferric gluconate due to iron load the patient will receive with additional 2units  packed red blood cells -Transfused 2 units packed red blood cells(9/20 and 9/22)--4 units total -check SPEP -consulted GI today Trigeminal neuralgia  -Patient has been on carbamazepine for 20 years, reluctant to change  -Consider adding gabapentin or Trileptal  -Neurology had been consulted per family request  CKD stage III  -Serum creatinine appears at baseline  Chronic atrial fibrillation  -Rate controlled at this time  -Warfarin discontinued by cardiology Elevated troponins  -Nonspecific  -Do not believe the patient is having an acute coronary syndrome  -Likely secondary to LVH, and ischemia, CKD  Chronic CHF, systolic  -Well compensated at this time without  fluid overload       Disposition Plan:   Home when medically stable        Procedures/Studies: Dg Chest 2 View  08/23/2012  *RADIOLOGY REPORT*  Clinical Data: Atrial fibrillation.  CHEST - 2 VIEW  Comparison: 06/29/2012.  Findings: Stable enlarged cardiac silhouette and linear scarring at the left lung base.  Stable hyperexpansion of the lungs with mild diffuse peribronchial thickening and accentuation of the interstitial markings.  Resolved pleural fluid on the right. Stable bilateral nipple shadows.  Diffuse osteopenia.  Thoracic spine degenerative changes.  IMPRESSION:  1.  No acute abnormality. 2.  Stable cardiomegaly and changes of COPD and chronic bronchitis.   Original Report Authenticated By: Darrol Angel, M.D.    Ct Head Wo Contrast  08/23/2012  *RADIOLOGY REPORT*  Clinical Data:  confusion, altered mental status  CT HEAD WITHOUT CONTRAST  Technique:  Contiguous axial images were obtained from the base of the skull through the vertex without contrast.  Comparison: None.  Findings: No skull fracture is noted.  Paranasal sinuses and mastoid air cells are unremarkable.  Mild cerebral atrophy.  No intracranial hemorrhage, mass effect or midline shift.  No acute infarction.  No mass lesion is noted on this unenhanced scan.  Periventricular and patchy subcortical white matter decreased attenuation probable due to chronic small vessel ischemic changes.  IMPRESSION: No acute intracranial abnormality.  Mild cerebral atrophy. Periventricular and patchy subcortical white matter decreased attenuation probable due to chronic small vessel ischemic changes.   Original Report Authenticated By: Natasha Mead, M.D.    Mr Brain Wo Contrast  08/25/2012  *RADIOLOGY REPORT*  Clinical Data: Confusion.  Mental status changes.  MRI HEAD WITHOUT CONTRAST  Technique:  Multiplanar, multiecho pulse sequences of the brain and  surrounding structures were obtained according to standard protocol without intravenous  contrast.  Comparison: 08/23/2012 CT.  No comparison MR.  Findings: No acute infarct.  No intracranial hemorrhage.  Mild small vessel disease type changes.  No intracranial mass lesion detected on this unenhanced exam.  Global atrophy without hydrocephalus.  Major intracranial vascular structures are patent. Small right vertebral artery may end in a PICA distribution.  Minimal ethmoid sinus mucosal thickening.  IMPRESSION: No acute infarct.  Please see above.   Original Report Authenticated By: Fuller Canada, M.D.          Subjective: Patient appears less confused today. She denies any chest pain, shortness of breath, dizziness, nausea, vomiting, diarrhea, abdominal pain, fevers, chills  Objective: Filed Vitals:   08/24/12 2045 08/24/12 2200 08/25/12 0600 08/25/12 1048  BP: 128/78 123/78 125/73   Pulse: 105 81 88   Temp:  98.4 F (36.9 C) 98 F (36.7 C)   TempSrc:      Resp: 20 16 20    Height:      Weight:      SpO2:  93% 93% 97%    Intake/Output Summary (Last 24 hours) at 08/25/12 1337 Last data filed at 08/25/12 0500  Gross per 24 hour  Intake      3 ml  Output    350 ml  Net   -347 ml   Weight change:  Exam:   General:  Pt is alert, follows commands appropriately, not in acute distress  HEENT: No icterus, No thrush, [place]/AT  Cardiovascular: IRRR,  no rubs, no gallops  Respiratory: Clear to auscultation bilaterally, no wheezing, no crackles, no rhonchi  Abdomen: Soft/+BS, non tender, non distended, no guarding  Extremities: trace edema, No lymphangitis, No petechiae, No rashes, no synovitis  Data Reviewed: Basic Metabolic Panel:  Lab 08/25/12 2725 08/24/12 0535 08/23/12 1500  NA 123* 122* 124*  K 3.9 3.5 3.9  CL 87* 86* 86*  CO2 26 27 27   GLUCOSE 95 95 118*  BUN 29* 27* 30*  CREATININE 1.30* 1.29* 1.21*  CALCIUM 9.1 9.1 9.6  MG -- -- --  PHOS -- -- --   Liver Function Tests:  Lab 08/23/12 1500  AST 30  ALT 24  ALKPHOS 116  BILITOT 0.5  PROT 8.0   ALBUMIN 3.2*    Lab 08/23/12 2030  LIPASE 41  AMYLASE --    Lab 08/23/12 2125  AMMONIA 20   CBC:  Lab 08/25/12 0645 08/24/12 2145 08/24/12 0535 08/23/12 1500  WBC 4.7 -- 4.9 6.6  NEUTROABS -- -- -- 4.4  HGB 7.6* 8.2* 6.6* 7.3*  HCT 22.6* 24.3* 20.1* 22.2*  MCV 84.3 -- 86.3 86.0  PLT 179 -- 247 242   Cardiac Enzymes:  Lab 08/24/12 0120 08/23/12 2032  CKTOTAL -- --  CKMB -- --  CKMBINDEX -- --  TROPONINI 0.40* 0.46*   BNP: No components found with this basename: POCBNP:5 CBG: No results found for this basename: GLUCAP:5 in the last 168 hours  No results found for this or any previous visit (from the past 240 hour(s)).   Scheduled Meds:   . acetaminophen  650 mg Oral Once  . carbamazepine  100 mg Oral BID  . carvedilol  6.25 mg Oral BID WC  . feeding supplement  237 mL Oral BID BM  . ferrous sulfate  325 mg Oral TID WC  . furosemide  20 mg Intravenous Once  . losartan  50 mg Oral Daily  . phytonadione  5 mg Oral Once  . sodium chloride  3 mL Intravenous Q12H  . temazepam  15 mg Oral Once  . tiotropium  18 mcg Inhalation Daily  . DISCONTD: ferric gluconate (FERRLECIT/NULECIT) IV  125 mg Intravenous Once  . DISCONTD: warfarin  7.5 mg Oral ONCE-1800  . DISCONTD: Warfarin - Pharmacist Dosing Inpatient   Does not apply q1800   Continuous Infusions:    Vannak Montenegro, DO  [facility] Pager [phone]  If 7PM-7AM, please contact night-coverage [email or web address] Password [email or web address] 08/25/2012, 1:37 PM   LOS: 2 days

## 2012-08-26 DIAGNOSIS — IMO0001 Reserved for inherently not codable concepts without codable children: Secondary | ICD-10-CM

## 2012-08-26 DIAGNOSIS — E871 Hypo-osmolality and hyponatremia: Secondary | ICD-10-CM

## 2012-08-26 DIAGNOSIS — D649 Anemia, unspecified: Secondary | ICD-10-CM

## 2012-08-26 DIAGNOSIS — J449 Chronic obstructive pulmonary disease, unspecified: Secondary | ICD-10-CM | POA: Diagnosis present

## 2012-08-26 DIAGNOSIS — J4489 Other specified chronic obstructive pulmonary disease: Secondary | ICD-10-CM

## 2012-08-26 LAB — TYPE AND SCREEN
Unit division: 0
Unit division: 0

## 2012-08-26 LAB — BASIC METABOLIC PANEL
BUN: 31 mg/dL — ABNORMAL HIGH (ref 6–23)
Creatinine, Ser: 1.22 mg/dL — ABNORMAL HIGH (ref 0.50–1.10)
GFR calc non Af Amer: 41 mL/min — ABNORMAL LOW (ref >90)
Glucose, Bld: 88 mg/dL (ref 70–99)
Potassium: 3.7 mEq/L (ref 3.5–5.1)

## 2012-08-26 LAB — CBC
HCT: 28.4 % — ABNORMAL LOW (ref 36.0–46.0)
Hemoglobin: 9.8 g/dL — ABNORMAL LOW (ref 12.0–15.0)
MCH: 28.3 pg (ref 26.0–34.0)
MCHC: 34.5 g/dL (ref 30.0–36.0)
MCV: 82.1 fL (ref 78.0–100.0)

## 2012-08-26 MED ORDER — INFLUENZA VIRUS VACC SPLIT PF IM SUSP
0.5000 mL | INTRAMUSCULAR | Status: AC
  Administered 2012-08-26: 0.5 mL via INTRAMUSCULAR
  Filled 2012-08-26: qty 0.5

## 2012-08-26 MED ORDER — DSS 100 MG PO CAPS: 200.0000 mg | ORAL_CAPSULE | Freq: Every evening | ORAL | Status: DC | PRN

## 2012-08-26 MED ORDER — DIGOXIN 250 MCG PO TABS
0.2500 mg | ORAL_TABLET | Freq: Four times a day (QID) | ORAL | Status: DC
  Administered 2012-08-26 (×2): 0.25 mg via ORAL
  Filled 2012-08-26 (×3): qty 1

## 2012-08-26 MED ORDER — UNABLE TO FIND: Status: DC

## 2012-08-26 MED ORDER — DIGOXIN 125 MCG PO TABS: 0.1250 mg | ORAL_TABLET | Freq: Every day | ORAL | Status: DC

## 2012-08-26 MED ORDER — DOCUSATE SODIUM 100 MG PO CAPS
200.0000 mg | ORAL_CAPSULE | Freq: Every evening | ORAL | Status: DC | PRN
  Filled 2012-08-26: qty 2

## 2012-08-26 MED ORDER — FERROUS SULFATE 325 (65 FE) MG PO TABS: 325.0000 mg | ORAL_TABLET | Freq: Three times a day (TID) | ORAL | Status: DC

## 2012-08-26 NOTE — Progress Notes (Signed)
Pt. Seen and examined. Agree with the NP/PA-C note as written.  Agree with the addition of digoxin for additional rate control with low normal bp.  Will need to check a level in 2-3 days. Watch with mild CKD.  Chrystie Nose, MD, [facility] Attending Cardiologist [facility]

## 2012-08-26 NOTE — Progress Notes (Signed)
DC orders received.  Patient stable with no S/S of distress.  Medication and discharge information reviewed with patient and family member.  Patient DC to ALF with daughter. Nolon Nations

## 2012-08-26 NOTE — Progress Notes (Signed)
[facility] Progress Note  Subjective:  Feels good.  No GI complaints.  Does not want any procedures at this time, and this has been discussed with her children.  Objective:  Vital signs in last 24 hours: Temp:  [97.3 F (36.3 C)-98.3 F (36.8 C)] 97.5 F (36.4 C) (09/23 0530) Pulse Rate:  [77-110] 110  (09/23 0840) Resp:  [17-20] 20  (09/23 0530) BP: (103-132)/(44-81) 130/81 mmHg (09/23 0840) SpO2:  [93 %-99 %] 93 % (09/23 0530) Last BM Date: 08/25/12 General:   Alert,  Well-developed, in NAD Heart:  Irregularly irregular.  No M/R/G. Pulm:  CTAB.  No W/R/R. Abdomen:  Soft, nontender and nondistended. Normal bowel sounds, without guarding, and without rebound.   Extremities:  Without edema. Neurologic:  Alert and  oriented x4;  grossly normal neurologically. Psych:  Alert and cooperative. Normal mood and affect.  Lab Results:  Basename 08/26/12 0700 08/25/12 0645 08/24/12 2145 08/24/12 0535  WBC 5.7 4.7 -- 4.9  HGB 9.8* 7.6* 8.2* --  HCT 28.4* 22.6* 24.3* --  PLT 173 179 -- 247   BMET  Basename 08/26/12 0700 08/25/12 0645 08/24/12 0535  NA 126* 123* 122*  K 3.7 3.9 3.5  CL 89* 87* 86*  CO2 22 26 27   GLUCOSE 88 95 95  BUN 31* 29* 27*  CREATININE 1.22* 1.30* 1.29*  CALCIUM 9.2 9.1 9.1   PT/INR  Basename 08/26/12 0700 08/25/12 0645  LABPROT 16.9* 20.4*  INR 1.41 1.82*   Mr Brain Wo Contrast  08/25/2012  *RADIOLOGY REPORT*  Clinical Data: Confusion.  Mental status changes.  MRI HEAD WITHOUT CONTRAST  Technique:  Multiplanar, multiecho pulse sequences of the brain and surrounding structures were obtained according to standard protocol without intravenous contrast.  Comparison: 08/23/2012 CT.  No comparison MR.  Findings: No acute infarct.  No intracranial hemorrhage.  Mild small vessel disease type changes.  No intracranial mass lesion detected on this unenhanced exam.  Global atrophy without hydrocephalus.  Major intracranial vascular structures are patent.  Small right vertebral artery may end in a PICA distribution.  Minimal ethmoid sinus mucosal thickening.  IMPRESSION: No acute infarct.  Please see above.   Original Report Authenticated By: Fuller [place], M.D.     Assessment / Plan: -Anemia, iron deficiency with heme negative stools.  S/P four units of PRBC's since 9/20.  -Chronic hyponatremia.  -Afib:  Currently off of anticoagulation. -Mental status change:  Improved.  Likely secondary to the Baclofen that she was taking. -CKD stage III  *Spoke with patient and her daughter.  It has been decided that she will remain on iron supplements, ferrous sulfate 325 mg, three times a day and monitor Hgb for now.  They do not want any GI procedures at this time. *Will sign off.  Call if needed.   LOS: 3 days   ZEHR, JESSICA D.  08/26/2012, 9:20 AM  Pager number [phone]   I have taken an interval history, reviewed the chart and examined the patient. I agree with the extender's note, impression and recommendations.   [name] Lick. Russella Dar MD Clementeen Graham

## 2012-08-26 NOTE — Discharge Summary (Addendum)
Physician Discharge Summary  Angela Franco WJX:[id number] DOB: Apr 16, 1929 DOA: 08/23/2012  PCP: No primary provider on file.  Admit date: 08/23/2012 Discharge date: 08/26/2012  Recommendations for Outpatient Follow-up:  1. Pt will need to follow up with PCP in 2-3 weeks post discharge 2. Follow up with [facility] in 2 weeks 3. Follow up with Dr. Elnoria Howard in 3 months 4. Follow up with Dr. Herbie Baltimore in 2 weeks 5. Oxygen 2 liters as needed for SOB  Discharge Diagnoses:  Acute encephalopathy  -Likely multifactorial secondary to acute drop in hemoglobin, baclofen use, ???hyponatremia (has been chronic)  -B12, urine drug screen negative  -MRI brain negative for acute findings  -TSH, RPR, NH3, Tegretol level unremarkable  -Urinalysis negative for UTI  -check ABG to rule out hypercarbia or hypoxemia  -Appreciate Neurology  Hyponatremia  -This has been chronic, partly due to Tegretol  -Serum sodium ranges 122-128 in past 9 months  -Serum osmolarity/urine Osm does not truly suggests SIADH  -Patient appears euvolemic  -Patient has been on Samsca the past with minimal improvement in sodium  -Would encourage patient to stop Tegretol and follow sodium before adding additional medications  Anemia  -LDH, haptoglobin negative for hemolysis  -Hemoccult is negative, will repeat x1  -Patient is on warfarin, but no obvious source of bleeding at this time  -Warfarin discontinued by cardiology  -Iron studies suggest a degree of iron deficiency  -Will give ferrous sulfate  -hold on ferric gluconate due to iron load the patient will receive with additional 2units packed red blood cells  -Transfused 2 units packed red blood cells(9/20 and 9/22)--4 units total  -check SPEP  -consulted GI today  Trigeminal neuralgia  -Patient has been on carbamazepine for 20 years, reluctant to change  -Consider adding gabapentin or Trileptal  -Neurology had been consulted per family request  CKD stage III  -Serum  creatinine appears at baseline  Chronic atrial fibrillation  -Rate controlled at this time, digoxin started by cards -Warfarin discontinued by cardiology  Elevated troponins  -Nonspecific  -Do not believe the patient is having an acute coronary syndrome  -Likely secondary to LVH, and ischemia, CKD  Chronic CHF, systolic  -Well compensated at this time without fluid overload    Discharge Condition: Stable  Disposition:  Follow-up Information    Schedule an appointment as soon as possible for a visit with Marykay Lex, MD.   Contact information:   [facility] [address] [place] [address] [phone]       Schedule an appointment as soon as possible for a visit with Theda Belfast, MD.   Contact information:   [address] [place] [address] [phone]       Schedule an appointment as soon as possible for a visit with STEWART,CHARLES R.   Contact information:   [address] [place] [address]       Follow up with [facility].   Contact information:   [address] [place] [address] [phone]       Assisted Living  Diet: Cardiac Wt Readings from Last 3 Encounters:  08/23/12 57.199 kg (126 lb 1.6 oz)  07/08/12 53.751 kg (118 lb 8 oz)  07/08/12 53.751 kg (118 lb 8 oz)    History of present illness:  78 year old female presents with acute mental status change. The patient lives in an assisted-living-type environment. Apparently, the patient was started on baclofen on Wednesday which was a new  medication. The patient's daughter noted worsening confusion starting on Thursday before admission with decreased oral intake. In addition, the patient has had dry heaves for the last 24 hours. The patient is confused at this time. History is obtained from speaking with the patient's son at the bedside and from the ER physician.  The patient's son states  that the patient has been in her usual state of health even one week ago and today her mentation is definite decline. The patient has had numerous admissions for hyponatremia in the past. Her sodium is 124 today which is near her baseline. Serum creatinine was 1.21 which is at her baseline. However it was noted that the patient's hemoglobin was 7.3 which is lower than her usual baseline of 9.5-10.5. There's been no history of epistaxis, hemoptysis, hematemesis, hematuria, or hematochezia or melena. It is unclear whether the patient had a recent endoscopy. Hemoccult emergency department was negative.   Hospital Course:  The patient was admitted to the hospital and extensive workup was undertaken. The patient was initially given intravenous fluids without any improvement of her serum sodium. This was subsequently discontinued. Urine drug screen, B12, TSH, RPR, ammonia, Tegretol level, urinalysis, ABG, and MRI of the brain were essentially unremarkable for any acute changes that would account for the patient's mentation. Again, it was thought that her encephalopathy may have been due to the baclofen. During the admission, her mentation gradually improved. Her mentation improved even more after the patient was given 2 units of packed red blood cells on September 20. An additional 2 units of packed red blood cells were provided on September 22. Her hemoglobin on the day of discharge was 9.8. Workup of her anemia revealed iron deficiency. The patient was given an intravenous dose of ferric gluconate and then she was started on ferrous sulfate 325 mg 3 times a day. The plan is for ferrous sulfate 325 3 times a day x1 month then twice a day indefinitely. The patient had mildly elevated troponins at 0.46. This was thought to be due to demand ischemia. Cardiology saw the patient did not recommend any further workup. Regarding the patient's atrial fibrillation, warfarin was stopped due to the patient's anemia. There are no  plans to continue warfarin at this time. She will need to followup with Dr. Susette Racer regarding further therapy of her atrial fibrillation. The patient was started on digoxin 0.125 mg daily. Her heart rate was under good control at the time of discharge. Regarding her encephalopathy, the patient's hyponatremia remained stable between 122 and 126. The patient remains hesitant to stop Tegretol. This was continued. The patient's mentation gradually improved throughout the hospitalization. The patient had increased oral intake. After discussing with the family, a decision was made to leave the patient off of Samsca. It was thought that the hyponatremia was not the conjunctivae factor to her current acute encephalopathy. The patient's renal function remained stable throughout the hospitalization. She will need followup with neurology regarding her trigeminal neuralgia. Neurology did see the patient, and they recommended that the patient's mentation improved before starting any further medications. He will follow her up as an outpatient. Gastroenterology, Dr. Elnoria Howard, was consulted. He offered endoscopy. After discussion with the family, they decided they do not want any further endoscopy at this time. Stool Hemoccult was negative. LDH and haptoglobin were unremarkable for hemolysis.  Consultants: Gastroenterology, Dr. Elnoria Howard Neurology, Dr. Roseanne Reno Cardiology, Dr. Herbie Baltimore  Discharge Exam: Filed Vitals:   08/26/12 1657  BP: 125/82  Pulse: 92  Temp:  Resp: 20   Filed Vitals:   08/26/12 0941 08/26/12 1232 08/26/12 1329 08/26/12 1657  BP: 106/57  112/74 125/82  Pulse:  105 95 92  Temp:   98 F (36.7 C)   TempSrc:   Oral   Resp:   20 20  Height:      Weight:      SpO2:   99% 94%   General: A&O x 3, NAD, pleasant, cooperative Cardiovascular: RRR, no rub, no gallop, no S3 Respiratory: CTAB, no wheeze, no rhonchi Abdomen:soft, nontender, nondistended, positive bowel sounds  Discharge Instructions        Discharge Orders    Future Orders Please Complete By Expires   Diet - low sodium heart healthy      Increase activity slowly          Medication List     As of 08/26/2012  5:06 PM    STOP taking these medications         baclofen 10 MG tablet   Commonly known as: LIORESAL      warfarin 5 MG tablet   Commonly known as: COUMADIN      TAKE these medications         aspirin 81 MG EC tablet   Take 1 tablet (81 mg total) by mouth daily.      Calcium-Vitamin D 500-400 MG-UNIT Tabs   Take 2 tablets by mouth daily.      carbamazepine 100 MG 12 hr capsule   Commonly known as: CARBATROL   Take 100 mg by mouth 2 (two) times daily.      carvedilol 6.25 MG tablet   Commonly known as: COREG   Take 1 tablet (6.25 mg total) by mouth 2 (two) times daily with a meal.      digoxin 0.125 MG tablet   Commonly known as: LANOXIN   Take 1 tablet (0.125 mg total) by mouth daily.      DSS 100 MG Caps   Take 200 mg by mouth at bedtime as needed.      ferrous sulfate 325 (65 FE) MG tablet   Take 1 tablet (325 mg total) by mouth 3 (three) times daily with meals.      furosemide 80 MG tablet   Commonly known as: LASIX   Take 1 tablet (80 mg total) by mouth daily.      losartan 50 MG tablet   Commonly known as: COZAAR   Take 1 tablet (50 mg total) by mouth daily.      potassium chloride SA 20 MEQ tablet   Commonly known as: K-DUR,KLOR-CON   Take 1 tablet (20 mEq total) by mouth 2 (two) times daily.      temazepam 15 MG capsule   Commonly known as: RESTORIL   Take 15 mg by mouth at bedtime as needed.      tiotropium 18 MCG inhalation capsule   Commonly known as: SPIRIVA   Place 18 mcg into inhaler and inhale daily.      UNABLE TO FIND   OXYGEN 2 LITERS PRN SOB AT ASSISTED LIVING           The results of significant diagnostics from this hospitalization (including imaging, microbiology, ancillary and laboratory) are listed below for reference.    Significant Diagnostic  Studies: Dg Chest 2 View  08/23/2012  *RADIOLOGY REPORT*  Clinical Data: Atrial fibrillation.  CHEST - 2 VIEW  Comparison: 06/29/2012.  Findings: Stable enlarged cardiac silhouette and linear scarring at the  left lung base.  Stable hyperexpansion of the lungs with mild diffuse peribronchial thickening and accentuation of the interstitial markings.  Resolved pleural fluid on the right. Stable bilateral nipple shadows.  Diffuse osteopenia.  Thoracic spine degenerative changes.  IMPRESSION:  1.  No acute abnormality. 2.  Stable cardiomegaly and changes of COPD and chronic bronchitis.   Original Report Authenticated By: Darrol Angel, M.D.    Ct Head Wo Contrast  08/23/2012  *RADIOLOGY REPORT*  Clinical Data:  confusion, altered mental status  CT HEAD WITHOUT CONTRAST  Technique:  Contiguous axial images were obtained from the base of the skull through the vertex without contrast.  Comparison: None.  Findings: No skull fracture is noted.  Paranasal sinuses and mastoid air cells are unremarkable.  Mild cerebral atrophy.  No intracranial hemorrhage, mass effect or midline shift.  No acute infarction.  No mass lesion is noted on this unenhanced scan.  Periventricular and patchy subcortical white matter decreased attenuation probable due to chronic small vessel ischemic changes.  IMPRESSION: No acute intracranial abnormality.  Mild cerebral atrophy. Periventricular and patchy subcortical white matter decreased attenuation probable due to chronic small vessel ischemic changes.   Original Report Authenticated By: Natasha Mead, M.D.    Mr Brain Wo Contrast  08/25/2012  *RADIOLOGY REPORT*  Clinical Data: Confusion.  Mental status changes.  MRI HEAD WITHOUT CONTRAST  Technique:  Multiplanar, multiecho pulse sequences of the brain and surrounding structures were obtained according to standard protocol without intravenous contrast.  Comparison: 08/23/2012 CT.  No comparison MR.  Findings: No acute infarct.  No intracranial  hemorrhage.  Mild small vessel disease type changes.  No intracranial mass lesion detected on this unenhanced exam.  Global atrophy without hydrocephalus.  Major intracranial vascular structures are patent. Small right vertebral artery may end in a PICA distribution.  Minimal ethmoid sinus mucosal thickening.  IMPRESSION: No acute infarct.  Please see above.   Original Report Authenticated By: Fuller Canada, M.D.      Microbiology: No results found for this or any previous visit (from the past 240 hour(s)).   Labs: Basic Metabolic Panel:  Lab 08/26/12 1610 08/25/12 0645 08/24/12 0535 08/23/12 1500  NA 126* 123* 122* 124*  K 3.7 3.9 -- --  CL 89* 87* 86* 86*  CO2 22 26 27 27   GLUCOSE 88 95 95 118*  BUN 31* 29* 27* 30*  CREATININE 1.22* 1.30* 1.29* 1.21*  CALCIUM 9.2 9.1 9.1 9.6  MG -- -- -- --  PHOS -- -- -- --   Liver Function Tests:  Lab 08/23/12 1500  AST 30  ALT 24  ALKPHOS 116  BILITOT 0.5  PROT 8.0  ALBUMIN 3.2*    Lab 08/23/12 2030  LIPASE 41  AMYLASE --    Lab 08/23/12 2125  AMMONIA 20   CBC:  Lab 08/26/12 0700 08/25/12 0645 08/24/12 2145 08/24/12 0535 08/23/12 1500  WBC 5.7 4.7 -- 4.9 6.6  NEUTROABS -- -- -- -- 4.4  HGB 9.8* 7.6* 8.2* 6.6* 7.3*  HCT 28.4* 22.6* 24.3* 20.1* 22.2*  MCV 82.1 84.3 -- 86.3 86.0  PLT 173 179 -- 247 242   Cardiac Enzymes:  Lab 08/24/12 0120 08/23/12 2032  CKTOTAL -- --  CKMB -- --  CKMBINDEX -- --  TROPONINI 0.40* 0.46*   BNP: No components found with this basename: POCBNP:5 CBG: No results found for this basename: GLUCAP:5 in the last 168 hours  Time coordinating discharge:  Greater than 30 minutes  Signed:  Sola Margolis, DO [facility] Pager: [phone] 08/26/2012, 5:06 PM

## 2012-08-26 NOTE — Progress Notes (Addendum)
CSW spoke with [facility] regarding pt being medically stable. Laureen plans to come assess patient for return this afternoon.   Per discussion with pt RN, there are discrepancies with pt medication list including coumadin, and other medications that pt was previously on. Rn informed pt MD, who will need to addend pt discharge summary and reconcile medications for avs.   .Catha Gosselin, LCSWA  [phone] .08/26/2012 1704pm   1738 ALF rn assessing patient at this time. Pt is anticipated to discharge to [facility] this evening. Pt transportation provided by daughter. .No further Clinical Social Work needs, signing off.   Catha Gosselin, Theresia Majors  [phone] .08/26/2012 1739pm

## 2012-08-26 NOTE — Progress Notes (Signed)
Clinical Social Work Department BRIEF PSYCHOSOCIAL ASSESSMENT 08/26/2012  Patient:  Angela Franco, Angela Franco     Account Number:  [id number]     Admit date:  08/23/2012  Clinical Social Worker:  Doree Albee  Date/Time:  08/26/2012 12:06 PM  Referred by:  RN  Date Referred:  08/26/2012 Referred for  ALF Placement   Other Referral:   Interview type:  Patient Other interview type:   and patient daughter    PSYCHOSOCIAL DATA Living Status:  FACILITY Admitted from facility:  [facility] Level of care:  Assisted Living Primary support name:  Gavin Pound [place] Primary support relationship to patient:  CHILD, ADULT Degree of support available:   strong    CURRENT CONCERNS Current Concerns  Post-Acute Placement   Other Concerns:    SOCIAL WORK ASSESSMENT / PLAN CSW met with pt at bedside to complete psychosocial assessment and assist with pt dc plans. Pt is alert and oriented with some memory impairment. Pt shared that she lives in an assisted living but unsure of the facility name. Pt agreed for CSW to contact pt daughter who will be assisting with pt dc plans, [name]. Pt expressed eagerness to return home to the alf as she is feeling better. Pt is also concerned about getting her hair done on 9/25 at the facility.    CSW spoke with pt daugher regarding pt dc plans and current living environement. Pt daughter shared that pt is a resident at [facility] of [facility].    Pt daughter plans to assist with transporting pt to alf when medically stable.    CSW will complete fl2 for MD signature and place in pt shadow chart.   Assessment/plan status:  Psychosocial Support/Ongoing Assessment of Needs Other assessment/ plan:   Information/referral to community resources:   none identified at this time, pt well connected to ALF community    PATIENT'S/FAMILY'S RESPONSE TO PLAN OF CARE: Pt and pt daughter thanked csw for concern and support. Pt  is motivated to return to alf when medically stable and hoping for today. Pt daughter will be visiting to assist with pt dc plans later this afternoon.      Catha Gosselin, Theresia Majors  [phone] .08/26/2012 12:19

## 2012-08-26 NOTE — Progress Notes (Signed)
UR Completed Cayden Granholm Graves-Bigelow, RN,BSN [phone]  

## 2012-08-26 NOTE — Progress Notes (Signed)
Clinical social worker has left 2 messages with pt resident care coordinator regarding pt returning to ALF today.   .Clinical social worker continuing to follow pt to assist with pt dc plans and further csw needs.   Catha Gosselin, Theresia Majors  [phone] .08/26/2012 1610pm

## 2012-08-26 NOTE — Progress Notes (Signed)
ANTICOAGULATION CONSULT NOTE - F/U Consult  Pharmacy Re:  Consult for Coumadin Indication: atrial fibrillation  Assessment: Ms Koshy is a 78 year old female admitted with AMS and on chronic coumadin for atrial fibrillation, continued in hospital. Her home dose was 7.5mg  on all days except Tuesday and Friday where she takes 5mg .  She had a drop in her Hgb and dose held, then resumed with subsequent drop again.  The work-up plan was for her to have a colonoscopy but the family is discussing ongoing intervention vs. comfort and quality of life.  Warfarin has now been discontinued and no plans to resume for now.   Plan:  - Discontinue Warfarin consult.    - Discontinue daily labs  - Please re-consult if you feel that this therapy needs to be resumed and you would like assistance from pharmacy.  Nadara Mustard, PharmD., MS Clinical Pharmacist Pager:  [phone]  Thank you for allowing pharmacy to be part of this patients care team.  Patient Measurements: Height: 5\' 3"  (160 cm) Weight: 126 lb 1.6 oz (57.199 kg) IBW/kg (Calculated) : 52.4   Labs:  Basename 08/26/12 0700 08/25/12 0645 08/24/12 2145 08/24/12 0536 08/24/12 0535 08/24/12 0120 08/23/12 2032  HGB 9.8* 7.6* -- -- -- -- --  HCT 28.4* 22.6* 24.3* -- -- -- --  PLT 173 179 -- -- 247 -- --  APTT -- -- -- -- -- -- --  LABPROT 16.9* 20.4* -- 27.7* -- -- --  INR 1.41 1.82* -- 2.75* -- -- --  HEPARINUNFRC -- -- -- -- -- -- --  CREATININE 1.22* 1.30* -- -- 1.29* -- --  CKTOTAL -- -- -- -- -- -- --  CKMB -- -- -- -- -- -- --  TROPONINI -- -- -- -- -- 0.40* 0.46*    Medical History: Past Medical History  Diagnosis Date  . COPD (chronic obstructive pulmonary disease)     Never smoked. Questionable diagnosis.  . Atrial fibrillation   . Osteopenia   . Insomnia   . Hyponatremia   . Hypertension   . UTI (lower urinary tract infection)   . Bursitis of hip   . Trigeminal neuralgia   . Altered mental status 08/24/2012  . Anemia,  with significant drop in H/H 08/24/2012    Medications:  Scheduled:     . acetaminophen  650 mg Oral Once  . carbamazepine  100 mg Oral BID  . carvedilol  6.25 mg Oral BID WC  . feeding supplement  237 mL Oral BID BM  . ferrous sulfate  325 mg Oral TID WC  . furosemide      . furosemide  20 mg Intravenous Once  . influenza  inactive virus vaccine  0.5 mL Intramuscular Tomorrow-1000  . losartan  50 mg Oral Daily  . sodium chloride  3 mL Intravenous Q12H  . temazepam  15 mg Oral Once  . tiotropium  18 mcg Inhalation Daily  . DISCONTD: ferric gluconate (FERRLECIT/NULECIT) IV  125 mg Intravenous Once  . DISCONTD: warfarin  7.5 mg Oral ONCE-1800  . DISCONTD: Warfarin - Pharmacist Dosing Inpatient   Does not apply q1800    08/26/2012 10:41 AM

## 2012-08-26 NOTE — Progress Notes (Signed)
Subjective:  "Ready to go"  Objective:  Vital Signs in the last 24 hours: Temp:  [97.3 F (36.3 C)-98.3 F (36.8 C)] 97.5 F (36.4 C) (09/23 0530) Pulse Rate:  [77-110] 110  (09/23 0840) Resp:  [17-20] 20  (09/23 0530) BP: (103-132)/(44-81) 106/57 mmHg (09/23 0941) SpO2:  [93 %-99 %] 93 % (09/23 0530)  Intake/Output from previous day:  Intake/Output Summary (Last 24 hours) at 08/26/12 1104 Last data filed at 08/26/12 0800  Gross per 24 hour  Intake  952.5 ml  Output    700 ml  Net  252.5 ml    Physical Exam: General appearance: alert, cooperative and no distress Lungs: scattered crackles Heart: regularly irregular rhythm   Rate: 90-110  Rhythm: atrial fibrillation  Lab Results:  Basename 08/26/12 0700 08/25/12 0645  WBC 5.7 4.7  HGB 9.8* 7.6*  PLT 173 179    Basename 08/26/12 0700 08/25/12 0645  NA 126* 123*  K 3.7 3.9  CL 89* 87*  CO2 22 26  GLUCOSE 88 95  BUN 31* 29*  CREATININE 1.22* 1.30*    Basename 08/24/12 0120 08/23/12 2032  TROPONINI 0.40* 0.46*   Hepatic Function Panel  Basename 08/23/12 1500  PROT 8.0  ALBUMIN 3.2*  AST 30  ALT 24  ALKPHOS 116  BILITOT 0.5  BILIDIR --  IBILI --    Basename 08/24/12 0535  CHOL 157    Basename 08/26/12 0700  INR 1.41    Imaging: Imaging results have been reviewed  Cardiac Studies:  Assessment/Plan:   Principal Problem:  *Altered mental status Active Problems:  Hypo-osmolality and hyponatremia   Elevated Troponin, (0.46), in the setting of rapid AF and anemia in a pt with no significant CAD at cath August 2013  Moderate to severe pulmonary hypertension, PA pressure in 70s June 2013  Non-ischemic cardiomyopathy EF improved from 30-35% to 40-45% by Echo in 07/2012  Anemia, Hgb 6.4 on adm 08/23/12, Coumadin discontinued   Normal coronary arteries August 2013  Atrial fibrillation, chronic  LBBB (left bundle branch block)  Chronic anticoagulation  HTN (hypertension)  COPD by CXR   Plan-  Her rate is still a little fast this am. Consider changing Coreg to Metoprolol as we may be able to push the dose more with her low B/P. Will go ahead and load her with Lanoxin now.   [employer] PA-C 08/26/2012, 11:04 AM

## 2012-08-26 NOTE — Care Management Note (Signed)
    Page 1 of 1   08/26/2012     3:25:35 PM   CARE MANAGEMENT NOTE 08/26/2012  Patient:  Angela Franco, Angela Franco   Account Number:  [id number]  Date Initiated:  08/26/2012  Documentation initiated by:  GRAVES-BIGELOW,Elena Davia  Subjective/Objective Assessment:   Pt admitted with AMS- low HGB. Transfused with 2 units PRBC's plan for d/c today to ALF.     Action/Plan:   No needs from CM at this time.   Anticipated DC Date:  08/26/2012   Anticipated DC Plan:  ASSISTED LIVING / REST HOME  In-house referral  Clinical Social Worker      DC Planning Services  CM consult      Choice offered to / List presented to:             Status of service:  Completed, signed off Medicare Important Message given?   (If response is "NO", the following Medicare IM given date fields will be blank) Date Medicare IM given:   Date Additional Medicare IM given:    Discharge Disposition:  ASSISTED LIVING  Per UR Regulation:  Reviewed for med. necessity/level of care/duration of stay  If discussed at Long Length of Stay Meetings, dates discussed:    Comments:

## 2012-08-27 LAB — PROTEIN ELECTROPHORESIS, SERUM
Beta 2: 4.9 % (ref 3.2–6.5)
Gamma Globulin: 26.6 % — ABNORMAL HIGH (ref 11.1–18.8)

## 2012-10-07 ENCOUNTER — Ambulatory Visit (INDEPENDENT_AMBULATORY_CARE_PROVIDER_SITE_OTHER): Payer: Medicare Other | Admitting: Family Medicine

## 2012-10-07 VITALS — BP 118/48 | HR 74 | Temp 98.0°F | Resp 16 | Ht 62.0 in | Wt 117.8 lb

## 2012-10-07 DIAGNOSIS — R21 Rash and other nonspecific skin eruption: Secondary | ICD-10-CM

## 2012-10-07 DIAGNOSIS — B86 Scabies: Secondary | ICD-10-CM

## 2012-10-07 MED ORDER — PERMETHRIN 5 % EX CREA: TOPICAL_CREAM | Freq: Once | CUTANEOUS | Status: DC

## 2012-10-07 NOTE — Progress Notes (Signed)
  Subjective:    Patient ID: Angela Franco, female    DOB: 05-24-1929, 78 y.o.   MRN: [id number]  HPI  Has had a rash for several wks that is pruritic and is being treated w/ top alcohol and hydrocortisone 1% but a nurse was going to give her something stronger.  Started on her back and now spreading to her abdomen and gluteal crease. Her PCP goes to the facility and didn't have the tools to do a skin scraping to look for scabies.  No known other infected contacts but they are worried that she would have to be isolated since she lives in a nursing home.  Past Medical History  Diagnosis Date  . COPD (chronic obstructive pulmonary disease)     Never smoked. Questionable diagnosis.  . Atrial fibrillation   . Osteopenia   . Insomnia   . Hyponatremia   . Hypertension   . UTI (lower urinary tract infection)   . Bursitis of hip   . Trigeminal neuralgia   . Altered mental status 08/24/2012  . Anemia, with significant drop in H/H 08/24/2012  . CHF (congestive heart failure)      Review of Systems  Constitutional: Negative for fever, chills and diaphoresis.  Musculoskeletal: Positive for arthralgias. Negative for joint swelling.  Skin: Positive for color change and rash. Negative for pallor and wound.  Hematological: Negative for adenopathy. Does not bruise/bleed easily.  Psychiatric/Behavioral: Positive for sleep disturbance.      BP 118/48  Pulse 74  Temp 98 F (36.7 C) (Oral)  Resp 16  Ht 5\' 2"  (1.575 m)  Wt 117 lb 12.8 oz (53.434 kg)  BMI 21.55 kg/m2  SpO2 96% Objective:   Physical Exam  Constitutional: She appears well-developed. No distress.  HENT:  Head: Normocephalic.  Pulmonary/Chest: Effort normal.  Neurological: She is alert.  Skin: Skin is warm and dry. Purpura and rash noted. Rash is papular. She is not diaphoretic.       multiple excoriated linear pin point papules over entire back and few on abdomen  Psychiatric: She has a normal mood and affect. Her behavior is  normal.          Results for orders placed in visit on 10/07/12  POCT SKIN KOH      Component Value Range   Skin KOH, POC Negative      Assessment & Plan:  1. Scabies - treat w/ permetherin. Reviewed cleanig/washing.

## 2012-10-07 NOTE — Patient Instructions (Signed)
We generally do not do skin scrapings to test for scabies.  It is unlikely that we would be able to find a scabies mite even if you do have it and to do a proper scraping to increase the chance of seeing a mite or egg can be extensive and painful.  When people have the symptoms and signs of scabies, we generally just treat as often the scrapings are negative even if a patient does have scabies.

## 2012-10-10 NOTE — Progress Notes (Signed)
Reviewed and agree.

## 2013-01-25 ENCOUNTER — Encounter: Payer: Self-pay | Admitting: Nurse Practitioner

## 2013-02-21 ENCOUNTER — Ambulatory Visit (INDEPENDENT_AMBULATORY_CARE_PROVIDER_SITE_OTHER): Payer: Medicare Other | Admitting: Nurse Practitioner

## 2013-02-21 ENCOUNTER — Encounter: Payer: Self-pay | Admitting: Nurse Practitioner

## 2013-02-21 VITALS — BP 159/77 | HR 66 | Ht 63.5 in | Wt 130.0 lb

## 2013-02-21 DIAGNOSIS — R4182 Altered mental status, unspecified: Secondary | ICD-10-CM

## 2013-02-21 DIAGNOSIS — G500 Trigeminal neuralgia: Secondary | ICD-10-CM

## 2013-02-21 NOTE — Progress Notes (Signed)
HPI:   Very pleasant elderly female returns for followup with facial pain and history of memory loss. She is doing well with her facial pain and her memory loss is stable. She is accompanied by her son today. She has no new complaints.  ROS: ...easy bruising, insomnia, wheezing, swelling in legs, allergies Physical Exam General: well developed, well nourished, seated, in no evident distress Head: head normocephalic and atraumatic. Orohparynx benign Neck: supple with no carotid or supraclavicular bruits Cardiovascular: regular rate and rhythm, no murmurs  Neurologic Exam Mental Status: Awake and fully alert. Oriented to place and time. Recent and remote memory intact. Attention span, concentration and fund of knowledge appropriate. Mood and affect appropriate. MMSE 27/30. AFT 10.  Cranial Nerves:  Pupils equal, briskly reactive to light. Extraocular movements full without nystagmus. Visual fields full to confrontation. Hearing intact and symmetric to finer snap. Facial sensation intact. Face, tongue, palate move normally and symmetrically. Neck flexion and extension normal.  Motor: Normal bulk and tone. Normal strength in all tested extremity muscles.   Coordination: Rapid alternating movements normal in all extremities. Finger-to-nose and heel-to-shin performed accurately bilaterally. Gait and Station: Arises from chair without difficulty. Stance is wide based.Ambulates with cane.   Reflexes: 1+ and symmetric. Toes downgoing.     ASSESSMENT:: Facial pain, mild cognitive impairment Mini-Mental Status exam stable. May consider  Aricept or Namenda at a later date.     PLAN: Continue Gabapentin and CBZ. FU in 6 months. After root canal call if pain worsens.       Nilda Riggs, GNP-BC APRN

## 2013-02-21 NOTE — Patient Instructions (Addendum)
Continue medications for facial pain. Have root canal done and call us if pain worsens after that give [place] a call.  We can go up on medications at that time.  Continue  Exercise program with walker  F/U 6 months

## 2013-08-01 ENCOUNTER — Telehealth: Payer: Self-pay | Admitting: Neurology

## 2013-08-01 NOTE — Telephone Encounter (Signed)
I have talked with her PCP Dr. Katrinka Blazing, patient has low Na 120s, no facial pain, she has been taking carbatrol 100mg  bid for 20 years for right trigeminal neuralgia, it will be tapered off, she is also taking gabapentin 100 mg 3 tablets 3 times a day, will follow up in Sep 2014.

## 2013-08-29 ENCOUNTER — Encounter: Payer: Self-pay | Admitting: Nurse Practitioner

## 2013-08-29 ENCOUNTER — Ambulatory Visit (INDEPENDENT_AMBULATORY_CARE_PROVIDER_SITE_OTHER): Payer: Medicare Other | Admitting: Nurse Practitioner

## 2013-08-29 VITALS — BP 173/84 | HR 68 | Ht 62.75 in | Wt 129.5 lb

## 2013-08-29 DIAGNOSIS — G500 Trigeminal neuralgia: Secondary | ICD-10-CM

## 2013-08-29 DIAGNOSIS — R4182 Altered mental status, unspecified: Secondary | ICD-10-CM

## 2013-08-29 NOTE — Patient Instructions (Addendum)
Per skilled sheet 

## 2013-08-29 NOTE — Progress Notes (Signed)
[facility]  PATIENT: Angela Franco DOB: 04-Sep-1929   REASON FOR VISIT: Followup for trigeminal neuralgia and mild memory loss   HISTORY OF PRESENT ILLNESS: Angela Franco, 79 year old female returns for followup . She has history of  facial pain and history of memory loss. She is doing well with her facial pain and her memory loss is stable. Her primary care physician Dr. Katrinka Blazing called to the office 08/01/2013 and spoke to  Dr. Terrace Arabia about decreasing carbamazepine due to  sodium levels in the low 120s. She continues to take one carbamazepine daily She is also on gabapentin. She is accompanied by her son today. She is currently at an assisted living. She has no new complaints    REVIEW OF SYSTEMS: Full 14 system review of systems performed and notable only for:  Constitutional: N/A  Cardiovascular: N/A  Ear/Nose/Throat: N/A  Skin: N/A  Eyes: N/A  Respiratory: Shortness or breath, cough  Gastroitestinal: N/A  Hematology/Lymphatic: N/A  Endocrine: N/A Musculoskeletal:N/A  Allergy/Immunology: N/A  Neurological: N/A Psychiatric: Insomnia  ALLERGIES: Allergies  Allergen Reactions  . Penicillins Hives    HOME MEDICATIONS: Outpatient Prescriptions Prior to Visit  Medication Sig Dispense Refill  . Calcium Carb-Cholecalciferol (CALCIUM-VITAMIN D) 500-400 MG-UNIT TABS Take 2 tablets by mouth daily.      . carbamazepine (CARBATROL) 100 MG 12 hr capsule Take 100 mg by mouth 2 (two) times daily.      . carvedilol (COREG) 6.25 MG tablet Take 1 tablet (6.25 mg total) by mouth 2 (two) times daily with a meal.  60 tablet  0  . digoxin (LANOXIN) 0.125 MG tablet Take 1 tablet (0.125 mg total) by mouth daily.  30 tablet  3  . docusate sodium 100 MG CAPS Take 200 mg by mouth at bedtime as needed.  60 capsule  0  . ferrous sulfate 325 (65 FE) MG tablet Take 1 tablet (325 mg total) by mouth 3 (three) times daily with meals.  90 tablet  3  . gabapentin (NEURONTIN) 100 MG capsule  Take 100 mg by mouth 3 (three) times daily.      [name] ibuprofen (ADVIL,MOTRIN) 400 MG tablet Take 400 mg by mouth every 6 (six) hours as needed for pain.      [name] losartan (COZAAR) 50 MG tablet Take 1 tablet (50 mg total) by mouth daily.  30 tablet  0  . oxycodone (OXY-IR) 5 MG capsule Take 5 mg by mouth every 4 (four) hours as needed.      Bertram Gala Glycol-Propyl Glycol (SYSTANE) 0.4-0.3 % SOLN Apply 5 mg to eye. 1 drop left eye, right or both eyes PRN      . simvastatin (ZOCOR) 5 MG tablet Take 5 mg by mouth at bedtime.      . temazepam (RESTORIL) 15 MG capsule Take 15 mg by mouth at bedtime as needed.      . tiotropium (SPIRIVA) 18 MCG inhalation capsule Place 18 mcg into inhaler and inhale daily.      [name] UNABLE TO FIND OXYGEN 2 LITERS PRN SOB AT ASSISTED LIVING  2 L  0  . permethrin (ACTICIN) 5 % cream Apply topically once. From neck to toes, wash off after 8-12 hrs  60 g  0  . furosemide (LASIX) 80 MG tablet Take 1 tablet (80 mg total) by mouth daily.  30 tablet  0  . potassium chloride SA (K-DUR,KLOR-CON) 20 MEQ tablet Take 1 tablet (20 mEq total) by mouth 2 (two) times daily.  60 tablet  0   No facility-administered medications prior to visit.    PAST MEDICAL HISTORY: Past Medical History  Diagnosis Date  . COPD (chronic obstructive pulmonary disease)     Never smoked. Questionable diagnosis.  . Atrial fibrillation   . Osteopenia   . Insomnia   . Hyponatremia   . Hypertension   . UTI (lower urinary tract infection)   . Bursitis of hip   . Trigeminal neuralgia   . Altered mental status 08/24/2012  . Anemia, with significant drop in H/H 08/24/2012  . CHF (congestive heart failure)   . High blood pressure   . Heart disease   . Atrial fibrillation   . Acute facial pain     PAST SURGICAL HISTORY: Past Surgical History  Procedure Laterality Date  . Appendectomy    . Tonsillectomy and adenoidectomy    . Cardiac catheterization    . Tonsillectomy    . Appendectomy      FAMILY  HISTORY: Family History  Problem Relation Age of Onset  . Heart attack Brother   . Heart attack Father     SOCIAL HISTORY: History   Social History  . Marital Status: Widowed    Spouse Name: N/A    Number of Children: N/A  . Years of Education: N/A   Occupational History  . Not on file.   Social History Main Topics  . Smoking status: Never Smoker   . Smokeless tobacco: Never Used  . Alcohol Use: No  . Drug Use: No  . Sexual Activity: Not on file   Other Topics Concern  . Not on file   Social History Narrative  . No narrative on file     PHYSICAL EXAM  Filed Vitals:   08/29/13 1434  BP: 173/84  Pulse: 68  Height: 5' 2.75" (1.594 m)  Weight: 129 lb 8 oz (58.741 kg)   Body mass index is 23.12 kg/(m^2).  Generalized: Well developed, in no acute distress  Head: normocephalic and atraumatic,. Oropharynx benign  Neck: Supple, no carotid bruits  Cardiac: Regular rate rhythm, no murmur   Neurological examination   Mentation: Alert oriented to time, place, MMSE 27/30.[name] AFT 9.  Follows all commands speech and language fluent  Cranial nerve II-XII: Pupils were equal round reactive to light extraocular movements were full, visual field were full on confrontational test. Facial sensation and strength were normal. hearing was intact to finger rubbing bilaterally. Uvula tongue midline. head turning and shoulder shrug and were normal and symmetric.Tongue protrusion into cheek strength was normal. Motor: normal bulk and tone, full strength in the BUE, BLE, fine finger movements normal, no pronator drift. No focal weakness Sensory: normal and symmetric to light touch, pinprick, and  vibration  Coordination: finger-nose-finger, heel-to-shin bilaterally, no dysmetria Reflexes: 1+ upper and lower and symmetric Gait and Station: Rising up from seated position without assistance, normal stance,  moderate stride,  smooth turning, able to perform tiptoe, and heel walking without  difficulty. Tandem gait is unsteady.  DIAGNOSTIC DATA (LABS, IMAGING, TESTING) -None to review   ASSESSMENT AND PLAN  79 y.o. year old female  has a past medical history of COPD  Atrial fibrillation; Insomnia; Hyponatremia; Hypertension;  Trigeminal neuralgia; mild memory loss  Anemia,  CHF (congestive heart failure); High blood pressure; Heart disease; here for followup. She has had hyponatremia on her carbamazepine and the dose was decreased 08/01/2013 by Dr. Terrace Arabia to daily  Discontinue carbamazepine Continue gabapentin 300 mg 3 times daily, call if  facial pain worsens Memory score is stable, may consider Aricept or Namenda in the future Followup in 6 months  Nilda Riggs, [facility], [facility], APRN  [facility] [address] [place], [place] [address] [phone]

## 2013-09-10 ENCOUNTER — Telehealth: Payer: Self-pay | Admitting: Neurology

## 2013-09-11 MED ORDER — GABAPENTIN 100 MG PO CAPS: ORAL_CAPSULE | ORAL | Status: DC

## 2013-09-11 NOTE — Telephone Encounter (Signed)
Called facility, will increase Gabapentin to 4 doses daily for a week then 5 if needed. Will need to fax order to [phone].

## 2013-09-11 NOTE — Telephone Encounter (Signed)
Spoke to patient. She says since her last OV and med changes, she has noticed a sharp pain on the R side of her face. Pain is more present while brushing teeth. She says she thought pain would subside by now since having med changes but it has not. She is requesting advice.

## 2013-09-12 NOTE — Telephone Encounter (Signed)
Called and spoke with med tech. Will fax order for Gabapentin 100mg  BID and continue (3) 100mg  tabs at hs. Order will be faxed

## 2014-01-14 ENCOUNTER — Telehealth: Payer: Self-pay | Admitting: Internal Medicine

## 2014-01-14 NOTE — Telephone Encounter (Signed)
Please call,concerning her mother heart rate. She was told if her mother heart rate got below 55 to call the office. Today it is 50,please call to advise.[name]

## 2014-01-14 NOTE — Telephone Encounter (Signed)
Spoke to daughter.  She states Angela Franco sent a request  concerning her digoxin dosage. Informed daughter that Dr Debara Pickett.  Reviewed and commented on request. Hold digoxin Faxed back to [facility] [place].  Daughter states her mother has an appointment on 02/17/2014.

## 2014-01-15 ENCOUNTER — Telehealth: Payer: Self-pay | Admitting: Internal Medicine

## 2014-01-15 NOTE — Telephone Encounter (Signed)
Dr Debara Pickett - this was a triage call handled by Ivin Booty, RN yesterday   Order to hold digoxin was faxed to [facility] but they need information regarding what the patient's target HR range should be, at what HR our office should be notified, and order to take pulse daily.   I will request her chart as she has not been seen since [facility]

## 2014-01-15 NOTE — Telephone Encounter (Signed)
Notify us if the pulse is <50 and the patient is symptomatic (severe fatigue, short of breath, chest pain) or >120 consistently. Therefore, HR should range between 50 and 120.  Dr. Debara Pickett

## 2014-01-15 NOTE — Telephone Encounter (Signed)
Need to fax an order over to [facility] at [place] that they need to take her pulse everyday. Please give them the range where they need to contact you if it is too high. Fax#-[phone].

## 2014-01-15 NOTE — Telephone Encounter (Signed)
Message forwarded to J. Elkins, RN.  

## 2014-01-15 NOTE — Telephone Encounter (Signed)
Will defer to Eliezer Lofts, RN (Dr. Lysbeth Penner nurse) to send orders.

## 2014-01-16 ENCOUNTER — Telehealth: Payer: Self-pay | Admitting: Internal Medicine

## 2014-01-16 ENCOUNTER — Encounter: Payer: Self-pay | Admitting: *Deleted

## 2014-01-16 NOTE — Telephone Encounter (Signed)
Returned call to Angela Franco.  Stated she received fax, but needs to know if she is supposed to check pt's pulse every day now that digoxin is being held.  Stated she does not have an order to check pulse daily.  Please clarify and fax back.  Thanks.  Message forwarded to [place], Therapist, sports.

## 2014-01-16 NOTE — Telephone Encounter (Signed)
Letter with instructions per Dr. Debara Pickett faxed to [facility] and provided number

## 2014-01-16 NOTE — Telephone Encounter (Signed)
Order faxed.

## 2014-01-16 NOTE — Telephone Encounter (Signed)
Please call her orders concerning her Digoxin please.

## 2014-02-16 ENCOUNTER — Encounter: Payer: Self-pay | Admitting: *Deleted

## 2014-02-17 ENCOUNTER — Encounter: Payer: Self-pay | Admitting: Internal Medicine

## 2014-02-17 ENCOUNTER — Ambulatory Visit (INDEPENDENT_AMBULATORY_CARE_PROVIDER_SITE_OTHER): Payer: Medicare Other | Admitting: Internal Medicine

## 2014-02-17 VITALS — BP 136/64 | HR 61 | Ht 63.0 in | Wt 129.2 lb

## 2014-02-17 DIAGNOSIS — I428 Other cardiomyopathies: Secondary | ICD-10-CM

## 2014-02-17 DIAGNOSIS — Z0389 Encounter for observation for other suspected diseases and conditions ruled out: Secondary | ICD-10-CM

## 2014-02-17 DIAGNOSIS — I429 Cardiomyopathy, unspecified: Secondary | ICD-10-CM

## 2014-02-17 DIAGNOSIS — I447 Left bundle-branch block, unspecified: Secondary | ICD-10-CM

## 2014-02-17 DIAGNOSIS — I10 Essential (primary) hypertension: Secondary | ICD-10-CM

## 2014-02-17 DIAGNOSIS — I4891 Unspecified atrial fibrillation: Secondary | ICD-10-CM

## 2014-02-17 DIAGNOSIS — I272 Pulmonary hypertension, unspecified: Secondary | ICD-10-CM

## 2014-02-17 DIAGNOSIS — IMO0001 Reserved for inherently not codable concepts without codable children: Secondary | ICD-10-CM

## 2014-02-17 DIAGNOSIS — I482 Chronic atrial fibrillation, unspecified: Secondary | ICD-10-CM

## 2014-02-17 DIAGNOSIS — I2789 Other specified pulmonary heart diseases: Secondary | ICD-10-CM

## 2014-02-17 NOTE — Progress Notes (Signed)
OFFICE NOTE  Chief Complaint:  Leg discoloration  Primary Care Physician: Pcp Not In System  HPI:  Angela Franco is an 80 year old female who was recently hospitalized for hyponatremia and atrial fibrillation. She had a cardiac catheterization last summer that showed an EF of 30% to 35%; however, this improved to 40% to 45% with a repeat echocardiogram. She also has a chronic left bundle branch block. She has had problems with encephalopathy and was on Tegretol in the past. These symptoms have improved; however, still has persistent atrial fibrillation. In addition, during her hospitalization, it was complicated by anemia and was found to be iron deficient. She also had a GI workup, which did not reveal any bleeding source. She was taken off of warfarin, which she was taking at the time, but was never restarted. Overall, she seems much better and in fact is not complaining of nearly as many problems since her hospitalization. It is also important to remember that she had a trigeminal neuralgia as well that stopped.   At her last office visit I recommended starting Eliquis which she has been taking without any bleeding problems. She is maintaining atrial fibrillation today which is probably permanent A. fib at this point. She denies any worsening shortness of breath or chest pain.  PMHx:  Past Medical History  Diagnosis Date  . COPD (chronic obstructive pulmonary disease)     Never smoked. Questionable diagnosis.  . Atrial fibrillation   . Osteopenia   . Insomnia   . Hyponatremia   . Hypertension   . UTI (lower urinary tract infection)   . Bursitis of hip   . Trigeminal neuralgia   . Altered mental status 08/24/2012  . Anemia, with significant drop in H/H 08/24/2012  . CHF (congestive heart failure)   . Heart disease   . Acute facial pain   . LBBB (left bundle branch block)   . Nonischemic cardiomyopathy   . Dyslipidemia     Past Surgical History  Procedure Laterality Date    . Appendectomy    . Tonsillectomy and adenoidectomy    . Cardiac catheterization  07/04/2012    normal coronaries, EF 30-35%, elevated R & L heart pressures, reduced CO, mild pulm HTN (Dr. K. Mali Lakeyia Surber)  . Transthoracic echocardiogram  2013    mild conc LVH, RV mildly dilated; LA severely dilated; RA mod dilated; mild-mod mitral annular calcif, calcif of anterior & posterior MV leaflet, mod MR; mod-severe TR with RSVP 30-41mmHg; mild calcif of AV leaflets and mod aortic regurg; mild pulm valve regurg; pericardial effusion (right)  . Nm myocar perf wall motion  2011    lexiscan myoview - normal LV systolic function w/normal wall motion, no evidence of scar/ischemia    FAMHx:  Family History  Problem Relation Age of Onset  . Stroke Father   . Heart attack Father   . Heart attack Brother   . Heart attack Brother   . Alzheimer's disease Mother     SOCHx:   reports that she has never smoked. She has never used smokeless tobacco. She reports that she does not drink alcohol or use illicit drugs.  ALLERGIES:  Allergies  Allergen Reactions  . Penicillins Hives    ROS: A comprehensive review of systems was negative except for: Cardiovascular: positive for irregular heart beat and lower extremity edema  HOME MEDS: Current Outpatient Prescriptions  Medication Sig Dispense Refill  . apixaban (ELIQUIS) 2.5 MG TABS tablet Take 2.5 mg by mouth 2 (two)  times daily.      . carvedilol (COREG) 6.25 MG tablet Take 1 tablet (6.25 mg total) by mouth 2 (two) times daily with a meal.  60 tablet  0  . docusate sodium 100 MG CAPS Take 200 mg by mouth at bedtime as needed.  60 capsule  0  . ferrous sulfate 325 (65 FE) MG tablet Take 1 tablet (325 mg total) by mouth 3 (three) times daily with meals.  90 tablet  3  . gabapentin (NEURONTIN) 400 MG capsule Take 400 mg by mouth 2 (two) times daily.      [name] ibuprofen (ADVIL,MOTRIN) 400 MG tablet Take 400 mg by mouth every 6 (six) hours as needed for pain.       [name] losartan (COZAAR) 50 MG tablet Take 1 tablet (50 mg total) by mouth daily.  30 tablet  0  . oxycodone (OXY-IR) 5 MG capsule Take 5 mg by mouth every 4 (four) hours as needed.      Vladimir Faster Glycol-Propyl Glycol (SYSTANE) 0.4-0.3 % SOLN Apply 5 mg to eye. 1 drop left eye, right or both eyes PRN      . potassium chloride SA (K-DUR,KLOR-CON) 20 MEQ tablet Take 1 tablet (20 mEq total) by mouth 2 (two) times daily.  60 tablet  0  . simvastatin (ZOCOR) 10 MG tablet Take 5 mg by mouth daily.      . temazepam (RESTORIL) 15 MG capsule Take 15 mg by mouth at bedtime as needed.      . tiotropium (SPIRIVA) 18 MCG inhalation capsule Place 18 mcg into inhaler and inhale daily.      [name] UNABLE TO FIND OXYGEN 2 LITERS PRN SOB AT ASSISTED LIVING  2 L  0  . UNABLE TO FIND Med Name: Haywood Pao Chew 500-600 Chew 1 tablet by mouth twice daily.      . furosemide (LASIX) 80 MG tablet Take 40 mg by mouth 3 (three) times a week.       No current facility-administered medications for this visit.    LABS/IMAGING: No results found for this or any previous visit (from the past 48 hour(s)). No results found.  VITALS: BP 136/64  Pulse 61  Ht 5\' 3"  (1.6 m)  Wt 129 lb 3.2 oz (58.605 kg)  BMI 22.89 kg/m2  EXAM: General appearance: alert and no distress Neck: no carotid bruit and no JVD Lungs: clear to auscultation bilaterally Heart: irregularly irregular rhythm Abdomen: soft, non-tender; bowel sounds normal; no masses,  no organomegaly Extremities: edema trace and venous stasis dermatitis noted Pulses: 2+ and symmetric Skin: Skin color, texture, turgor normal. No rashes or lesions Neurologic: Grossly normal Psych: Mood, affect normal  EKG: Atrial fibrillation at 61  ASSESSMENT: 1. Permanent atrial fibrillation 2. Left bundle Branch block 3. Ischemic cardiomyopathy EF 40-45% 4. Dyslipidemia  PLAN: 1.   Angela Franco is doing well without new heart failure symptoms. She is active and is not had any  worsening shortness of breath. She will be due for a repeat echocardiogram before her next visit. This is to assess for any changes in her ejection fraction, but symptomatically she is able to exercise without any problems in fact when walking for several miles up and down hills without shortness of breath. She's tolerating Eliquis without any bleeding problems.  Plan to see her back in a year we will discuss the results of her echo.  Pixie Casino, MD, [facility] Attending Cardiologist [facility]  Caspar Favila C 02/17/2014, 5:19 PM

## 2014-02-17 NOTE — Patient Instructions (Addendum)
Your physician wants you to follow-up in: 1 year.  You will receive a reminder letter in the mail two months in advance. If you don't receive a letter, please call our office to schedule the follow-up appointment.  Your physician has requested that you have an echocardiogram. Echocardiography is a painless test that uses sound waves to create images of your heart. It provides your doctor with information about the size and shape of your heart and how well your heart's chambers and valves are working. This procedure takes approximately one hour. There are no restrictions for this procedure.  Please have this echo prior to your office visit in 1 year.

## 2014-02-27 ENCOUNTER — Ambulatory Visit (INDEPENDENT_AMBULATORY_CARE_PROVIDER_SITE_OTHER): Payer: Medicare Other | Admitting: Nurse Practitioner

## 2014-02-27 ENCOUNTER — Encounter: Payer: Self-pay | Admitting: Nurse Practitioner

## 2014-02-27 ENCOUNTER — Telehealth: Payer: Self-pay | Admitting: Nurse Practitioner

## 2014-02-27 VITALS — BP 139/69 | HR 75 | Ht 63.0 in | Wt 130.0 lb

## 2014-02-27 DIAGNOSIS — G500 Trigeminal neuralgia: Secondary | ICD-10-CM

## 2014-02-27 DIAGNOSIS — R4182 Altered mental status, unspecified: Secondary | ICD-10-CM

## 2014-02-27 NOTE — Progress Notes (Signed)
[facility]  PATIENT: Angela Franco DOB: [birth year]  REASON FOR VISIT: Followup for trigeminal neuralgia and mild memory loss    HISTORY OF PRESENT ILLNESS: [name], 80 year old female returns for followup. She has a history of facial pain and mild memory loss. Her memory score is stable today. Due to his sodium levels in the low 120s her carbamazepine  has been discontinued and she is on gabapentin for her facial pain with good control. She returns today with her daughter. She is currently in an assisted living. She exercises on a regular basis. She has no new complaints. She returns for reevaluation   HISTORY: She has history of facial pain and history of memory loss. She is doing well with her facial pain and her memory loss is stable. Her primary care physician Dr. Tamala Julian called to the office 08/01/2013 and spoke to Dr. Krista Blue about decreasing carbamazepine due to sodium levels in the low 120s. She continues to take one carbamazepine daily She is also on gabapentin. She is accompanied by her son today. She is currently at an assisted living. She has no new complaints    REVIEW OF SYSTEMS: Full 14 system review of systems performed and notable only for those listed, all others are neg:  Constitutional: N/A  Cardiovascular: N/A  Ear/Nose/Throat: N/A  Skin: N/A  Eyes: N/A  Respiratory: N/A  Gastroitestinal: N/A  Hematology/Lymphatic: N/A  Endocrine: N/A Musculoskeletal:N/A  Allergy/Immunology: Allergies  Neurological: N/A Psychiatric: N/A   ALLERGIES: Allergies  Allergen Reactions  . Penicillins Hives    HOME MEDICATIONS: Outpatient Prescriptions Prior to Visit  Medication Sig Dispense Refill  . apixaban (ELIQUIS) 2.5 MG TABS tablet Take 2.5 mg by mouth 2 (two) times daily.      . carvedilol (COREG) 6.25 MG tablet Take 1 tablet (6.25 mg total) by mouth 2 (two) times daily with a meal.  60 tablet  0  . docusate sodium 100 MG CAPS Take 200 mg by mouth at  bedtime as needed.  60 capsule  0  . ferrous sulfate 325 (65 FE) MG tablet Take 1 tablet (325 mg total) by mouth 3 (three) times daily with meals.  90 tablet  3  . gabapentin (NEURONTIN) 400 MG capsule Take 400 mg by mouth 2 (two) times daily.      [name] ibuprofen (ADVIL,MOTRIN) 400 MG tablet Take 400 mg by mouth every 6 (six) hours as needed for pain.      [name] losartan (COZAAR) 50 MG tablet Take 1 tablet (50 mg total) by mouth daily.  30 tablet  0  . oxycodone (OXY-IR) 5 MG capsule Take 5 mg by mouth every 4 (four) hours as needed.      Vladimir Faster Glycol-Propyl Glycol (SYSTANE) 0.4-0.3 % SOLN Apply 5 mg to eye. 1 drop left eye, right or both eyes PRN      . potassium chloride SA (K-DUR,KLOR-CON) 20 MEQ tablet Take 1 tablet (20 mEq total) by mouth 2 (two) times daily.  60 tablet  0  . simvastatin (ZOCOR) 10 MG tablet Take 5 mg by mouth daily.      . temazepam (RESTORIL) 15 MG capsule Take 15 mg by mouth at bedtime as needed.      . tiotropium (SPIRIVA) 18 MCG inhalation capsule Place 18 mcg into inhaler and inhale daily.      [name] UNABLE TO FIND OXYGEN 2 LITERS PRN SOB AT ASSISTED LIVING  2 L  0  . UNABLE TO FIND Med Name: Darron Doom  Lemon Chew 500-600 Chew 1 tablet by mouth twice daily.      . furosemide (LASIX) 80 MG tablet Take 40 mg by mouth 3 (three) times a week.       No facility-administered medications prior to visit.    PAST MEDICAL HISTORY: Past Medical History  Diagnosis Date  . COPD (chronic obstructive pulmonary disease)     Never smoked. Questionable diagnosis.  . Atrial fibrillation   . Osteopenia   . Insomnia   . Hyponatremia   . Hypertension   . UTI (lower urinary tract infection)   . Bursitis of hip   . Trigeminal neuralgia   . Altered mental status 08/24/2012  . Anemia, with significant drop in H/H 08/24/2012  . CHF (congestive heart failure)   . Heart disease   . Acute facial pain   . LBBB (left bundle branch block)   . Nonischemic cardiomyopathy   . Dyslipidemia      PAST SURGICAL HISTORY: Past Surgical History  Procedure Laterality Date  . Appendectomy    . Tonsillectomy and adenoidectomy    . Cardiac catheterization  07/04/2012    normal coronaries, EF 30-35%, elevated R & L heart pressures, reduced CO, mild pulm HTN (Dr. K. Mali Hilty)  . Transthoracic echocardiogram  2013    mild conc LVH, RV mildly dilated; LA severely dilated; RA mod dilated; mild-mod mitral annular calcif, calcif of anterior & posterior MV leaflet, mod MR; mod-severe TR with RSVP 30-24mmHg; mild calcif of AV leaflets and mod aortic regurg; mild pulm valve regurg; pericardial effusion (right)  . Nm myocar perf wall motion  2011    lexiscan myoview - normal LV systolic function w/normal wall motion, no evidence of scar/ischemia    FAMILY HISTORY: Family History  Problem Relation Age of Onset  . Stroke Father   . Heart attack Father   . Heart attack Brother   . Heart attack Brother   . Alzheimer's disease Mother     SOCIAL HISTORY: History   Social History  . Marital Status: Widowed    Spouse Name: N/A    Number of Children: 3  . Years of Education: 12   Occupational History  . Not on file.   Social History Main Topics  . Smoking status: Never Smoker   . Smokeless tobacco: Never Used  . Alcohol Use: No  . Drug Use: No  . Sexual Activity: Not on file   Other Topics Concern  . Not on file   Social History Narrative   Patient is widowed.    Patient has 3 children.    Patient is retired.      PHYSICAL EXAM  Filed Vitals:   02/27/14 1357  BP: 139/69  Pulse: 75  Height: 5\' 3"  (1.6 m)  Weight: 130 lb (58.968 kg)   Body mass index is 23.03 kg/(m^2).  Generalized: Well developed, in no acute distress  Musculoskeletal: No deformity   Neurological examination   Mentation: Alert oriented to time, place, history taking. MMSE 30/30. AFT 9. Follows all commands speech and language fluent  Cranial nerve II-XII: Pupils were equal round reactive to  light, extraocular movements were full, visual field were full on confrontational test. Facial sensation and strength were normal. hearing was intact to finger rubbing bilaterally. Uvula tongue midline. head turning and shoulder shrug were normal and symmetric.Tongue protrusion into cheek strength was normal. Motor: normal bulk and tone, full strength in the BUE, BLE,  No focal weakness Sensory: normal and symmetric to  light touch, pinprick, and  vibration  Coordination: finger-nose-finger, heel-to-shin bilaterally, no dysmetria Reflexes: 1+ upper lower and symmetric,  plantar responses were flexor bilaterally. Gait and Station: Rising up from seated position without assistance, normal stance,  moderate stride, good arm swing, smooth turning, able to perform tiptoe, and heel walking without difficulty. Tandem gait is only unsteady  DIAGNOSTIC DATA (LABS, IMAGING, TESTING) -   ASSESSMENT AND PLAN  80 y.o. year old female  has a past medical history of Trigeminal neuralgia; Altered mental status (08/24/2012); here to follow up. Memory Score is 30 out of 30. She is a patient of Dr. Krista Blue who is out of the office.   Continue gabapentin 400 mg twice daily Followup in 6-8 months Dennie Bible, [facility], [facility], APRN  [facility] [address], [place], [place] [address] [phone]

## 2014-02-27 NOTE — Patient Instructions (Signed)
Continue gabapentin 400 mg twice daily Memory score of 30 out of 30 Followup in 6-8 months

## 2014-03-06 NOTE — Telephone Encounter (Signed)
Closing encounter

## 2014-03-11 ENCOUNTER — Telehealth: Payer: Self-pay | Admitting: Neurology

## 2014-03-11 NOTE — Telephone Encounter (Signed)
Telephone call to daughter. She says that primary care told her to take 300mg  in the morning 200mg  in the afternoon and 300mg  at night which would still be 400 mg twice daily. This was just started today and the daughter will call back in a few days if this does not work we can increase the medication. She is okay with this plan. Made aware that we can probably manage this over the phone

## 2014-03-11 NOTE — Telephone Encounter (Signed)
Pt's daughter [name] called states pt is having another one of those attacks of Trigeminal neuralgia and said it has caused her to have a heart attack in the past. [name] would like for someone to please call her to get her mom in to see a Dr. concerning this matter. Thanks

## 2014-03-12 ENCOUNTER — Telehealth: Payer: Self-pay | Admitting: *Deleted

## 2014-03-12 MED ORDER — GABAPENTIN 600 MG PO TABS: 600.0000 mg | ORAL_TABLET | Freq: Two times a day (BID) | ORAL | Status: DC

## 2014-03-12 NOTE — Telephone Encounter (Signed)
Telephone call to daughter,  medication changed by primary care did not help yesterday. Mother is still having facial pain and some tearing around the right eye. Patient's mother is currently in assisted living. She claims her mother slurred speech is coming from the fact that she is slow to speak due to her facial pain. Will increase gabapentin. She is agreeable to that. Order faxed to [facility], [phone]

## 2014-03-13 ENCOUNTER — Telehealth: Payer: Self-pay | Admitting: Nurse Practitioner

## 2014-03-13 NOTE — Telephone Encounter (Signed)
I called back.  Got no answer.  Left message relaying info from Carolyn's note on 04/09.  Asked them to call us back if anything further was needed. (Please see previous note regarding dose change.)

## 2014-03-13 NOTE — Telephone Encounter (Signed)
Magda Paganini, pharmacist at [facility] called for this patient. She states that there is duplicate medication prescribed for this patient and she would like clarification.  Cecille Rubin prescribed gabapentin (NEURONTIN) 600 MG tablet -Take 1 tablet (600 mg total) by mouth 2 (two) times daily. - Oral.  Another doctor had previously prescribed the name brand Neurontin 300 mg in the AM, 200 mg at 2 pm, and 300 mg at bedtime.  Please call to advise.  Thank you

## 2014-03-19 ENCOUNTER — Other Ambulatory Visit: Payer: Self-pay | Admitting: Nurse Practitioner

## 2014-03-19 MED ORDER — GABAPENTIN 600 MG PO TABS: 600.0000 mg | ORAL_TABLET | Freq: Three times a day (TID) | ORAL | Status: DC

## 2014-03-19 NOTE — Telephone Encounter (Signed)
Facility is requesting new Rx.  Must be signed and faxed, they cannot accept E-Rx.  Thank you.

## 2014-03-19 NOTE — Telephone Encounter (Signed)
Pt's daughter [name] called states pt needs to have her medication increased (gabapentin (NEURONTIN) 600 MG tablet -Take 1 tablet (600 mg total) by mouth 2 (two) times daily. - Oral.  Please see previous notes concerning this medication and advised for [place] to faxed to [facility], [phone]. Please call [name] for any other information needed. Thanks

## 2014-03-19 NOTE — Telephone Encounter (Signed)
I spoke with Angela Franco.  She said her mother has run out of medication because the facility told her we only sent a 4 day supply.  I advised our records show we sent #60 (30 days) with additional refills.   She also says her mother is still in pain and she would like to know if the dose should be increased further.  Please advise.  Thank you.

## 2014-03-19 NOTE — Telephone Encounter (Signed)
I called the daughter, will increase Gabapentin to 3times daily at 8am, 2pm and 8pm.

## 2014-03-19 NOTE — Addendum Note (Signed)
Addended by: Evlyn Courier on: 03/19/2014 02:39 PM   Modules accepted: Orders

## 2014-10-21 ENCOUNTER — Encounter: Payer: Self-pay | Admitting: Neurology

## 2014-10-22 ENCOUNTER — Ambulatory Visit (INDEPENDENT_AMBULATORY_CARE_PROVIDER_SITE_OTHER): Payer: Medicare Other | Admitting: Nurse Practitioner

## 2014-10-22 ENCOUNTER — Encounter: Payer: Self-pay | Admitting: Nurse Practitioner

## 2014-10-22 VITALS — BP 132/65 | HR 84 | Resp 16 | Ht 62.5 in | Wt 139.6 lb

## 2014-10-22 DIAGNOSIS — G500 Trigeminal neuralgia: Secondary | ICD-10-CM

## 2014-10-22 MED ORDER — GABAPENTIN 600 MG PO TABS: 600.0000 mg | ORAL_TABLET | Freq: Three times a day (TID) | ORAL | Status: DC

## 2014-10-22 NOTE — Progress Notes (Signed)
[facility]  PATIENT: Damiya Sandefur DOB: March 07, 1929   REASON FOR VISIT: Follow-up for trigeminal neuralgia    HISTORY OF PRESENT ILLNESS:Ms. Oman, 80 year old female returns for followup. She has a history of facial pain and mild memory loss. Her memory score was 30/30 at last visit.  She had previously been on carbamazepine but due to  sodium levels in the low 120s her carbamazepine has been discontinued and she is on gabapentin for her facial pain with good control. She returns today with her daughter. She is currently in an assisted living. She exercises on a regular basis. She has no new complaints. She returns for reevaluation. She has attempted to taper the gabapentin with return of the facial pain. She was treated for pneumonia 2 months ago   HISTORY: She has history of facial pain and history of memory loss. She is doing well with her facial pain and her memory loss is stable. Her primary care physician Dr. Tamala Julian called to the office 08/01/2013 and spoke to Dr. Krista Blue about decreasing carbamazepine due to sodium levels in the low 120s. She continues to take one carbamazepine daily She is also on gabapentin. She is accompanied by her son today. She is currently at an assisted living. She has no new complaints   REVIEW OF SYSTEMS: Full 14 system review of systems performed and notable only for those listed, all others are neg:  Constitutional: N/A  Cardiovascular: N/A  Ear/Nose/Throat: N/A  Skin: N/A  Eyes: N/A  Respiratory: Cough Gastroitestinal: N/A  Hematology/Lymphatic: N/A  Endocrine: N/A Musculoskeletal:N/A  Allergy/Immunology: N/A  Neurological: N/A Psychiatric: N/A Sleep : NA   ALLERGIES: Allergies  Allergen Reactions  . Penicillins Hives    HOME MEDICATIONS: Outpatient Prescriptions Prior to Visit  Medication Sig Dispense Refill  . apixaban (ELIQUIS) 2.5 MG TABS tablet Take 2.5 mg by mouth 2 (two) times daily.    . carvedilol (COREG)  6.25 MG tablet Take 1 tablet (6.25 mg total) by mouth 2 (two) times daily with a meal. 60 tablet 0  . docusate sodium 100 MG CAPS Take 200 mg by mouth at bedtime as needed. 60 capsule 0  . ferrous sulfate 325 (65 FE) MG tablet Take 1 tablet (325 mg total) by mouth 3 (three) times daily with meals. 90 tablet 3  . furosemide (LASIX) 80 MG tablet Take 40 mg by mouth 3 (three) times a week.    . gabapentin (NEURONTIN) 600 MG tablet Take 1 tablet (600 mg total) by mouth 3 (three) times daily. Please dose at 8am, 2pm and 8pm 90 tablet 3  . ibuprofen (ADVIL,MOTRIN) 400 MG tablet Take 400 mg by mouth every 6 (six) hours as needed for pain.    [name] losartan (COZAAR) 50 MG tablet Take 1 tablet (50 mg total) by mouth daily. 30 tablet 0  . oxycodone (OXY-IR) 5 MG capsule Take 5 mg by mouth every 4 (four) hours as needed.    Vladimir Faster Glycol-Propyl Glycol (SYSTANE) 0.4-0.3 % SOLN Apply 5 mg to eye. 1 drop left eye, right or both eyes PRN    . potassium chloride SA (K-DUR,KLOR-CON) 20 MEQ tablet Take 1 tablet (20 mEq total) by mouth 2 (two) times daily. 60 tablet 0  . simvastatin (ZOCOR) 10 MG tablet Take 5 mg by mouth daily.    . temazepam (RESTORIL) 15 MG capsule Take 15 mg by mouth at bedtime as needed.    . tiotropium (SPIRIVA) 18 MCG inhalation capsule Place 18 mcg into inhaler  and inhale daily.     No facility-administered medications prior to visit.    PAST MEDICAL HISTORY: Past Medical History  Diagnosis Date  . COPD (chronic obstructive pulmonary disease)     Never smoked. Questionable diagnosis.  . Atrial fibrillation   . Osteopenia   . Insomnia   . Hyponatremia   . Hypertension   . UTI (lower urinary tract infection)   . Bursitis of hip   . Trigeminal neuralgia   . Altered mental status 08/24/2012  . Anemia, with significant drop in H/H 08/24/2012  . CHF (congestive heart failure)   . Heart disease   . Acute facial pain   . LBBB (left bundle branch block)   . Nonischemic cardiomyopathy    . Dyslipidemia     PAST SURGICAL HISTORY: Past Surgical History  Procedure Laterality Date  . Appendectomy    . Tonsillectomy and adenoidectomy    . Cardiac catheterization  07/04/2012    normal coronaries, EF 30-35%, elevated R & L heart pressures, reduced CO, mild pulm HTN (Dr. K. Mali Hilty)  . Transthoracic echocardiogram  2013    mild conc LVH, RV mildly dilated; LA severely dilated; RA mod dilated; mild-mod mitral annular calcif, calcif of anterior & posterior MV leaflet, mod MR; mod-severe TR with RSVP 30-33mmHg; mild calcif of AV leaflets and mod aortic regurg; mild pulm valve regurg; pericardial effusion (right)  . Nm myocar perf wall motion  2011    lexiscan myoview - normal LV systolic function w/normal wall motion, no evidence of scar/ischemia    FAMILY HISTORY: Family History  Problem Relation Age of Onset  . Stroke Father   . Heart attack Father   . Heart attack Brother   . Heart attack Brother   . Alzheimer's disease Mother     SOCIAL HISTORY: History   Social History  . Marital Status: Widowed    Spouse Name: N/A    Number of Children: 3  . Years of Education: 12   Occupational History  . Not on file.   Social History Main Topics  . Smoking status: Never Smoker   . Smokeless tobacco: Never Used  . Alcohol Use: No  . Drug Use: No  . Sexual Activity: Not on file   Other Topics Concern  . Not on file   Social History Narrative   Patient is widowed.    Patient has 3 children.    Patient is retired.      PHYSICAL EXAM  Filed Vitals:   10/22/14 1437  BP: 132/65  Pulse: 84  Resp: 16  Height: 5' 2.5" (1.588 m)  Weight: 139 lb 9.6 oz (63.322 kg)   Body mass index is 25.11 kg/(m^2). Generalized: Well developed, in no acute distress  Musculoskeletal: No deformity   Neurological examination   Mentation: Alert oriented to time, place, history taking.  Follows all commands speech and language fluent  Cranial nerve II-XII: Pupils were equal  round reactive to light, extraocular movements were full, visual field were full on confrontational test. Facial sensation and strength were normal. hearing was intact to finger rubbing bilaterally. Uvula tongue midline. head turning and shoulder shrug were normal and symmetric.Tongue protrusion into cheek strength was normal. Motor: normal bulk and tone, full strength in the BUE, BLE, No focal weakness Sensory: normal and symmetric to light touch, pinprick, and vibration  Coordination: finger-nose-finger, heel-to-shin bilaterally, no dysmetria Reflexes: 1+ upper lower and symmetric, plantar responses were flexor bilaterally. Gait and Station: Rising up from seated  position without assistance, normal stance, moderate stride, good arm swing, smooth turning, able to perform tiptoe, and heel walking without difficulty. Tandem gait is mildly  unsteady  DIAGNOSTIC DATA (LABS, IMAGING, TESTING) -    ASSESSMENT AND PLAN  80 y.o. year old female  has a past medical history of Trigeminal neuralgia; Acute facial pain; here to follow-up. Her facial pain is well controlled on gabapentin  Continue gabapentin 600 mg 3 times daily Follow-up in 6-8 months next visit with Dr. Luan Pulling, [facility], [facility], [facility] [address] [place], [place] [address] [phone]

## 2014-10-22 NOTE — Patient Instructions (Signed)
Per assisted living sheet

## 2014-10-27 ENCOUNTER — Encounter: Payer: Self-pay | Admitting: Neurology

## 2014-11-03 NOTE — Progress Notes (Signed)
I agree above plan. 

## 2014-11-12 ENCOUNTER — Encounter (HOSPITAL_COMMUNITY): Payer: Self-pay | Admitting: Internal Medicine

## 2015-01-06 ENCOUNTER — Telehealth: Payer: Self-pay | Admitting: Internal Medicine

## 2015-01-07 NOTE — Telephone Encounter (Signed)
Close encounter 

## 2015-02-22 ENCOUNTER — Ambulatory Visit: Payer: Medicare Other | Admitting: Internal Medicine

## 2015-02-25 ENCOUNTER — Ambulatory Visit (INDEPENDENT_AMBULATORY_CARE_PROVIDER_SITE_OTHER): Payer: Medicare Other | Admitting: Internal Medicine

## 2015-02-25 ENCOUNTER — Encounter: Payer: Self-pay | Admitting: Internal Medicine

## 2015-02-25 VITALS — BP 148/88 | HR 77 | Ht 63.0 in | Wt 143.1 lb

## 2015-02-25 DIAGNOSIS — I4891 Unspecified atrial fibrillation: Secondary | ICD-10-CM | POA: Diagnosis not present

## 2015-02-25 DIAGNOSIS — J438 Other emphysema: Secondary | ICD-10-CM

## 2015-02-25 DIAGNOSIS — I429 Cardiomyopathy, unspecified: Secondary | ICD-10-CM | POA: Diagnosis not present

## 2015-02-25 DIAGNOSIS — I482 Chronic atrial fibrillation, unspecified: Secondary | ICD-10-CM

## 2015-02-25 DIAGNOSIS — I272 Pulmonary hypertension, unspecified: Secondary | ICD-10-CM

## 2015-02-25 DIAGNOSIS — I447 Left bundle-branch block, unspecified: Secondary | ICD-10-CM | POA: Diagnosis not present

## 2015-02-25 DIAGNOSIS — R0602 Shortness of breath: Secondary | ICD-10-CM

## 2015-02-25 DIAGNOSIS — I270 Primary pulmonary hypertension: Secondary | ICD-10-CM

## 2015-02-25 DIAGNOSIS — Z79899 Other long term (current) drug therapy: Secondary | ICD-10-CM

## 2015-02-25 DIAGNOSIS — I428 Other cardiomyopathies: Secondary | ICD-10-CM

## 2015-02-25 NOTE — Patient Instructions (Signed)
Your physician has requested that you have an echocardiogram. Echocardiography is a painless test that uses sound waves to create images of your heart. It provides your doctor with information about the size and shape of your heart and how well your heart's chambers and valves are working. This procedure takes approximately one hour. There are no restrictions for this procedure.   Please have lab work today - first floor - [id number]   Your physician wants you to follow-up in: 6 months with Dr. Debara Pickett. You will receive a reminder letter in the mail two months in advance. If you don't receive a letter, please call our office to schedule the follow-up appointment.

## 2015-02-25 NOTE — Progress Notes (Signed)
OFFICE NOTE  Chief Complaint:  Routine follow-up  Primary Care Physician: Pcp Not In System  HPI:  Angela Franco is an 81 year old female who was recently hospitalized for hyponatremia and atrial fibrillation. She had a cardiac catheterization last summer that showed an EF of 30% to 35%; however, this improved to 40% to 45% with a repeat echocardiogram. She also has a chronic left bundle branch block. She has had problems with encephalopathy and was on Tegretol in the past. These symptoms have improved; however, still has persistent atrial fibrillation. In addition, during her hospitalization, it was complicated by anemia and was found to be iron deficient. She also had a GI workup, which did not reveal any bleeding source. She was taken off of warfarin, which she was taking at the time, but was never restarted. Overall, she seems much better and in fact is not complaining of nearly as many problems since her hospitalization. It is also important to remember that she had a trigeminal neuralgia as well that stopped.   At her last office visit I recommended starting Eliquis which she has been taking without any bleeding problems. She is maintaining atrial fibrillation today which is probably permanent A. fib at this point. She denies any worsening shortness of breath or chest pain.  I saw Angela Franco back in the office today. She seems to be doing fairly well. She denies any worsening shortness of breath, weight gain or swelling. She is tolerating Eliquis without any bleeding, occasions. She takes Lasix 3 times a week. She was recently told by her primary care provider that she does have some mild chronic kidney disease. This needs to be reassessed. She was also supposed to have an echocardiogram prior to this visit as her last EF was 40-45% in 2013.  PMHx:  Past Medical History  Diagnosis Date  . COPD (chronic obstructive pulmonary disease)     Never smoked. Questionable diagnosis.  .  Atrial fibrillation   . Osteopenia   . Insomnia   . Hyponatremia   . Hypertension   . UTI (lower urinary tract infection)   . Bursitis of hip   . Trigeminal neuralgia   . Altered mental status 08/24/2012  . Anemia, with significant drop in H/H 08/24/2012  . CHF (congestive heart failure)   . Heart disease   . Acute facial pain   . LBBB (left bundle branch block)   . Nonischemic cardiomyopathy   . Dyslipidemia     Past Surgical History  Procedure Laterality Date  . Appendectomy    . Tonsillectomy and adenoidectomy    . Cardiac catheterization  07/04/2012    normal coronaries, EF 30-35%, elevated R & L heart pressures, reduced CO, mild pulm HTN (Dr. K. Mali Hilty)  . Transthoracic echocardiogram  2013    mild conc LVH, RV mildly dilated; LA severely dilated; RA mod dilated; mild-mod mitral annular calcif, calcif of anterior & posterior MV leaflet, mod MR; mod-severe TR with RSVP 30-71mmHg; mild calcif of AV leaflets and mod aortic regurg; mild pulm valve regurg; pericardial effusion (right)  . Nm myocar perf wall motion  2011    lexiscan myoview - normal LV systolic function w/normal wall motion, no evidence of scar/ischemia  . Left and right heart catheterization with coronary angiogram N/A 07/04/2012    Procedure: LEFT AND RIGHT HEART CATHETERIZATION WITH CORONARY ANGIOGRAM;  Surgeon: Pixie Casino, MD;  Location: [facility] CATH LAB;  Service: Cardiovascular;  Laterality: N/A;    FAMHx:  Family  History  Problem Relation Age of Onset  . Stroke Father   . Heart attack Father   . Heart attack Brother   . Heart attack Brother   . Alzheimer's disease Mother     SOCHx:   reports that she has never smoked. She has never used smokeless tobacco. She reports that she does not drink alcohol or use illicit drugs.  ALLERGIES:  Allergies  Allergen Reactions  . Penicillins Hives    ROS: A comprehensive review of systems was negative.  HOME MEDS: Current Outpatient Prescriptions    Medication Sig Dispense Refill  . apixaban (ELIQUIS) 2.5 MG TABS tablet Take 2.5 mg by mouth 2 (two) times daily.    . Calcium Carbonate-Vitamin D (CALCIUM + D PO) Take by mouth daily.    . carvedilol (COREG) 6.25 MG tablet Take 1 tablet (6.25 mg total) by mouth 2 (two) times daily with a meal. 60 tablet 0  . docusate sodium 100 MG CAPS Take 200 mg by mouth at bedtime as needed. 60 capsule 0  . ferrous sulfate 325 (65 FE) MG tablet Take 325 mg by mouth daily with breakfast.    . gabapentin (NEURONTIN) 600 MG tablet Take 1 tablet (600 mg total) by mouth 3 (three) times daily. Please dose at 8am, 2pm and 8pm 90 tablet 6  . ibuprofen (ADVIL,MOTRIN) 400 MG tablet Take 400 mg by mouth every 6 (six) hours as needed for pain.    [name] losartan (COZAAR) 50 MG tablet Take 1 tablet (50 mg total) by mouth daily. 30 tablet 0  . oxycodone (OXY-IR) 5 MG capsule Take 5 mg by mouth every 4 (four) hours as needed.    . potassium chloride SA (K-DUR,KLOR-CON) 20 MEQ tablet Take 1 tablet (20 mEq total) by mouth 2 (two) times daily. 60 tablet 0  . simvastatin (ZOCOR) 10 MG tablet Take 5 mg by mouth daily.    . temazepam (RESTORIL) 15 MG capsule Take 15 mg by mouth at bedtime as needed.    . tiotropium (SPIRIVA) 18 MCG inhalation capsule Place 18 mcg into inhaler and inhale daily.    . furosemide (LASIX) 80 MG tablet Take 80 mg by mouth 3 (three) times a week.      No current facility-administered medications for this visit.    LABS/IMAGING: No results found for this or any previous visit (from the past 48 hour(s)). No results found.  VITALS: BP 148/88 mmHg  Pulse 77  Ht 5\' 3"  (1.6 m)  Wt 143 lb 1.6 oz (64.91 kg)  BMI 25.36 kg/m2  EXAM: General appearance: alert and no distress Neck: no carotid bruit and no JVD Lungs: clear to auscultation bilaterally Heart: irregularly irregular rhythm Abdomen: soft, non-tender; bowel sounds normal; no masses,  no organomegaly Extremities: edema trace and venous stasis  dermatitis noted Pulses: 2+ and symmetric Skin: Skin color, texture, turgor normal. No rashes or lesions Neurologic: Grossly normal Psych: Mood, affect normal  EKG: Atrial fibrillation at 77, left bundle branch block (chronic)  ASSESSMENT: 1. Permanent atrial fibrillation - on Eliquis 2. Left bundle Branch block 3. Ischemic cardiomyopathy EF 40-45% 4. Dyslipidemia  PLAN: 1.   Angela Franco is doing well without new heart failure symptoms. She continues to be active and exercises weekly. This been no weight gain or worsening swelling. Would like to recheck labs including a bmet and BNP. We'll also go ahead and recheck echocardiogram as it's been 3 years since her last study. She seems to be tolerating Eliquis without  any bleeding complications. Her daughter had some question about why she is on Spiriva, because they feel that she was prescribed that medicine for heart failure. Clearly is not indicated for that but she may have had some concomitant COPD. There clearly is x-ray evidence for COPD although she was never a smoker. She may need to have pulmonary function testing which can be arranged through her primary care provider to sort out whether or not she needs to be on Spiriva. She shortly does have pulmonary arterial hypertension. Which could be a sign of underlying COPD.  Pixie Casino, MD, [facility] Attending Cardiologist [facility]  HILTY,Kenneth C 02/25/2015, 4:35 PM

## 2015-02-26 LAB — BASIC METABOLIC PANEL
BUN: 32 mg/dL — AB (ref 6–23)
CHLORIDE: 89 meq/L — AB (ref 96–112)
CO2: 26 meq/L (ref 19–32)
Calcium: 9.4 mg/dL (ref 8.4–10.5)
Creat: 1.73 mg/dL — ABNORMAL HIGH (ref 0.50–1.10)
Glucose, Bld: 89 mg/dL (ref 70–99)
POTASSIUM: 4.7 meq/L (ref 3.5–5.3)
Sodium: 128 mEq/L — ABNORMAL LOW (ref 135–145)

## 2015-02-26 LAB — BRAIN NATRIURETIC PEPTIDE: Brain Natriuretic Peptide: 45.1 pg/mL (ref 0.0–100.0)

## 2015-03-01 ENCOUNTER — Other Ambulatory Visit: Payer: Self-pay | Admitting: *Deleted

## 2015-03-01 DIAGNOSIS — R799 Abnormal finding of blood chemistry, unspecified: Secondary | ICD-10-CM

## 2015-03-01 DIAGNOSIS — R7989 Other specified abnormal findings of blood chemistry: Secondary | ICD-10-CM

## 2015-03-10 ENCOUNTER — Ambulatory Visit (HOSPITAL_COMMUNITY)
Admission: RE | Admit: 2015-03-10 | Discharge: 2015-03-10 | Disposition: A | Discharge status: Home / Self Care | Payer: Medicare Other | Source: Ambulatory Visit | Attending: Internal Medicine | Admitting: Internal Medicine

## 2015-03-10 DIAGNOSIS — I429 Cardiomyopathy, unspecified: Secondary | ICD-10-CM | POA: Diagnosis not present

## 2015-03-10 DIAGNOSIS — I4891 Unspecified atrial fibrillation: Secondary | ICD-10-CM | POA: Insufficient documentation

## 2015-03-10 NOTE — Progress Notes (Signed)
2D Echo Performed 03/10/2015    Angela Franco, RCS

## 2015-05-20 ENCOUNTER — Encounter (HOSPITAL_COMMUNITY): Payer: Self-pay | Admitting: *Deleted

## 2015-05-20 ENCOUNTER — Emergency Department (HOSPITAL_COMMUNITY)
Admission: EM | Admit: 2015-05-20 | Discharge: 2015-05-20 | Disposition: A | Discharge status: Home / Self Care | Payer: Medicare Other | Attending: Emergency Medicine | Admitting: Emergency Medicine

## 2015-05-20 ENCOUNTER — Emergency Department (HOSPITAL_COMMUNITY): Payer: Medicare Other

## 2015-05-20 ENCOUNTER — Telehealth: Payer: Self-pay | Admitting: Internal Medicine

## 2015-05-20 DIAGNOSIS — R0789 Other chest pain: Secondary | ICD-10-CM | POA: Diagnosis not present

## 2015-05-20 DIAGNOSIS — D649 Anemia, unspecified: Secondary | ICD-10-CM | POA: Diagnosis not present

## 2015-05-20 DIAGNOSIS — Z87440 Personal history of urinary (tract) infections: Secondary | ICD-10-CM | POA: Diagnosis not present

## 2015-05-20 DIAGNOSIS — I509 Heart failure, unspecified: Secondary | ICD-10-CM | POA: Diagnosis not present

## 2015-05-20 DIAGNOSIS — I4891 Unspecified atrial fibrillation: Secondary | ICD-10-CM | POA: Diagnosis not present

## 2015-05-20 DIAGNOSIS — R630 Anorexia: Secondary | ICD-10-CM | POA: Diagnosis not present

## 2015-05-20 DIAGNOSIS — Z9889 Other specified postprocedural states: Secondary | ICD-10-CM | POA: Diagnosis not present

## 2015-05-20 DIAGNOSIS — E785 Hyperlipidemia, unspecified: Secondary | ICD-10-CM | POA: Insufficient documentation

## 2015-05-20 DIAGNOSIS — N189 Chronic kidney disease, unspecified: Secondary | ICD-10-CM | POA: Diagnosis not present

## 2015-05-20 DIAGNOSIS — Z79899 Other long term (current) drug therapy: Secondary | ICD-10-CM | POA: Insufficient documentation

## 2015-05-20 DIAGNOSIS — R5383 Other fatigue: Secondary | ICD-10-CM | POA: Diagnosis not present

## 2015-05-20 DIAGNOSIS — Z880 Allergy status to penicillin: Secondary | ICD-10-CM | POA: Diagnosis not present

## 2015-05-20 DIAGNOSIS — G4700 Insomnia, unspecified: Secondary | ICD-10-CM | POA: Insufficient documentation

## 2015-05-20 DIAGNOSIS — J449 Chronic obstructive pulmonary disease, unspecified: Secondary | ICD-10-CM | POA: Insufficient documentation

## 2015-05-20 DIAGNOSIS — R112 Nausea with vomiting, unspecified: Secondary | ICD-10-CM | POA: Diagnosis not present

## 2015-05-20 DIAGNOSIS — I129 Hypertensive chronic kidney disease with stage 1 through stage 4 chronic kidney disease, or unspecified chronic kidney disease: Secondary | ICD-10-CM | POA: Diagnosis not present

## 2015-05-20 DIAGNOSIS — R079 Chest pain, unspecified: Secondary | ICD-10-CM | POA: Diagnosis present

## 2015-05-20 DIAGNOSIS — E860 Dehydration: Secondary | ICD-10-CM | POA: Insufficient documentation

## 2015-05-20 LAB — CBC
HEMATOCRIT: 34.5 % — AB (ref 36.0–46.0)
Hemoglobin: 11.2 g/dL — ABNORMAL LOW (ref 12.0–15.0)
MCH: 31.9 pg (ref 26.0–34.0)
MCHC: 32.5 g/dL (ref 30.0–36.0)
MCV: 98.3 fL (ref 78.0–100.0)
Platelets: 179 10*3/uL (ref 150–400)
RBC: 3.51 MIL/uL — AB (ref 3.87–5.11)
RDW: 13.2 % (ref 11.5–15.5)
WBC: 5.9 10*3/uL (ref 4.0–10.5)

## 2015-05-20 LAB — COMPREHENSIVE METABOLIC PANEL
ALBUMIN: 3.5 g/dL (ref 3.5–5.0)
ALT: 19 U/L (ref 14–54)
AST: 30 U/L (ref 15–41)
Alkaline Phosphatase: 73 U/L (ref 38–126)
Anion gap: 7 (ref 5–15)
BUN: 52 mg/dL — ABNORMAL HIGH (ref 6–20)
CALCIUM: 9.3 mg/dL (ref 8.9–10.3)
CO2: 26 mmol/L (ref 22–32)
Chloride: 107 mmol/L (ref 101–111)
Creatinine, Ser: 2.09 mg/dL — ABNORMAL HIGH (ref 0.44–1.00)
GFR calc Af Amer: 24 mL/min — ABNORMAL LOW (ref >60)
GFR, EST NON AFRICAN AMERICAN: 21 mL/min — AB (ref >60)
GLUCOSE: 88 mg/dL (ref 65–99)
POTASSIUM: 4.9 mmol/L (ref 3.5–5.1)
SODIUM: 140 mmol/L (ref 135–145)
Total Bilirubin: 0.9 mg/dL (ref 0.3–1.2)
Total Protein: 8.1 g/dL (ref 6.5–8.1)

## 2015-05-20 LAB — TROPONIN I

## 2015-05-20 LAB — I-STAT TROPONIN, ED: TROPONIN I, POC: 0.01 ng/mL (ref 0.00–0.08)

## 2015-05-20 LAB — LIPASE, BLOOD: LIPASE: 37 U/L (ref 22–51)

## 2015-05-20 MED ORDER — OMEPRAZOLE 20 MG PO CPDR: 20.0000 mg | DELAYED_RELEASE_CAPSULE | Freq: Every day | ORAL | Status: DC

## 2015-05-20 MED ORDER — SODIUM CHLORIDE 0.9 % IV BOLUS (SEPSIS)
500.0000 mL | Freq: Once | INTRAVENOUS | Status: AC
  Administered 2015-05-20: 500 mL via INTRAVENOUS

## 2015-05-20 MED ORDER — SODIUM CHLORIDE 0.9 % IV BOLUS (SEPSIS)
250.0000 mL | Freq: Once | INTRAVENOUS | Status: AC
  Administered 2015-05-20: 250 mL via INTRAVENOUS

## 2015-05-20 MED ORDER — SODIUM CHLORIDE 0.9 % IV BOLUS (SEPSIS): 500.0000 mL | Freq: Once | INTRAVENOUS | Status: DC

## 2015-05-20 NOTE — Discharge Instructions (Signed)
Chest Pain (Nonspecific) Stop taking your lorsartan. Follow up with your doctor for a recheck of your kidney function this week. Return to the ED if you develop chest pain, SOB, or any other concerns. It is often hard to give a specific diagnosis for the cause of chest pain. There is always a chance that your pain could be related to something serious, such as a heart attack or a blood clot in the lungs. You need to follow up with your health care provider for further evaluation. CAUSES   Heartburn.  Pneumonia or bronchitis.  Anxiety or stress.  Inflammation around your heart (pericarditis) or lung (pleuritis or pleurisy).  A blood clot in the lung.  A collapsed lung (pneumothorax). It can develop suddenly on its own (spontaneous pneumothorax) or from trauma to the chest.  Shingles infection (herpes zoster virus). The chest wall is composed of bones, muscles, and cartilage. Any of these can be the source of the pain.  The bones can be bruised by injury.  The muscles or cartilage can be strained by coughing or overwork.  The cartilage can be affected by inflammation and become sore (costochondritis). DIAGNOSIS  Lab tests or other studies may be needed to find the cause of your pain. Your health care provider may have you take a test called an ambulatory electrocardiogram (ECG). An ECG records your heartbeat patterns over a 24-hour period. You may also have other tests, such as:  Transthoracic echocardiogram (TTE). During echocardiography, sound waves are used to evaluate how blood flows through your heart.  Transesophageal echocardiogram (TEE).  Cardiac monitoring. This allows your health care provider to monitor your heart rate and rhythm in real time.  Holter monitor. This is a portable device that records your heartbeat and can help diagnose heart arrhythmias. It allows your health care provider to track your heart activity for several days, if needed.  Stress tests by exercise  or by giving medicine that makes the heart beat faster. TREATMENT   Treatment depends on what may be causing your chest pain. Treatment may include:  Acid blockers for heartburn.  Anti-inflammatory medicine.  Pain medicine for inflammatory conditions.  Antibiotics if an infection is present.  You may be advised to change lifestyle habits. This includes stopping smoking and avoiding alcohol, caffeine, and chocolate.  You may be advised to keep your head raised (elevated) when sleeping. This reduces the chance of acid going backward from your stomach into your esophagus. Most of the time, nonspecific chest pain will improve within 2-3 days with rest and mild pain medicine.  HOME CARE INSTRUCTIONS   If antibiotics were prescribed, take them as directed. Finish them even if you start to feel better.  For the next few days, avoid physical activities that bring on chest pain. Continue physical activities as directed.  Do not use any tobacco products, including cigarettes, chewing tobacco, or electronic cigarettes.  Avoid drinking alcohol.  Only take medicine as directed by your health care provider.  Follow your health care provider's suggestions for further testing if your chest pain does not go away.  Keep any follow-up appointments you made. If you do not go to an appointment, you could develop lasting (chronic) problems with pain. If there is any problem keeping an appointment, call to reschedule. SEEK MEDICAL CARE IF:   Your chest pain does not go away, even after treatment.  You have a rash with blisters on your chest.  You have a fever. SEEK IMMEDIATE MEDICAL CARE IF:  You have increased chest pain or pain that spreads to your arm, neck, jaw, back, or abdomen.  You have shortness of breath.  You have an increasing cough, or you cough up blood.  You have severe back or abdominal pain.  You feel nauseous or vomit.  You have severe weakness.  You faint.  You have  chills. This is an emergency. Do not wait to see if the pain will go away. Get medical help at once. Call your local emergency services (911 in [place] Do not drive yourself to the hospital. MAKE SURE YOU:   Understand these instructions.  Will watch your condition.  Will get help right away if you are not doing well or get worse. Document Released: 08/30/2005 Document Revised: 11/25/2013 Document Reviewed: 06/25/2008 [facility] Patient Information 2015 [place], [place]. This information is not intended to replace advice given to you by your health care provider. Make sure you discuss any questions you have with your health care provider.

## 2015-05-20 NOTE — ED Notes (Signed)
Pt had chest pain today.  Pt was vomiting Sunday nite and is on fluid restriction from kidney doctor.  Pt had the chest pains while at lunch today with family.

## 2015-05-20 NOTE — ED Notes (Signed)
Pt A&OX4, ambulatory at d/c with steady gait, NAD 

## 2015-05-20 NOTE — Telephone Encounter (Signed)
Pt's daughter Jackelyn Poling is calling in wanting to be advised on what to do about her mother possibly being dehydrated due to her kidney doctor limiting her fluids. Please advise  Thanks

## 2015-05-20 NOTE — Telephone Encounter (Signed)
Received a call from patient's daughter [name].Stated mother had chest pain this morning.Stated she thinks mother is dehydrated and would like her seen today.Stated kidney Dr.restricted her fluid intake.Stated she just don't feel good.Stated she will go ahead and take her mother to [facility] ER.

## 2015-05-20 NOTE — ED Provider Notes (Signed)
CSN: [id number]     Arrival date & time 05/20/15  1438 History   First MD Initiated Contact with Patient 05/20/15 1604     Chief Complaint  Patient presents with  . Chest Pain     (Consider location/radiation/quality/duration/timing/severity/associated sxs/prior Treatment) HPI Comments: Patient presents with episode of lower sternal chest pain onset after she was eating a [place] sandwich. The pain lasted for a few minutes and is now resolved. It did not radiate. There is no shortness of breath, nausea, vomiting, fever or chills. No diaphoresis. Patient feels back to baseline now. She has not had chest pain like this in the past. Her daughter is concerned that she is dehydrated because she had several episodes of vomiting 5 days ago and is on fluid restriction because of her kidney function. She's had no further vomiting. No diarrhea or abdominal pain. No fever or cough. No focal weakness, numbness or tingling. She has a history of atrial fibrillation on a liquids. She denies any previous cardiac history.  The history is provided by the patient and a relative.    Past Medical History  Diagnosis Date  . COPD (chronic obstructive pulmonary disease)     Never smoked. Questionable diagnosis.  . Atrial fibrillation   . Osteopenia   . Insomnia   . Hyponatremia   . Hypertension   . UTI (lower urinary tract infection)   . Bursitis of hip   . Trigeminal neuralgia   . Altered mental status 08/24/2012  . Anemia, with significant drop in H/H 08/24/2012  . CHF (congestive heart failure)   . Heart disease   . Acute facial pain   . LBBB (left bundle branch block)   . Nonischemic cardiomyopathy   . Dyslipidemia    Past Surgical History  Procedure Laterality Date  . Appendectomy    . Tonsillectomy and adenoidectomy    . Cardiac catheterization  07/04/2012    normal coronaries, EF 30-35%, elevated R & L heart pressures, reduced CO, mild pulm HTN (Dr. K. Mali Hilty)  . Transthoracic echocardiogram   2013    mild conc LVH, RV mildly dilated; LA severely dilated; RA mod dilated; mild-mod mitral annular calcif, calcif of anterior & posterior MV leaflet, mod MR; mod-severe TR with RSVP 30-63mmHg; mild calcif of AV leaflets and mod aortic regurg; mild pulm valve regurg; pericardial effusion (right)  . Nm myocar perf wall motion  2011    lexiscan myoview - normal LV systolic function w/normal wall motion, no evidence of scar/ischemia  . Left and right heart catheterization with coronary angiogram N/A 07/04/2012    Procedure: LEFT AND RIGHT HEART CATHETERIZATION WITH CORONARY ANGIOGRAM;  Surgeon: Pixie Casino, MD;  Location: [facility] CATH LAB;  Service: Cardiovascular;  Laterality: N/A;   Family History  Problem Relation Age of Onset  . Stroke Father   . Heart attack Father   . Heart attack Brother   . Heart attack Brother   . Alzheimer's disease Mother    History  Substance Use Topics  . Smoking status: Never Smoker   . Smokeless tobacco: Never Used  . Alcohol Use: No   OB History    No data available     Review of Systems  Constitutional: Positive for activity change, appetite change and fatigue. Negative for fever.  HENT: Negative for congestion, nosebleeds, postnasal drip and tinnitus.   Respiratory: Positive for chest tightness. Negative for cough and shortness of breath.   Cardiovascular: Positive for chest pain.  Gastrointestinal: Positive for  nausea and vomiting. Negative for abdominal pain.  Genitourinary: Negative for dysuria, vaginal bleeding and vaginal discharge.  Musculoskeletal: Positive for myalgias and arthralgias.  Skin: Negative for rash.  Neurological: Negative for dizziness, weakness, light-headedness and headaches.  A complete 10 system review of systems was obtained and all systems are negative except as noted in the HPI and PMH.      Allergies  Penicillins  Home Medications   Prior to Admission medications   Medication Sig Start Date End Date Taking?  Authorizing Provider  apixaban (ELIQUIS) 2.5 MG TABS tablet Take 2.5 mg by mouth 2 (two) times daily.   Yes Historical Provider, MD  carvedilol (COREG) 6.25 MG tablet Take 1 tablet (6.25 mg total) by mouth 2 (two) times daily with a meal. 07/08/12  Yes Ernest Haber, MD  ferrous sulfate 325 (65 FE) MG tablet Take 325 mg by mouth daily with breakfast.   Yes Historical Provider, MD  furosemide (LASIX) 80 MG tablet Take 80 mg by mouth every Monday, Wednesday, and Friday.  07/08/12 05/20/15 Yes Novlet Thea Alken, MD  gabapentin (NEURONTIN) 600 MG tablet Take 1 tablet (600 mg total) by mouth 3 (three) times daily. Please dose at 8am, 2pm and 8pm 10/22/14  Yes Dennie Bible, NP  losartan (COZAAR) 50 MG tablet Take 1 tablet (50 mg total) by mouth daily. Patient taking differently: Take 50 mg by mouth every evening.  07/08/12  Yes Novlet Thea Alken, MD  potassium chloride SA (K-DUR,KLOR-CON) 20 MEQ tablet Take 1 tablet (20 mEq total) by mouth 2 (two) times daily. 07/08/12  Yes Ernest Haber, MD  simvastatin (ZOCOR) 10 MG tablet Take 5 mg by mouth daily at 6 PM.    Yes Historical Provider, MD  temazepam (RESTORIL) 15 MG capsule Take 15 mg by mouth at bedtime.    Yes Historical Provider, MD  tiotropium (SPIRIVA) 18 MCG inhalation capsule Place 18 mcg into inhaler and inhale daily.   Yes Historical Provider, MD  docusate sodium 100 MG CAPS Take 200 mg by mouth at bedtime as needed. 08/26/12   Orson Eva, MD  omeprazole (PRILOSEC) 20 MG capsule Take 1 capsule (20 mg total) by mouth daily. 05/20/15   Ezequiel Essex, MD   BP 139/64 mmHg  Pulse 82  Temp(Src) 97.8 F (36.6 C) (Oral)  Resp 21  SpO2 97% Physical Exam  Constitutional: She is oriented to person, place, and time. She appears well-developed and well-nourished. No distress.  HENT:  Head: Normocephalic and atraumatic.  Mouth/Throat: Oropharynx is clear and moist. No oropharyngeal exudate.  Eyes: Conjunctivae and EOM are normal. Pupils are equal, round,  and reactive to light.  Neck: Normal range of motion. Neck supple.  No meningismus.  Cardiovascular: Normal rate, regular rhythm, normal heart sounds and intact distal pulses.   No murmur heard. Pulmonary/Chest: Effort normal and breath sounds normal. No respiratory distress.  Abdominal: Soft. There is no tenderness. There is no rebound and no guarding.  Musculoskeletal: Normal range of motion. She exhibits no edema or tenderness.  Neurological: She is alert and oriented to person, place, and time. No cranial nerve deficit. She exhibits normal muscle tone. Coordination normal.  No ataxia on finger to nose bilaterally. No pronator drift. 5/5 strength throughout. CN 2-12 intact. Negative Romberg. Equal grip strength. Sensation intact. Gait is normal.   Skin: Skin is warm.  Psychiatric: She has a normal mood and affect. Her behavior is normal.  Nursing note and vitals reviewed.   ED Course  Procedures (including critical care time) Labs Review Labs Reviewed  CBC - Abnormal; Notable for the following:    RBC 3.51 (*)    Hemoglobin 11.2 (*)    HCT 34.5 (*)    All other components within normal limits  COMPREHENSIVE METABOLIC PANEL - Abnormal; Notable for the following:    BUN 52 (*)    Creatinine, Ser 2.09 (*)    GFR calc non Af Amer 21 (*)    GFR calc Af Amer 24 (*)    All other components within normal limits  LIPASE, BLOOD  TROPONIN I  I-STAT TROPOININ, ED    Imaging Review Dg Chest 2 View  05/20/2015   CLINICAL DATA:  Chest pain  EXAM: CHEST  2 VIEW  COMPARISON:  08/23/2012  FINDINGS: Cardiomegaly again noted. No acute infiltrate or pleural effusion. No pulmonary edema. Stable hyperinflation and chronic mild interstitial prominence. Osteopenia and mild degenerative changes thoracic spine.  IMPRESSION: No active disease.  Stable COPD.   Electronically Signed   By: Lahoma Crocker M.D.   On: 05/20/2015 15:36     EKG Interpretation   Date/Time:  Thursday May 20 2015 14:44:54  EDT Ventricular Rate:  75 PR Interval:    QRS Duration: 128 QT Interval:  416 QTC Calculation: 464 R Axis:   -114 Text Interpretation:  Atrial fibrillation Indeterminate axis Non-specific  intra-ventricular conduction block Cannot rule out Septal infarct , age  undetermined T wave abnormality, consider lateral ischemia Abnormal ECG No  significant change was found Confirmed by Wyvonnia Dusky  MD, Maeryn Mcgath [phone]) on  05/20/2015 4:19:11 PM      MDM   Final diagnoses:  Dehydration  Atypical chest pain  CKD (chronic kidney disease), unspecified stage   Episode of chest pain onset after eating. Now resolved. EKG with atrial fibrillation lateral T wave inversions similar to previous.  Patient with cardiac catheterization in 2013 that showed normal coronaries and reduced ejection fraction.  Patient mildly hemoconcentrated. Creatinine 2.0 from 1.7. She is given 500 L of IV fluid. His EKG is unchanged from previous. No further chest pain. Reassuring catheterization 2013.  Doubt ACS as cause of pain. We'll check serial troponins. She appears to be mildly dehydrated from her vomiting illness over the weekend.  Discussed with hospitalist Dr. Dyann Kief. He does not feel she needs admission. He recommends IV fluids in the ED, holding her losartan and following up with her nephrologist this week. He feels she can continue her lasix.  Second troponin negative. No further episodes of chest pain. Hold losartan.  FOllow up with PCP for recheck of kidney function this week. Return precautions discussed.  Ezequiel Essex, MD 05/21/15 [id number]

## 2015-06-14 ENCOUNTER — Ambulatory Visit (INDEPENDENT_AMBULATORY_CARE_PROVIDER_SITE_OTHER): Payer: Medicare Other | Admitting: Neurology

## 2015-06-14 ENCOUNTER — Encounter: Payer: Self-pay | Admitting: Neurology

## 2015-06-14 VITALS — BP 143/84 | HR 93 | Ht 63.0 in | Wt 140.0 lb

## 2015-06-14 DIAGNOSIS — G500 Trigeminal neuralgia: Secondary | ICD-10-CM | POA: Diagnosis not present

## 2015-06-14 MED ORDER — GABAPENTIN 600 MG PO TABS: 600.0000 mg | ORAL_TABLET | Freq: Three times a day (TID) | ORAL | Status: DC

## 2015-06-14 NOTE — Progress Notes (Signed)
Chief Complaint  Patient presents with  . Trigeminal Neuralgia    She feels her symptoms have been well contolled with gabapentin.  She has not had any flare-ups of pain.  She has been diagnosed with stage 3 kidney failure since last here.  She is being treated by Dr. Marval Regal.     [facility]  PATIENT: Angela Franco DOB: 07-21-1929  HISTORY OF PRESENT ILLNESS:Angela Franco, 81 year old female returns for followup. She has a history of facial pain and mild memory loss. Her memory score was 30/30 at last visit.  She had previously been on carbamazepine but due to  sodium levels in the low 120s her carbamazepine has been discontinued and she is on gabapentin for her facial pain with good control. She returns today with her daughter. She is currently in an assisted living. She exercises on a regular basis. She has no new complaints. She returns for reevaluation. She has attempted to taper the gabapentin with return of the facial pain. She was treated for pneumonia 2 months ago   HISTORY: She has history of facial pain and history of memory loss. She is doing well with her facial pain and her memory loss is stable. Her primary care physician Dr. Tamala Julian called to the office 08/01/2013 and spoke to Dr. Krista Blue about decreasing carbamazepine due to sodium levels in the low 120s. She continues to take one carbamazepine daily She is also on gabapentin. She is accompanied by her son today. She is currently at an assisted living. She has no new complaints    UPDATE June 14 2015: He is with her daughter at today's clinical visit, she is seeing nephrologist for worsening chronic renal insufficiency, she is taking Gabapentin  600mg  tid, there was no recurrent right facial pain,  She lives alone at independent living, still active, exercise regularly, no significant memory trouble,  She does not want changes of her gabapentin, despite worsening kidney function, because recurrent right facial  pain the past with lower dose of gabapentin,  REVIEW OF SYSTEMS: Full 14 system review of systems performed and notable only for those listed, all others are neg:  As above    ALLERGIES: Allergies  Allergen Reactions  . Penicillins Hives    HOME MEDICATIONS: Outpatient Prescriptions Prior to Visit  Medication Sig Dispense Refill  . apixaban (ELIQUIS) 2.5 MG TABS tablet Take 2.5 mg by mouth 2 (two) times daily.    . carvedilol (COREG) 6.25 MG tablet Take 1 tablet (6.25 mg total) by mouth 2 (two) times daily with a meal. 60 tablet 0  . docusate sodium 100 MG CAPS Take 200 mg by mouth at bedtime as needed. 60 capsule 0  . ferrous sulfate 325 (65 FE) MG tablet Take 325 mg by mouth daily with breakfast.    . gabapentin (NEURONTIN) 600 MG tablet Take 1 tablet (600 mg total) by mouth 3 (three) times daily. Please dose at 8am, 2pm and 8pm 90 tablet 6  . potassium chloride SA (K-DUR,KLOR-CON) 20 MEQ tablet Take 1 tablet (20 mEq total) by mouth 2 (two) times daily. 60 tablet 0  . simvastatin (ZOCOR) 10 MG tablet Take 5 mg by mouth daily at 6 PM.     . temazepam (RESTORIL) 15 MG capsule Take 15 mg by mouth at bedtime.     [name] tiotropium (SPIRIVA) 18 MCG inhalation capsule Place 18 mcg into inhaler and inhale daily.    . furosemide (LASIX) 80 MG tablet Take 80 mg by mouth every Monday, Wednesday,  and Friday.     . losartan (COZAAR) 50 MG tablet Take 1 tablet (50 mg total) by mouth daily. (Patient taking differently: Take 50 mg by mouth every evening. ) 30 tablet 0  . omeprazole (PRILOSEC) 20 MG capsule Take 1 capsule (20 mg total) by mouth daily. 30 capsule 0   No facility-administered medications prior to visit.    PAST MEDICAL HISTORY: Past Medical History  Diagnosis Date  . COPD (chronic obstructive pulmonary disease)     Never smoked. Questionable diagnosis.  . Atrial fibrillation   . Osteopenia   . Insomnia   . Hyponatremia   . Hypertension   . UTI (lower urinary tract infection)   .  Bursitis of hip   . Trigeminal neuralgia   . Altered mental status 08/24/2012  . Anemia, with significant drop in H/H 08/24/2012  . CHF (congestive heart failure)   . Heart disease   . Acute facial pain   . LBBB (left bundle branch block)   . Nonischemic cardiomyopathy   . Dyslipidemia   . Kidney failure     Stage 3    PAST SURGICAL HISTORY: Past Surgical History  Procedure Laterality Date  . Appendectomy    . Tonsillectomy and adenoidectomy    . Cardiac catheterization  07/04/2012    normal coronaries, EF 30-35%, elevated R & L heart pressures, reduced CO, mild pulm HTN (Dr. K. Mali Hilty)  . Transthoracic echocardiogram  2013    mild conc LVH, RV mildly dilated; LA severely dilated; RA mod dilated; mild-mod mitral annular calcif, calcif of anterior & posterior MV leaflet, mod MR; mod-severe TR with RSVP 30-44mmHg; mild calcif of AV leaflets and mod aortic regurg; mild pulm valve regurg; pericardial effusion (right)  . Nm myocar perf wall motion  2011    lexiscan myoview - normal LV systolic function w/normal wall motion, no evidence of scar/ischemia  . Left and right heart catheterization with coronary angiogram N/A 07/04/2012    Procedure: LEFT AND RIGHT HEART CATHETERIZATION WITH CORONARY ANGIOGRAM;  Surgeon: Pixie Casino, MD;  Location: [facility] CATH LAB;  Service: Cardiovascular;  Laterality: N/A;    FAMILY HISTORY: Family History  Problem Relation Age of Onset  . Stroke Father   . Heart attack Father   . Heart attack Brother   . Heart attack Brother   . Alzheimer's disease Mother     SOCIAL HISTORY: History   Social History  . Marital Status: Widowed    Spouse Name: N/A  . Number of Children: 3  . Years of Education: 12   Occupational History  . Not on file.   Social History Main Topics  . Smoking status: Never Smoker   . Smokeless tobacco: Never Used  . Alcohol Use: No  . Drug Use: No  . Sexual Activity: Not on file   Other Topics Concern  . Not on file    Social History Narrative   Patient is widowed.    Patient has 3 children.    Patient is retired.      PHYSICAL EXAM  Filed Vitals:   06/14/15 0910  BP: 143/84  Pulse: 93  Height: 5\' 3"  (1.6 m)  Weight: 140 lb (63.504 kg)   Body mass index is 24.81 kg/(m^2). Generalized: Well developed, in no acute distress  Musculoskeletal: No deformity   Neurological examination   Mentation: Alert oriented to time, place, history taking.  Follows all commands speech and language fluent  Cranial nerve II-XII: Pupils were equal round  reactive to light, extraocular movements were full, visual field were full on confrontational test. Facial sensation and strength were normal. hearing was intact to finger rubbing bilaterally. Uvula tongue midline. head turning and shoulder shrug were normal and symmetric.Tongue protrusion into cheek strength was normal. Motor: normal bulk and tone, full strength in the BUE, BLE, No focal weakness Sensory: normal and symmetric to light touch, pinprick, and vibration  Coordination: finger-nose-finger, heel-to-shin bilaterally, no dysmetria Reflexes: 1+ upper lower and symmetric, plantar responses were flexor bilaterally. Gait and Station: Rising up from seated position without assistance, normal stance, moderate stride, good arm swing, smooth turning, able to perform tiptoe, and heel walking without difficulty. Tandem gait is mildly  unsteady  DIAGNOSTIC DATA (LABS, IMAGING, TESTING) -    ASSESSMENT AND PLAN  81 y.o. year old female   Trigeminal neuralgia   Continue gabapentin 600 mg 3 times daily Continue refill with her primary care physician Return to clinic for new issues.  Marcial Pacas, M.D. Ph.D.  [facility] [place], [place] [address] Phone: [phone] Fax:      [phone]

## 2015-06-15 ENCOUNTER — Other Ambulatory Visit: Payer: Self-pay | Admitting: Nephrology

## 2015-06-15 DIAGNOSIS — N183 Chronic kidney disease, stage 3 unspecified: Secondary | ICD-10-CM

## 2015-06-18 ENCOUNTER — Ambulatory Visit: Payer: Medicare Other | Admitting: Neurology

## 2015-06-18 ENCOUNTER — Ambulatory Visit
Admission: RE | Admit: 2015-06-18 | Discharge: 2015-06-18 | Disposition: A | Discharge status: Home / Self Care | Payer: Medicare Other | Source: Ambulatory Visit | Attending: Nephrology | Admitting: Nephrology

## 2015-06-18 DIAGNOSIS — N183 Chronic kidney disease, stage 3 unspecified: Secondary | ICD-10-CM

## 2015-09-22 ENCOUNTER — Encounter: Payer: Medicare Other | Attending: Surgery | Admitting: Surgery

## 2015-09-22 DIAGNOSIS — E7800 Pure hypercholesterolemia, unspecified: Secondary | ICD-10-CM | POA: Diagnosis not present

## 2015-09-22 DIAGNOSIS — I482 Chronic atrial fibrillation: Secondary | ICD-10-CM | POA: Diagnosis not present

## 2015-09-22 DIAGNOSIS — L97212 Non-pressure chronic ulcer of right calf with fat layer exposed: Secondary | ICD-10-CM | POA: Insufficient documentation

## 2015-09-22 DIAGNOSIS — I129 Hypertensive chronic kidney disease with stage 1 through stage 4 chronic kidney disease, or unspecified chronic kidney disease: Secondary | ICD-10-CM | POA: Diagnosis not present

## 2015-09-22 DIAGNOSIS — R600 Localized edema: Secondary | ICD-10-CM | POA: Insufficient documentation

## 2015-09-22 DIAGNOSIS — N183 Chronic kidney disease, stage 3 (moderate): Secondary | ICD-10-CM | POA: Diagnosis not present

## 2015-09-22 DIAGNOSIS — M1990 Unspecified osteoarthritis, unspecified site: Secondary | ICD-10-CM | POA: Diagnosis not present

## 2015-09-22 NOTE — Progress Notes (Signed)
Angela Franco ([id number]) Visit Report for 09/22/2015 Abuse/Suicide Risk Screen Details Patient Name: Angela Franco, Angela Franco Date of Service: 09/22/2015 2:30 PM Medical Record Patient Account Number: [id number] Number: Treating RN: Angela Gouty, RN, BSN, Angela Franco Apr 14, 1929 [phone] y.o. Other Clinician: Date of Birth/Sex: Female) Treating Angela Franco, Primary Care Physician/Extender: [place] Physician: Referring Physician: Joycelyn Franco in Treatment: 0 Abuse/Suicide Risk Screen Items Answer ABUSE/SUICIDE RISK SCREEN: Has anyone close to you tried to hurt or harm you recentlyo No Do you feel uncomfortable with anyone in your familyo No Has anyone forced you do things that you didnot want to doo No Do you have any thoughts of harming yourselfo No Patient displays signs or symptoms of abuse and/or neglect. No Electronic Signature(s) Signed: 09/22/2015 2:15:35 PM By: Angela Franco BSN, RN Entered By: Angela Franco on 09/22/2015 14:15:34 Angela Franco ([id number]) -------------------------------------------------------------------------------- Activities of Daily Living Details Patient Name: Angela Franco Date of Service: 09/22/2015 2:30 PM Medical Record Patient Account Number: [id number] Number: Treating RN: Afful, RN, BSN, Angela Franco 1929-10-15 [phone] y.o. Other Clinician: Date of Birth/Sex: Female) Treating Angela Franco, Primary Care Physician/Extender: Betsey Holiday Physician: Referring Physician: Joycelyn Franco in Treatment: 0 Activities of Daily Living Items Answer Activities of Daily Living (Please select one for each item) Drive Automobile Need Assistance Take Medications Completely Able Use Telephone Completely Able Care for Appearance Completely Able Use Toilet Completely Able Bath / Shower Completely Able Dress Self Completely Able Feed Self Completely Able Walk Completely Able Get In / Out Bed Completely [place] Need Assistance Shop for Self Need Assistance Electronic Signature(s) Signed: 09/22/2015 2:18:31 PM By: Angela Franco BSN, RN Entered By: Angela Franco on 09/22/2015 14:18:31 Angela Franco ([id number]) -------------------------------------------------------------------------------- Education Assessment Details Patient Name: Angela Franco Date of Service: 09/22/2015 2:30 PM Medical Record Patient Account Number: [id number] Number: Treating RN: Angela Gouty, RN, BSN, Angela Franco 1929-01-10 (81 y.o. Other Clinician: Date of Birth/Sex: Female) Treating Angela Franco, Primary Care Physician/Extender: Betsey Holiday Physician: Referring Physician: Joycelyn Franco in Treatment: 0 Primary Learner Assessed: Patient Learning Preferences/Education Level/Primary Language Learning Preference: Explanation Highest Education Level: College or Above Preferred Language: English Cognitive Barrier Assessment/Beliefs Language Barrier: No Physical Barrier Assessment Impaired Vision: Yes Glasses, reading Impaired Hearing: No Decreased Hand dexterity: No Knowledge/Comprehension Assessment Knowledge Level: High Comprehension Level: High Ability to understand written High instructions: Ability to understand verbal High instructions: Motivation Assessment Anxiety Level: Calm Cooperation: Cooperative Education Importance: Acknowledges Need Interest in Health Problems: Asks Questions Perception: Coherent Willingness to Engage in Self- High Management Activities: Readiness to Engage in Self- High Management Activities: Electronic Signature(s) Signed: 09/22/2015 2:17:30 PM By: Angela Franco BSN, RN Gardner, Tena ([id number]) Entered By: Angela Franco on 09/22/2015 14:17:30 ABIGAELLE, VERLEY ([id number]) -------------------------------------------------------------------------------- Fall Risk Assessment Details Patient Name: Angela Franco Date of Service: 09/22/2015 2:30 PM Medical Record Patient Account Number: [id number] Number: Treating RN: Angela Gouty, RN, BSN, Angela Franco 1928/12/29 (81 y.o. Other Clinician: Date of Birth/Sex: Female) Treating Angela Franco, Primary Care Physician/Extender: [place] Physician: Referring Physician: Joycelyn Franco in Treatment: 0 Fall Risk Assessment Items FALL RISK ASSESSMENT: History of falling - immediate or within 3 months 25 Yes Secondary diagnosis 0 No Ambulatory aid None/bed rest/wheelchair/nurse 0 Yes Crutches/cane/walker 0 No Furniture 0 No IV Access/Saline Lock 0 No Gait/Training Normal/bed rest/immobile 0 Yes Weak 10 Yes Impaired 0 No Mental Status Oriented to own ability 0 Yes Electronic Signature(s) Signed: 09/22/2015 2:16:55 PM By: Angela Franco  BSN, RN Entered By: Angela Franco on 09/22/2015 14:16:55 Angela Franco ([id number]) -------------------------------------------------------------------------------- Foot Assessment Details Patient Name: Angela Franco, Angela Franco Date of Service: 09/22/2015 2:30 PM Medical Record Patient Account Number: [id number] Number: Treating RN: Angela Gouty, RN, BSN, Angela Franco 1929-07-16 [id number] y.o. Other Clinician: Date of Birth/Sex: Female) Treating Angela Franco, Primary Care Physician/Extender: [place] Physician: Referring Physician: Joycelyn Franco in Treatment: 0 Foot Assessment Items Site Locations + = Sensation present, - = Sensation absent, C = Callus, U = Ulcer R = Redness, W = Warmth, M = Maceration, PU = Pre-ulcerative lesion F = Fissure, S = Swelling, D = Dryness Assessment Right: Left: Other Deformity: No No Prior Foot Ulcer: No No Prior Amputation: No No Charcot Joint: No No Ambulatory Status: Ambulatory Without Help Gait: Steady Electronic Signature(s) Signed: 09/22/2015 2:18:44 PM By: Angela Franco BSN, RN Entered By: Angela Franco on 09/22/2015 14:18:44 Angela Franco, Angela Franco  ([id number]) Angela Franco, Angela Franco ([id number]) -------------------------------------------------------------------------------- Nutrition Risk Assessment Details Patient Name: Angela Franco Date of Service: 09/22/2015 2:30 PM Medical Record Patient Account Number: [id number] Number: Treating RN: Angela Gouty, RN, BSN, Angela Franco 1929-03-02 (81 y.o. Other Clinician: Date of Birth/Sex: Female) Treating Angela Franco, Primary Care Physician/Extender: Betsey Holiday Physician: Referring Physician: Joycelyn Franco in Treatment: 0 Height (in): Weight (lbs): Body Mass Index (BMI): Nutrition Risk Assessment Items NUTRITION RISK SCREEN: I have an illness or condition that made me change the kind and/or 0 No amount of food I eat I eat fewer than two meals per day 0 No I eat few fruits and vegetables, or milk products 0 No I have three or more drinks of beer, liquor or wine almost every day 0 No I have tooth or mouth problems that make it hard for me to eat 0 No I don't always have enough money to buy the food I need 0 No I eat alone most of the time 0 No I take three or more different prescribed or over-the-counter drugs a 0 No day Without wanting to, I have lost or gained 10 pounds in the last six 0 No months I am not always physically able to shop, cook and/or feed myself 0 No Nutrition Protocols Good Risk Protocol 0 No interventions needed Moderate Risk Protocol Electronic Signature(s) Signed: 09/22/2015 2:15:42 PM By: Angela Franco BSN, RN Entered By: Angela Franco on 09/22/2015 14:15:42

## 2015-09-22 NOTE — Progress Notes (Addendum)
Angela, Franco ([id number]) Visit Report for 09/22/2015 Allergy List Details Patient Name: Angela Franco, Angela Franco Date of Service: 09/22/2015 2:30 PM Medical Record Patient Account Number: [id number] Number: Treating RN: Baruch Gouty, RN, BSN, Rita 11/30/1929 [phone] y.o. Other Clinician: Date of Birth/Sex: Female) Treating BURNS III, Primary Care Physician: Reymundo Poll Physician/Extender: Thayer Jew Referring Physician: Joycelyn Das in Treatment: 0 Allergies Active Allergies penicillin Allergy Notes Electronic Signature(s) Signed: 09/22/2015 2:15:27 PM By: Regan Lemming BSN, RN Entered By: Regan Lemming on 09/22/2015 14:15:26 Fredrich Franco ([id number]) -------------------------------------------------------------------------------- Arrival Information Details Patient Name: Fredrich Franco Date of Service: 09/22/2015 2:30 PM Medical Record Patient Account Number: [id number] Number: Treating RN: Baruch Gouty, RN, BSN, Rita Feb 18, 1929 (81 y.o. Other Clinician: Date of Birth/Sex: Female) Treating BURNS III, Primary Care Physician: Reymundo Poll Physician/Extender: Thayer Jew Referring Physician: Joycelyn Das in Treatment: 0 Visit Information Patient Arrived: Ambulatory Arrival Time: 14:07 Accompanied By: DTR Transfer Assistance: None Patient Identification Verified: Yes Secondary Verification Process Yes Completed: Patient Requires Transmission- No Based Precautions: Patient Has Alerts: Yes Patient Alerts: Patient on Blood Thinner ELiquis, ABI;L:1.13 R:1.06 Electronic Signature(s) Signed: 09/22/2015 3:12:47 PM By: Regan Lemming BSN, RN Previous Signature: 09/22/2015 2:44:47 PM Version By: Regan Lemming BSN, RN Previous Signature: 09/22/2015 2:12:05 PM Version By: Regan Lemming BSN, RN Entered By: Regan Lemming on 09/22/2015 15:12:47 Fredrich Franco ([id number]) -------------------------------------------------------------------------------- Clinic Level of Care Assessment  Details Patient Name: Fredrich Franco Date of Service: 09/22/2015 2:30 PM Medical Record Patient Account Number: [id number] Number: Treating RN: Baruch Gouty, RN, BSN, Rita Aug 30, 1929 (81 y.o. Other Clinician: Date of Birth/Sex: Female) Treating BURNS III, Primary Care Physician: Reymundo Poll Physician/Extender: Thayer Jew Referring Physician: Joycelyn Das in Treatment: 0 Clinic Level of Care Assessment Items TOOL 1 Quantity Score []  - Use when EandM and Procedure is performed on INITIAL visit 0 ASSESSMENTS - Nursing Assessment / Reassessment X - General Physical Exam (combine w/ comprehensive assessment (listed just 1 20 below) when performed on new pt. evals) X - Comprehensive Assessment (HX, ROS, Risk Assessments, Wounds Hx, etc.) 1 25 ASSESSMENTS - Wound and Skin Assessment / Reassessment []  - Dermatologic / Skin Assessment (not related to wound area) 0 ASSESSMENTS - Ostomy and/or Continence Assessment and Care []  - Incontinence Assessment and Management 0 []  - Ostomy Care Assessment and Management (repouching, etc.) 0 PROCESS - Coordination of Care X - Simple Patient / Family Education for ongoing care 1 15 []  - Complex (extensive) Patient / Family Education for ongoing care 0 X - Staff obtains Consents, Records, Test Results / Process Orders 1 10 []  - Staff telephones HHA, Nursing Homes / Clarify orders / etc 0 []  - Routine Transfer to another Facility (non-emergent condition) 0 []  - Routine Hospital Admission (non-emergent condition) 0 X - New Admissions / Biomedical engineer / Ordering NPWT, Apligraf, etc. 1 15 []  - Emergency Hospital Admission (emergent condition) 0 PROCESS - Special Needs []  - Pediatric / Minor Patient Management 0 Swiney, Jaylenne ([id number]) []  - Isolation Patient Management 0 []  - Hearing / Language / Visual special needs 0 []  - Assessment of Community assistance (transportation, D/C planning, etc.) 0 []  - Additional assistance / Altered  mentation 0 []  - Support Surface(s) Assessment (bed, cushion, seat, etc.) 0 INTERVENTIONS - Miscellaneous []  - External ear exam 0 []  - Patient Transfer (multiple staff / Civil Service fast streamer / Similar devices) 0 []  - Simple Staple / Suture removal (25 or less) 0 []  - Complex Staple / Suture removal (26 or more) 0 []  - Hypo/Hyperglycemic Management (do not  check if billed separately) 0 X - Ankle / Brachial Index (ABI) - do not check if billed separately 1 15 Has the patient been seen at the hospital within the last three years: Yes Total Score: 100 Level Of Care: New/Established - Level 3 Electronic Signature(s) Signed: 09/22/2015 2:54:31 PM By: Regan Lemming BSN, RN Entered By: Regan Lemming on 09/22/2015 14:54:30 Fredrich Franco ([id number]) -------------------------------------------------------------------------------- Encounter Discharge Information Details Patient Name: Fredrich Franco Date of Service: 09/22/2015 2:30 PM Medical Record Patient Account Number: [id number] Number: Treating RN: 06-23-1929 (81 y.o. Other Clinician: Date of Birth/Sex: Female) Treating BURNS III, Primary Care Physician: Reymundo Poll Physician/Extender: Thayer Jew Referring Physician: Joycelyn Das in Treatment: 0 Encounter Discharge Information Items Schedule Follow-up Appointment: No Medication Reconciliation completed and provided to Patient/Care No Harmonee Tozer: Provided on Clinical Summary of Care: 09/22/2015 Form Type Recipient Paper Patient DP Electronic Signature(s) Signed: 09/22/2015 3:07:40 PM By: Ruthine Dose Entered By: Ruthine Dose on 09/22/2015 15:07:39 Fredrich Franco ([id number]) -------------------------------------------------------------------------------- Lower Extremity Assessment Details Patient Name: Fredrich Franco Date of Service: 09/22/2015 2:30 PM Medical Record Patient Account Number: [id number] Number: Treating RN: Baruch Gouty, RN, BSN, Rita 05-30-1929 (81  y.o. Other Clinician: Date of Birth/Sex: Female) Treating BURNS III, Primary Care Physician: Reymundo Poll Physician/Extender: Thayer Jew Referring Physician: Joycelyn Das in Treatment: 0 Edema Assessment Assessed: [Left: No] [Right: No] Edema: [Left: Yes] [Right: Yes] Calf Left: Right: Point of Measurement: 35 cm From Medial Instep 35 cm 37 cm Ankle Left: Right: Point of Measurement: 10 cm From Medial Instep 22.7 cm 23 cm Vascular Assessment Pulses: Posterior Tibial Palpable: [Left:Yes] [Right:Yes] Dorsalis Pedis Palpable: [Left:Yes] [Right:Yes] Extremity colors, hair growth, and conditions: Extremity Color: [Left:Mottled] [Right:Mottled] Hair Growth on Extremity: [Left:No] [Right:No] Temperature of Extremity: [Left:Warm] [Right:Warm] Capillary Refill: [Left:< 3 seconds] [Right:< 3 seconds] Blood Pressure: Brachial: [Left:141] Dorsalis Pedis: 160 [Left:Dorsalis Pedis: 148] Ankle: Posterior Tibial: 156 [Left:Posterior Tibial: 150 1.13] [Right:1.06] Toe Nail Assessment Left: Right: Thick: No No Discolored: No No Deformed: No No Improper Length and Hygiene: No No DEZI, BRAUNER ([id number]) Electronic Signature(s) Signed: 09/22/2015 2:33:51 PM By: Regan Lemming BSN, RN Entered By: Regan Lemming on 09/22/2015 14:33:51 Fredrich Franco ([id number]) -------------------------------------------------------------------------------- Multi Wound Chart Details Patient Name: Fredrich Franco Date of Service: 09/22/2015 2:30 PM Medical Record Patient Account Number: [id number] Number: Treating RN: Baruch Gouty, RN, BSN, Rita 12/09/1928 (81 y.o. Other Clinician: Date of Birth/Sex: Female) Treating BURNS III, Primary Care Physician: Reymundo Poll Physician/Extender: Thayer Jew Referring Physician: Joycelyn Das in Treatment: 0 Vital Signs Height(in): 65 Pulse(bpm): 66 Weight(lbs): 147 Blood Pressure 141/80 (mmHg): Body Mass Index(BMI): 24 Temperature(F):  98.1 Respiratory Rate 16 (breaths/min): Photos: [1:No Photos] [N/A:N/A] Wound Location: [1:Right Lower Leg - Anterior N/A] Wounding Event: [1:Trauma] [N/A:N/A] Primary Etiology: [1:Trauma, Other] [N/A:N/A] Date Acquired: [1:08/29/2015] [N/A:N/A] Weeks of Treatment: [1:0] [N/A:N/A] Wound Status: [1:Open] [N/A:N/A] Measurements L x W x D 3x2.4x0.1 [N/A:N/A] (cm) Area (cm) : [1:5.655] [N/A:N/A] Volume (cm) : [1:0.565] [N/A:N/A] % Reduction in Area: [1:0.00%] [N/A:N/A] % Reduction in Volume: 0.00% [N/A:N/A] Classification: [1:Partial Thickness] [N/A:N/A] Exudate Amount: [1:Small] [N/A:N/A] Wound Margin: [1:Indistinct, nonvisible] [N/A:N/A] Granulation Amount: [1:None Present (0%)] [N/A:N/A] Necrotic Amount: [1:Large (67-100%)] [N/A:N/A] Necrotic Tissue: [1:Eschar, Adherent Slough N/A] Exposed Structures: [1:Fascia: No Fat: No Tendon: No Muscle: No Joint: No Bone: No Limited to Skin Breakdown] [N/A:N/A] Epithelialization: [1:None] [N/A:N/A] Periwound Skin Texture: Edema: Yes N/A N/A Excoriation: No Induration: No Callus: No Crepitus: No Fluctuance: No Friable: No Rash: No Scarring: No Periwound Skin Moist: Yes N/A N/A Moisture: Maceration: No Dry/Scaly:  No Periwound Skin Color: Atrophie Blanche: No N/A N/A Cyanosis: No Ecchymosis: No Erythema: No Hemosiderin Staining: No Mottled: No Pallor: No Rubor: No Temperature: No Abnormality N/A N/A Tenderness on No N/A N/A Palpation: Wound Preparation: Ulcer Cleansing: N/A N/A Rinsed/Irrigated with Saline Topical Anesthetic Applied: Other: Lidocaine 4% Treatment Notes Electronic Signature(s) Signed: 09/22/2015 2:50:21 PM By: Regan Lemming BSN, RN Entered By: Regan Lemming on 09/22/2015 14:50:21 AMALYA, SALMONS ([id number]) -------------------------------------------------------------------------------- [place] Details Patient Name: Fredrich Franco Date of Service: 09/22/2015 2:30 PM Medical Record  Patient Account Number: [id number] Number: Treating RN: Baruch Gouty, RN, BSN, Rita 10/24/1929 (82 y.o. Other Clinician: Date of Birth/Sex: Female) Treating BURNS III, Primary Care Physician: Reymundo Poll Physician/Extender: Thayer Jew Referring Physician: Joycelyn Das in Treatment: 0 Active Inactive Abuse / Safety / Falls / Self Care Management Nursing Diagnoses: Potential for falls Self care deficit: actual or potential Goals: Patient will remain injury free Date Initiated: 09/22/2015 Goal Status: Active Patient/caregiver will verbalize/demonstrate measures taken to improve the patient's personal safety Date Initiated: 09/22/2015 Goal Status: Active Patient/caregiver will verbalize/demonstrate measures taken to prevent injury and/or falls Date Initiated: 09/22/2015 Goal Status: Active Interventions: Assess fall risk on admission and as needed Assess: immobility, friction, shearing, incontinence upon admission and as needed Assess self care needs on admission and as needed Provide education on basic hygiene Provide education on fall prevention Provide education on personal and home safety Provide education on safe transfers Notes: Orientation to the Wound Care Program Nursing Diagnoses: Knowledge deficit related to the wound healing center program Goals: VALEEN, BORYS ([id number]) Patient/caregiver will verbalize understanding of the [place] Program Date Initiated: 09/22/2015 Goal Status: Active Interventions: Provide education on orientation to the wound center Notes: Wound/Skin Impairment Nursing Diagnoses: Impaired tissue integrity Knowledge deficit related to ulceration/compromised skin integrity Goals: Ulcer/skin breakdown will have a volume reduction of 50% by week 8 Date Initiated: 09/22/2015 Goal Status: Active Ulcer/skin breakdown will have a volume reduction of 80% by week 12 Date Initiated: 09/22/2015 Goal Status:  Active Ulcer/skin breakdown will heal within 14 weeks Date Initiated: 09/22/2015 Goal Status: Active Interventions: Assess patient/caregiver ability to obtain necessary supplies Assess patient/caregiver ability to perform ulcer/skin care regimen upon admission and as needed Assess ulceration(s) every visit Provide education on ulcer and skin care Treatment Activities: Referred to DME Domnick Chervenak for dressing supplies : 09/22/2015 Skin care regimen initiated : 09/22/2015 Notes: Electronic Signature(s) Signed: 09/22/2015 2:50:08 PM By: Regan Lemming BSN, RN Entered By: Regan Lemming on 09/22/2015 14:50:07 Fredrich Franco ([id number]) -------------------------------------------------------------------------------- Pain Assessment Details Patient Name: Fredrich Franco Date of Service: 09/22/2015 2:30 PM Medical Record Patient Account Number: [id number] Number: Treating RN: Baruch Gouty, RN, BSN, Rita 1929/08/09 (81 y.o. Other Clinician: Date of Birth/Sex: Female) Treating BURNS III, Primary Care Physician: Reymundo Poll Physician/Extender: Thayer Jew Referring Physician: Joycelyn Das in Treatment: 0 Active Problems Location of Pain Severity and Description of Pain Patient Has Paino No Site Locations Pain Management and Medication Current Pain Management: Electronic Signature(s) Signed: 09/22/2015 2:25:04 PM By: Regan Lemming BSN, RN Entered By: Regan Lemming on 09/22/2015 14:25:04 Fredrich Franco ([id number]) -------------------------------------------------------------------------------- Wound Assessment Details Patient Name: Fredrich Franco Date of Service: 09/22/2015 2:30 PM Medical Record Patient Account Number: [id number] Number: Treating RN: Baruch Gouty, RN, BSN, Rita 1929/02/15 (81 y.o. Other Clinician: Date of Birth/Sex: Female) Treating BURNS III, Primary Care Physician: Reymundo Poll Physician/Extender: Thayer Jew Referring Physician: Joycelyn Das in Treatment:  0 Wound Status Wound Number: 1 Primary Etiology: Trauma, Other Wound Location: Right Lower Leg -  Anterior Wound Status: Open Wounding Event: Trauma Date Acquired: 08/29/2015 Weeks Of Treatment: 0 Clustered Wound: No Photos Photo Uploaded By: Regan Lemming on 09/22/2015 15:53:59 Wound Measurements Length: (cm) 3 % Reduction in Width: (cm) 2.4 % Reduction in Depth: (cm) 0.1 Epithelializat Area: (cm) 5.655 Tunneling: Volume: (cm) 0.565 Undermining: Area: 0% Volume: 0% ion: None No No Wound Description Classification: Partial Thickness Wound Margin: Indistinct, nonvisible Exudate Amount: Small Foul Odor After Cleansing: No Wound Bed Granulation Amount: None Present (0%) Exposed Structure Necrotic Amount: Large (67-100%) Fascia Exposed: No Necrotic Quality: Eschar, Adherent Slough Fat Layer Exposed: No Tendon Exposed: No Muscle Exposed: No Vollman, Zonnique ([id number]) Joint Exposed: No Bone Exposed: No Limited to Skin Breakdown Periwound Skin Texture Texture Color No Abnormalities Noted: No No Abnormalities Noted: No Callus: No Atrophie Blanche: No Crepitus: No Cyanosis: No Excoriation: No Ecchymosis: No Fluctuance: No Erythema: No Friable: No Hemosiderin Staining: No Induration: No Mottled: No Localized Edema: Yes Pallor: No Rash: No Rubor: No Scarring: No Temperature / Pain Moisture Temperature: No Abnormality No Abnormalities Noted: No Dry / Scaly: No Maceration: No Moist: Yes Wound Preparation Ulcer Cleansing: Rinsed/Irrigated with Saline Topical Anesthetic Applied: Other: Lidocaine 4%, Electronic Signature(s) Signed: 09/22/2015 2:14:57 PM By: Regan Lemming BSN, RN Entered By: Regan Lemming on 09/22/2015 14:14:57 Fredrich Franco ([id number]) -------------------------------------------------------------------------------- Vitals Details Patient Name: Fredrich Franco Date of Service: 09/22/2015 2:30 PM Medical Record Patient Account Number:  [id number] Number: Treating RN: Baruch Gouty, RN, BSN, Rita Mar 20, 1929 (81 y.o. Other Clinician: Date of Birth/Sex: Female) Treating BURNS III, Primary Care Physician: Reymundo Poll Physician/Extender: Thayer Jew Referring Physician: Joycelyn Das in Treatment: 0 Vital Signs Time Taken: 14:40 Temperature (F): 98.1 Height (in): 65 Pulse (bpm): 66 Source: Stated Respiratory Rate (breaths/min): 16 Weight (lbs): 147 Blood Pressure (mmHg): 141/80 Body Mass Index (BMI): 24.5 Reference Range: 80 - 120 mg / dl Electronic Signature(s) Signed: 09/22/2015 2:41:19 PM By: Regan Lemming BSN, RN Entered By: Regan Lemming on 09/22/2015 14:41:19

## 2015-09-23 NOTE — Progress Notes (Signed)
Angela Franco, Angela Franco ([id number]) Visit Report for 09/22/2015 Chief Complaint Document Details Patient Name: Angela Franco, Angela Franco Date of Service: 09/22/2015 2:30 PM Medical Record Patient Account Number: [id number] Number: Treating RN: May 19, 1929 (81 y.o. Other Clinician: Date of Birth/Sex: Female) Treating BURNS III, Primary Care Physician/Extender: Betsey Holiday Physician: Referring Physician: Joycelyn Das in Treatment: 0 Information Obtained from: Patient Chief Complaint Right calf traumatic ulceration. Electronic Signature(s) Signed: 09/22/2015 3:24:17 PM By: Loletha Grayer MD Entered By: Loletha Grayer on 09/22/2015 15:04:24 Angela Franco ([id number]) -------------------------------------------------------------------------------- Debridement Details Patient Name: Angela Franco Date of Service: 09/22/2015 2:30 PM Medical Record Patient Account Number: [id number] Number: Treating RN: Feb 18, 1929 (81 y.o. Other Clinician: Date of Birth/Sex: Female) Treating BURNS III, Primary Care Physician/Extender: Glena Norfolk, Mallie Mussel Physician: Referring Physician: Joycelyn Das in Treatment: 0 Debridement Performed for Wound #1 Right,Anterior Lower Leg Assessment: Performed By: Physician BURNS III, Teressa Senter., MD Debridement: Debridement Pre-procedure Yes Verification/Time Out Taken: Start Time: 14:48 Pain Control: Lidocaine 4% Topical Solution Level: Skin/Subcutaneous Tissue Total Area Debrided (L x 3 (cm) x 2.4 (cm) = 7.2 (cm) W): Tissue and other Viable, Non-Viable, Exudate, Fat, Fibrin/Slough, Subcutaneous material debrided: Instrument: Curette Bleeding: Minimum Hemostasis Achieved: Pressure End Time: 14:52 Procedural Pain: 0 Post Procedural Pain: 0 Response to Treatment: Procedure was tolerated well Post Debridement Measurements of Total Wound Length: (cm) 3 Width: (cm) 2.4 Depth: (cm) 0.2 Volume: (cm) 1.131 Post  Procedure Diagnosis Same as Pre-procedure Electronic Signature(s) Signed: 09/22/2015 3:24:17 PM By: Loletha Grayer MD Previous Signature: 09/22/2015 2:51:31 PM Version By: Regan Lemming BSN, RN Entered By: Loletha Grayer on 09/22/2015 15:04:01 Angela Franco, Angela Franco ([id number]) Angela Franco, Angela Franco ([id number]) -------------------------------------------------------------------------------- HPI Details Patient Name: Angela Franco Date of Service: 09/22/2015 2:30 PM Medical Record Patient Account Number: [id number] Number: Treating RN: 1929/08/07 (81 y.o. Other Clinician: Date of Birth/Sex: Female) Treating BURNS III, Primary Care Physician/Extender: Betsey Holiday Physician: Referring Physician: Joycelyn Das in Treatment: 0 History of Present Illness HPI Description: Very pleasant 81 year old with history of atrial fibrillation (on Eliquis) and chronic kidney disease. No history of diabetes or PAD. Right ABI 1.06. She fell and traumatized her right anterior calf in September 2016. She describes significant swelling and bruising. She reportedly developed cellulitis, and is currently taking a course of levofloxacin, which she started on 09/17/2015. Ambulating per her baseline. No significant pain. No ischemic rest pain or claudication. Performing dressing changes with Neosporin. No compression. No fever or chills. Moderate serosanguineous drainage. Electronic Signature(s) Signed: 09/22/2015 3:24:17 PM By: Loletha Grayer MD Entered By: Loletha Grayer on 09/22/2015 15:14:27 Angela Franco ([id number]) -------------------------------------------------------------------------------- Physical Exam Details Patient Name: Angela Franco Date of Service: 09/22/2015 2:30 PM Medical Record Patient Account Number: [id number] Number: Treating RN: Baruch Gouty, RN, BSN, Rita 06-19-1929 (81 y.o. Other Clinician: Date of Birth/Sex: Female) Treating BURNS  III, Primary Care Physician/Extender: Betsey Holiday Physician: Referring Physician: Joycelyn Das in Treatment: 0 Constitutional . Pulse regular. Respirations normal and unlabored. Afebrile. [name] Respiratory WNL. No retractions.. Cardiovascular Pedal Pulses WNL. Integumentary (Hair, Skin) .[name] Neurological Sensation normal to touch, pin,and vibration. Psychiatric Judgement and insight Intact.. Oriented times 3.. No evidence of depression, anxiety, or agitation.. Notes Right anterior calf ulceration. Full-thickness. Minimal residual hematoma. Debrided to healthy, bleeding subcutaneous fat. One plus pitting edema. No significant cellulitis. Palpable DP. Right ABI 1.06. Electronic Signature(s) Signed: 09/22/2015 3:24:17 PM By: Loletha Grayer MD Entered By: Loletha Grayer on 09/22/2015 15:15:15 Angela Franco ([id number]) -------------------------------------------------------------------------------- Physician Orders  Details Patient Name: Angela Franco, Angela Franco Date of Service: 09/22/2015 2:30 PM Medical Record Patient Account Number: [id number] Number: Treating RN: Baruch Gouty, RN, BSN, Rita [birth year] [phone] y.o. Other Clinician: Date of Birth/Sex: Female) Treating BURNS III, Primary Care Physician/Extender: Betsey Holiday Physician: Referring Physician: Joycelyn Das in Treatment: 0 Verbal / Phone Orders: Yes Clinician: Afful, RN, BSN, Rita Read Back and Verified: Yes Diagnosis Coding Wound Cleansing Wound #1 Right,Anterior Lower Leg o Cleanse wound with mild soap and water o May Shower, gently pat wound dry prior to applying new dressing. Anesthetic Wound #1 Right,Anterior Lower Leg o Topical Lidocaine 4% cream applied to wound bed prior to debridement Primary Wound Dressing Wound #1 Right,Anterior Lower Leg o Aquacel Ag Secondary Dressing Wound #1 Right,Anterior Lower Leg o Boardered Foam Dressing Dressing Change Frequency o  Change dressing every other day. Follow-up Appointments Wound #1 Right,Anterior Lower Leg o Return Appointment in 1 week. Edema Control Wound #1 Right,Anterior Lower Leg o Tubigrip Medications-please add to medication list. Wound #1 Right,Anterior Lower Leg o P.O. Antibiotics - levofloxacin 250mg : continue taking as prescribed by PCP. Angela Franco, Angela Franco ([id number]) Electronic Signature(s) Signed: 09/22/2015 2:54:03 PM By: Regan Lemming BSN, RN Signed: 09/22/2015 3:24:17 PM By: Loletha Grayer MD Entered By: Regan Lemming on 09/22/2015 14:54:03 Angela Franco, Angela Franco ([id number]) -------------------------------------------------------------------------------- Problem List Details Patient Name: Angela Franco Date of Service: 09/22/2015 2:30 PM Medical Record Patient Account Number: [id number] Number: Treating RN: 05-Jan-1929 (81 y.o. 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 I48.2 Chronic atrial fibrillation 09/22/2015 Yes N18.3 Chronic kidney disease, stage 3 (moderate) 09/22/2015 Yes R60.0 Localized edema 09/22/2015 Yes Inactive Problems Resolved Problems Electronic Signature(s) Signed: 09/22/2015 3:24:17 PM By: Loletha Grayer MD Entered By: Loletha Grayer on 09/22/2015 15:15:34 Angela Franco ([id number]) -------------------------------------------------------------------------------- Progress Note/History and Physical Details Patient Name: Angela Franco Date of Service: 09/22/2015 2:30 PM Medical Record Patient Account Number: [id number] Number: Treating RN: Baruch Gouty, RN, BSN, Rita July 09, 1929 (81 y.o. Other Clinician: Date of  Birth/Sex: Female) Treating BURNS III, Primary Care Physician/Extender: Betsey Holiday Physician: Referring Physician: Joycelyn Das in Treatment: 0 Subjective Chief Complaint Information obtained from Patient Right calf traumatic ulceration. History of Present Illness (HPI) Very pleasant 81 year old with history of atrial fibrillation (on Eliquis) and chronic kidney disease. No history of diabetes or PAD. Right ABI 1.06. She fell and traumatized her right anterior calf in September 2016. She describes significant swelling and bruising. She reportedly developed cellulitis, and is currently taking a course of levofloxacin, which she started on 09/17/2015. Ambulating per her baseline. No significant pain. No ischemic rest pain or claudication. Performing dressing changes with Neosporin. No compression. No fever or chills. Moderate serosanguineous drainage. Wound History Patient presents with 1 open wound that has been present for approximately 85month. Patient has been treating wound in the following manner: neosporin. Laboratory tests have not been performed in the last month. Patient reportedly has not tested positive for an antibiotic resistant organism. Patient reportedly has not tested positive for osteomyelitis. Patient reportedly has not had testing performed to evaluate circulation in the legs. Patient experiences the following problems associated with their wounds: infection, swelling. Patient History Information obtained from Patient. Allergies penicillin Family History Cancer - Mother, Heart Disease - Mother, Father, Hypertension - Mother, Maternal  Grandparents, Lung Disease - Siblings, Stroke - Father, No family history of Diabetes, Hereditary Spherocytosis, Kidney Disease, Seizures, Thyroid Problems, Tuberculosis. AKANKSHA, BELLMORE ([id number]) Social History Never smoker, Marital Status - Widowed, Alcohol Use - Never, Drug Use - No History, Caffeine Use -  Daily - coffee. Medical History Eyes Patient has history of Cataracts - removed Denies history of Glaucoma Ear/Nose/Mouth/Throat Denies history of Chronic sinus problems/congestion, Middle ear problems Hematologic/Lymphatic Patient has history of Anemia Denies history of Hemophilia, Human Immunodeficiency Virus, Lymphedema, Sickle Cell Disease Respiratory Denies history of Aspiration, Asthma, Chronic Obstructive Pulmonary Disease (COPD), Pneumothorax, Sleep Apnea, Tuberculosis Cardiovascular Patient has history of Arrhythmia, Hypertension Gastrointestinal Denies history of Cirrhosis , Colitis, Crohn s, Hepatitis A, Hepatitis B, Hepatitis C Endocrine Denies history of Type I Diabetes, Type II Diabetes Immunological Denies history of Lupus Erythematosus, Raynaud s, Scleroderma Integumentary (Skin) Denies history of History of Burn, History of pressure wounds Musculoskeletal Patient has history of Osteoarthritis Denies history of Gout, Rheumatoid Arthritis Neurologic Denies history of Dementia, Neuropathy, Quadriplegia, Paraplegia, Seizure Disorder Oncologic Denies history of Received Chemotherapy, Received Radiation Psychiatric Denies history of Anorexia/bulimia, Confinement Anxiety Medical And Surgical History Notes Cardiovascular high cholesterol Genitourinary CKD 3 Review of Systems (ROS) Constitutional Symptoms (General Health) The patient has no complaints or symptoms. Eyes Complains or has symptoms of Glasses / Contacts. Ear/Nose/Mouth/Throat The patient has no complaints or symptoms. Hematologic/Lymphatic Complains or has symptoms of Bleeding / Clotting Disorders. ELIENAI, GAILEY ([id number]) Respiratory The patient has no complaints or symptoms. Cardiovascular The patient has no complaints or symptoms. Gastrointestinal The patient has no complaints or symptoms. Endocrine The patient has no complaints or symptoms. Genitourinary The patient has no  complaints or symptoms. Immunological The patient has no complaints or symptoms. Integumentary (Skin) Complains or has symptoms of Wounds, Bleeding or bruising tendency, Swelling. Musculoskeletal The patient has no complaints or symptoms. Neurologic The patient has no complaints or symptoms. Oncologic The patient has no complaints or symptoms. Psychiatric The patient has no complaints or symptoms. Objective Constitutional Pulse regular. Respirations normal and unlabored. Afebrile. Vitals Time Taken: 2:40 PM, Height: 65 in, Source: Stated, Weight: 147 lbs, BMI: 24.5, Temperature: 98.1  F, Pulse: 66 bpm, Respiratory Rate: 16 breaths/min, Blood Pressure: 141/80 mmHg. Respiratory WNL. No retractions.. Cardiovascular Pedal Pulses WNL. Neurological Sensation normal to touch, pin,and vibration. Psychiatric Judgement and insight Intact.. Oriented times 3.. No evidence of depression, anxiety, or agitation.Florene Glen, Carlyon Shadow ([id number]) General Notes: Right anterior calf ulceration. Full-thickness. Minimal residual hematoma. Debrided to healthy, bleeding subcutaneous fat. One plus pitting edema. No significant cellulitis. Palpable DP. Right ABI 1.06. Integumentary (Hair, Skin) Wound #1 status is Open. Original cause of wound was Trauma. The wound is located on the Right,Anterior Lower Leg. The wound measures 3cm length x 2.4cm width x 0.1cm depth; 5.655cm^2 area and 0.565cm^3 volume. The wound is limited to skin breakdown. There is no tunneling or undermining noted. There is a small amount of drainage noted. The wound margin is indistinct and nonvisible. There is no granulation within the wound bed. There is a large (67-100%) amount of necrotic tissue within the wound bed including Eschar and Adherent Slough. The periwound skin appearance exhibited: Localized Edema, Moist. The periwound skin appearance did not exhibit: Callus, Crepitus, Excoriation, Fluctuance, Friable, Induration, Rash,  Scarring, Dry/Scaly, Maceration, Atrophie Blanche, Cyanosis, Ecchymosis, Hemosiderin Staining, Mottled, Pallor, Rubor, Erythema. Periwound temperature was noted as No Abnormality. Assessment Active Problems ICD-10 L97.212 - Non-pressure chronic ulcer of right calf with fat layer exposed I48.2 - Chronic  atrial fibrillation N18.3 - Chronic kidney disease, stage 3 (moderate) R60.0 - Localized edema Right anterior calf traumatic ulceration. Procedures Wound #1 Wound #1 is a Trauma, Other located on the Right,Anterior Lower Leg . There was a Skin/Subcutaneous Tissue Debridement ([id number]) debridement with total area of 7.2 sq cm performed by BURNS III, Teressa Senter., MD. with the following instrument(s): Curette to remove Viable and Non-Viable tissue/material including Exudate, Fat, Fibrin/Slough, and Subcutaneous after achieving pain control using Lidocaine 4% Topical Solution. A time out was conducted prior to the start of the procedure. A Minimum amount of bleeding was controlled with Pressure. The procedure was tolerated well with a pain level of 0 throughout and a pain level of 0 following the procedure. Post Debridement Measurements: 3cm length x 2.4cm width x 0.2cm depth; 1.131cm^3 volume. Post procedure Diagnosis Wound #1: Same as Pre-Procedure Angela Franco, Angela Franco ([id number]) Plan Wound Cleansing: Wound #1 Right,Anterior Lower Leg: Cleanse wound with mild soap and water May Shower, gently pat wound dry prior to applying new dressing. Anesthetic: Wound #1 Right,Anterior Lower Leg: Topical Lidocaine 4% cream applied to wound bed prior to debridement Primary Wound Dressing: Wound #1 Right,Anterior Lower Leg: Aquacel Ag Secondary Dressing: Wound #1 Right,Anterior Lower Leg: Boardered Foam Dressing Dressing Change Frequency: Change dressing every other day. Follow-up Appointments: Wound #1 Right,Anterior Lower Leg: Return Appointment in 1 week. Edema Control: Wound #1  Right,Anterior Lower Leg: Tubigrip Medications-please add to medication list.: Wound #1 Right,Anterior Lower Leg: P.O. Antibiotics - levofloxacin 250mg : continue taking as prescribed by PCP. Silver alginate dressing changes. Complete levofloxacin. Tubigrip for edema control. Frequent leg elevation and ambulation. Electronic Signature(s) Signed: 09/22/2015 3:24:17 PM By: Loletha Grayer MD Entered By: Loletha Grayer on 09/22/2015 15:17:10 ZAYDAH, Angela Franco ([id number]) Angela Franco, ROUTSON ([id number]) -------------------------------------------------------------------------------- ROS/PFSH Details Patient Name: Angela Franco Date of Service: 09/22/2015 2:30 PM Medical Record Patient Account Number: [id number] Number: Treating RN: Baruch Gouty, RN, BSN, Rita October 17, 1929 [phone] y.o. Other Clinician: Date of Birth/Sex: Female) Treating BURNS III, Primary Care Physician/Extender: Glena Norfolk, Mallie Mussel Physician: Referring Physician: Joycelyn Das in Treatment: 0 Label Progress Note Print Version as History and Physical for this encounter Information Obtained From Patient Wound History Do you currently have one or more open woundso Yes How many open wounds do you currently haveo 1 Approximately how long have you had your woundso 42month How have you been treating your wound(s) until nowo neosporin Has your wound(s) ever healed and then re-openedo No Have you had any lab work done in the past montho No Have you tested positive for an antibiotic resistant organism (MRSA, VRE)o No Have you tested positive for osteomyelitis (bone infection)o No Have you had any tests for circulation on your legso No Have you had other problems associated with your woundso Infection, Swelling Eyes Complaints and Symptoms: Positive for: Glasses / Contacts Medical History: Positive for: Cataracts - removed Negative for: Glaucoma Hematologic/Lymphatic Complaints and Symptoms: Positive for:  Bleeding / Clotting Disorders Medical History: Positive for: Anemia Negative for: Hemophilia; Human Immunodeficiency Virus; Lymphedema; Sickle Cell Disease Integumentary (Skin) Complaints and Symptoms: Positive for: Wounds; Bleeding or bruising tendency; Swelling Medical History: QUINCEE, GITTENS ([id number]) Negative for: History of Burn; History of pressure wounds Constitutional Symptoms (General Health) Complaints and Symptoms: No Complaints or Symptoms Ear/Nose/Mouth/Throat Complaints and Symptoms: No Complaints or Symptoms Medical History: Negative for: Chronic sinus problems/congestion; Middle ear problems Respiratory Complaints and Symptoms: No Complaints or Symptoms Medical History: Negative for: Aspiration; Asthma; Chronic Obstructive Pulmonary Disease (COPD); Pneumothorax; Sleep Apnea; Tuberculosis  Cardiovascular Complaints and Symptoms: No Complaints or Symptoms Medical History: Positive for: Arrhythmia; Hypertension Past Medical History Notes: high cholesterol Gastrointestinal Complaints and Symptoms: No Complaints or Symptoms Medical History: Negative for: Cirrhosis ; Colitis; Crohnos; Hepatitis A; Hepatitis B; Hepatitis C Endocrine Complaints and Symptoms: No Complaints or Symptoms Medical History: Negative for: Type I Diabetes; Type II Diabetes Genitourinary Angela Franco, Angela Franco ([id number]) Complaints and Symptoms: No Complaints or Symptoms Medical History: Past Medical History Notes: CKD 3 Immunological Complaints and Symptoms: No Complaints or Symptoms Medical History: Negative for: Lupus Erythematosus; Raynaudos; Scleroderma Musculoskeletal Complaints and Symptoms: No Complaints or Symptoms Medical History: Positive for: Osteoarthritis Negative for: Gout; Rheumatoid Arthritis Neurologic Complaints and Symptoms: No Complaints or Symptoms Medical History: Negative for: Dementia; Neuropathy; Quadriplegia; Paraplegia; Seizure  Disorder Oncologic Complaints and Symptoms: No Complaints or Symptoms Medical History: Negative for: Received Chemotherapy; Received Radiation Psychiatric Complaints and Symptoms: No Complaints or Symptoms Medical History: Negative for: Anorexia/bulimia; Confinement Anxiety HBO Extended History Items Eyes: Cataracts Angela Franco, Angela Franco ([id number]) Family and Social History Cancer: Yes - Mother; Diabetes: No; Heart Disease: Yes - Mother, Father; Hereditary Spherocytosis: No; Hypertension: Yes - Mother, Maternal Grandparents; Kidney Disease: No; Lung Disease: Yes - Siblings; Seizures: No; Stroke: Yes - Father; Thyroid Problems: No; Tuberculosis: No; Never smoker; Marital Status - Widowed; Alcohol Use: Never; Drug Use: No History; Caffeine Use: Daily - coffee; Financial Concerns: No; Food, Clothing or Shelter Needs: No; Support System Lacking: No; Transportation Concerns: No; Advanced Directives: Yes (Not Provided); Patient does not want information on Advanced Directives; Living Will: Yes - Gevena Barre (Not Provided) Physician Affirmation I have reviewed and agree with the above information. Electronic Signature(s) Signed: 09/22/2015 3:24:17 PM By: Loletha Grayer MD Signed: 09/22/2015 3:54:59 PM By: Regan Lemming BSN, RN Previous Signature: 09/22/2015 2:43:44 PM Version By: Regan Lemming BSN, RN Previous Signature: 09/22/2015 2:24:47 PM Version By: Regan Lemming BSN, RN Entered By: Loletha Grayer on 09/22/2015 15:16:53 Angela Franco ([id number]) -------------------------------------------------------------------------------- SuperBill Details Patient Name: Angela Franco Date of Service: 09/22/2015 Medical Record Patient Account Number: [id number] Number: Treating RN: Baruch Gouty, RN, BSN, Rita [birth year] (81 y.o. Other Clinician: Date of Birth/Sex: Female) Treating BURNS III, Primary Care Physician/Extender: Betsey Holiday Physician: Weeks in Treatment:  0 Referring Physician: Reymundo Poll Diagnosis Coding ICD-10 Codes Code Description [phone] Non-pressure chronic ulcer of right calf with fat layer exposed I48.2 Chronic atrial fibrillation N18.3 Chronic kidney disease, stage 3 (moderate) R60.0 Localized edema Facility Procedures CPT4 Code: [id number] Description: [facility] VISIT-LEV 3 EST PT Modifier: Quantity: 1 CPT4 Code: [id number] Description: 70488 - DEB SUBQ TISSUE 20 SQ CM/< ICD-10 Description Diagnosis L97.212 Non-pressure chronic ulcer of right calf with fat Modifier: layer exposed Quantity: 1 Physician Procedures CPT4 Code: [id number] Description: WC PHYS LEVEL 3 o NEW PT ICD-10 Description Diagnosis L97.212 Non-pressure chronic ulcer of right calf with fat Modifier: layer exposed Quantity: 1 CPT4 Code: [id number] Description: 11042 - WC PHYS SUBQ TISS 20 SQ CM ICD-10 Description Diagnosis L97.212 Non-pressure chronic ulcer of right calf with fat Modifier: layer exposed Quantity: 1 Electronic Signature(s) Signed: 09/22/2015 3:24:17 PM By: Loletha Grayer MD Entered By: Loletha Grayer on 09/22/2015 15:16:28 Angela Franco, Angela Franco ([id number])

## 2015-09-29 ENCOUNTER — Encounter: Payer: Medicare Other | Admitting: Surgery

## 2015-09-29 DIAGNOSIS — L97212 Non-pressure chronic ulcer of right calf with fat layer exposed: Secondary | ICD-10-CM | POA: Diagnosis not present

## 2015-09-30 NOTE — Progress Notes (Signed)
DRU, LAUREL ([id number]) Visit Report for 09/29/2015 Chief Complaint Document Details Patient Name: Angela Franco, Angela Franco Date of Service: 09/29/2015 9:30 AM Medical Record Patient Account Number: [id number] Number: Treating RN: Montey Hora August 12, 1929 (81 y.o. Other Clinician: Date of Birth/Sex: Female) Treating BURNS III, Primary Care Physician/Extender: Betsey Holiday Physician: Referring Physician: Joycelyn Das in Treatment: 1 Information Obtained from: Patient Chief Complaint Right calf traumatic ulceration. Electronic Signature(s) Signed: 09/29/2015 3:59:00 PM By: Loletha Grayer MD Entered By: Loletha Grayer on 09/29/2015 13:43:08 Angela Franco ([id number]) -------------------------------------------------------------------------------- HPI Details Patient Name: Angela Franco Date of Service: 09/29/2015 9:30 AM Medical Record Patient Account Number: [id number] Number: Treating RN: Montey Hora 1929/11/02 (81 y.o. Other Clinician: Date of Birth/Sex: Female) Treating BURNS III, Primary Care Physician/Extender: Betsey Holiday Physician: Referring Physician: Joycelyn Das in Treatment: 1 History of Present Illness HPI Description: Very pleasant 81 year old with history of atrial fibrillation (on Eliquis) and chronic kidney disease. No history of diabetes or PAD. Right ABI 1.06. She fell and traumatized her right anterior calf in September 2016. She describes significant swelling and bruising. She reportedly developed cellulitis, and completed a course of levofloxacin. Ambulating per her baseline. No significant pain. No ischemic rest pain or claudication. Performing dressing changes with Prisma and using a Tubigrip for edema control. No fever or chills. Moderate serosanguineous drainage. Electronic Signature(s) Signed: 09/29/2015 3:59:00 PM By: Loletha Grayer MD Entered By: Loletha Grayer on 09/29/2015  13:46:07 Angela Franco ([id number]) -------------------------------------------------------------------------------- Physical Exam Details Patient Name: Angela Franco Date of Service: 09/29/2015 9:30 AM Medical Record Patient Account Number: [id number] Number: Treating RN: Montey Hora 31-Jul-1929 (81 y.o. Other Clinician: Date of Birth/Sex: Female) Treating BURNS III, Primary Care Physician/Extender: Betsey Holiday Physician: Referring Physician: Joycelyn Das in Treatment: 1 Constitutional . Pulse regular. Respirations normal and unlabored. Afebrile. . Notes Right anterior calf ulceration. Full-thickness. Minimal residual hematoma. Debrided to healthy, bleeding subcutaneous fat. One plus pitting edema. No significant cellulitis. Palpable DP. Right ABI 1.06. Electronic Signature(s) Signed: 09/29/2015 3:59:00 PM By: Loletha Grayer MD Entered By: Loletha Grayer on 09/29/2015 13:44:13 Angela Franco ([id number]) -------------------------------------------------------------------------------- Physician Orders Details Patient Name: Angela Franco Date of Service: 09/29/2015 9:30 AM Medical Record Patient Account Number: [id number] Number: Treating RN: Montey Hora 1929/04/22 (81 y.o. Other Clinician: Date of Birth/Sex: Female) Treating BURNS III, Primary Care Physician/Extender: Betsey Holiday Physician: Referring Physician: Joycelyn Das in Treatment: 1 Verbal / Phone Orders: Yes Clinician: Montey Hora Read Back and Verified: Yes Diagnosis Coding Wound Cleansing Wound #1 Right,Anterior Lower Leg o Cleanse wound with mild soap and water o May Shower, gently pat wound dry prior to applying new dressing. Anesthetic Wound #1 Right,Anterior Lower Leg o Topical Lidocaine 4% cream applied to wound bed prior to debridement Primary Wound Dressing Wound #1 Right,Anterior Lower Leg o Prisma Ag - or collagen with  silver equivalent Secondary Dressing Wound #1 Right,Anterior Lower Leg o Boardered Foam Dressing Dressing Change Frequency o Change dressing every other day. Follow-up Appointments Wound #1 Right,Anterior Lower Leg o Return Appointment in 1 week. Edema Control Wound #1 Right,Anterior Lower Leg o Tubigrip Electronic Signature(s) Signed: 09/29/2015 3:59:00 PM By: Loletha Grayer MD Signed: 09/29/2015 5:13:41 PM By: Angela Franco, Angela Franco ([id number]) Entered By: Montey Hora on 09/29/2015 10:16:03 Angela Franco, Angela Franco ([id number]) -------------------------------------------------------------------------------- Problem List Details Patient Name: Angela Franco Date of Service: 09/29/2015 9:30 AM Medical Record Patient Account Number: [id number] Number: Treating RN: Montey Hora 1929-02-15 (81 y.o.  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/22/2015 Yes exposed I48.2 Chronic atrial fibrillation 09/22/2015 Yes N18.3 Chronic kidney disease, stage 3 (moderate) 09/22/2015 Yes R60.0 Localized edema 09/22/2015 Yes Inactive Problems Resolved Problems Electronic Signature(s) Signed: 09/29/2015 3:59:00 PM By: Loletha Grayer MD Entered By: Loletha Grayer on 09/29/2015 13:42:56 Angela Franco ([id number]) -------------------------------------------------------------------------------- Progress Note Details Patient Name: Angela Franco Date of Service: 09/29/2015 9:30 AM Medical Record Patient Account Number: [id number] Number: Treating RN: Montey Hora March 19, 1929 (81 y.o. Other Clinician: Date of Birth/Sex: Female) Treating BURNS III, Primary Care  Physician/Extender: Betsey Holiday Physician: Referring Physician: Joycelyn Das in Treatment: 1 Subjective Chief Complaint Information obtained from Patient Right calf traumatic ulceration. History of Present Illness (HPI) Very pleasant 81 year old with history of atrial fibrillation (on Eliquis) and chronic kidney disease. No history of diabetes or PAD. Right ABI 1.06. She fell and traumatized her right anterior calf in September 2016. She describes significant swelling and bruising. She reportedly developed cellulitis, and completed a course of levofloxacin. Ambulating per her baseline. No significant pain. No ischemic rest pain or claudication. Performing dressing changes with Prisma and using a Tubigrip for edema control. No fever or chills. Moderate serosanguineous drainage. Objective Constitutional Pulse regular. Respirations normal and unlabored. Afebrile. Vitals Time Taken: 10:16 AM, Height: 65 in, Weight: 147 lbs, BMI: 24.5, Temperature: 98.3 F, Pulse: 72 bpm, Respiratory Rate: 18 breaths/min, Blood Pressure: 136/82 mmHg. General Notes: Right anterior calf ulceration. Full-thickness. Minimal residual hematoma. Debrided to healthy, bleeding subcutaneous fat. One plus pitting edema. No significant cellulitis. Palpable DP. Right ABI 1.06. Integumentary (Hair, Skin) Wound #1 status is Open. Original cause of wound was Trauma. The wound is located on the Right,Anterior Lower Leg. The wound measures 2.4cm length x 2.1cm width x 0.1cm depth; 3.958cm^2 Angela Franco, Angela Franco ([id number]) area and 0.396cm^3 volume. The wound is limited to skin breakdown. There is no tunneling or undermining noted. There is a small amount of drainage noted. The wound margin is indistinct and nonvisible. There is small (1-33%) red granulation within the wound bed. There is a large (67-100%) amount of necrotic tissue within the wound bed including Eschar and Adherent Slough. The periwound skin  appearance exhibited: Localized Edema, Moist. The periwound skin appearance did not exhibit: Callus, Crepitus, Excoriation, Fluctuance, Friable, Induration, Rash, Scarring, Dry/Scaly, Maceration, Atrophie Blanche, Cyanosis, Ecchymosis, Hemosiderin Staining, Mottled, Pallor, Rubor, Erythema. Periwound temperature was noted as No Abnormality. Assessment Active Problems ICD-10 L97.212 - Non-pressure chronic ulcer of right calf with fat layer exposed I48.2 - Chronic atrial fibrillation N18.3 - Chronic kidney disease, stage 3 (moderate) R60.0 - Localized edema Right calf traumatic ulceration. Plan Wound Cleansing: Wound #1 Right,Anterior Lower Leg: Cleanse wound with mild soap and water May Shower, gently pat wound dry prior to applying new dressing. Anesthetic: Wound #1 Right,Anterior Lower Leg: Topical Lidocaine 4% cream applied to wound bed prior to debridement Primary Wound Dressing: Wound #1 Right,Anterior Lower Leg: Prisma Ag - or collagen with silver equivalent Secondary Dressing: Wound #1 Right,Anterior Lower Leg: Boardered Foam Dressing Dressing Change Frequency: Change dressing every other day. Follow-up Appointments: Wound #1 Right,Anterior Lower Leg: Return Appointment in 1 week. Angela Franco, Angela Franco ([id number]) Edema Control: Wound #1 Right,Anterior Lower Leg: Tubigrip Prisma. Tubigrip. Electronic Signature(s) Signed: 09/29/2015 3:59:00 PM By: Loletha Grayer MD  Entered By: Loletha Grayer on 09/29/2015 13:46:24 Angela Franco ([id number]) -------------------------------------------------------------------------------- SuperBill Details Patient Name: Angela Franco Date of Service: 09/29/2015 Medical Record Patient Account Number: [id number] Number: Treating RN: Montey Hora 1929-11-19 (81 y.o. Other Clinician: Date of Birth/Sex: Female) Treating BURNS III, Primary Care Physician/Extender: Betsey Holiday Physician: Weeks in Treatment:  1 Referring Physician: Reymundo Poll Diagnosis Coding ICD-10 Codes Code Description [phone] Non-pressure chronic ulcer of right calf with fat layer exposed I48.2 Chronic atrial fibrillation N18.3 Chronic kidney disease, stage 3 (moderate) R60.0 Localized edema Facility Procedures CPT4 Code: [id number] Description: 99213 - WOUND CARE VISIT-LEV 3 EST PT Modifier: Quantity: 1 Physician Procedures CPT4 Code: 9201007 Description: 12197 - WC PHYS LEVEL 2 - EST PT ICD-10 Description Diagnosis L97.212 Non-pressure chronic ulcer of right calf with fat Modifier: layer exposed Quantity: 1 Electronic Signature(s) Signed: 09/29/2015 3:59:00 PM By: Loletha Grayer MD Entered By: Loletha Grayer on 09/29/2015 13:46:41

## 2015-09-30 NOTE — Progress Notes (Signed)
Angela Franco, Angela Franco ([id number]) Visit Report for 09/29/2015 Arrival Information Details Patient Name: [name], [name] Date of Service: 09/29/2015 9:30 AM Medical Record Patient Account Number: [id number] Number: Treating RN: Montey Hora 1929-08-07 (81 y.o. Other Clinician: Date of Birth/Sex: Female) Treating BURNS III, Primary Care Physician: Reymundo Poll Physician/Extender: Thayer Jew Referring Physician: Joycelyn Das in Treatment: 1 Visit Information History Since Last Visit Added or deleted any medications: No Patient Arrived: Ambulatory Any new allergies or adverse reactions: No Arrival Time: 09:48 Had a fall or experienced change in No Accompanied By: self activities of daily living that may affect Transfer Assistance: None risk of falls: Patient Identification Verified: Yes Signs or symptoms of abuse/neglect since last No Secondary Verification Process Yes visito Completed: Hospitalized since last visit: No Patient Requires Transmission- No Pain Present Now: No Based Precautions: Patient Has Alerts: Yes Patient Alerts: Patient on Blood Thinner ELiquis, ABI;L:1.13 R:1.06 Electronic Signature(s) Signed: 09/29/2015 5:13:41 PM By: Montey Hora Entered By: Montey Hora on 09/29/2015 09:58:30 Angela Franco ([id number]) -------------------------------------------------------------------------------- Clinic Level of Care Assessment Details Patient Name: Angela Franco Date of Service: 09/29/2015 9:30 AM Medical Record Patient Account Number: [id number] Number: Treating RN: Montey Hora [birth year] (81 y.o. Other Clinician: Date of Birth/Sex: Female) Treating BURNS III, Primary Care Physician: Reymundo Poll Physician/Extender: Thayer Jew Referring Physician: Joycelyn Das in Treatment: 1 Clinic Level of Care Assessment Items TOOL 4 Quantity Score []  - Use when only an EandM is performed on FOLLOW-UP visit 0 ASSESSMENTS - Nursing  Assessment / Reassessment X - Reassessment of Co-morbidities (includes updates in patient status) 1 10 X - Reassessment of Adherence to Treatment Plan 1 5 ASSESSMENTS - Wound and Skin Assessment / Reassessment X - Simple Wound Assessment / Reassessment - one wound 1 5 []  - Complex Wound Assessment / Reassessment - multiple wounds 0 []  - Dermatologic / Skin Assessment (not related to wound area) 0 ASSESSMENTS - Focused Assessment X - Circumferential Edema Measurements - multi extremities 1 5 []  - Nutritional Assessment / Counseling / Intervention 0 X - Lower Extremity Assessment (monofilament, tuning fork, pulses) 1 5 []  - Peripheral Arterial Disease Assessment (using hand held doppler) 0 ASSESSMENTS - Ostomy and/or Continence Assessment and Care []  - Incontinence Assessment and Management 0 []  - Ostomy Care Assessment and Management (repouching, etc.) 0 PROCESS - Coordination of Care X - Simple Patient / Family Education for ongoing care 1 15 []  - Complex (extensive) Patient / Family Education for ongoing care 0 []  - Staff obtains Programmer, systems, Records, Test Results / Process Orders 0 []  - Staff telephones HHA, Nursing Homes / Clarify orders / etc 0 Angela Franco, Angela Franco ([id number]) []  - Routine Transfer to another Facility (non-emergent condition) 0 []  - Routine Hospital Admission (non-emergent condition) 0 []  - New Admissions / Biomedical engineer / Ordering NPWT, Apligraf, etc. 0 []  - Emergency Hospital Admission (emergent condition) 0 X - Simple Discharge Coordination 1 10 []  - Complex (extensive) Discharge Coordination 0 PROCESS - Special Needs []  - Pediatric / Minor Patient Management 0 []  - Isolation Patient Management 0 []  - Hearing / Language / Visual special needs 0 []  - Assessment of Community assistance (transportation, D/C planning, etc.) 0 []  - Additional assistance / Altered mentation 0 []  - Support Surface(s) Assessment (bed, cushion, seat, etc.) 0 INTERVENTIONS - Wound  Cleansing / Measurement X - Simple Wound Cleansing - one wound 1 5 []  - Complex Wound Cleansing - multiple wounds 0 X - Wound Imaging (photographs - any number of wounds) 1 5 []  -  Wound Tracing (instead of photographs) 0 X - Simple Wound Measurement - one wound 1 5 []  - Complex Wound Measurement - multiple wounds 0 INTERVENTIONS - Wound Dressings X - Small Wound Dressing one or multiple wounds 1 10 []  - Medium Wound Dressing one or multiple wounds 0 []  - Large Wound Dressing one or multiple wounds 0 []  - Application of Medications - topical 0 []  - Application of Medications - injection 0 Angela Franco, Angela Franco ([id number]) INTERVENTIONS - Miscellaneous []  - External ear exam 0 []  - Specimen Collection (cultures, biopsies, blood, body fluids, etc.) 0 []  - Specimen(s) / Culture(s) sent or taken to Lab for analysis 0 []  - Patient Transfer (multiple staff / Harrel Lemon Lift / Similar devices) 0 []  - Simple Staple / Suture removal (25 or less) 0 []  - Complex Staple / Suture removal (26 or more) 0 []  - Hypo / Hyperglycemic Management (close monitor of Blood Glucose) 0 []  - Ankle / Brachial Index (ABI) - do not check if billed separately 0 X - Vital Signs 1 5 Has the patient been seen at the hospital within the last three years: Yes Total Score: 85 Level Of Care: New/Established - Level 3 Electronic Signature(s) Signed: 09/29/2015 5:13:41 PM By: Montey Hora Entered By: Montey Hora on 09/29/2015 10:17:00 Angela Franco ([id number]) -------------------------------------------------------------------------------- Encounter Discharge Information Details Patient Name: Angela Franco Date of Service: 09/29/2015 9:30 AM Medical Record Patient Account Number: [id number] Number: Treating RN: Montey Hora [birth year] (81 y.o. Other Clinician: Date of Birth/Sex: Female) Treating BURNS III, Primary Care Physician: Reymundo Poll Physician/Extender: Thayer Jew Referring Physician: Joycelyn Das in Treatment: 1 Encounter Discharge Information Items Discharge Pain Level: 0 Discharge Condition: Stable Ambulatory Status: Ambulatory Discharge Destination: Home Transportation: Private Auto Accompanied By: self Schedule Follow-up Appointment: Yes Medication Reconciliation completed and provided to Patient/Care No Isa Kohlenberg: Provided on Clinical Summary of Care: 09/29/2015 Form Type Recipient Paper Patient DP Electronic Signature(s) Signed: 09/29/2015 10:24:19 AM By: Ruthine Dose Entered By: Ruthine Dose on 09/29/2015 10:24:19 Angela Franco ([id number]) -------------------------------------------------------------------------------- Lower Extremity Assessment Details Patient Name: Angela Franco Date of Service: 09/29/2015 9:30 AM Medical Record Patient Account Number: [id number] Number: Treating RN: Montey Hora 02-01-1929 (81 y.o. Other Clinician: Date of Birth/Sex: Female) Treating BURNS III, Primary Care Physician: Reymundo Poll Physician/Extender: Thayer Jew Referring Physician: Joycelyn Das in Treatment: 1 Edema Assessment Assessed: [Left: No] [Right: No] Edema: [Left: Ye] [Right: s] Calf Left: Right: Point of Measurement: 35 cm From Medial Instep cm 35.8 cm Ankle Left: Right: Point of Measurement: 10 cm From Medial Instep cm 23 cm Vascular Assessment Pulses: Posterior Tibial Dorsalis Pedis Palpable: [Right:Yes] Extremity colors, hair growth, and conditions: Extremity Color: [Right:Red] Hair Growth on Extremity: [Right:No] Temperature of Extremity: [Right:Warm] Capillary Refill: [Right:< 3 seconds] Toe Nail Assessment Left: Right: Thick: Yes Discolored: No Deformed: No Improper Length and Hygiene: No Electronic Signature(s) Signed: 09/29/2015 5:13:41 PM By: Montey Hora Entered By: Montey Hora on 09/29/2015 09:59:44 Angela Franco, Angela Franco ([id number]) Angela Franco, Angela Franco  ([id number]) -------------------------------------------------------------------------------- Multi Wound Chart Details Patient Name: Angela Franco Date of Service: 09/29/2015 9:30 AM Medical Record Patient Account Number: [id number] Number: Treating RN: Montey Hora July 20, 1929 (81 y.o. Other Clinician: Date of Birth/Sex: Female) Treating BURNS III, Primary Care Physician: Reymundo Poll Physician/Extender: Thayer Jew Referring Physician: Joycelyn Das in Treatment: 1 Photos: [1:No Photos] [N/A:N/A] Wound Location: [1:Right Lower Leg - Anterior N/A] Wounding Event: [1:Trauma] [N/A:N/A] Primary Etiology: [1:Trauma, Other] [N/A:N/A] Comorbid History: [1:Cataracts, Anemia, Arrhythmia, Hypertension, Osteoarthritis] [N/A:N/A] Date Acquired: [1:08/29/2015] [N/A:N/A] Weeks of  Treatment: [1:1] [N/A:N/A] Wound Status: [1:Open] [N/A:N/A] Measurements L x W x D 2.4x2.1x0.1 [N/A:N/A] (cm) Area (cm) : [1:3.958] [N/A:N/A] Volume (cm) : [1:0.396] [N/A:N/A] % Reduction in Area: [1:30.00%] [N/A:N/A] % Reduction in Volume: 29.90% [N/A:N/A] Classification: [1:Partial Thickness] [N/A:N/A] Exudate Amount: [1:Small] [N/A:N/A] Wound Margin: [1:Indistinct, nonvisible] [N/A:N/A] Granulation Amount: [1:Small (1-33%)] [N/A:N/A] Granulation Quality: [1:Red] [N/A:N/A] Necrotic Amount: [1:Large (67-100%)] [N/A:N/A] Necrotic Tissue: [1:Eschar, Adherent Slough N/A] Exposed Structures: [1:Fascia: No Fat: No Tendon: No Muscle: No Joint: No Bone: No Limited to Skin Breakdown] [N/A:N/A] Epithelialization: [1:None] [N/A:N/A] Periwound Skin Texture: Edema: Yes [1:Excoriation: No Induration: No Callus: No] [N/A:N/A] Crepitus: No Fluctuance: No Friable: No Rash: No Scarring: No Periwound Skin Moist: Yes N/A N/A Moisture: Maceration: No Dry/Scaly: No Periwound Skin Color: Atrophie Blanche: No N/A N/A Cyanosis: No Ecchymosis: No Erythema: No Hemosiderin Staining: No Mottled: No Pallor:  No Rubor: No Temperature: No Abnormality N/A N/A Tenderness on No N/A N/A Palpation: Wound Preparation: Ulcer Cleansing: N/A N/A Rinsed/Irrigated with Saline Topical Anesthetic Applied: Other: Lidocaine 4% Treatment Notes Electronic Signature(s) Signed: 09/29/2015 5:13:41 PM By: Montey Hora Entered By: Montey Hora on 09/29/2015 10:00:32 Angela Franco, Angela Franco ([id number]) -------------------------------------------------------------------------------- Multi-Disciplinary Care Plan Details Patient Name: Angela Franco Date of Service: 09/29/2015 9:30 AM Medical Record Patient Account Number: [id number] Number: Treating RN: Montey Hora 10/28/1929 (81 y.o. Other Clinician: Date of Birth/Sex: Female) Treating BURNS III, Primary Care Physician: Reymundo Poll Physician/Extender: Thayer Jew Referring Physician: Joycelyn Das in Treatment: 1 Active Inactive Abuse / Safety / Falls / Self Care Management Nursing Diagnoses: Potential for falls Self care deficit: actual or potential Goals: Patient will remain injury free Date Initiated: 09/22/2015 Goal Status: Active Patient/caregiver will verbalize/demonstrate measures taken to improve the patient's personal safety Date Initiated: 09/22/2015 Goal Status: Active Patient/caregiver will verbalize/demonstrate measures taken to prevent injury and/or falls Date Initiated: 09/22/2015 Goal Status: Active Interventions: Assess fall risk on admission and as needed Assess: immobility, friction, shearing, incontinence upon admission and as needed Assess self care needs on admission and as needed Provide education on basic hygiene Provide education on fall prevention Provide education on personal and home safety Provide education on safe transfers Notes: Orientation to the Wound Care Program Nursing Diagnoses: Knowledge deficit related to the wound healing center program Goals: Angela Franco, Angela Franco  ([id number]) Patient/caregiver will verbalize understanding of the [facility] Program Date Initiated: 09/22/2015 Goal Status: Active Interventions: Provide education on orientation to the wound center Notes: Wound/Skin Impairment Nursing Diagnoses: Impaired tissue integrity Knowledge deficit related to ulceration/compromised skin integrity Goals: Ulcer/skin breakdown will have a volume reduction of 50% by week 8 Date Initiated: 09/22/2015 Goal Status: Active Ulcer/skin breakdown will have a volume reduction of 80% by week 12 Date Initiated: 09/22/2015 Goal Status: Active Ulcer/skin breakdown will heal within 14 weeks Date Initiated: 09/22/2015 Goal Status: Active Interventions: Assess patient/caregiver ability to obtain necessary supplies Assess patient/caregiver ability to perform ulcer/skin care regimen upon admission and as needed Assess ulceration(s) every visit Provide education on ulcer and skin care Treatment Activities: Referred to DME Angela Franco for dressing supplies : 09/29/2015 Skin care regimen initiated : 09/29/2015 Notes: Electronic Signature(s) Signed: 09/29/2015 5:13:41 PM By: Montey Hora Entered By: Montey Hora on 09/29/2015 10:00:22 Angela Franco ([id number]) -------------------------------------------------------------------------------- Patient/Caregiver Education Details Patient Name: Angela Franco Date of Service: 09/29/2015 9:30 AM Medical Record Patient Account Number: [id number] Number: Treating RN: Montey Hora Dec 09, 1928 (81 y.o. Other Clinician: Date of Birth/Gender: Female) Treating BURNS III, Primary Care Physician: Reymundo Poll Physician/Extender: Thayer Jew Referring Physician: Joycelyn Das  in Treatment: 1 Education Assessment Education Provided To: Patient Education Topics Provided Wound/Skin Impairment: Handouts: Other: wound care as ordered Methods: Demonstration, Explain/Verbal Responses: State  content correctly Electronic Signature(s) Signed: 09/29/2015 5:13:41 PM By: Montey Hora Entered By: Montey Hora on 09/29/2015 10:24:20 Angela Franco ([id number]) -------------------------------------------------------------------------------- Wound Assessment Details Patient Name: Angela Franco Date of Service: 09/29/2015 9:30 AM Medical Record Patient Account Number: [id number] Number: Treating RN: Montey Hora 1929/03/17 (81 y.o. Other Clinician: Date of Birth/Sex: Female) Treating BURNS III, Primary Care Physician: Reymundo Poll Physician/Extender: Thayer Jew Referring Physician: Joycelyn Das in Treatment: 1 Wound Status Wound Number: 1 Primary Trauma, Other Etiology: Wound Location: Right Lower Leg - Anterior Wound Open Wounding Event: Trauma Status: Date Acquired: 08/29/2015 Comorbid Cataracts, Anemia, Arrhythmia, Weeks Of Treatment: 1 History: Hypertension, Osteoarthritis Clustered Wound: No Photos Photo Uploaded By: Gretta Cool, RN, BSN, Kim on 09/29/2015 17:09:10 Wound Measurements Length: (cm) 2.4 Width: (cm) 2.1 Depth: (cm) 0.1 Area: (cm) 3.958 Volume: (cm) 0.396 % Reduction in Area: 30% % Reduction in Volume: 29.9% Epithelialization: None Tunneling: No Undermining: No Wound Description Classification: Partial Thickness Foul Odor After Wound Margin: Indistinct, nonvisible Exudate Amount: Small Cleansing: No Wound Bed Granulation Amount: Small (1-33%) Exposed Structure Granulation Quality: Red Fascia Exposed: No Necrotic Amount: Large (67-100%) Fat Layer Exposed: No Necrotic Quality: Eschar, Adherent Slough Tendon Exposed: No Muscle Exposed: No Angela Franco, Angela Franco ([id number]) Joint Exposed: No Bone Exposed: No Limited to Skin Breakdown Periwound Skin Texture Texture Color No Abnormalities Noted: No No Abnormalities Noted: No Callus: No Atrophie Blanche: No Crepitus: No Cyanosis: No Excoriation: No Ecchymosis:  No Fluctuance: No Erythema: No Friable: No Hemosiderin Staining: No Induration: No Mottled: No Localized Edema: Yes Pallor: No Rash: No Rubor: No Scarring: No Temperature / Pain Moisture Temperature: No Abnormality No Abnormalities Noted: No Dry / Scaly: No Maceration: No Moist: Yes Wound Preparation Ulcer Cleansing: Rinsed/Irrigated with Saline Topical Anesthetic Applied: Other: Lidocaine 4%, Treatment Notes Wound #1 (Right, Anterior Lower Leg) 1. Cleansed with: Clean wound with Normal Saline 2. Anesthetic Topical Lidocaine 4% cream to wound bed prior to debridement 3. Peri-wound Care: Skin Prep 4. Dressing Applied: Prisma Ag 5. Secondary Dressing Applied Bordered Foam Dressing 7. Secured with Tubigrip Electronic Signature(s) Signed: 09/29/2015 5:13:41 PM By: Montey Hora Entered By: Montey Hora on 09/29/2015 10:00:15 Angela Franco ([id number]) -------------------------------------------------------------------------------- Vitals Details Patient Name: Angela Franco Date of Service: 09/29/2015 9:30 AM Medical Record Patient Account Number: [id number] Number: Treating RN: Montey Hora February 15, 1929 (81 y.o. Other Clinician: Date of Birth/Sex: Female) Treating BURNS III, Primary Care Physician: Reymundo Poll Physician/Extender: Thayer Jew Referring Physician: Joycelyn Das in Treatment: 1 Vital Signs Time Taken: 10:16 Temperature (F): 98.3 Height (in): 65 Pulse (bpm): 72 Weight (lbs): 147 Respiratory Rate (breaths/min): 18 Body Mass Index (BMI): 24.5 Blood Pressure (mmHg): 136/82 Reference Range: 80 - 120 mg / dl Electronic Signature(s) Signed: 09/29/2015 5:13:41 PM By: Montey Hora Entered By: Montey Hora on 09/29/2015 10:33:34

## 2015-10-06 ENCOUNTER — Encounter: Payer: Medicare Other | Attending: Surgery | Admitting: Surgery

## 2015-10-06 DIAGNOSIS — R600 Localized edema: Secondary | ICD-10-CM | POA: Insufficient documentation

## 2015-10-06 DIAGNOSIS — N183 Chronic kidney disease, stage 3 (moderate): Secondary | ICD-10-CM | POA: Insufficient documentation

## 2015-10-06 DIAGNOSIS — E7800 Pure hypercholesterolemia, unspecified: Secondary | ICD-10-CM | POA: Insufficient documentation

## 2015-10-06 DIAGNOSIS — I120 Hypertensive chronic kidney disease with stage 5 chronic kidney disease or end stage renal disease: Secondary | ICD-10-CM | POA: Insufficient documentation

## 2015-10-06 DIAGNOSIS — I482 Chronic atrial fibrillation: Secondary | ICD-10-CM | POA: Insufficient documentation

## 2015-10-06 DIAGNOSIS — M1990 Unspecified osteoarthritis, unspecified site: Secondary | ICD-10-CM | POA: Diagnosis not present

## 2015-10-06 DIAGNOSIS — L97212 Non-pressure chronic ulcer of right calf with fat layer exposed: Secondary | ICD-10-CM | POA: Diagnosis not present

## 2015-10-07 NOTE — Progress Notes (Signed)
Angela Franco, Angela Franco ([id number]) Visit Report for 10/06/2015 Arrival Information Details Patient Name: Angela Franco, Angela Franco Date of Service: 10/06/2015 2:15 PM Medical Record Patient Account Number: [id number] Number: Treating RN: Montey Hora 1929/08/19 (81 y.o. Other Clinician: Date of Birth/Sex: Female) Treating BURNS III, Primary Care Physician: Reymundo Poll Physician/Extender: Thayer Jew Referring Physician: Joycelyn Das in Treatment: 2 Visit Information History Since Last Visit Added or deleted any medications: No Patient Arrived: Ambulatory Any new allergies or adverse reactions: No Arrival Time: 14:23 Had a fall or experienced change in No Accompanied By: dtr activities of daily living that may affect Transfer Assistance: None risk of falls: Patient Identification Verified: Yes Signs or symptoms of abuse/neglect since last No Secondary Verification Process Yes visito Completed: Hospitalized since last visit: No Patient Requires Transmission- No Pain Present Now: No Based Precautions: Patient Has Alerts: Yes Patient Alerts: Patient on Blood Thinner ELiquis, ABI;L:1.13 R:1.06 Electronic Signature(s) Signed: 10/06/2015 5:24:05 PM By: Montey Hora Entered By: Montey Hora on 10/06/2015 14:26:22 Angela Franco ([id number]) -------------------------------------------------------------------------------- Encounter Discharge Information Details Patient Name: Angela Franco Date of Service: 10/06/2015 2:15 PM Medical Record Patient Account Number: [id number] Number: Treating RN: Montey Hora 1929-04-15 (81 y.o. Other Clinician: Date of Birth/Sex: Female) Treating BURNS III, Primary Care Physician: Reymundo Poll Physician/Extender: Thayer Jew Referring Physician: Joycelyn Das in Treatment: 2 Encounter Discharge Information Items Discharge Pain Level: 0 Discharge Condition: Stable Ambulatory Status: Ambulatory Discharge Destination:  Home Transportation: Private Auto Accompanied By: self Schedule Follow-up Appointment: Yes Medication Reconciliation completed and provided to Patient/Care Yes Blain Hunsucker: Provided on Clinical Summary of Care: 10/06/2015 Form Type Recipient Paper Patient DP Electronic Signature(s) Signed: 10/06/2015 5:27:52 PM By: Gretta Cool RN, BSN, Kim RN, BSN Previous Signature: 10/06/2015 2:49:34 PM Version By: Ruthine Dose Entered By: Gretta Cool RN, BSN, Kim on 10/06/2015 14:51:57 Angela Franco ([id number]) -------------------------------------------------------------------------------- Lower Extremity Assessment Details Patient Name: Angela Franco Date of Service: 10/06/2015 2:15 PM Medical Record Patient Account Number: [id number] Number: Treating RN: Montey Hora Mar 13, 1929 (81 y.o. Other Clinician: Date of Birth/Sex: Female) Treating BURNS III, Primary Care Physician: Reymundo Poll Physician/Extender: Thayer Jew Referring Physician: Joycelyn Das in Treatment: 2 Edema Assessment Assessed: [Left: No] [Right: No] Edema: [Left: Ye] [Right: s] Calf Left: Right: Point of Measurement: 35 cm From Medial Instep cm 36.2 cm Ankle Left: Right: Point of Measurement: 10 cm From Medial Instep cm 23.4 cm Vascular Assessment Pulses: Posterior Tibial Dorsalis Pedis Palpable: [Right:Yes] Extremity colors, hair growth, and conditions: Extremity Color: [Right:Hyperpigmented] Hair Growth on Extremity: [Right:No] Temperature of Extremity: [Right:Warm] Capillary Refill: [Right:< 3 seconds] Toe Nail Assessment Left: Right: Thick: Yes Discolored: Yes Deformed: No Improper Length and Hygiene: No Electronic Signature(s) Signed: 10/06/2015 5:24:05 PM By: Montey Hora Entered By: Montey Hora on 10/06/2015 14:30:13 Angela Franco ([id number]) Florene Glen, Kahlan ([id number]) -------------------------------------------------------------------------------- Multi Wound Chart Details Patient  Name: Angela Franco Date of Service: 10/06/2015 2:15 PM Medical Record Patient Account Number: [id number] Number: Treating RN: Montey Hora 09-Jan-1929 (81 y.o. Other Clinician: Date of Birth/Sex: Female) Treating BURNS III, Primary Care Physician: Reymundo Poll Physician/Extender: Thayer Jew Referring Physician: Joycelyn Das in Treatment: 2 Vital Signs Height(in): 65 Pulse(bpm): 69 Weight(lbs): 147 Blood Pressure 131/69 (mmHg): Body Mass Index(BMI): 24 Temperature(F): 98.1 Respiratory Rate 18 (breaths/min): Photos: [1:No Photos] [N/A:N/A] Wound Location: [1:Right Lower Leg - Anterior N/A] Wounding Event: [1:Trauma] [N/A:N/A] Primary Etiology: [1:Trauma, Other] [N/A:N/A] Comorbid History: [1:Cataracts, Anemia, Arrhythmia, Hypertension, Osteoarthritis] [N/A:N/A] Date Acquired: [1:08/29/2015] [N/A:N/A] Weeks of Treatment: [1:2] [N/A:N/A] Wound Status: [1:Open] [N/A:N/A] Measurements L x W x D  2.3x1.2x0.1 [N/A:N/A] (cm) Area (cm) : [1:2.168] [N/A:N/A] Volume (cm) : [1:0.217] [N/A:N/A] % Reduction in Area: [1:61.70%] [N/A:N/A] % Reduction in Volume: 61.60% [N/A:N/A] Classification: [1:Partial Thickness] [N/A:N/A] Exudate Amount: [1:Small] [N/A:N/A] Wound Margin: [1:Indistinct, nonvisible] [N/A:N/A] Granulation Amount: [1:Large (67-100%)] [N/A:N/A] Granulation Quality: [1:Red] [N/A:N/A] Necrotic Amount: [1:Small (1-33%)] [N/A:N/A] Necrotic Tissue: [1:Eschar, Adherent Slough N/A] Exposed Structures: [1:Fascia: No Fat: No Tendon: No Muscle: No Joint: No] [N/A:N/A] Bone: No Limited to Skin Breakdown Epithelialization: Medium (34-66%) N/A N/A Periwound Skin Texture: Edema: Yes N/A N/A Excoriation: No Induration: No Callus: No Crepitus: No Fluctuance: No Friable: No Rash: No Scarring: No Periwound Skin Moist: Yes N/A N/A Moisture: Maceration: No Dry/Scaly: No Periwound Skin Color: Atrophie Blanche: No N/A N/A Cyanosis: No Ecchymosis:  No Erythema: No Hemosiderin Staining: No Mottled: No Pallor: No Rubor: No Temperature: No Abnormality N/A N/A Tenderness on No N/A N/A Palpation: Wound Preparation: Ulcer Cleansing: N/A N/A Rinsed/Irrigated with Saline Topical Anesthetic Applied: Other: Lidocaine 4% Treatment Notes Electronic Signature(s) Signed: 10/06/2015 5:24:05 PM By: Montey Hora Entered By: Montey Hora on 10/06/2015 14:37:57 Angela Franco, Angela Franco ([id number]) -------------------------------------------------------------------------------- Multi-Disciplinary Care Plan Details Patient Name: Angela Franco Date of Service: 10/06/2015 2:15 PM Medical Record Patient Account Number: [id number] Number: Treating RN: Montey Hora 10-Dec-1928 (81 y.o. Other Clinician: Date of Birth/Sex: Female) Treating BURNS III, Primary Care Physician: Reymundo Poll Physician/Extender: Thayer Jew Referring Physician: Joycelyn Das in Treatment: 2 Active Inactive Abuse / Safety / Falls / Self Care Management Nursing Diagnoses: Potential for falls Self care deficit: actual or potential Goals: Patient will remain injury free Date Initiated: 09/22/2015 Goal Status: Active Patient/caregiver will verbalize/demonstrate measures taken to improve the patient's personal safety Date Initiated: 09/22/2015 Goal Status: Active Patient/caregiver will verbalize/demonstrate measures taken to prevent injury and/or falls Date Initiated: 09/22/2015 Goal Status: Active Interventions: Assess fall risk on admission and as needed Assess: immobility, friction, shearing, incontinence upon admission and as needed Assess self care needs on admission and as needed Provide education on basic hygiene Provide education on fall prevention Provide education on personal and home safety Provide education on safe transfers Notes: Orientation to the Wound Care Program Nursing Diagnoses: Knowledge deficit related to the wound healing  center program Goals: Angela Franco, Angela Franco ([id number]) Patient/caregiver will verbalize understanding of the [facility] Date Initiated: 09/22/2015 Goal Status: Active Interventions: Provide education on orientation to the wound center Notes: Wound/Skin Impairment Nursing Diagnoses: Impaired tissue integrity Knowledge deficit related to ulceration/compromised skin integrity Goals: Ulcer/skin breakdown will have a volume reduction of 50% by week 8 Date Initiated: 09/22/2015 Goal Status: Active Ulcer/skin breakdown will have a volume reduction of 80% by week 12 Date Initiated: 09/22/2015 Goal Status: Active Ulcer/skin breakdown will heal within 14 weeks Date Initiated: 09/22/2015 Goal Status: Active Interventions: Assess patient/caregiver ability to obtain necessary supplies Assess patient/caregiver ability to perform ulcer/skin care regimen upon admission and as needed Assess ulceration(s) every visit Provide education on ulcer and skin care Treatment Activities: Referred to DME Rodman Recupero for dressing supplies : 10/06/2015 Skin care regimen initiated : 10/06/2015 Notes: Electronic Signature(s) Signed: 10/06/2015 5:24:05 PM By: Montey Hora Entered By: Montey Hora on 10/06/2015 14:37:51 Angela Franco ([id number]) -------------------------------------------------------------------------------- Patient/Caregiver Education Details Patient Name: Angela Franco Date of Service: 10/06/2015 2:15 PM Medical Record Patient Account Number: [id number] Number: Treating RN: Montey Hora [birth year] (81 y.o. Other Clinician: Date of Birth/Gender: Female) Treating BURNS III, Primary Care Physician: Reymundo Poll Physician/Extender: Thayer Jew Referring Physician: Joycelyn Das in Treatment: 2 Education Assessment Education Provided To: Patient and  Caregiver Education Topics Provided Venous: Handouts: Other: leg elevation for edema Methods:  Explain/Verbal Responses: State content correctly Wound/Skin Impairment: Handouts: Other: wound care as ordered Methods: Demonstration, Explain/Verbal Responses: State content correctly Electronic Signature(s) Signed: 10/06/2015 5:24:05 PM By: Montey Hora Entered By: Montey Hora on 10/06/2015 14:33:27 Angela Franco, Angela Franco ([id number]) -------------------------------------------------------------------------------- Wound Assessment Details Patient Name: Angela Franco Date of Service: 10/06/2015 2:15 PM Medical Record Patient Account Number: [id number] Number: Treating RN: Montey Hora July 23, 1929 (81 y.o. Other Clinician: Date of Birth/Sex: Female) Treating BURNS III, Primary Care Physician: Reymundo Poll Physician/Extender: Thayer Jew Referring Physician: Joycelyn Das in Treatment: 2 Wound Status Wound Number: 1 Primary Trauma, Other Etiology: Wound Location: Right Lower Leg - Anterior Wound Open Wounding Event: Trauma Status: Date Acquired: 08/29/2015 Comorbid Cataracts, Anemia, Arrhythmia, Weeks Of Treatment: 2 History: Hypertension, Osteoarthritis Clustered Wound: No Photos Photo Uploaded By: Montey Hora on 10/06/2015 15:04:38 Wound Measurements Length: (cm) 2.3 Width: (cm) 1.2 Depth: (cm) 0.1 Area: (cm) 2.168 Volume: (cm) 0.217 % Reduction in Area: 61.7% % Reduction in Volume: 61.6% Epithelialization: Medium (34-66%) Tunneling: No Undermining: No Wound Description Classification: Partial Thickness Foul Odor After Wound Margin: Indistinct, nonvisible Exudate Amount: Small Cleansing: No Wound Bed Granulation Amount: Large (67-100%) Exposed Structure Granulation Quality: Red Fascia Exposed: No Necrotic Amount: Small (1-33%) Fat Layer Exposed: No Necrotic Quality: Eschar, Adherent Slough Tendon Exposed: No Muscle Exposed: No Angela Franco, Angela Franco ([id number]) Joint Exposed: No Bone Exposed: No Limited to Skin Breakdown Periwound Skin  Texture Texture Color No Abnormalities Noted: No No Abnormalities Noted: No Callus: No Atrophie Blanche: No Crepitus: No Cyanosis: No Excoriation: No Ecchymosis: No Fluctuance: No Erythema: No Friable: No Hemosiderin Staining: No Induration: No Mottled: No Localized Edema: Yes Pallor: No Rash: No Rubor: No Scarring: No Temperature / Pain Moisture Temperature: No Abnormality No Abnormalities Noted: No Dry / Scaly: No Maceration: No Moist: Yes Wound Preparation Ulcer Cleansing: Rinsed/Irrigated with Saline Topical Anesthetic Applied: Other: Lidocaine 4%, Treatment Notes Wound #1 (Right, Anterior Lower Leg) 1. Cleansed with: Clean wound with Normal Saline 2. Anesthetic Topical Lidocaine 4% cream to wound bed prior to debridement 4. Dressing Applied: Prisma Ag 5. Secondary Dressing Applied Bordered Foam Dressing Electronic Signature(s) Signed: 10/06/2015 5:24:05 PM By: Montey Hora Entered By: Montey Hora on 10/06/2015 14:37:35 Angela Franco ([id number]) -------------------------------------------------------------------------------- Vitals Details Patient Name: Angela Franco Date of Service: 10/06/2015 2:15 PM Medical Record Patient Account Number: [id number] Number: Treating RN: Montey Hora Apr 06, 1929 (81 y.o. Other Clinician: Date of Birth/Sex: Female) Treating BURNS III, Primary Care Physician: Reymundo Poll Physician/Extender: Thayer Jew Referring Physician: Joycelyn Das in Treatment: 2 Vital Signs Time Taken: 14:26 Temperature (F): 98.1 Height (in): 65 Pulse (bpm): 69 Weight (lbs): 147 Respiratory Rate (breaths/min): 18 Body Mass Index (BMI): 24.5 Blood Pressure (mmHg): 131/69 Reference Range: 80 - 120 mg / dl Electronic Signature(s) Signed: 10/06/2015 5:24:05 PM By: Montey Hora Entered By: Montey Hora on 10/06/2015 14:26:50

## 2015-10-07 NOTE — Progress Notes (Signed)
Angela Franco ([id number]) Visit Report for 10/06/2015 Chief Complaint Document Details Patient Name: Angela Franco, Angela Franco Date of Service: 10/06/2015 2:15 PM Medical Record Patient Account Number: [id number] Number: Treating RN: Montey Hora 1929/07/24 (81 y.o. Other Clinician: Date of Birth/Sex: Female) Treating BURNS III, Primary Care Physician/Extender: Betsey Holiday Physician: Referring Physician: Joycelyn Das in Treatment: 2 Information Obtained from: Patient Chief Complaint Right calf traumatic ulceration. Electronic Signature(s) Signed: 10/06/2015 4:23:42 PM By: Loletha Grayer MD Entered By: Loletha Grayer on 10/06/2015 15:02:00 Angela Franco ([id number]) -------------------------------------------------------------------------------- Debridement Details Patient Name: Angela Franco Date of Service: 10/06/2015 2:15 PM Medical Record Patient Account Number: [id number] Number: Treating RN: Montey Hora [birth year] (81 y.o. Other Clinician: Date of Birth/Sex: Female) Treating BURNS III, Primary Care Physician/Extender: Glena Norfolk, Mallie Mussel Physician: Referring Physician: Joycelyn Das in Treatment: 2 Debridement Performed for Wound #1 Right,Anterior Lower Leg Assessment: Performed By: Physician BURNS III, Teressa Senter., MD Debridement: Open Wound/Selective Debridement Selective Description: Pre-procedure Yes Verification/Time Out Taken: Start Time: 14:45 Pain Control: Other : lidocaine 4% Level: Non-Viable Tissue Total Area Debrided (L x 2.3 (cm) x 1.2 (cm) = 2.76 (cm) W): Tissue and other Non-Viable, Eschar, Exudate, Fibrin/Slough material debrided: Instrument: Forceps Bleeding: None End Time: 14:50 Procedural Pain: 0 Post Procedural Pain: 0 Response to Treatment: Procedure was tolerated well Post Debridement Measurements of Total Wound Length: (cm) 2.3 Width: (cm) 1.2 Depth: (cm) 0.1 Volume: (cm)  0.217 Post Procedure Diagnosis Same as Pre-procedure Electronic Signature(s) Signed: 10/06/2015 4:23:42 PM By: Loletha Grayer MD Signed: 10/06/2015 5:24:05 PM By: Montey Hora Entered By: Loletha Grayer on 10/06/2015 15:01:52 Angela Franco, Angela Franco ([id number]) Angela Franco, Angela Franco ([id number]) -------------------------------------------------------------------------------- HPI Details Patient Name: Angela Franco Date of Service: 10/06/2015 2:15 PM Medical Record Patient Account Number: [id number] Number: Treating RN: Montey Hora 11-24-1929 (81 y.o. Other Clinician: Date of Birth/Sex: Female) Treating BURNS III, Primary Care Physician/Extender: Betsey Holiday Physician: Referring Physician: Joycelyn Das in Treatment: 2 History of Present Illness HPI Description: Very pleasant 81 year old with history of atrial fibrillation (on Eliquis) and chronic kidney disease. No history of diabetes or PAD. Right ABI 1.06. She fell and traumatized her right anterior calf in September 2016. She describes significant swelling and bruising. She reportedly developed cellulitis, and completed a course of levofloxacin. Ambulating per her baseline. No significant pain. No ischemic rest pain or claudication. Performing dressing changes with Prisma and using a Tubigrip for edema control. No fever or chills. Minimal serosanguineous drainage. Electronic Signature(s) Signed: 10/06/2015 4:23:42 PM By: Loletha Grayer MD Entered By: Loletha Grayer on 10/06/2015 15:02:18 Angela Franco, Angela Franco ([id number]) -------------------------------------------------------------------------------- Physical Exam Details Patient Name: Angela Franco Date of Service: 10/06/2015 2:15 PM Medical Record Patient Account Number: [id number] Number: Treating RN: Montey Hora 1929/01/15 (81 y.o. Other Clinician: Date of Birth/Sex: Female) Treating BURNS III, Primary Care Physician/Extender:  Betsey Holiday Physician: Referring Physician: Joycelyn Das in Treatment: 2 Constitutional . Pulse regular. Respirations normal and unlabored. Afebrile. . Notes Right anterior calf ulceration improved. Full-thickness. Healthy granulating base. Minimal eschar removed. One plus pitting edema. No significant cellulitis. Palpable DP. Right ABI 1.06. Electronic Signature(s) Signed: 10/06/2015 4:23:42 PM By: Loletha Grayer MD Entered By: Loletha Grayer on 10/06/2015 15:03:03 Angela Franco ([id number]) -------------------------------------------------------------------------------- Physician Orders Details Patient Name: Angela Franco Date of Service: 10/06/2015 2:15 PM Medical Record Patient Account Number: [id number] Number: Treating RN: Cornell Barman 02/25/1929 (81 y.o. Other Clinician: Date of Birth/Sex: Female) Treating BURNS III, Primary Care Physician/Extender: Thayer Jew  Angela Franco Physician: Referring Physician: Joycelyn Das in Treatment: 2 Verbal / Phone Orders: Yes Clinician: Cornell Barman Read Back and Verified: Yes Diagnosis Coding ICD-10 Coding Code Description [phone] Non-pressure chronic ulcer of right calf with fat layer exposed I48.2 Chronic atrial fibrillation N18.3 Chronic kidney disease, stage 3 (moderate) R60.0 Localized edema Wound Cleansing Wound #1 Right,Anterior Lower Leg o Cleanse wound with mild soap and water o May Shower, gently pat wound dry prior to applying new dressing. Anesthetic Wound #1 Right,Anterior Lower Leg o Topical Lidocaine 4% cream applied to wound bed prior to debridement Primary Wound Dressing Wound #1 Right,Anterior Lower Leg o Prisma Ag - or collagen with silver equivalent Secondary Dressing Wound #1 Right,Anterior Lower Leg o Boardered Foam Dressing Dressing Change Frequency o Change dressing every other day. Follow-up Appointments Wound #1 Right,Anterior Lower Leg o Return  Appointment in 1 week. Edema Control Angela Franco, Angela Franco ([id number]) Wound #1 Right,Anterior Lower Leg o Tubigrip Electronic Signature(s) Signed: 10/06/2015 4:23:42 PM By: Loletha Grayer MD Signed: 10/06/2015 5:27:52 PM By: Gretta Cool, RN, BSN, Kim RN, BSN Entered By: Gretta Cool, RN, BSN, Kim on 10/06/2015 14:49:12 Angela Franco ([id number]) -------------------------------------------------------------------------------- Problem List Details Patient Name: Angela Franco Date of Service: 10/06/2015 2:15 PM Medical Record Patient Account Number: [id number] Number: Treating RN: Montey Hora 08-22-1929 (81 y.o. Other Clinician: Date of Birth/Sex: Female) Treating BURNS III, Primary Care Physician/Extender: Betsey Holiday Physician: Referring Physician: Joycelyn Das in Treatment: 2 Active Problems ICD-10 Encounter Code Description Active Date Diagnosis L97.212 Non-pressure chronic ulcer of right calf with fat layer 09/22/2015 Yes exposed I48.2 Chronic atrial fibrillation 09/22/2015 Yes N18.3 Chronic kidney disease, stage 3 (moderate) 09/22/2015 Yes R60.0 Localized edema 09/22/2015 Yes Inactive Problems Resolved Problems Electronic Signature(s) Signed: 10/06/2015 4:23:42 PM By: Loletha Grayer MD Entered By: Loletha Grayer on 10/06/2015 15:01:29 Angela Franco ([id number]) -------------------------------------------------------------------------------- Progress Note Details Patient Name: Angela Franco Date of Service: 10/06/2015 2:15 PM Medical Record Patient Account Number: [id number] Number: Treating RN: Montey Hora Aug 13, 1929 (81 y.o. Other Clinician: Date of Birth/Sex: Female) Treating BURNS III, Primary Care Physician/Extender: Betsey Holiday Physician: Referring Physician: Joycelyn Das in Treatment: 2 Subjective Chief Complaint Information obtained from Patient Right calf traumatic ulceration. History of Present  Illness (HPI) Very pleasant 81 year old with history of atrial fibrillation (on Eliquis) and chronic kidney disease. No history of diabetes or PAD. Right ABI 1.06. She fell and traumatized her right anterior calf in September 2016. She describes significant swelling and bruising. She reportedly developed cellulitis, and completed a course of levofloxacin. Ambulating per her baseline. No significant pain. No ischemic rest pain or claudication. Performing dressing changes with Prisma and using a Tubigrip for edema control. No fever or chills. Minimal serosanguineous drainage. Objective Constitutional Pulse regular. Respirations normal and unlabored. Afebrile. Vitals Time Taken: 2:26 PM, Height: 65 in, Weight: 147 lbs, BMI: 24.5, Temperature: 98.1 F, Pulse: 69 bpm, Respiratory Rate: 18 breaths/min, Blood Pressure: 131/69 mmHg. General Notes: Right anterior calf ulceration improved. Full-thickness. Healthy granulating base. Minimal eschar removed. One plus pitting edema. No significant cellulitis. Palpable DP. Right ABI 1.06. Integumentary (Hair, Skin) Wound #1 status is Open. Original cause of wound was Trauma. The wound is located on the Right,Anterior Lower Leg. The wound measures 2.3cm length x 1.2cm width x 0.1cm depth; 2.168cm^2 area and 0.217cm^3 volume. The wound is limited to skin breakdown. There is no tunneling or undermining Angela Franco, Angela Franco ([id number]) noted. There is a small amount of drainage noted. The wound margin is  indistinct and nonvisible. There is large (67-100%) red granulation within the wound bed. There is a small (1-33%) amount of necrotic tissue within the wound bed including Eschar and Adherent Slough. The periwound skin appearance exhibited: Localized Edema, Moist. The periwound skin appearance did not exhibit: Callus, Crepitus, Excoriation, Fluctuance, Friable, Induration, Rash, Scarring, Dry/Scaly, Maceration, Atrophie Blanche, Cyanosis, Ecchymosis, Hemosiderin  Staining, Mottled, Pallor, Rubor, Erythema. Periwound temperature was noted as No Abnormality. Assessment Active Problems ICD-10 L97.212 - Non-pressure chronic ulcer of right calf with fat layer exposed I48.2 - Chronic atrial fibrillation N18.3 - Chronic kidney disease, stage 3 (moderate) R60.0 - Localized edema Right anterior calf traumatic ulceration. Procedures Wound #1 Wound #1 is a Trauma, Other located on the Right,Anterior Lower Leg . There was a Non-Viable Tissue Open Wound/Selective [id number]) debridement with total area of 2.76 sq cm performed by BURNS III, Teressa Senter., MD. with the following instrument(s): Forceps to remove Non-Viable tissue/material including Exudate, Fibrin/Slough, and Eschar after achieving pain control using Other (lidocaine 4%). A time out was conducted prior to the start of the procedure. There was no bleeding. The procedure was tolerated well with a pain level of 0 throughout and a pain level of 0 following the procedure. Post Debridement Measurements: 2.3cm length x 1.2cm width x 0.1cm depth; 0.217cm^3 volume. Post procedure Diagnosis Wound #1: Same as Pre-Procedure Plan Wound Cleansing: Angela Franco, Angela Franco ([id number]) Wound #1 Right,Anterior Lower Leg: Cleanse wound with mild soap and water May Shower, gently pat wound dry prior to applying new dressing. Anesthetic: Wound #1 Right,Anterior Lower Leg: Topical Lidocaine 4% cream applied to wound bed prior to debridement Primary Wound Dressing: Wound #1 Right,Anterior Lower Leg: Prisma Ag - or collagen with silver equivalent Secondary Dressing: Wound #1 Right,Anterior Lower Leg: Boardered Foam Dressing Dressing Change Frequency: Change dressing every other day. Follow-up Appointments: Wound #1 Right,Anterior Lower Leg: Return Appointment in 1 week. Edema Control: Wound #1 Right,Anterior Lower Leg: Tubigrip Promogran Prisma. Tubigrip for edema control. Electronic Signature(s) Signed:  10/06/2015 4:23:42 PM By: Loletha Grayer MD Entered By: Loletha Grayer on 10/06/2015 15:03:35 Angela Franco ([id number]) -------------------------------------------------------------------------------- SuperBill Details Patient Name: Angela Franco Date of Service: 10/06/2015 Medical Record Patient Account Number: [id number] Number: Treating RN: Montey Hora June 19, 1929 (81 y.o. Other Clinician: Date of Birth/Sex: Female) Treating BURNS III, Primary Care Physician/Extender: Betsey Holiday Physician: Weeks in Treatment: 2 Referring Physician: Reymundo Franco Diagnosis Coding ICD-10 Codes Code Description [id number] Non-pressure chronic ulcer of right calf with fat layer exposed I48.2 Chronic atrial fibrillation N18.3 Chronic kidney disease, stage 3 (moderate) R60.0 Localized edema Facility Procedures CPT4 Code: [id number] Description: [phone] - DEBRIDE WOUND 1ST 20 SQ CM OR < ICD-10 Description Diagnosis L97.212 Non-pressure chronic ulcer of right calf with fat l Modifier: ayer exposed Quantity: 1 Physician Procedures CPT4 Code: [id number] Description: 97597 - WC PHYS DEBR WO ANESTH 20 SQ CM ICD-10 Description Diagnosis L97.212 Non-pressure chronic ulcer of right calf with fat l Modifier: ayer exposed Quantity: 1 Electronic Signature(s) Signed: 10/06/2015 4:23:42 PM By: Loletha Grayer MD Entered By: Loletha Grayer on 10/06/2015 15:03:45

## 2015-10-13 ENCOUNTER — Ambulatory Visit (INDEPENDENT_AMBULATORY_CARE_PROVIDER_SITE_OTHER): Payer: Medicare Other | Admitting: Neurology

## 2015-10-13 ENCOUNTER — Encounter: Payer: Self-pay | Admitting: Neurology

## 2015-10-13 ENCOUNTER — Encounter: Payer: Medicare Other | Admitting: Surgery

## 2015-10-13 VITALS — BP 122/62 | HR 64 | Ht 63.0 in | Wt 139.0 lb

## 2015-10-13 DIAGNOSIS — L97212 Non-pressure chronic ulcer of right calf with fat layer exposed: Secondary | ICD-10-CM | POA: Diagnosis not present

## 2015-10-13 DIAGNOSIS — G500 Trigeminal neuralgia: Secondary | ICD-10-CM

## 2015-10-13 MED ORDER — OXCARBAZEPINE 150 MG PO TABS: ORAL_TABLET | ORAL | Status: DC

## 2015-10-13 NOTE — Progress Notes (Signed)
Chief Complaint  Patient presents with  . Trigeminal Neuralgia    She is here with her daughter, [name]. She has had an exacerbation of her right-sided facial pain.  She has been using oxycodone prn which has been helpful.  She is still taking gabapentin 600mg , TID.     [facility]  PATIENT: Angela Franco DOB: 1929-01-15  HISTORY OF PRESENT ILLNESS:[name], 81 year old female returns for followup. She has a history of facial pain and mild memory loss. Her memory score was 30/30 at last visit.  She had previously been on carbamazepine but due to  sodium levels in the low 120s her carbamazepine has been discontinued and she is on gabapentin for her facial pain with good control. She returns today with her daughter. She is currently in an assisted living. She exercises on a regular basis. She has no new complaints. She returns for reevaluation. She has attempted to taper the gabapentin with return of the facial pain. She was treated for pneumonia 2 months ago  HISTORY: She has history of facial pain and history of memory loss. She is doing well with her facial pain and her memory loss is stable. Her primary care physician Dr. Tamala Julian called to the office 08/01/2013 and spoke to Dr. Krista Blue about decreasing carbamazepine due to sodium levels in the low 120s. She continues to take one carbamazepine daily She is also on gabapentin. She is accompanied by her son today. She is currently at an assisted living. She has no new complaints    UPDATE June 14 2015: He is with her daughter at today's clinical visit, she is seeing nephrologist for worsening chronic renal insufficiency, she is taking Gabapentin  600mg  tid, there was no recurrent right facial pain,  She lives alone at independent living, still active, exercise regularly, no significant memory trouble,  She does not want changes of her gabapentin, despite worsening kidney function, because recurrent right facial pain the past  with lower dose of gabapentin,   UPDATE Oct 13 2015: She is with her daughter, she now complains of worsening right facial/cheek pain since end of Oct 2016, she is taking gabapentin 600mg  tid, oxycodone as needed, which does help her pain, the benefit of oxycodone lasted about 6 hours.  It does make her dizziness, confusion, previously carbamazepine was helpful, but caused hyponatremia,   REVIEW OF SYSTEMS: Full 14 system review of systems performed and notable only for those listed, all others are neg:  Leg swelling, insomnia, activity change, wound, bleed easily   ALLERGIES: Allergies  Allergen Reactions  . Penicillins Hives    HOME MEDICATIONS: Outpatient Prescriptions Prior to Visit  Medication Sig Dispense Refill  . apixaban (ELIQUIS) 2.5 MG TABS tablet Take 2.5 mg by mouth 2 (two) times daily.    . carvedilol (COREG) 6.25 MG tablet Take 1 tablet (6.25 mg total) by mouth 2 (two) times daily with a meal. 60 tablet 0  . docusate sodium 100 MG CAPS Take 200 mg by mouth at bedtime as needed. 60 capsule 0  . ferrous sulfate 325 (65 FE) MG tablet Take 325 mg by mouth daily with breakfast.    . gabapentin (NEURONTIN) 600 MG tablet Take 1 tablet (600 mg total) by mouth 3 (three) times daily. Please dose at 8am, 2pm and 8pm 270 tablet 3  . potassium chloride SA (K-DUR,KLOR-CON) 20 MEQ tablet Take 1 tablet (20 mEq total) by mouth 2 (two) times daily. 60 tablet 0  . simvastatin (ZOCOR) 10 MG tablet Take  5 mg by mouth daily at 6 PM.     . temazepam (RESTORIL) 15 MG capsule Take 15 mg by mouth at bedtime.     [name] tiotropium (SPIRIVA) 18 MCG inhalation capsule Place 18 mcg into inhaler and inhale daily.    . furosemide (LASIX) 80 MG tablet Take 80 mg by mouth every Monday, Wednesday, and Friday.      No facility-administered medications prior to visit.    PAST MEDICAL HISTORY: Past Medical History  Diagnosis Date  . COPD (chronic obstructive pulmonary disease) ([place])     Never smoked.  Questionable diagnosis.  . Atrial fibrillation ([place])   . Osteopenia   . Insomnia   . Hyponatremia   . Hypertension   . UTI (lower urinary tract infection)   . Bursitis of hip   . Trigeminal neuralgia   . Altered mental status 08/24/2012  . Anemia, with significant drop in H/H 08/24/2012  . CHF (congestive heart failure) ([place])   . Heart disease   . Acute facial pain   . LBBB (left bundle branch block)   . Nonischemic cardiomyopathy ([place])   . Dyslipidemia   . Kidney failure     Stage 3    PAST SURGICAL HISTORY: Past Surgical History  Procedure Laterality Date  . Appendectomy    . Tonsillectomy and adenoidectomy    . Cardiac catheterization  07/04/2012    normal coronaries, EF 30-35%, elevated R & L heart pressures, reduced CO, mild pulm HTN (Dr. K. Mali Hilty)  . Transthoracic echocardiogram  2013    mild conc LVH, RV mildly dilated; LA severely dilated; RA mod dilated; mild-mod mitral annular calcif, calcif of anterior & posterior MV leaflet, mod MR; mod-severe TR with RSVP 30-18mmHg; mild calcif of AV leaflets and mod aortic regurg; mild pulm valve regurg; pericardial effusion (right)  . Nm myocar perf wall motion  2011    lexiscan myoview - normal LV systolic function w/normal wall motion, no evidence of scar/ischemia  . Left and right heart catheterization with coronary angiogram N/A 07/04/2012    Procedure: LEFT AND RIGHT HEART CATHETERIZATION WITH CORONARY ANGIOGRAM;  Surgeon: Pixie Casino, MD;  Location: [facility] CATH LAB;  Service: Cardiovascular;  Laterality: N/A;    FAMILY HISTORY: Family History  Problem Relation Age of Onset  . Stroke Father   . Heart attack Father   . Heart attack Brother   . Heart attack Brother   . Alzheimer's disease Mother     SOCIAL HISTORY: Social History   Social History  . Marital Status: Widowed    Spouse Name: N/A  . Number of Children: 3  . Years of Education: 12   Occupational History  . Not on file.   Social History Main  Topics  . Smoking status: Never Smoker   . Smokeless tobacco: Never Used  . Alcohol Use: No  . Drug Use: No  . Sexual Activity: Not on file   Other Topics Concern  . Not on file   Social History Narrative   Patient is widowed.    Patient has 3 children.    Patient is retired.      PHYSICAL EXAM  Filed Vitals:   10/13/15 1411  BP: 122/62  Pulse: 64  Height: 5\' 3"  (1.6 m)  Weight: 139 lb (63.05 kg)   Body mass index is 24.63 kg/(m^2).   PHYSICAL EXAMNIATION:  Gen: NAD, conversant, well nourised, obese, well groomed  Cardiovascular: Regular rate rhythm, no peripheral edema, warm, nontender. Eyes: Conjunctivae clear without exudates or hemorrhage Neck: Supple, no carotid bruise. Pulmonary: Clear to auscultation bilaterally   NEUROLOGICAL EXAM:  MENTAL STATUS: Speech:    Speech is normal; fluent and spontaneous with normal comprehension.  Cognition:     Orientation to time, place and person     Normal recent and remote memory     Normal Attention span and concentration     Normal Language, naming, repeating,spontaneous speech     Fund of knowledge   CRANIAL NERVES: CN II: Visual fields are full to confrontation. Pupils are round equal and briskly reactive to light. CN III, IV, VI: extraocular movement are normal. No ptosis. CN V: Facial sensation is intact to pinprick in all 3 divisions bilaterally. Corneal responses are intact.  CN VII: Face is symmetric with normal eye closure and smile. CN VIII: Hearing is normal to rubbing fingers CN IX, X: Palate elevates symmetrically. Phonation is normal. CN XI: Head turning and shoulder shrug are intact CN XII: Tongue is midline with normal movements and no atrophy.  MOTOR: There is no pronator drift of out-stretched arms. Muscle bulk and tone are normal. Muscle strength is normal.  REFLEXES: Reflexes are 2+ and symmetric at the biceps, triceps, knees, and ankles. Plantar responses are  flexor.  SENSORY: Intact to light touch, pinprick, position sense, and vibration sense are intact in fingers and toes.  COORDINATION: Rapid alternating movements and fine finger movements are intact. There is no dysmetria on finger-to-nose and heel-knee-shin.    GAIT/STANCE: Mildly unsteady   DIAGNOSTIC DATA (LABS, IMAGING, TESTING) - ASSESSMENT AND PLAN  81 y.o. year old female   Trigeminal neuralgia   Worsening right facial pain, involving right V2, V3 branches.  Continue gabapentin 600 mg 3 times daily  Oxycodone prn  Add on trileptal 150mg  bid  RTC in 3 months   Marcial Pacas, M.D. Ph.D.  [facility] [place], [place] [address] Phone: [phone] Fax:      [phone]

## 2015-10-15 NOTE — Progress Notes (Signed)
Angela, Franco ([id number]) Visit Report for 10/13/2015 Arrival Information Details Patient Name: Angela Franco, Angela Franco Date of Service: 10/13/2015 10:00 AM Medical Record Patient Account Number: [id number] Number: Treating RN: Montey Hora 16-Aug-1929 (81 y.o. Other Clinician: Date of Birth/Sex: Female) Treating BURNS III, Primary Care Physician: Reymundo Poll Physician/Extender: Thayer Jew Referring Physician: Joycelyn Das in Treatment: 3 Visit Information History Since Last Visit Added or deleted any medications: No Patient Arrived: Ambulatory Any new allergies or adverse reactions: No Arrival Time: 10:11 Had a fall or experienced change in No Accompanied By: self activities of daily living that may affect Transfer Assistance: None risk of falls: Patient Identification Verified: Yes Signs or symptoms of abuse/neglect since last No Secondary Verification Process Yes visito Completed: Hospitalized since last visit: No Patient Requires Transmission- No Pain Present Now: No Based Precautions: Patient Has Alerts: Yes Patient Alerts: Patient on Blood Thinner ELiquis, ABI;L:1.13 R:1.06 Electronic Signature(s) Signed: 10/14/2015 5:07:12 PM By: Montey Hora Entered By: Montey Hora on 10/13/2015 10:14:07 Angela Franco ([id number]) -------------------------------------------------------------------------------- Encounter Discharge Information Details Patient Name: Angela Franco Date of Service: 10/13/2015 10:00 AM Medical Record Patient Account Number: [id number] Number: Treating RN: Montey Hora 09-18-1929 (81 y.o. Other Clinician: Date of Birth/Sex: Female) Treating BURNS III, Primary Care Physician: Reymundo Poll Physician/Extender: Thayer Jew Referring Physician: Joycelyn Das in Treatment: 3 Encounter Discharge Information Items Discharge Pain Level: 0 Discharge Condition: Stable Ambulatory Status: Ambulatory Discharge Destination:  Home Transportation: Other Accompanied By: self Schedule Follow-up Appointment: Yes Medication Reconciliation completed and provided to Patient/Care No Baneza Bartoszek: Provided on Clinical Summary of Care: 10/13/2015 Form Type Recipient Paper Patient DP Electronic Signature(s) Signed: 10/13/2015 10:35:09 AM By: Ruthine Dose Entered By: Ruthine Dose on 10/13/2015 10:35:09 Angela Franco ([id number]) -------------------------------------------------------------------------------- Lower Extremity Assessment Details Patient Name: Angela Franco Date of Service: 10/13/2015 10:00 AM Medical Record Patient Account Number: [id number] Number: Treating RN: Montey Hora [birth year] (81 y.o. Other Clinician: Date of Birth/Sex: Female) Treating BURNS III, Primary Care Physician: Reymundo Poll Physician/Extender: Thayer Jew Referring Physician: Joycelyn Das in Treatment: 3 Edema Assessment Assessed: [Left: No] [Right: No] Edema: [Left: Ye] [Right: s] Calf Left: Right: Point of Measurement: 35 cm From Medial Instep cm 36 cm Ankle Left: Right: Point of Measurement: 10 cm From Medial Instep cm 23.6 cm Vascular Assessment Pulses: Posterior Tibial Dorsalis Pedis Palpable: [Right:Yes] Extremity colors, hair growth, and conditions: Extremity Color: [Right:Hyperpigmented] Hair Growth on Extremity: [Right:No] Temperature of Extremity: [Right:Warm] Capillary Refill: [Right:< 3 seconds] Electronic Signature(s) Signed: 10/14/2015 5:07:12 PM By: Montey Hora Entered By: Montey Hora on 10/13/2015 10:16:14 Angela Franco ([id number]) -------------------------------------------------------------------------------- Multi Wound Chart Details Patient Name: Angela Franco Date of Service: 10/13/2015 10:00 AM Medical Record Patient Account Number: [id number] Number: Treating RN: Montey Hora 1929-07-29 (81 y.o. Other Clinician: Date of Birth/Sex: Female) Treating  BURNS III, Primary Care Physician: Reymundo Poll Physician/Extender: Thayer Jew Referring Physician: Joycelyn Das in Treatment: 3 Vital Signs Height(in): 65 Pulse(bpm): 58 Weight(lbs): 147 Blood Pressure 125/50 (mmHg): Body Mass Index(BMI): 24 Temperature(F): 98.2 Respiratory Rate 18 (breaths/min): Photos: [1:No Photos] [N/A:N/A] Wound Location: [1:Right, Anterior Lower Leg] [N/A:N/A] Wounding Event: [1:Trauma] [N/A:N/A] Primary Etiology: [1:Trauma, Other] [N/A:N/A] Date Acquired: [1:08/29/2015] [N/A:N/A] Weeks of Treatment: [1:3] [N/A:N/A] Wound Status: [1:Open] [N/A:N/A] Measurements L x W x D 2.3x1.2x0.1 [N/A:N/A] (cm) Area (cm) : [1:2.168] [N/A:N/A] Volume (cm) : [1:0.217] [N/A:N/A] % Reduction in Area: [1:61.70%] [N/A:N/A] % Reduction in Volume: 61.60% [N/A:N/A] Classification: [1:Partial Thickness] [N/A:N/A] Debridement: [1:Debridement [id number] [N/A:N/A] Time-Out Taken: [1:Yes] [N/A:N/A] Pain Control: [1:Lidocaine 4% Topical Solution] [  N/A:N/A] Tissue Debrided: [1:Fibrin/Slough, Subcutaneous] [N/A:N/A] Level: [1:Skin/Subcutaneous Tissue] [N/A:N/A] Instrument: [1:Curette] [N/A:N/A] Bleeding: [1:Minimum] [N/A:N/A] Hemostasis Achieved: Pressure [N/A:N/A] Procedural Pain: [1:0] [N/A:N/A] Post Procedural Pain: 0 [N/A:N/A] Debridement Treatment Procedure was tolerated N/A N/A Response: well Periwound Skin Texture: No Abnormalities Noted N/A N/A Periwound Skin No Abnormalities Noted N/A N/A Moisture: Periwound Skin Color: No Abnormalities Noted N/A N/A Tenderness on No N/A N/A Palpation: Procedures Performed: Debridement N/A N/A Treatment Notes Electronic Signature(s) Signed: 10/14/2015 5:07:12 PM By: Montey Hora Entered By: Montey Hora on 10/13/2015 10:26:23 Angela Franco ([id number]) -------------------------------------------------------------------------------- Multi-Disciplinary Care Plan Details Patient Name: Angela Franco Date of  Service: 10/13/2015 10:00 AM Medical Record Patient Account Number: [id number] Number: Treating RN: Montey Hora 02/09/1929 (81 y.o. Other Clinician: Date of Birth/Sex: Female) Treating BURNS III, Primary Care Physician: Reymundo Poll Physician/Extender: Thayer Jew Referring Physician: Joycelyn Das in Treatment: 3 Active Inactive Abuse / Safety / Falls / Self Care Management Nursing Diagnoses: Potential for falls Self care deficit: actual or potential Goals: Patient will remain injury free Date Initiated: 09/22/2015 Goal Status: Active Patient/caregiver will verbalize/demonstrate measures taken to improve the patient's personal safety Date Initiated: 09/22/2015 Goal Status: Active Patient/caregiver will verbalize/demonstrate measures taken to prevent injury and/or falls Date Initiated: 09/22/2015 Goal Status: Active Interventions: Assess fall risk on admission and as needed Assess: immobility, friction, shearing, incontinence upon admission and as needed Assess self care needs on admission and as needed Provide education on basic hygiene Provide education on fall prevention Provide education on personal and home safety Provide education on safe transfers Notes: Orientation to the Wound Care Program Nursing Diagnoses: Knowledge deficit related to the wound healing center program Goals: MAHRI, ECKHOLM ([id number]) Patient/caregiver will verbalize understanding of the [facility] Program Date Initiated: 09/22/2015 Goal Status: Active Interventions: Provide education on orientation to the wound center Notes: Wound/Skin Impairment Nursing Diagnoses: Impaired tissue integrity Knowledge deficit related to ulceration/compromised skin integrity Goals: Ulcer/skin breakdown will have a volume reduction of 50% by week 8 Date Initiated: 09/22/2015 Goal Status: Active Ulcer/skin breakdown will have a volume reduction of 80% by week 12 Date Initiated:  09/22/2015 Goal Status: Active Ulcer/skin breakdown will heal within 14 weeks Date Initiated: 09/22/2015 Goal Status: Active Interventions: Assess patient/caregiver ability to obtain necessary supplies Assess patient/caregiver ability to perform ulcer/skin care regimen upon admission and as needed Assess ulceration(s) every visit Provide education on ulcer and skin care Treatment Activities: Referred to DME Davis Vannatter for dressing supplies : 10/13/2015 Skin care regimen initiated : 10/13/2015 Notes: Electronic Signature(s) Signed: 10/14/2015 5:07:12 PM By: Montey Hora Entered By: Montey Hora on 10/13/2015 10:26:16 Angela Franco ([id number]) -------------------------------------------------------------------------------- Patient/Caregiver Education Details Patient Name: Angela Franco Date of Service: 10/13/2015 10:00 AM Medical Record Patient Account Number: [id number] Number: Treating RN: Montey Hora [birth year] (81 y.o. Other Clinician: Date of Birth/Gender: Female) Treating BURNS III, Primary Care Physician: Reymundo Poll Physician/Extender: Thayer Jew Referring Physician: Joycelyn Das in Treatment: 3 Education Assessment Education Provided To: Patient Education Topics Provided Wound/Skin Impairment: Handouts: Other: wound care as ordered Methods: Demonstration, Explain/Verbal Responses: State content correctly Electronic Signature(s) Signed: 10/14/2015 5:07:12 PM By: Montey Hora Entered By: Montey Hora on 10/13/2015 10:35:12 Angela Franco ([id number]) -------------------------------------------------------------------------------- Wound Assessment Details Patient Name: Angela Franco Date of Service: 10/13/2015 10:00 AM Medical Record Patient Account Number: [id number] Number: Treating RN: Montey Hora 09-17-1929 (81 y.o. Other Clinician: Date of Birth/Sex: Female) Treating BURNS III, Primary Care Physician: Reymundo Poll Physician/Extender: Thayer Jew Referring Physician: Joycelyn Das in Treatment: 3 Wound Status  Wound Number: 1 Primary Trauma, Other Etiology: Wound Location: Right Lower Leg - Anterior Wound Open Wounding Event: Trauma Status: Date Acquired: 08/29/2015 Comorbid Cataracts, Anemia, Arrhythmia, Weeks Of Treatment: 3 History: Hypertension, Osteoarthritis Clustered Wound: No Photos Photo Uploaded By: Montey Hora on 10/13/2015 11:52:26 Wound Measurements Length: (cm) 1.1 Width: (cm) 0.9 Depth: (cm) 0.1 Area: (cm) 0.778 Volume: (cm) 0.078 % Reduction in Area: 86.2% % Reduction in Volume: 86.2% Epithelialization: Medium (34-66%) Tunneling: No Undermining: No Wound Description Classification: Partial Thickness Foul Odor After Wound Margin: Indistinct, nonvisible Exudate Amount: Small Cleansing: No Wound Bed Granulation Amount: Large (67-100%) Exposed Structure Granulation Quality: Red Fascia Exposed: No Necrotic Amount: None Present (0%) Fat Layer Exposed: No Tendon Exposed: No Muscle Exposed: No Holan, Shamere ([id number]) Joint Exposed: No Bone Exposed: No Limited to Skin Breakdown Periwound Skin Texture Texture Color No Abnormalities Noted: No No Abnormalities Noted: No Callus: No Atrophie Blanche: No Crepitus: No Cyanosis: No Excoriation: No Ecchymosis: No Fluctuance: No Erythema: No Friable: No Hemosiderin Staining: No Induration: No Mottled: No Localized Edema: Yes Pallor: No Rash: No Rubor: No Scarring: No Temperature / Pain Moisture Temperature: No Abnormality No Abnormalities Noted: No Dry / Scaly: No Maceration: No Moist: Yes Wound Preparation Ulcer Cleansing: Rinsed/Irrigated with Saline Topical Anesthetic Applied: Other: Lidocaine 4%, Treatment Notes Wound #1 (Right, Anterior Lower Leg) 1. Cleansed with: Clean wound with Normal Saline 2. Anesthetic Topical Lidocaine 4% cream to wound bed prior to debridement 3.  Peri-wound Care: Skin Prep 4. Dressing Applied: Other dressing (specify in notes) 5. Secondary Dressing Applied Bordered Foam Dressing Dry Gauze Notes mupirocin Electronic Signature(s) Signed: 10/14/2015 5:07:12 PM By: Montey Hora Entered By: Montey Hora on 10/13/2015 10:28:19 BELIEVE, ROBBIN ([id number]) LANAUTICA, WINCHELL ([id number]) -------------------------------------------------------------------------------- Vitals Details Patient Name: Angela Franco Date of Service: 10/13/2015 10:00 AM Medical Record Patient Account Number: [id number] Number: Treating RN: Montey Hora 1929-06-20 (81 y.o. Other Clinician: Date of Birth/Sex: Female) Treating BURNS III, Primary Care Physician: Reymundo Poll Physician/Extender: Thayer Jew Referring Physician: Joycelyn Das in Treatment: 3 Vital Signs Time Taken: 10:14 Temperature (F): 98.2 Height (in): 65 Pulse (bpm): 58 Weight (lbs): 147 Respiratory Rate (breaths/min): 18 Body Mass Index (BMI): 24.5 Blood Pressure (mmHg): 125/50 Reference Range: 80 - 120 mg / dl Electronic Signature(s) Signed: 10/14/2015 5:07:12 PM By: Montey Hora Entered By: Montey Hora on 10/13/2015 10:14:32

## 2015-10-15 NOTE — Progress Notes (Signed)
Angela Franco, Angela Franco ([id number]) Visit Report for 10/13/2015 Chief Complaint Document Details Patient Name: SHIYA, KESSELRING Date of Service: 10/13/2015 10:00 AM Medical Record Patient Account Number: [id number] Number: Treating RN: Montey Hora 1929-11-15 (81 y.o. Other Clinician: Date of Birth/Sex: Female) Treating BURNS III, Primary Care Physician/Extender: Betsey Holiday Physician: Referring Physician: Joycelyn Das in Treatment: 3 Information Obtained from: Patient Chief Complaint Right calf traumatic ulceration. Electronic Signature(s) Signed: 10/13/2015 4:26:05 PM By: Loletha Grayer MD Entered By: Loletha Grayer on 10/13/2015 11:56:13 Angela Franco ([id number]) -------------------------------------------------------------------------------- Debridement Details Patient Name: Angela Franco Date of Service: 10/13/2015 10:00 AM Medical Record Patient Account Number: [id number] Number: Treating RN: Montey Hora February 20, 1929 (81 y.o. Other Clinician: Date of Birth/Sex: Female) Treating BURNS III, Primary Care Physician/Extender: Glena Norfolk, Mallie Mussel Physician: Referring Physician: Joycelyn Das in Treatment: 3 Debridement Performed for Wound #1 Right,Anterior Lower Leg Assessment: Performed By: Physician BURNS III, Teressa Senter., MD Debridement: Debridement Pre-procedure Yes Verification/Time Out Taken: Start Time: 10:23 Pain Control: Lidocaine 4% Topical Solution Level: Skin/Subcutaneous Tissue Total Area Debrided (L x 1.1 (cm) x 0.9 (cm) = 0.99 (cm) W): Tissue and other Non-Viable, Fat, Fibrin/Slough, Subcutaneous material debrided: Instrument: Curette Bleeding: Minimum Hemostasis Achieved: Pressure End Time: 10:26 Procedural Pain: 0 Post Procedural Pain: 0 Response to Treatment: Procedure was tolerated well Post Debridement Measurements of Total Wound Length: (cm) 1.1 Width: (cm) 0.9 Depth: (cm) 0.2 Volume: (cm)  0.156 Post Procedure Diagnosis Same as Pre-procedure Electronic Signature(s) Signed: 10/13/2015 4:26:05 PM By: Loletha Grayer MD Signed: 10/14/2015 5:07:12 PM By: Montey Hora Entered By: Loletha Grayer on 10/13/2015 11:56:03 Angela Franco, Angela Franco ([id number]) Angela Franco, Angela Franco ([id number]) -------------------------------------------------------------------------------- HPI Details Patient Name: Angela Franco Date of Service: 10/13/2015 10:00 AM Medical Record Patient Account Number: [id number] Number: Treating RN: Montey Hora 1929-11-03 (81 y.o. Other Clinician: Date of Birth/Sex: Female) Treating BURNS III, Primary Care Physician/Extender: Betsey Holiday Physician: Referring Physician: Joycelyn Das in Treatment: 3 History of Present Illness HPI Description: Very pleasant 81 year old with history of atrial fibrillation (on Eliquis) and chronic kidney disease. No history of diabetes or PAD. Right ABI 1.06. She fell and traumatized her right anterior calf in September 2016. She describes significant swelling and bruising. She reportedly developed cellulitis, and completed a course of levofloxacin. Ambulating per her baseline. No significant pain. No ischemic rest pain or claudication. Performing dressing changes with Prisma and using a Tubigrip for edema control. No new complaints today. No fever or chills. Minimal serosanguineous drainage. Electronic Signature(s) Signed: 10/13/2015 4:26:05 PM By: Loletha Grayer MD Entered By: Loletha Grayer on 10/13/2015 11:57:04 Angela Franco ([id number]) -------------------------------------------------------------------------------- Physical Exam Details Patient Name: Angela Franco Date of Service: 10/13/2015 10:00 AM Medical Record Patient Account Number: [id number] Number: Treating RN: Montey Hora 05-27-1929 (81 y.o. Other Clinician: Date of Birth/Sex: Female) Treating BURNS III, Primary  Care Physician/Extender: Betsey Holiday Physician: Referring Physician: Joycelyn Das in Treatment: 3 Constitutional . Pulse regular. Respirations normal and unlabored. Afebrile. . Notes Right anterior calf ulceration improved. Full-thickness. Healthy granulating base. Minimal eschar removed. One plus pitting edema. No significant cellulitis. Palpable DP. Right ABI 1.06. Electronic Signature(s) Signed: 10/13/2015 4:26:05 PM By: Loletha Grayer MD Entered By: Loletha Grayer on 10/13/2015 11:57:37 Angela Franco ([id number]) -------------------------------------------------------------------------------- Physician Orders Details Patient Name: Angela Franco Date of Service: 10/13/2015 10:00 AM Medical Record Patient Account Number: [id number] Number: Treating RN: Montey Hora 06-23-1929 (81 y.o. Other Clinician: Date of Birth/Sex: Female) Treating BURNS III, Primary  Care Physician/Extender: Betsey Holiday Physician: Referring Physician: Joycelyn Das in Treatment: 3 Verbal / Phone Orders: Yes Clinician: Montey Hora Read Back and Verified: Yes Diagnosis Coding Wound Cleansing Wound #1 Right,Anterior Lower Leg o Cleanse wound with mild soap and water o May Shower, gently pat wound dry prior to applying new dressing. Anesthetic Wound #1 Right,Anterior Lower Leg o Topical Lidocaine 4% cream applied to wound bed prior to debridement Primary Wound Dressing Wound #1 Right,Anterior Lower Leg o Other: - mupirocin Secondary Dressing Wound #1 Right,Anterior Lower Leg o Dry Gauze o Boardered Foam Dressing Dressing Change Frequency o Change Dressing Monday, Wednesday, Friday Follow-up Appointments Wound #1 Right,Anterior Lower Leg o Return Appointment in 1 week. Edema Control Wound #1 Right,Anterior Lower Leg o Tubigrip Electronic Signature(s) Signed: 10/13/2015 4:26:05 PM By: Loletha Grayer MD Signed: 10/14/2015  5:07:12 PM By: Alberteen Sam, Natalya ([id number]) Entered By: Montey Hora on 10/13/2015 10:27:17 Angela Franco, Angela Franco ([id number]) -------------------------------------------------------------------------------- Problem List Details Patient Name: Angela Franco Date of Service: 10/13/2015 10:00 AM Medical Record Patient Account Number: [id number] Number: Treating RN: Montey Hora 1929/08/19 (81 y.o. 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 I48.2 Chronic atrial fibrillation 09/22/2015 Yes N18.3 Chronic kidney disease, stage 3 (moderate) 09/22/2015 Yes R60.0 Localized edema 09/22/2015 Yes Inactive Problems Resolved Problems Electronic Signature(s) Signed: 10/13/2015 4:26:05 PM By: Loletha Grayer MD Entered By: Loletha Grayer on 10/13/2015 11:55:39 Angela Franco ([id number]) -------------------------------------------------------------------------------- Progress Note Details Patient Name: Angela Franco Date of Service: 10/13/2015 10:00 AM Medical Record Patient Account Number: [id number] Number: Treating RN: Montey Hora [birth year] (81 y.o. Other Clinician: Date of Birth/Sex: Female) Treating BURNS III, Primary Care Physician/Extender: Betsey Holiday Physician: Referring Physician: Joycelyn Das in Treatment: 3 Subjective Chief Complaint Information obtained from Patient Right calf traumatic ulceration. History of Present Illness (HPI) Very pleasant 81 year old with history of atrial fibrillation (on Eliquis) and chronic kidney disease. No history of diabetes or PAD. Right ABI 1.06. She fell and traumatized her right  anterior calf in September 2016. She describes significant swelling and bruising. She reportedly developed cellulitis, and completed a course of levofloxacin. Ambulating per her baseline. No significant pain. No ischemic rest pain or claudication. Performing dressing changes with Prisma and using a Tubigrip for edema control. No new complaints today. No fever or chills. Minimal serosanguineous drainage. Objective Constitutional Pulse regular. Respirations normal and unlabored. Afebrile. Vitals Time Taken: 10:14 AM, Height: 65 in, Weight: 147 lbs, BMI: 24.5, Temperature: 98.2 F, Pulse: 58 bpm, Respiratory Rate: 18 breaths/min, Blood Pressure: 125/50 mmHg. General Notes: Right anterior calf ulceration improved. Full-thickness. Healthy granulating base. Minimal eschar removed. One plus pitting edema. No significant cellulitis. Palpable DP. Right ABI 1.06. Integumentary (Hair, Skin) Wound #1 status is Open. Original cause of wound was Trauma. The wound is located on the Right,Anterior Lower Leg. The wound measures 1.1cm length x 0.9cm width x 0.1cm depth; 0.778cm^2 area and 0.078cm^3 volume. The wound is limited to skin breakdown. There is no tunneling or undermining Angela Franco, Angela Franco ([id number]) noted. There is a small amount of drainage noted. The wound margin is indistinct and nonvisible. There is large (67-100%) red granulation within the wound bed. There is no necrotic tissue within the wound bed. The periwound skin appearance exhibited: Localized Edema, Moist.  The periwound skin appearance did not exhibit: Callus, Crepitus, Excoriation, Fluctuance, Friable, Induration, Rash, Scarring, Dry/Scaly, Maceration, Atrophie Blanche, Cyanosis, Ecchymosis, Hemosiderin Staining, Mottled, Pallor, Rubor, Erythema. Periwound temperature was noted as No Abnormality. Assessment Active Problems ICD-10 L97.212 - Non-pressure chronic ulcer of right calf with fat layer exposed I48.2 - Chronic atrial  fibrillation N18.3 - Chronic kidney disease, stage 3 (moderate) R60.0 - Localized edema Right anterior calf traumatic ulceration. Procedures Wound #1 Wound #1 is a Trauma, Other located on the Right,Anterior Lower Leg . There was a Skin/Subcutaneous Tissue Debridement [id number]) debridement with total area of 0.99 sq cm performed by BURNS III, Teressa Senter., MD. with the following instrument(s): Curette to remove Non-Viable tissue/material including Fat, Fibrin/Slough, and Subcutaneous after achieving pain control using Lidocaine 4% Topical Solution. A time out was conducted prior to the start of the procedure. A Minimum amount of bleeding was controlled with Pressure. The procedure was tolerated well with a pain level of 0 throughout and a pain level of 0 following the procedure. Post Debridement Measurements: 1.1cm length x 0.9cm width x 0.2cm depth; 0.156cm^3 volume. Post procedure Diagnosis Wound #1: Same as Pre-Procedure Plan Wound Cleansing: Angela Franco, Angela Franco ([id number]) Wound #1 Right,Anterior Lower Leg: Cleanse wound with mild soap and water May Shower, gently pat wound dry prior to applying new dressing. Anesthetic: Wound #1 Right,Anterior Lower Leg: Topical Lidocaine 4% cream applied to wound bed prior to debridement Primary Wound Dressing: Wound #1 Right,Anterior Lower Leg: Other: - mupirocin Secondary Dressing: Wound #1 Right,Anterior Lower Leg: Dry Gauze Boardered Foam Dressing Dressing Change Frequency: Change Dressing Monday, Wednesday, Friday Follow-up Appointments: Wound #1 Right,Anterior Lower Leg: Return Appointment in 1 week. Edema Control: Wound #1 Right,Anterior Lower Leg: Tubigrip Switch to Neosporin or mupirocin cream (Prisma sticking to wound). Continue Tubigrip for edema control. Electronic Signature(s) Signed: 10/13/2015 4:26:05 PM By: Loletha Grayer MD Entered By: Loletha Grayer on 10/13/2015 11:58:18 Angela Franco  ([id number]) -------------------------------------------------------------------------------- SuperBill Details Patient Name: Angela Franco Date of Service: 10/13/2015 Medical Record Patient Account Number: [id number] Number: Treating RN: Montey Hora 1929-09-27 (81 y.o. Other Clinician: Date of Birth/Sex: Female) Treating BURNS III, Primary Care Physician/Extender: Betsey Holiday Physician: Weeks in Treatment: 3 Referring Physician: Reymundo Poll Diagnosis Coding ICD-10 Codes Code Description [phone] Non-pressure chronic ulcer of right calf with fat layer exposed I48.2 Chronic atrial fibrillation N18.3 Chronic kidney disease, stage 3 (moderate) R60.0 Localized edema Facility Procedures CPT4 Code: [id number] Description: [id number] - DEB SUBQ TISSUE 20 SQ CM/< ICD-10 Description Diagnosis L97.212 Non-pressure chronic ulcer of right calf with fat Modifier: layer exposed Quantity: 1 Physician Procedures CPT4 Code: [id number] Description: 11042 - WC PHYS SUBQ TISS 20 SQ CM ICD-10 Description Diagnosis L97.212 Non-pressure chronic ulcer of right calf with fat Modifier: layer exposed Quantity: 1 Electronic Signature(s) Signed: 10/13/2015 4:26:05 PM By: Loletha Grayer MD Entered By: Loletha Grayer on 10/13/2015 11:58:32

## 2015-10-20 ENCOUNTER — Telehealth: Payer: Self-pay | Admitting: Neurology

## 2015-10-20 ENCOUNTER — Encounter: Payer: Medicare Other | Admitting: Surgery

## 2015-10-20 DIAGNOSIS — L97212 Non-pressure chronic ulcer of right calf with fat layer exposed: Secondary | ICD-10-CM | POA: Diagnosis not present

## 2015-10-20 DIAGNOSIS — E871 Hypo-osmolality and hyponatremia: Secondary | ICD-10-CM

## 2015-10-20 NOTE — Telephone Encounter (Signed)
Daughter Jackelyn Poling called to see if medication OXcarbazepine (TRILEPTAL) 150 MG tablet  can be upped or changed, still having trigeminal pain. States already at the maximum Dr. Krista Blue said they could go to.

## 2015-10-20 NOTE — Telephone Encounter (Signed)
Left message to her daughter's cell phone.

## 2015-10-20 NOTE — Telephone Encounter (Signed)
I have talked with her daughter, she is taking gabapentin 600mg  tid, trileptal 150mg  bid oxycodone as needed.  I have suggested her to take  Trileptal 150mg  tid Oxycodone 1/2 to one tab prn before meal. Gabapentin 600mg  tid.  Call back for worsening symptoms.

## 2015-10-20 NOTE — Telephone Encounter (Signed)
Spoke to Angela Franco - her mother is still having significant pain - now radiating to her ear as well.  She is also having difficulty eating due to her increased pain.  She is currently taking gabapentin 600mg , TID, Trieptal 150mg , BID (she has been on this dose x 3 days) and oxycodone-apap prn (prefers not to take).  They are asking if any further adjustments can be made to her medication regimen to help improve her symptoms.

## 2015-10-21 NOTE — Progress Notes (Signed)
JANELIS, GUCK ([id number]) Visit Report for 10/20/2015 Chief Complaint Document Details Patient Name: Angela Franco, Angela Franco Date of Service: 10/20/2015 2:00 PM Medical Record Patient Account Number: [id number] Number: Treating RN: Montey Hora 1928-12-08 (81 y.o. Other Clinician: Date of Birth/Sex: Female) Treating BURNS III, Primary Care Physician/Extender: Betsey Holiday Physician: Referring Physician: Joycelyn Das in Treatment: 4 Information Obtained from: Patient Chief Complaint Right calf traumatic ulceration. Electronic Signature(s) Signed: 10/20/2015 4:30:30 PM By: Loletha Grayer MD Entered By: Loletha Grayer on 10/20/2015 15:23:58 Angela Franco ([id number]) -------------------------------------------------------------------------------- HPI Details Patient Name: Angela Franco Date of Service: 10/20/2015 2:00 PM Medical Record Patient Account Number: [id number] Number: Treating RN: Montey Hora 1929-05-01 (81 y.o. Other Clinician: Date of Birth/Sex: Female) Treating BURNS III, Primary Care Physician/Extender: Betsey Holiday Physician: Referring Physician: Joycelyn Das in Treatment: 4 History of Present Illness HPI Description: Very pleasant 81 year old with history of atrial fibrillation (on Eliquis) and chronic kidney disease. No history of diabetes or PAD. Right ABI 1.06. She fell and traumatized her right anterior calf in September 2016. She describes significant swelling and bruising. She reportedly developed cellulitis, and completed a course of levofloxacin. Ambulating per her baseline. No significant pain. No ischemic rest pain or claudication. Performing dressing changes with polysporin and using a Tubigrip for edema control. No new complaints today. No fever or chills. Minimal serosanguineous drainage. Electronic Signature(s) Signed: 10/20/2015 4:30:30 PM By: Loletha Grayer MD Entered By: Loletha Grayer on 10/20/2015 15:24:15 Angela Franco ([id number]) -------------------------------------------------------------------------------- Physical Exam Details Patient Name: Angela Franco Date of Service: 10/20/2015 2:00 PM Medical Record Patient Account Number: [id number] Number: Treating RN: Montey Hora 02/20/1929 (81 y.o. Other Clinician: Date of Birth/Sex: Female) Treating BURNS III, Primary Care Physician/Extender: Betsey Holiday Physician: Referring Physician: Joycelyn Das in Treatment: 4 Constitutional . Pulse regular. Respirations normal and unlabored. Afebrile. . Notes Right anterior calf ulceration improved. Full-thickness. Healthy granulating base. Minimal biofilm removed. One plus pitting edema. No significant cellulitis. Palpable DP. Right ABI 1.06. Electronic Signature(s) Signed: 10/20/2015 4:30:30 PM By: Loletha Grayer MD Entered By: Loletha Grayer on 10/20/2015 15:24:45 Angela Franco ([id number]) -------------------------------------------------------------------------------- Physician Orders Details Patient Name: Angela Franco Date of Service: 10/20/2015 2:00 PM Medical Record Patient Account Number: [id number] Number: Treating RN: Montey Hora 1929/10/13 (81 y.o. Other Clinician: Date of Birth/Sex: Female) Treating BURNS III, Primary Care Physician/Extender: Betsey Holiday Physician: Referring Physician: Joycelyn Das in Treatment: 4 Verbal / Phone Orders: Yes Clinician: Montey Hora Read Back and Verified: Yes Diagnosis Coding Wound Cleansing Wound #1 Right,Anterior Lower Leg o Cleanse wound with mild soap and water o May Shower, gently pat wound dry prior to applying new dressing. Anesthetic Wound #1 Right,Anterior Lower Leg o Topical Lidocaine 4% cream applied to wound bed prior to debridement Primary Wound Dressing Wound #1 Right,Anterior Lower Leg o  Promogran Secondary Dressing Wound #1 Right,Anterior Lower Leg o Boardered Foam Dressing Dressing Change Frequency o Change Dressing Monday, Wednesday, Friday Follow-up Appointments Wound #1 Right,Anterior Lower Leg o Return Appointment in 1 week. Edema Control Wound #1 Right,Anterior Lower Leg o Tubigrip Electronic Signature(s) Signed: 10/20/2015 4:30:30 PM By: Loletha Grayer MD Signed: 10/20/2015 5:05:32 PM By: Alberteen Sam, Colleen ([id number]) Entered By: Montey Hora on 10/20/2015 14:30:35 JERRINE, MILLESON ([id number]) -------------------------------------------------------------------------------- Problem List Details Patient Name: Angela Franco Date of Service: 10/20/2015 2:00 PM Medical Record Patient Account Number: [id number] Number: Treating RN: Montey Hora November 15, 1929 (81 y.o. 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 I48.2 Chronic atrial fibrillation 09/22/2015 Yes N18.3 Chronic kidney disease, stage 3 (moderate) 09/22/2015 Yes R60.0 Localized edema 09/22/2015 Yes Inactive Problems Resolved Problems Electronic Signature(s) Signed: 10/20/2015 4:30:30 PM By: Loletha Grayer MD Entered By: Loletha Grayer on 10/20/2015 15:23:49 Angela Franco ([id number]) -------------------------------------------------------------------------------- Progress Note Details Patient Name: Angela Franco Date of Service: 10/20/2015 2:00 PM Medical Record Patient Account Number: [id number] Number: Treating RN: Montey Hora 09/24/1929 (81 y.o. Other Clinician: Date of Birth/Sex: Female) Treating BURNS III, Primary Care  Physician/Extender: Betsey Holiday Physician: Referring Physician: Joycelyn Das in Treatment: 4 Subjective Chief Complaint Information obtained from Patient Right calf traumatic ulceration. History of Present Illness (HPI) Very pleasant 81 year old with history of atrial fibrillation (on Eliquis) and chronic kidney disease. No history of diabetes or PAD. Right ABI 1.06. She fell and traumatized her right anterior calf in September 2016. She describes significant swelling and bruising. She reportedly developed cellulitis, and completed a course of levofloxacin. Ambulating per her baseline. No significant pain. No ischemic rest pain or claudication. Performing dressing changes with polysporin and using a Tubigrip for edema control. No new complaints today. No fever or chills. Minimal serosanguineous drainage. Objective Constitutional Pulse regular. Respirations normal and unlabored. Afebrile. Vitals Time Taken: 2:16 PM, Height: 65 in, Weight: 147 lbs, BMI: 24.5, Temperature: 98.1 F, Pulse: 72 bpm, Respiratory Rate: 18 breaths/min, Blood Pressure: 139/79 mmHg. General Notes: Right anterior calf ulceration improved. Full-thickness. Healthy granulating base. Minimal biofilm removed. One plus pitting edema. No significant cellulitis. Palpable DP. Right ABI 1.06. Integumentary (Hair, Skin) Wound #1 status is Open. Original cause of wound was Trauma. The wound is located on the Right,Anterior Lower Leg. The wound measures 0.3cm length x 0.4cm width x 0.1cm depth; 0.094cm^2 area and 0.009cm^3 volume. The wound is limited to skin breakdown. There is no tunneling or undermining [place], Sheana ([id number]) noted. There is a small amount of drainage noted. The wound margin is indistinct and nonvisible. There is large (67-100%) red granulation within the wound bed. There is no necrotic tissue within the wound bed. The periwound skin appearance exhibited: Localized Edema, Moist. The  periwound skin appearance did not exhibit: Callus, Crepitus, Excoriation, Fluctuance, Friable, Induration, Rash, Scarring, Dry/Scaly, Maceration, Atrophie Blanche, Cyanosis, Ecchymosis, Hemosiderin Staining, Mottled, Pallor, Rubor, Erythema. Periwound temperature was noted as No Abnormality. Assessment Active Problems ICD-10 L97.212 - Non-pressure chronic ulcer of right calf with fat layer exposed I48.2 - Chronic atrial fibrillation N18.3 - Chronic kidney disease, stage 3 (moderate) R60.0 - Localized edema Right anterior calf traumatic ulceration, almost healed. Plan Wound Cleansing: Wound #1 Right,Anterior Lower Leg: Cleanse wound with mild soap and water May Shower, gently pat wound dry prior to applying new dressing. Anesthetic: Wound #1 Right,Anterior Lower Leg: Topical Lidocaine 4% cream applied to wound bed prior to debridement Primary Wound Dressing: Wound #1 Right,Anterior Lower Leg: Promogran Secondary Dressing: Wound #1 Right,Anterior Lower Leg: Boardered Foam Dressing Dressing Change Frequency: Change Dressing Monday, Wednesday, Friday Follow-up Appointments: Wound #1 Right,Anterior Lower Leg: Return Appointment in 1 week. Edema Control: Wound #1 Right,Anterior Lower Leg: Ortwein, Eleri ([id number]) Tubigrip Dressing changes with Promogran (collagen) and edema control with Tubigrip. Electronic Signature(s) Signed: 10/20/2015 4:30:30 PM By: Loletha Grayer MD Entered By: Loletha Grayer on 10/20/2015 15:25:15  LAURIANNE, QUERTERMOUS ([id number]) -------------------------------------------------------------------------------- SuperBill Details Patient Name: IMOJEAN, MUNDORF Date of Service: 10/20/2015 Medical Record Patient Account Number: [id number] Number: Treating RN: Montey Hora [birth year] (81 y.o. Other Clinician: Date of Birth/Sex: Female) Treating BURNS III, Primary Care Physician/Extender: Betsey Holiday Physician: Weeks in  Treatment: 4 Referring Physician: Reymundo Poll Diagnosis Coding ICD-10 Codes Code Description [phone] Non-pressure chronic ulcer of right calf with fat layer exposed I48.2 Chronic atrial fibrillation N18.3 Chronic kidney disease, stage 3 (moderate) R60.0 Localized edema Facility Procedures CPT4 Code: [id number] Description: 99213 - WOUND CARE VISIT-LEV 3 EST PT Modifier: Quantity: 1 Physician Procedures CPT4 Code: [id number] Description: [id number] - WC PHYS LEVEL 2 - EST PT ICD-10 Description Diagnosis L97.212 Non-pressure chronic ulcer of right calf with fat Modifier: layer exposed Quantity: 1 Electronic Signature(s) Signed: 10/20/2015 4:30:30 PM By: Loletha Grayer MD Entered By: Loletha Grayer on 10/20/2015 15:25:35

## 2015-10-21 NOTE — Progress Notes (Addendum)
JAQUASIA, SMEE ([id number]) Visit Report for 10/20/2015 Arrival Information Details Patient Name: HOLIDAY, NURNBERGER Date of Service: 10/20/2015 2:00 PM Medical Record Patient Account Number: [id number] Number: Treating RN: Montey Hora 1929/05/03 (81 y.o. Other Clinician: Date of Birth/Sex: Female) Treating BURNS III, Primary Care Physician: Reymundo Poll Physician/Extender: Thayer Jew Referring Physician: Joycelyn Das in Treatment: 4 Visit Information History Since Last Visit Added or deleted any medications: No Patient Arrived: Ambulatory Any new allergies or adverse reactions: No Arrival Time: 14:13 Had a fall or experienced change in No Accompanied By: dtr activities of daily living that may affect Transfer Assistance: None risk of falls: Patient Identification Verified: Yes Signs or symptoms of abuse/neglect since last No Secondary Verification Process Yes visito Completed: Hospitalized since last visit: No Patient Requires Transmission- No Pain Present Now: No Based Precautions: Patient Has Alerts: Yes Patient Alerts: Patient on Blood Thinner ELiquis, ABI;L:1.13 R:1.06 Electronic Signature(s) Signed: 10/20/2015 5:05:32 PM By: Montey Hora Entered By: Montey Hora on 10/20/2015 14:14:10 [name] ([id number]) -------------------------------------------------------------------------------- Clinic Level of Care Assessment Details Patient Name: [name] Date of Service: 10/20/2015 2:00 PM Medical Record Patient Account Number: [id number] Number: Treating RN: Montey Hora [birth year] (81 y.o. Other Clinician: Date of Birth/Sex: Female) Treating BURNS III, Primary Care Physician: Reymundo Poll Physician/Extender: Thayer Jew Referring Physician: Joycelyn Das in Treatment: 4 Clinic Level of Care Assessment Items TOOL 4 Quantity Score []  - Use when only an EandM is performed on FOLLOW-UP visit 0 ASSESSMENTS - Nursing  Assessment / Reassessment X - Reassessment of Co-morbidities (includes updates in patient status) 1 10 X - Reassessment of Adherence to Treatment Plan 1 5 ASSESSMENTS - Wound and Skin Assessment / Reassessment X - Simple Wound Assessment / Reassessment - one wound 1 5 []  - Complex Wound Assessment / Reassessment - multiple wounds 0 []  - Dermatologic / Skin Assessment (not related to wound area) 0 ASSESSMENTS - Focused Assessment X - Circumferential Edema Measurements - multi extremities 1 5 []  - Nutritional Assessment / Counseling / Intervention 0 X - Lower Extremity Assessment (monofilament, tuning fork, pulses) 1 5 []  - Peripheral Arterial Disease Assessment (using hand held doppler) 0 ASSESSMENTS - Ostomy and/or Continence Assessment and Care []  - Incontinence Assessment and Management 0 []  - Ostomy Care Assessment and Management (repouching, etc.) 0 PROCESS - Coordination of Care X - Simple Patient / Family Education for ongoing care 1 15 []  - Complex (extensive) Patient / Family Education for ongoing care 0 []  - Staff obtains Programmer, systems, Records, Test Results / Process Orders 0 []  - Staff telephones HHA, Nursing Homes / Clarify orders / etc 0 Helget, Landa ([id number]) []  - Routine Transfer to another Facility (non-emergent condition) 0 []  - Routine Hospital Admission (non-emergent condition) 0 []  - New Admissions / Biomedical engineer / Ordering NPWT, Apligraf, etc. 0 []  - Emergency Hospital Admission (emergent condition) 0 X - Simple Discharge Coordination 1 10 []  - Complex (extensive) Discharge Coordination 0 PROCESS - Special Needs []  - Pediatric / Minor Patient Management 0 []  - Isolation Patient Management 0 []  - Hearing / Language / Visual special needs 0 []  - Assessment of Community assistance (transportation, D/C planning, etc.) 0 []  - Additional assistance / Altered mentation 0 []  - Support Surface(s) Assessment (bed, cushion, seat, etc.) 0 INTERVENTIONS - Wound  Cleansing / Measurement X - Simple Wound Cleansing - one wound 1 5 []  - Complex Wound Cleansing - multiple wounds 0 X - Wound Imaging (photographs - any number of wounds) 1 5 []  -  Wound Tracing (instead of photographs) 0 X - Simple Wound Measurement - one wound 1 5 []  - Complex Wound Measurement - multiple wounds 0 INTERVENTIONS - Wound Dressings X - Small Wound Dressing one or multiple wounds 1 10 []  - Medium Wound Dressing one or multiple wounds 0 []  - Large Wound Dressing one or multiple wounds 0 []  - Application of Medications - topical 0 []  - Application of Medications - injection 0 Mundo, Kary ([id number]) INTERVENTIONS - Miscellaneous []  - External ear exam 0 []  - Specimen Collection (cultures, biopsies, blood, body fluids, etc.) 0 []  - Specimen(s) / Culture(s) sent or taken to Lab for analysis 0 []  - Patient Transfer (multiple staff / Harrel Lemon Lift / Similar devices) 0 []  - Simple Staple / Suture removal (25 or less) 0 []  - Complex Staple / Suture removal (26 or more) 0 []  - Hypo / Hyperglycemic Management (close monitor of Blood Glucose) 0 []  - Ankle / Brachial Index (ABI) - do not check if billed separately 0 X - Vital Signs 1 5 Has the patient been seen at the hospital within the last three years: Yes Total Score: 85 Level Of Care: New/Established - Level 3 Electronic Signature(s) Signed: 10/20/2015 5:05:32 PM By: Montey Hora Entered By: Montey Hora on 10/20/2015 14:31:21 [name] ([id number]) -------------------------------------------------------------------------------- Encounter Discharge Information Details Patient Name: [name] Date of Service: 10/20/2015 2:00 PM Medical Record Patient Account Number: [id number] Number: Treating RN: Montey Hora [birth year] (81 y.o. Other Clinician: Date of Birth/Sex: Female) Treating BURNS III, Primary Care Physician: Reymundo Poll Physician/Extender: Thayer Jew Referring Physician: Joycelyn Das in Treatment: 4 Encounter Discharge Information Items Schedule Follow-up Appointment: No Medication Reconciliation completed and provided to Patient/Care No Amonie Wisser: Provided on Clinical Summary of Care: 10/20/2015 Form Type Recipient Paper Patient DP Electronic Signature(s) Signed: 10/20/2015 2:40:52 PM By: Ruthine Dose Entered By: Ruthine Dose on 10/20/2015 14:40:52 [name] ([id number]) -------------------------------------------------------------------------------- Lower Extremity Assessment Details Patient Name: [name] Date of Service: 10/20/2015 2:00 PM Medical Record Patient Account Number: [id number] Number: Treating RN: Montey Hora Oct 20, 1929 (81 y.o. Other Clinician: Date of Birth/Sex: Female) Treating BURNS III, Primary Care Physician: Reymundo Poll Physician/Extender: Thayer Jew Referring Physician: Joycelyn Das in Treatment: 4 Edema Assessment Assessed: [Left: No] [Right: No] Edema: [Left: Ye] [Right: s] Calf Left: Right: Point of Measurement: 35 cm From Medial Instep cm 36.7 cm Ankle Left: Right: Point of Measurement: 10 cm From Medial Instep cm 23.5 cm Vascular Assessment Pulses: Posterior Tibial Dorsalis Pedis Palpable: [Right:Yes] Extremity colors, hair growth, and conditions: Extremity Color: [Right:Hyperpigmented] Hair Growth on Extremity: [Right:No] Temperature of Extremity: [Right:Warm] Capillary Refill: [Right:< 3 seconds] Electronic Signature(s) Signed: 10/20/2015 5:05:32 PM By: Montey Hora Entered By: Montey Hora on 10/20/2015 14:21:56 [name] ([id number]) -------------------------------------------------------------------------------- Multi Wound Chart Details Patient Name: [name] Date of Service: 10/20/2015 2:00 PM Medical Record Patient Account Number: [id number] Number: Treating RN: Montey Hora Dec 19, 1928 (81 y.o. Other Clinician: Date of  Birth/Sex: Female) Treating BURNS III, Primary Care Physician: Reymundo Poll Physician/Extender: Thayer Jew Referring Physician: Joycelyn Das in Treatment: 4 Vital Signs Height(in): 65 Pulse(bpm): 72 Weight(lbs): 147 Blood Pressure 139/79 (mmHg): Body Mass Index(BMI): 24 Temperature(F): 98.1 Respiratory Rate 18 (breaths/min): Photos: [1:No Photos] [N/A:N/A] Wound Location: [1:Right Lower Leg - Anterior N/A] Wounding Event: [1:Trauma] [N/A:N/A] Primary Etiology: [1:Trauma, Other] [N/A:N/A] Comorbid History: [1:Cataracts, Anemia, Arrhythmia, Hypertension, Osteoarthritis] [N/A:N/A] Date Acquired: [1:08/29/2015] [N/A:N/A] Weeks of Treatment: [1:4] [N/A:N/A] Wound Status: [1:Open] [N/A:N/A] Measurements L x W x D 0.3x0.4x0.1 [N/A:N/A] (cm)  Area (cm) : [1:0.094] [N/A:N/A] Volume (cm) : [1:0.009] [N/A:N/A] % Reduction in Area: [1:98.30%] [N/A:N/A] % Reduction in Volume: 98.40% [N/A:N/A] Classification: [1:Partial Thickness] [N/A:N/A] Exudate Amount: [1:Small] [N/A:N/A] Wound Margin: [1:Indistinct, nonvisible] [N/A:N/A] Granulation Amount: [1:Large (67-100%)] [N/A:N/A] Granulation Quality: [1:Red] [N/A:N/A] Necrotic Amount: [1:None Present (0%)] [N/A:N/A] Exposed Structures: [1:Fascia: No Fat: No Tendon: No Muscle: No Joint: No Bone: No] [N/A:N/A] Limited to Skin Breakdown Epithelialization: Medium (34-66%) N/A N/A Periwound Skin Texture: Edema: Yes N/A N/A Excoriation: No Induration: No Callus: No Crepitus: No Fluctuance: No Friable: No Rash: No Scarring: No Periwound Skin Moist: Yes N/A N/A Moisture: Maceration: No Dry/Scaly: No Periwound Skin Color: Atrophie Blanche: No N/A N/A Cyanosis: No Ecchymosis: No Erythema: No Hemosiderin Staining: No Mottled: No Pallor: No Rubor: No Temperature: No Abnormality N/A N/A Tenderness on No N/A N/A Palpation: Wound Preparation: Ulcer Cleansing: N/A N/A Rinsed/Irrigated with Saline Topical Anesthetic Applied:  Other: Lidocaine 4% Treatment Notes Electronic Signature(s) Signed: 10/20/2015 5:05:32 PM By: Montey Hora Entered By: Montey Hora on 10/20/2015 14:22:37 DELEON, SPRENKLE ([id number]) -------------------------------------------------------------------------------- Multi-Disciplinary Care Plan Details Patient Name: [name] Date of Service: 10/20/2015 2:00 PM Medical Record Patient Account Number: [id number] Number: Treating RN: Montey Hora May 23, 1929 (81 y.o. Other Clinician: Date of Birth/Sex: Female) Treating BURNS III, Primary Care Physician: Reymundo Poll Physician/Extender: Thayer Jew Referring Physician: Joycelyn Das in Treatment: 4 Active Inactive Electronic Signature(s) Signed: 11/12/2015 5:05:36 PM By: Gretta Cool RN, BSN, Kim RN, BSN Signed: 12/16/2015 5:06:34 PM By: Montey Hora Previous Signature: 10/20/2015 5:05:32 PM Version By: Montey Hora Entered By: Gretta Cool RN, BSN, Kim on 11/10/2015 16:41:35 [name] ([id number]) -------------------------------------------------------------------------------- Wound Assessment Details Patient Name: [name] Date of Service: 10/20/2015 2:00 PM Medical Record Patient Account Number: [id number] Number: Treating RN: Montey Hora 10/01/1929 (81 y.o. Other Clinician: Date of Birth/Sex: Female) Treating BURNS III, Primary Care Physician: Reymundo Poll Physician/Extender: Thayer Jew Referring Physician: Joycelyn Das in Treatment: 4 Wound Status Wound Number: 1 Primary Trauma, Other Etiology: Wound Location: Right Lower Leg - Anterior Wound Open Wounding Event: Trauma Status: Date Acquired: 08/29/2015 Comorbid Cataracts, Anemia, Arrhythmia, Weeks Of Treatment: 4 History: Hypertension, Osteoarthritis Clustered Wound: No Photos Photo Uploaded By: Montey Hora on 10/20/2015 14:59:32 Wound Measurements Length: (cm) 0.3 Width: (cm) 0.4 Depth: (cm) 0.1 Area: (cm)  0.094 Volume: (cm) 0.009 % Reduction in Area: 98.3% % Reduction in Volume: 98.4% Epithelialization: Medium (34-66%) Tunneling: No Undermining: No Wound Description Classification: Partial Thickness Foul Odor After Wound Margin: Indistinct, nonvisible Exudate Amount: Small Cleansing: No Wound Bed Granulation Amount: Large (67-100%) Exposed Structure Granulation Quality: Red Fascia Exposed: No Necrotic Amount: None Present (0%) Fat Layer Exposed: No Tendon Exposed: No Muscle Exposed: No Vallier, Noelly ([id number]) Joint Exposed: No Bone Exposed: No Limited to Skin Breakdown Periwound Skin Texture Texture Color No Abnormalities Noted: No No Abnormalities Noted: No Callus: No Atrophie Blanche: No Crepitus: No Cyanosis: No Excoriation: No Ecchymosis: No Fluctuance: No Erythema: No Friable: No Hemosiderin Staining: No Induration: No Mottled: No Localized Edema: Yes Pallor: No Rash: No Rubor: No Scarring: No Temperature / Pain Moisture Temperature: No Abnormality No Abnormalities Noted: No Dry / Scaly: No Maceration: No Moist: Yes Wound Preparation Ulcer Cleansing: Rinsed/Irrigated with Saline Topical Anesthetic Applied: Other: Lidocaine 4%, Electronic Signature(s) Signed: 10/20/2015 5:05:32 PM By: Montey Hora Entered By: Montey Hora on 10/20/2015 14:22:19 KWYNN, MCCULLICK ([id number]) -------------------------------------------------------------------------------- Vitals Details Patient Name: [name] Date of Service: 10/20/2015 2:00 PM Medical Record Patient Account Number: [id number] Number: Treating RN: Montey Hora Oct 15, 1929 (81 y.o.  Other Clinician: Date of Birth/Sex: Female) Treating BURNS III, Primary Care Physician: Reymundo Poll Physician/Extender: Thayer Jew Referring Physician: Joycelyn Das in Treatment: 4 Vital Signs Time Taken: 14:16 Temperature (F): 98.1 Height (in): 65 Pulse (bpm): 72 Weight (lbs):  147 Respiratory Rate (breaths/min): 18 Body Mass Index (BMI): 24.5 Blood Pressure (mmHg): 139/79 Reference Range: 80 - 120 mg / dl Electronic Signature(s) Signed: 10/20/2015 5:05:32 PM By: Montey Hora Entered By: Montey Hora on 10/20/2015 14:17:09

## 2015-10-25 ENCOUNTER — Telehealth: Payer: Self-pay

## 2015-10-25 ENCOUNTER — Other Ambulatory Visit (INDEPENDENT_AMBULATORY_CARE_PROVIDER_SITE_OTHER): Payer: Self-pay

## 2015-10-25 DIAGNOSIS — Z0289 Encounter for other administrative examinations: Secondary | ICD-10-CM

## 2015-10-25 DIAGNOSIS — E871 Hypo-osmolality and hyponatremia: Secondary | ICD-10-CM

## 2015-10-25 NOTE — Telephone Encounter (Signed)
From Angela Franco - Patient wanted you to know 5 mg of Oxycodone is what she takes it everyday every 6 hours...she said she was letting you know and that she needs a new rx...she might need a higher strength...please advise and let pt know when rx is ready.

## 2015-10-25 NOTE — Telephone Encounter (Signed)
Duplicate task.

## 2015-10-25 NOTE — Telephone Encounter (Signed)
Labs ordered by Dr. Krista Blue - drawn today - result pending.  She may refer to [facility] for [facility] evaluation.

## 2015-10-25 NOTE — Addendum Note (Signed)
Addended by: Noberto Retort C on: 10/25/2015 02:49 PM   Modules accepted: Orders

## 2015-10-25 NOTE — Telephone Encounter (Signed)
Per Dr. Krista Blue, will transition patient to Fentanyl 57mcg patches.  Left message for [name] to return my call.  If agreeable, the prescription will need to be picked up from our office.

## 2015-10-25 NOTE — Telephone Encounter (Signed)
Patient wanted you to know 5 mg of Oxycodone is what she takes it everyday every 6 hours...she said she was letting you know and that she needs a new rx...she might need a higher strength...please advise and let pt know when rx is ready

## 2015-10-25 NOTE — Telephone Encounter (Signed)
Pt's daughter called sts pt has hyponatremia (low Sodium) and trileptal can cause a depletion of sodium. She sts she called poison control on Friday (10/23/15) as she thought pt had taken 2 pills back to back but pt took a different medication. She stayed with pt Friday night as pt was very confused, legs would not move and very breathless, pt knew something was wrong. A son stayed with her Saturday night and her other son stayed with on Sunday during the day. Pt has improved a little everyday. She is breathless today and using oxygen, very fatigue,still having trigeminal pain. She said on Saturday she started decreasing Trileptal 150mg  tid to 1tab in am then 1/2 tab at lunch and 1/2 tab in the evening. Yesterday 1/2 tab am, 1/2 at lunch and 1/2 tab evening. Daughter is inquiring if sodium levels should be checked and if pt needs to see Dr Krista Blue.

## 2015-10-25 NOTE — Telephone Encounter (Signed)
Daughter/Deborah called back, states no one has called her back. Please call [phone].

## 2015-10-26 ENCOUNTER — Telehealth: Payer: Self-pay | Admitting: Neurology

## 2015-10-26 DIAGNOSIS — G500 Trigeminal neuralgia: Secondary | ICD-10-CM

## 2015-10-26 LAB — COMPREHENSIVE METABOLIC PANEL
A/G RATIO: 0.8 — AB (ref 1.1–2.5)
ALT: 12 IU/L (ref 0–32)
AST: 25 IU/L (ref 0–40)
Albumin: 3.8 g/dL (ref 3.5–4.7)
Alkaline Phosphatase: 78 IU/L (ref 39–117)
BUN/Creatinine Ratio: 17 (ref 11–26)
BUN: 27 mg/dL (ref 8–27)
Bilirubin Total: 0.3 mg/dL (ref 0.0–1.2)
CALCIUM: 9.4 mg/dL (ref 8.7–10.3)
CO2: 25 mmol/L (ref 18–29)
Chloride: 93 mmol/L — ABNORMAL LOW (ref 97–106)
Creatinine, Ser: 1.59 mg/dL — ABNORMAL HIGH (ref 0.57–1.00)
GFR calc Af Amer: 34 mL/min/{1.73_m2} — ABNORMAL LOW (ref >59)
GFR calc non Af Amer: 30 mL/min/{1.73_m2} — ABNORMAL LOW (ref >59)
Globulin, Total: 4.7 g/dL — ABNORMAL HIGH (ref 1.5–4.5)
Glucose: 94 mg/dL (ref 65–99)
POTASSIUM: 5.5 mmol/L — AB (ref 3.5–5.2)
SODIUM: 133 mmol/L — AB (ref 136–144)
TOTAL PROTEIN: 8.5 g/dL (ref 6.0–8.5)

## 2015-10-26 MED ORDER — OXYCODONE HCL 5 MG PO TABS: ORAL_TABLET | ORAL | Status: DC

## 2015-10-26 MED ORDER — FENTANYL 25 MCG/HR TD PT72: 25.0000 ug | MEDICATED_PATCH | TRANSDERMAL | Status: DC

## 2015-10-26 NOTE — Telephone Encounter (Signed)
Pt's daughter called and is calling about the new Rx that is supposed to be waiting for her at the front desk. She states that is different than the Oxycodone. Daughter is persistent in wanting to speak with the nurse. Nurse has been skyped and witll take call.

## 2015-10-26 NOTE — Telephone Encounter (Signed)
Dr. Krista Blue has spoken with patient's daughter - see other task for further information.

## 2015-10-26 NOTE — Telephone Encounter (Signed)
I have discussed with her daughter, patient continue has frequent right facial pain, despite gabapentin 600 mg 3 times a day, Trileptal 150 mg 3 times a day cause increased confusion, weakness, she can tolerate it better at half tablets 3 times a day, she is also taking oxycodone 5 mg half tablets as needed before meal,  Continue has frequent right facial pain, laboratory showed low sodium 133, she had a history of hyponatremia with carbamazepin use in the past  I have advised her  1, keep gabapentin 600 mg 3 times a day, 2. Trileptal 150 mg half tablets 3 times a day 3, oxycodone 5 mg half tablet as needed, new prescription of 90 tablets was provided 4. Fentanyl patch 25 g every 3 days 5, [facility] neurosurgeon Dr. Angelene Giovanni for potential gamma knife evaluation

## 2015-11-02 ENCOUNTER — Encounter: Payer: Self-pay | Admitting: Internal Medicine

## 2015-11-02 ENCOUNTER — Ambulatory Visit (INDEPENDENT_AMBULATORY_CARE_PROVIDER_SITE_OTHER): Payer: Medicare Other | Admitting: Internal Medicine

## 2015-11-02 VITALS — BP 130/88 | HR 79 | Ht 63.0 in | Wt 150.0 lb

## 2015-11-02 DIAGNOSIS — R0602 Shortness of breath: Secondary | ICD-10-CM | POA: Diagnosis not present

## 2015-11-02 DIAGNOSIS — I428 Other cardiomyopathies: Secondary | ICD-10-CM

## 2015-11-02 DIAGNOSIS — R609 Edema, unspecified: Secondary | ICD-10-CM | POA: Diagnosis not present

## 2015-11-02 DIAGNOSIS — Z5181 Encounter for therapeutic drug level monitoring: Secondary | ICD-10-CM

## 2015-11-02 DIAGNOSIS — I429 Cardiomyopathy, unspecified: Secondary | ICD-10-CM

## 2015-11-02 DIAGNOSIS — I482 Chronic atrial fibrillation, unspecified: Secondary | ICD-10-CM

## 2015-11-02 DIAGNOSIS — I5043 Acute on chronic combined systolic (congestive) and diastolic (congestive) heart failure: Secondary | ICD-10-CM | POA: Diagnosis not present

## 2015-11-02 DIAGNOSIS — I272 Other secondary pulmonary hypertension: Secondary | ICD-10-CM

## 2015-11-02 MED ORDER — FUROSEMIDE 80 MG PO TABS: 80.0000 mg | ORAL_TABLET | Freq: Every day | ORAL | Status: DC

## 2015-11-02 NOTE — Progress Notes (Signed)
OFFICE NOTE  Chief Complaint:  Weight gain and shortness of breath  Primary Care Physician: Reymundo Poll, MD  HPI:  Angela Franco is an 81 year old female who was recently hospitalized for hyponatremia and atrial fibrillation. She had a cardiac catheterization last summer that showed an EF of 30% to 35%; however, this improved to 40% to 45% with a repeat echocardiogram. She also has a chronic left bundle branch block. She has had problems with encephalopathy and was on Tegretol in the past. These symptoms have improved; however, still has persistent atrial fibrillation. In addition, during her hospitalization, it was complicated by anemia and was found to be iron deficient. She also had a GI workup, which did not reveal any bleeding source. She was taken off of warfarin, which she was taking at the time, but was never restarted. Overall, she seems much better and in fact is not complaining of nearly as many problems since her hospitalization. It is also important to remember that she had a trigeminal neuralgia as well that stopped.   At her last office visit I recommended starting Eliquis which she has been taking without any bleeding problems. She is maintaining atrial fibrillation today which is probably permanent A. fib at this point. She denies any worsening shortness of breath or chest pain.  I saw Laqunda Gatt back in the office today. She seems to be doing fairly well. She denies any worsening shortness of breath, weight gain or swelling. She is tolerating Eliquis without any bleeding, occasions. She takes Lasix 3 times a week. She was recently told by her primary care Orah Sonnen that she does have some mild chronic kidney disease. This needs to be reassessed. She was also supposed to have an echocardiogram prior to this visit as her last EF was 40-45% in 2013.  [name] returns today for follow-up. She is experiencing some increased shortness of breath and weight gain. Her weights up  about 7 pounds since her last office visit. She's noted some worsening leg swelling and even some swelling in her face. She's had an ongoing lower extremity wound problem which she is getting care at the wound care center. She is also dealing with trigeminal neuralgia which is quite painful. She reports some worsening shortness of breath and orthopnea. She is needed to use oxygen at night the last few evenings.  PMHx:  Past Medical History  Diagnosis Date  . COPD (chronic obstructive pulmonary disease) ([place])     Never smoked. Questionable diagnosis.  . Atrial fibrillation ([place])   . Osteopenia   . Insomnia   . Hyponatremia   . Hypertension   . UTI (lower urinary tract infection)   . Bursitis of hip   . Trigeminal neuralgia   . Altered mental status 08/24/2012  . Anemia, with significant drop in H/H 08/24/2012  . CHF (congestive heart failure) ([place])   . Heart disease   . Acute facial pain   . LBBB (left bundle branch block)   . Nonischemic cardiomyopathy ([place])   . Dyslipidemia   . Kidney failure     Stage 3    Past Surgical History  Procedure Laterality Date  . Appendectomy    . Tonsillectomy and adenoidectomy    . Cardiac catheterization  07/04/2012    normal coronaries, EF 30-35%, elevated R & L heart pressures, reduced CO, mild pulm HTN (Dr. K. Mali Hilty)  . Transthoracic echocardiogram  2013    mild conc LVH, RV mildly dilated; LA severely dilated; RA  mod dilated; mild-mod mitral annular calcif, calcif of anterior & posterior MV leaflet, mod MR; mod-severe TR with RSVP 30-74mmHg; mild calcif of AV leaflets and mod aortic regurg; mild pulm valve regurg; pericardial effusion (right)  . Nm myocar perf wall motion  2011    lexiscan myoview - normal LV systolic function w/normal wall motion, no evidence of scar/ischemia  . Left and right heart catheterization with coronary angiogram N/A 07/04/2012    Procedure: LEFT AND RIGHT HEART CATHETERIZATION WITH CORONARY ANGIOGRAM;  Surgeon:  Pixie Casino, MD;  Location: [facility] CATH LAB;  Service: Cardiovascular;  Laterality: N/A;    FAMHx:  Family History  Problem Relation Age of Onset  . Stroke Father   . Heart attack Father   . Heart attack Brother   . Heart attack Brother   . Alzheimer's disease Mother     SOCHx:   reports that she has never smoked. She has never used smokeless tobacco. She reports that she does not drink alcohol or use illicit drugs.  ALLERGIES:  Allergies  Allergen Reactions  . Penicillins Hives    ROS: A comprehensive review of systems was negative except for: Respiratory: positive for dyspnea on exertion Cardiovascular: positive for orthopnea and paroxysmal nocturnal dyspnea Neurological: positive for Trigeminal neuralgia  HOME MEDS: Current Outpatient Prescriptions  Medication Sig Dispense Refill  . apixaban (ELIQUIS) 2.5 MG TABS tablet Take 2.5 mg by mouth 2 (two) times daily.    . calcium-vitamin D (OSCAL WITH D) 500-200 MG-UNIT tablet Take 1 tablet by mouth daily with breakfast.    . carvedilol (COREG) 6.25 MG tablet Take 1 tablet (6.25 mg total) by mouth 2 (two) times daily with a meal. 60 tablet 0  . docusate sodium 100 MG CAPS Take 200 mg by mouth at bedtime as needed. 60 capsule 0  . fentaNYL (DURAGESIC - DOSED MCG/HR) 25 MCG/HR patch Place 1 patch (25 mcg total) onto the skin every 3 (three) days. 11 patch 0  . ferrous sulfate 325 (65 FE) MG tablet Take 325 mg by mouth daily with breakfast.    . gabapentin (NEURONTIN) 600 MG tablet Take 1 tablet (600 mg total) by mouth 3 (three) times daily. Please dose at 8am, 2pm and 8pm 270 tablet 3  . OXcarbazepine (TRILEPTAL) 150 MG tablet Half tablets twice a day,  may increase to one tablets twice a day in one week (Patient taking differently: Half tablets three times a day,  may increase to one tablets twice a day in one week) 60 tablet 3  . oxyCODONE (OXY IR/ROXICODONE) 5 MG immediate release tablet 1/2 tab po as needed q8 hours 90 tablet 0    . potassium chloride SA (K-DUR,KLOR-CON) 20 MEQ tablet Take 1 tablet (20 mEq total) by mouth 2 (two) times daily. 60 tablet 0  . simvastatin (ZOCOR) 10 MG tablet Take 5 mg by mouth daily at 6 PM.     . temazepam (RESTORIL) 15 MG capsule Take 15 mg by mouth at bedtime.     [name] tiotropium (SPIRIVA) 18 MCG inhalation capsule Place 18 mcg into inhaler and inhale daily.    . furosemide (LASIX) 80 MG tablet Take 1 tablet (80 mg total) by mouth daily. 30 tablet 11   No current facility-administered medications for this visit.    LABS/IMAGING: No results found for this or any previous visit (from the past 48 hour(s)). No results found.  VITALS: BP 130/88 mmHg  Pulse 79  Ht 5\' 3"  (1.6 m)  Wt  150 lb (68.04 kg)  BMI 26.58 kg/m2  SpO2 88%  EXAM: General appearance: alert and no distress Neck: JVD - 9 cm above sternal notch and no carotid bruit Lungs: diminished breath sounds bilaterally and rales bibasilar Heart: irregularly irregular rhythm Abdomen: soft, non-tender; bowel sounds normal; no masses,  no organomegaly Extremities: edema 1+ pitting bilaterally and venous stasis dermatitis noted Pulses: 2+ and symmetric Skin: Skin color, texture, turgor normal. No rashes or lesions Neurologic: Grossly normal Psych: Mood, affect normal  EKG: Atrial fibrillation at 81, left bundle branch block (chronic)  ASSESSMENT: 1. Permanent atrial fibrillation - on Eliquis 2. Left bundle Branch block 3. Ischemic cardiomyopathy EF 40-45% 4. Dyslipidemia 5. Acute on chronic systolic/diastolic congestive heart failure  6. CKD 4  PLAN: 1.   Mrs. Thorburn  has had worsening swelling and signs and symptoms of congestive heart failure. She does have significant chronic kidney disease and follows with Dr. Loletha Grayer.  She was currently taking Lasix 3 times weekly. I've asked her to increase her Lasix to 80 mg twice daily for the next 5 days and then we will recheck a metabolic profile and BNP. If her weight is gotten  back to normal then I would recommend switching her to Lasix 80 mg daily. She will need close follow-up with her nephrologist. I will schedule her a follow-up appointment in the mid-level [facility] next week.   Pixie Casino, MD, [facility] Attending Cardiologist Agency 11/02/2015, 6:09 PM

## 2015-11-02 NOTE — Patient Instructions (Signed)
Medication Instructions:  INCREASE Furosemide (Lasix) - Take 80 mg TWICE DAILY FOR 5 DAYS (Wednesday through Sunday). On Monday, take 80 mg ONCE DAILY.  Labwork: Your physician recommends that you return for lab work on Monday.  Testing/Procedures: NONE  Follow-Up: Dr Debara Pickett recommends that you schedule a follow-up appointment in 1 week with an extender.  Dr Debara Pickett recommends that you schedule a follow-up appointment in 4-6 weeks with him.  If you need a refill on your cardiac medications before your next appointment, please call your pharmacy.

## 2015-11-03 ENCOUNTER — Ambulatory Visit: Payer: Medicare Other | Admitting: Surgery

## 2015-11-08 LAB — BASIC METABOLIC PANEL
BUN: 39 mg/dL — AB (ref 7–25)
CALCIUM: 9.2 mg/dL (ref 8.6–10.4)
CO2: 32 mmol/L — AB (ref 20–31)
Chloride: 93 mmol/L — ABNORMAL LOW (ref 98–110)
Creat: 1.69 mg/dL — ABNORMAL HIGH (ref 0.60–0.88)
GLUCOSE: 100 mg/dL — AB (ref 65–99)
Potassium: 3.9 mmol/L (ref 3.5–5.3)
SODIUM: 135 mmol/L (ref 135–146)

## 2015-11-08 LAB — BRAIN NATRIURETIC PEPTIDE: Brain Natriuretic Peptide: 92 pg/mL (ref 0.0–100.0)

## 2015-11-10 ENCOUNTER — Ambulatory Visit (INDEPENDENT_AMBULATORY_CARE_PROVIDER_SITE_OTHER): Payer: Medicare Other | Admitting: Cardiology

## 2015-11-10 ENCOUNTER — Encounter: Payer: Self-pay | Admitting: Cardiology

## 2015-11-10 ENCOUNTER — Ambulatory Visit: Payer: Medicare Other | Admitting: Cardiology

## 2015-11-10 DIAGNOSIS — I5043 Acute on chronic combined systolic (congestive) and diastolic (congestive) heart failure: Secondary | ICD-10-CM

## 2015-11-10 DIAGNOSIS — N183 Chronic kidney disease, stage 3 unspecified: Secondary | ICD-10-CM | POA: Insufficient documentation

## 2015-11-10 MED ORDER — FUROSEMIDE 80 MG PO TABS: 80.0000 mg | ORAL_TABLET | Freq: Every day | ORAL | Status: DC

## 2015-11-10 NOTE — Progress Notes (Signed)
11/10/2015 Angela Franco   11/15/1929  [id number]  Primary Physician Reymundo Poll, MD Primary Cardiologist: Dr Debara Pickett  HPI:  81 y/o female, lives at [facility]. She has a history of NICM, her EF was 30-35% at cath in 2013, 40-45% by echo April 2016. She has CAF with LBBB. She is on Eliquis. She has CRI and has seen Dr Arty Baumgartner. She had been on 3 x week lasix. She saw Dr Debara Pickett 11/02/15 and was in CHF. He increased her Lasix to 80 mg BID x 5 days, then put her on Lasix 80 mg daily. Her son accompanied her. She is in the office today for follow up. She feels much better, no SOB or edema.  Her Bun/ SCr -39/1.69 which is up slightly from 10/25/15.    Current Outpatient Prescriptions  Medication Sig Dispense Refill  . potassium chloride SA (K-DUR,KLOR-CON) 20 MEQ tablet Take 20 mEq by mouth daily.    [name] apixaban (ELIQUIS) 2.5 MG TABS tablet Take 2.5 mg by mouth 2 (two) times daily.    . calcium-vitamin D (OSCAL WITH D) 500-200 MG-UNIT tablet Take 1 tablet by mouth daily with breakfast.    . carvedilol (COREG) 6.25 MG tablet Take 1 tablet (6.25 mg total) by mouth 2 (two) times daily with a meal. 60 tablet 0  . docusate sodium 100 MG CAPS Take 200 mg by mouth at bedtime as needed. 60 capsule 0  . fentaNYL (DURAGESIC - DOSED MCG/HR) 25 MCG/HR patch Place 1 patch (25 mcg total) onto the skin every 3 (three) days. 11 patch 0  . ferrous sulfate 325 (65 FE) MG tablet Take 325 mg by mouth daily with breakfast.    . furosemide (LASIX) 80 MG tablet Take 1 tablet (80 mg total) by mouth daily. 30 tablet 11  . gabapentin (NEURONTIN) 600 MG tablet Take 1 tablet (600 mg total) by mouth 3 (three) times daily. Please dose at 8am, 2pm and 8pm 270 tablet 3  . OXcarbazepine (TRILEPTAL) 150 MG tablet Half tablets twice a day,  may increase to one tablets twice a day in one week (Patient taking differently: Half tablets three times a day,  may increase to one tablets twice a day in one week) 60 tablet 3  .  oxyCODONE (OXY IR/ROXICODONE) 5 MG immediate release tablet 1/2 tab po as needed q8 hours 90 tablet 0  . simvastatin (ZOCOR) 10 MG tablet Take 5 mg by mouth daily at 6 PM.     . temazepam (RESTORIL) 15 MG capsule Take 15 mg by mouth at bedtime.     [name] tiotropium (SPIRIVA) 18 MCG inhalation capsule Place 18 mcg into inhaler and inhale daily.     No current facility-administered medications for this visit.    Allergies  Allergen Reactions  . Penicillins Hives    Social History   Social History  . Marital Status: Widowed    Spouse Name: N/A  . Number of Children: 3  . Years of Education: 12   Occupational History  . Not on file.   Social History Main Topics  . Smoking status: Never Smoker   . Smokeless tobacco: Never Used  . Alcohol Use: No  . Drug Use: No  . Sexual Activity: Not on file   Other Topics Concern  . Not on file   Social History Narrative   Patient is widowed.    Patient has 3 children.    Patient is retired.      Review of Systems: General:  negative for chills, fever, night sweats or weight changes.  Cardiovascular: negative for chest pain, dyspnea on exertion, edema, orthopnea, palpitations, paroxysmal nocturnal dyspnea or shortness of breath Dermatological: negative for rash Respiratory: negative for cough or wheezing Urologic: negative for hematuria Abdominal: negative for nausea, vomiting, diarrhea, bright red blood per rectum, melena, or hematemesis Neurologic: negative for visual changes, syncope, or dizziness All other systems reviewed and are otherwise negative except as noted above.    Blood pressure 120/76, pulse 71, height 5\' 3"  (1.6 m), weight 141 lb (63.957 kg).  General appearance: alert, cooperative and no distress Neck: no carotid bruit and no JVD Lungs: few scattered crackles Heart: irregular Extremities: no edema Neurologic: Grossly normal  EKG AF LBBB  ASSESSMENT AND PLAN:   Systolic and diastolic CHF, acute on chronic,    Wgt down 9 lbs and improvement in symptoms since LOV.  Chronic renal impairment, stage 3 (moderate) Slight bump in SCr with diuresis  Atrial fibrillation, chronic Rate controlled  Non-ischemic cardiomyopathy EF improved from 30-35% to 40-45% by Echo in 07/2012 EF 30-35% by cath 07/04/12, 40-45% by echo April 2016  Chronic anticoagulation Eliquis  Normal coronary arteries August 2013 .    PLAN  I decreased her K+ to 20 meq daily. She should continue Lasix 80 mg daily for now. I suggested she make an appointment with Dr Arty Baumgartner for follow up. I did order a BMP in 2 weeks. She can see dr Debara Pickett in three months.   [name] PA-C 11/10/2015 11:15 AM

## 2015-11-10 NOTE — Patient Instructions (Addendum)
Medication Instructions:  DECREASE YOUR POTASSIUM TO ONE A DAY   Labwork: BMET DOWNSTAIRS IN 2 WEEKS  Testing/Procedures: NONE  Follow-Up: Your physician recommends that you schedule a follow-up appointment in: DR HILTY IN 3 MONTHS  If you need a refill on your cardiac medications before your next appointment, please call your pharmacy.

## 2015-11-10 NOTE — Assessment & Plan Note (Signed)
Rate controlled 

## 2015-11-10 NOTE — Assessment & Plan Note (Signed)
Wgt down 9 lbs and improvement in symptoms since LOV.

## 2015-11-10 NOTE — Assessment & Plan Note (Signed)
Slight bump in SCr with diuresis

## 2015-11-10 NOTE — Assessment & Plan Note (Signed)
Eliquis 

## 2015-11-10 NOTE — Assessment & Plan Note (Signed)
EF 30-35% by cath 07/04/12, 40-45% by echo April 2016

## 2015-11-16 ENCOUNTER — Other Ambulatory Visit: Payer: Self-pay | Admitting: Internal Medicine

## 2015-11-16 MED ORDER — APIXABAN 2.5 MG PO TABS: 2.5000 mg | ORAL_TABLET | Freq: Two times a day (BID) | ORAL | Status: DC

## 2015-11-16 NOTE — Telephone Encounter (Signed)
°*  STAT* If patient is at the pharmacy, call can be transferred to refill team.   1. Which medications need to be refilled? (please list name of each medication and dose if known) Eliquis 2.5mg   2. Which pharmacy/location (including street and city if local pharmacy) is medication to be sent to?[facility]   3. Do they need a 30 day or 90 day supply? [place]

## 2015-11-16 NOTE — Telephone Encounter (Signed)
Sent refill into patients pharmacy.  

## 2015-11-24 ENCOUNTER — Telehealth: Payer: Self-pay | Admitting: Internal Medicine

## 2015-11-24 MED ORDER — SIMVASTATIN 10 MG PO TABS: 5.0000 mg | ORAL_TABLET | Freq: Every day | ORAL | Status: DC

## 2015-11-24 NOTE — Telephone Encounter (Signed)
°*  STAT* If patient is at the pharmacy, call can be transferred to refill team.   1. Which medications need to be refilled? (please list name of each medication and dose if known) Simvastatin 10mg    2. Which pharmacy/location (including street and city if local pharmacy) is medication to be sent to? [facility]   3. Do they need a 30 day or 90 day supply? [place]

## 2015-12-08 ENCOUNTER — Other Ambulatory Visit: Payer: Self-pay | Admitting: Internal Medicine

## 2015-12-08 MED ORDER — CARVEDILOL 6.25 MG PO TABS: 6.2500 mg | ORAL_TABLET | Freq: Two times a day (BID) | ORAL | Status: DC

## 2015-12-08 NOTE — Telephone Encounter (Signed)
°*  STAT* If patient is at the pharmacy, call can be transferred to refill team.   1. Which medications need to be refilled? (please list name of each medication and dose if known) Carvedilol 6.25mg  (BID)  2. Which pharmacy/location (including street and city if local pharmacy) is medication to be sent to? [facility]   3. Do they need a 30 day or 90 day supply? [place]

## 2015-12-10 ENCOUNTER — Ambulatory Visit: Payer: Medicare Other

## 2015-12-10 ENCOUNTER — Emergency Department (HOSPITAL_COMMUNITY): Payer: Medicare Other

## 2015-12-10 ENCOUNTER — Emergency Department (HOSPITAL_COMMUNITY)
Admission: EM | Admit: 2015-12-10 | Discharge: 2015-12-10 | Disposition: A | Discharge status: Home / Self Care | Payer: Medicare Other | Attending: Emergency Medicine | Admitting: Emergency Medicine

## 2015-12-10 ENCOUNTER — Encounter (HOSPITAL_COMMUNITY): Payer: Self-pay

## 2015-12-10 DIAGNOSIS — M8580 Other specified disorders of bone density and structure, unspecified site: Secondary | ICD-10-CM | POA: Insufficient documentation

## 2015-12-10 DIAGNOSIS — Z880 Allergy status to penicillin: Secondary | ICD-10-CM | POA: Insufficient documentation

## 2015-12-10 DIAGNOSIS — I4891 Unspecified atrial fibrillation: Secondary | ICD-10-CM | POA: Diagnosis not present

## 2015-12-10 DIAGNOSIS — G4700 Insomnia, unspecified: Secondary | ICD-10-CM | POA: Insufficient documentation

## 2015-12-10 DIAGNOSIS — D649 Anemia, unspecified: Secondary | ICD-10-CM | POA: Diagnosis not present

## 2015-12-10 DIAGNOSIS — J449 Chronic obstructive pulmonary disease, unspecified: Secondary | ICD-10-CM | POA: Insufficient documentation

## 2015-12-10 DIAGNOSIS — I129 Hypertensive chronic kidney disease with stage 1 through stage 4 chronic kidney disease, or unspecified chronic kidney disease: Secondary | ICD-10-CM | POA: Diagnosis not present

## 2015-12-10 DIAGNOSIS — L03012 Cellulitis of left finger: Secondary | ICD-10-CM | POA: Diagnosis not present

## 2015-12-10 DIAGNOSIS — Z87440 Personal history of urinary (tract) infections: Secondary | ICD-10-CM | POA: Insufficient documentation

## 2015-12-10 DIAGNOSIS — N183 Chronic kidney disease, stage 3 (moderate): Secondary | ICD-10-CM | POA: Insufficient documentation

## 2015-12-10 DIAGNOSIS — Z9889 Other specified postprocedural states: Secondary | ICD-10-CM | POA: Diagnosis not present

## 2015-12-10 DIAGNOSIS — E785 Hyperlipidemia, unspecified: Secondary | ICD-10-CM | POA: Diagnosis not present

## 2015-12-10 DIAGNOSIS — G500 Trigeminal neuralgia: Secondary | ICD-10-CM | POA: Diagnosis not present

## 2015-12-10 DIAGNOSIS — I509 Heart failure, unspecified: Secondary | ICD-10-CM | POA: Diagnosis not present

## 2015-12-10 DIAGNOSIS — Z79899 Other long term (current) drug therapy: Secondary | ICD-10-CM | POA: Diagnosis not present

## 2015-12-10 DIAGNOSIS — Z7902 Long term (current) use of antithrombotics/antiplatelets: Secondary | ICD-10-CM | POA: Diagnosis not present

## 2015-12-10 MED ORDER — DOXYCYCLINE HYCLATE 100 MG PO CAPS: 100.0000 mg | ORAL_CAPSULE | Freq: Two times a day (BID) | ORAL | Status: DC

## 2015-12-10 NOTE — ED Provider Notes (Signed)
CSN: [id number]     Arrival date & time 12/10/15  1414 History   First MD Initiated Contact with Patient 12/10/15 1507     Chief Complaint  Patient presents with  . Hand Pain  . Cellulitis     (Consider location/radiation/quality/duration/timing/severity/associated sxs/prior Treatment) Patient is a 82 y.o. female presenting with hand pain. The history is provided by the patient. No language interpreter was used.  Hand Pain This is a new problem. The current episode started today (3 days). The problem occurs constantly. The problem has been unchanged. Pertinent negatives include no numbness or weakness. The symptoms are aggravated by bending. She has tried nothing for the symptoms. The treatment provided no relief.  Pt reports she was separating 2 sweaters 2 days ago and had pain in her finger.  Pt reports increased redness and swelling since.  Painful to touch now.  Pt had cellulitis in the past and this feels the same   Past Medical History  Diagnosis Date  . COPD (chronic obstructive pulmonary disease) ([place])     Never smoked. Questionable diagnosis.  . Atrial fibrillation ([place])   . Osteopenia   . Insomnia   . Hyponatremia   . Hypertension   . UTI (lower urinary tract infection)   . Bursitis of hip   . Trigeminal neuralgia   . Altered mental status 08/24/2012  . Anemia, with significant drop in H/H 08/24/2012  . CHF (congestive heart failure) ([place])   . Heart disease   . Acute facial pain   . LBBB (left bundle branch block)   . Nonischemic cardiomyopathy ([place])   . Dyslipidemia   . Kidney failure     Stage 3   Past Surgical History  Procedure Laterality Date  . Appendectomy    . Tonsillectomy and adenoidectomy    . Cardiac catheterization  07/04/2012    normal coronaries, EF 30-35%, elevated R & L heart pressures, reduced CO, mild pulm HTN (Dr. K. Mali Hilty)  . Transthoracic echocardiogram  2013    mild conc LVH, RV mildly dilated; LA severely dilated; RA mod dilated;  mild-mod mitral annular calcif, calcif of anterior & posterior MV leaflet, mod MR; mod-severe TR with RSVP 30-67mmHg; mild calcif of AV leaflets and mod aortic regurg; mild pulm valve regurg; pericardial effusion (right)  . Nm myocar perf wall motion  2011    lexiscan myoview - normal LV systolic function w/normal wall motion, no evidence of scar/ischemia  . Left and right heart catheterization with coronary angiogram N/A 07/04/2012    Procedure: LEFT AND RIGHT HEART CATHETERIZATION WITH CORONARY ANGIOGRAM;  Surgeon: Pixie Casino, MD;  Location: [facility] CATH LAB;  Service: Cardiovascular;  Laterality: N/A;   Family History  Problem Relation Age of Onset  . Stroke Father   . Heart attack Father   . Heart attack Brother   . Heart attack Brother   . Alzheimer's disease Mother    Social History  Substance Use Topics  . Smoking status: Never Smoker   . Smokeless tobacco: Never Used  . Alcohol Use: No   OB History    No data available     Review of Systems  Skin: Positive for color change.       erythema  Neurological: Negative for weakness and numbness.  All other systems reviewed and are negative.     Allergies  Penicillins  Home Medications   Prior to Admission medications   Medication Sig Start Date End Date Taking? Authorizing Provider  apixaban (  ELIQUIS) 2.5 MG TABS tablet Take 1 tablet (2.5 mg total) by mouth 2 (two) times daily. 11/16/15   Pixie Casino, MD  calcium-vitamin D (OSCAL WITH D) 500-200 MG-UNIT tablet Take 1 tablet by mouth daily with breakfast.    Historical Provider, MD  carvedilol (COREG) 6.25 MG tablet Take 1 tablet (6.25 mg total) by mouth 2 (two) times daily with a meal. 12/08/15   Pixie Casino, MD  docusate sodium 100 MG CAPS Take 200 mg by mouth at bedtime as needed. 08/26/12   Orson Eva, MD  fentaNYL (DURAGESIC - DOSED MCG/HR) 25 MCG/HR patch Place 1 patch (25 mcg total) onto the skin every 3 (three) days. 10/26/15   Marcial Pacas, MD  ferrous sulfate  325 (65 FE) MG tablet Take 325 mg by mouth daily with breakfast.    Historical Provider, MD  furosemide (LASIX) 80 MG tablet Take 1 tablet (80 mg total) by mouth daily. 11/10/15 09/21/18  Erlene Quan, PA-C  gabapentin (NEURONTIN) 600 MG tablet Take 1 tablet (600 mg total) by mouth 3 (three) times daily. Please dose at 8am, 2pm and 8pm 06/14/15   Marcial Pacas, MD  OXcarbazepine (TRILEPTAL) 150 MG tablet Half tablets twice a day,  may increase to one tablets twice a day in one week Patient taking differently: Half tablets three times a day,  may increase to one tablets twice a day in one week 10/13/15   Marcial Pacas, MD  oxyCODONE (OXY IR/ROXICODONE) 5 MG immediate release tablet 1/2 tab po as needed q8 hours 10/26/15   Marcial Pacas, MD  potassium chloride SA (K-DUR,KLOR-CON) 20 MEQ tablet Take 20 mEq by mouth daily.    Historical Provider, MD  simvastatin (ZOCOR) 10 MG tablet Take 0.5 tablets (5 mg total) by mouth daily at 6 PM. 11/24/15   Pixie Casino, MD  temazepam (RESTORIL) 15 MG capsule Take 15 mg by mouth at bedtime.     Historical Provider, MD  tiotropium (SPIRIVA) 18 MCG inhalation capsule Place 18 mcg into inhaler and inhale daily.    Historical Provider, MD   BP 143/92 mmHg  Pulse 65  Temp(Src) 97.7 F (36.5 C) (Oral)  Resp 16  SpO2 88% Physical Exam  Constitutional: She is oriented to person, place, and time. She appears well-developed and well-nourished.  HENT:  Head: Normocephalic.  Eyes: EOM are normal.  Neck: Normal range of motion.  Pulmonary/Chest: Effort normal.  Abdominal: She exhibits no distension.  Musculoskeletal: She exhibits tenderness.  Tender erythematous distal left ring finger,  nv and ns intact  Neurological: She is alert and oriented to person, place, and time.  Psychiatric: She has a normal mood and affect.  Nursing note and vitals reviewed.   ED Course  .Marland KitchenIncision and Drainage Date/Time: 12/10/2015 4:36 PM Performed by: Fransico Meadow Authorized by: Fransico Meadow Consent: Verbal consent obtained. Patient identity confirmed: verbally with patient Time out: Immediately prior to procedure a "time out" was called to verify the correct patient, procedure, equipment, support staff and site/side marked as required. Type: abscess Body area: upper extremity Location details: right ring finger Needle gauge: 18 Incision type: single straight Complexity: simple Drainage: purulent Drainage amount: scant Wound treatment: wound left open Patient tolerance: Patient tolerated the procedure well with no immediate complications   (including critical care time) Labs Review Labs Reviewed - No data to display  Imaging Review Dg Finger Ring Left  12/10/2015  CLINICAL DATA:  Pain, redness and swelling of the left  ring finger for 3 days. No known injury. Initial encounter. EXAM: LEFT RING FINGER 2+V COMPARISON:  None. FINDINGS: No acute bony or joint abnormality is identified. Soft tissues about the left ring finger are mildly swollen. Degenerative change at the DIP joint is noted. There is also degenerative disease at the DIP joint of the left long finger. IMPRESSION: Mild appearing soft tissue swelling without acute bony or joint abnormality. Degenerative disease DIP joint left ring and long fingers. Electronically Signed   By: Inge Rise M.D.   On: 12/10/2015 15:51   I have personally reviewed and evaluated these images and lab results as part of my medical decision-making.   EKG Interpretation None      MDM   Final diagnoses:  Cellulitis of ring finger, left        Fransico Meadow, PA-C 12/10/15 [place], [place] 12/10/15 [place], MD 12/11/15 1248

## 2015-12-10 NOTE — ED Notes (Signed)
PA at bedside.

## 2015-12-10 NOTE — ED Notes (Signed)
Pt c/o increasing R ring finger redness and swelling x 3 days.  Pain score 5/10.  Redness noted below nail bed.  Pt was found to have 89% O2 Sat.  Hx of COPD.  Pt reports that she only wants to be seen for finger swelling.

## 2015-12-10 NOTE — Discharge Instructions (Signed)
Fingertip Infection When an infection is around the nail, it is called a paronychia. When it appears over the tip of the finger, it is called a felon. These infections are due to minor injuries or cracks in the skin. If they are not treated properly, they can lead to bone infection and permanent damage to the fingernail. Incision and drainage is necessary if a pus pocket (an abscess) has formed. Antibiotics and pain medicine may also be needed. Keep your hand elevated for the next 2-3 days to reduce swelling and pain. If a pack was placed in the abscess, it should be removed in 1-2 days by your caregiver. Soak the finger in warm water for 20 minutes 4 times daily to help promote drainage. Keep the hands as dry as possible. Wear protective gloves with cotton liners. See your caregiver for follow-up care as recommended.  HOME CARE INSTRUCTIONS   Keep wound clean, dry and dressed as suggested by your caregiver.  Soak in warm salt water for fifteen minutes, four times per day for bacterial infections.  Your caregiver will prescribe an antibiotic if a bacterial infection is suspected. Take antibiotics as directed and finish the prescription, even if the problem appears to be improving before the medicine is gone.  Only take over-the-counter or prescription medicines for pain, discomfort, or fever as directed by your caregiver. SEEK IMMEDIATE MEDICAL CARE IF:  There is redness, swelling, or increasing pain in the wound.  Pus or any other unusual drainage is coming from the wound.  An unexplained oral temperature above 102 F (38.9 C) develops.  You notice a foul smell coming from the wound or dressing. MAKE SURE YOU:   Understand these instructions.  Monitor your condition.  Contact your caregiver if you are getting worse or not improving.   This information is not intended to replace advice given to you by your health care provider. Make sure you discuss any questions you have with your  health care provider.   Document Released: 12/28/2004 Document Revised: 02/12/2012 Document Reviewed: 05/10/2015 Elsevier Interactive Patient Education 2016 [employer] Inc[employer]  

## 2015-12-12 ENCOUNTER — Emergency Department (HOSPITAL_BASED_OUTPATIENT_CLINIC_OR_DEPARTMENT_OTHER)
Admission: EM | Admit: 2015-12-12 | Discharge: 2015-12-12 | Disposition: A | Discharge status: Home / Self Care | Payer: Medicare Other | Attending: Emergency Medicine | Admitting: Emergency Medicine

## 2015-12-12 ENCOUNTER — Encounter (HOSPITAL_BASED_OUTPATIENT_CLINIC_OR_DEPARTMENT_OTHER): Payer: Self-pay

## 2015-12-12 DIAGNOSIS — Z87440 Personal history of urinary (tract) infections: Secondary | ICD-10-CM | POA: Diagnosis not present

## 2015-12-12 DIAGNOSIS — I4891 Unspecified atrial fibrillation: Secondary | ICD-10-CM | POA: Diagnosis not present

## 2015-12-12 DIAGNOSIS — Z79899 Other long term (current) drug therapy: Secondary | ICD-10-CM | POA: Diagnosis not present

## 2015-12-12 DIAGNOSIS — L02511 Cutaneous abscess of right hand: Secondary | ICD-10-CM | POA: Diagnosis not present

## 2015-12-12 DIAGNOSIS — G4700 Insomnia, unspecified: Secondary | ICD-10-CM | POA: Diagnosis not present

## 2015-12-12 DIAGNOSIS — Z9889 Other specified postprocedural states: Secondary | ICD-10-CM | POA: Diagnosis not present

## 2015-12-12 DIAGNOSIS — D649 Anemia, unspecified: Secondary | ICD-10-CM | POA: Insufficient documentation

## 2015-12-12 DIAGNOSIS — I509 Heart failure, unspecified: Secondary | ICD-10-CM | POA: Insufficient documentation

## 2015-12-12 DIAGNOSIS — E785 Hyperlipidemia, unspecified: Secondary | ICD-10-CM | POA: Insufficient documentation

## 2015-12-12 DIAGNOSIS — N183 Chronic kidney disease, stage 3 (moderate): Secondary | ICD-10-CM | POA: Insufficient documentation

## 2015-12-12 DIAGNOSIS — L089 Local infection of the skin and subcutaneous tissue, unspecified: Secondary | ICD-10-CM | POA: Diagnosis present

## 2015-12-12 DIAGNOSIS — Z880 Allergy status to penicillin: Secondary | ICD-10-CM | POA: Insufficient documentation

## 2015-12-12 DIAGNOSIS — Z8739 Personal history of other diseases of the musculoskeletal system and connective tissue: Secondary | ICD-10-CM | POA: Insufficient documentation

## 2015-12-12 DIAGNOSIS — J449 Chronic obstructive pulmonary disease, unspecified: Secondary | ICD-10-CM | POA: Diagnosis not present

## 2015-12-12 DIAGNOSIS — I129 Hypertensive chronic kidney disease with stage 1 through stage 4 chronic kidney disease, or unspecified chronic kidney disease: Secondary | ICD-10-CM | POA: Diagnosis not present

## 2015-12-12 MED ORDER — LIDOCAINE-EPINEPHRINE 1 %-1:100000 IJ SOLN
20.0000 mL | Freq: Once | INTRAMUSCULAR | Status: AC
  Administered 2015-12-12: 1 mL via INTRADERMAL
  Filled 2015-12-12: qty 1

## 2015-12-12 NOTE — Discharge Instructions (Signed)
Incision and Drainage Incision and drainage is a procedure in which a sac-like structure (cystic structure) is opened and drained. The area to be drained usually contains material such as pus, fluid, or blood.  LET YOUR CAREGIVER KNOW ABOUT:   Allergies to medicine.  Medicines taken, including vitamins, herbs, eyedrops, over-the-counter medicines, and creams.  Use of steroids (by mouth or creams).  Previous problems with anesthetics or numbing medicines.  History of bleeding problems or blood clots.  Previous surgery.  Other health problems, including diabetes and kidney problems.  Possibility of pregnancy, if this applies. RISKS AND COMPLICATIONS  Pain.  Bleeding.  Scarring.  Infection. BEFORE THE PROCEDURE  You may need to have an ultrasound or other imaging tests to see how large or deep your cystic structure is. Blood tests may also be used to determine if you have an infection or how severe the infection is. You may need to have a tetanus shot. PROCEDURE  The affected area is cleaned with a cleaning fluid. The cyst area will then be numbed with a medicine (local anesthetic). A small incision will be made in the cystic structure. A syringe or catheter may be used to drain the contents of the cystic structure, or the contents may be squeezed out. The area will then be flushed with a cleansing solution. After cleansing the area, it is often gently packed with a gauze or another wound dressing. Once it is packed, it will be covered with gauze and tape or some other type of wound dressing. AFTER THE PROCEDURE   Often, you will be allowed to go home right after the procedure.  You may be given antibiotic medicine to prevent or heal an infection.  If the area was packed with gauze or some other wound dressing, you will likely need to come back in 1 to 2 days to get it removed.  The area should heal in about 14 days.   This information is not intended to replace advice given  to you by your health care provider. Make sure you discuss any questions you have with your health care provider.   Document Released: 05/16/2001 Document Revised: 05/21/2012 Document Reviewed: 01/15/2012 Elsevier Interactive Patient Education 2016 Elsevier Inc.  

## 2015-12-12 NOTE — ED Provider Notes (Signed)
CSN: [id number]     Arrival date & time 12/12/15  1325 History   First MD Initiated Contact with Patient 12/12/15 1503     Chief Complaint  Patient presents with  . finger infection      (Consider location/radiation/quality/duration/timing/severity/associated sxs/prior Treatment) Patient is a 82 y.o. female presenting with hand pain. The history is provided by the patient.  Hand Pain This is a new problem. The current episode started more than 1 week ago. The problem occurs constantly. The problem has been gradually improving. Pertinent negatives include no chest pain, no abdominal pain, no headaches and no shortness of breath. Nothing aggravates the symptoms. Nothing relieves the symptoms. She has tried nothing for the symptoms. The treatment provided no relief.   82 yo F with a chief complaint of the left ring finger abscess. This been going on for about 3 or 4 days. Patient was seen at [facility] had a dry I&D. Patient started on doxycycline. Since then the erythema has improved however the patient's wound is now come to ahead and she is concerned that is getting worse. Denies fevers or chills denies nausea or vomiting.     Past Medical History  Diagnosis Date  . COPD (chronic obstructive pulmonary disease) ([place])     Never smoked. Questionable diagnosis.  . Atrial fibrillation ([place])   . Osteopenia   . Insomnia   . Hyponatremia   . Hypertension   . UTI (lower urinary tract infection)   . Bursitis of hip   . Trigeminal neuralgia   . Altered mental status 08/24/2012  . Anemia, with significant drop in H/H 08/24/2012  . CHF (congestive heart failure) ([place])   . Heart disease   . Acute facial pain   . LBBB (left bundle branch block)   . Nonischemic cardiomyopathy ([place])   . Dyslipidemia   . Kidney failure     Stage 3   Past Surgical History  Procedure Laterality Date  . Appendectomy    . Tonsillectomy and adenoidectomy    . Cardiac catheterization  07/04/2012    normal  coronaries, EF 30-35%, elevated R & L heart pressures, reduced CO, mild pulm HTN (Dr. K. Mali Hilty)  . Transthoracic echocardiogram  2013    mild conc LVH, RV mildly dilated; LA severely dilated; RA mod dilated; mild-mod mitral annular calcif, calcif of anterior & posterior MV leaflet, mod MR; mod-severe TR with RSVP 30-24mmHg; mild calcif of AV leaflets and mod aortic regurg; mild pulm valve regurg; pericardial effusion (right)  . Nm myocar perf wall motion  2011    lexiscan myoview - normal LV systolic function w/normal wall motion, no evidence of scar/ischemia  . Left and right heart catheterization with coronary angiogram N/A 07/04/2012    Procedure: LEFT AND RIGHT HEART CATHETERIZATION WITH CORONARY ANGIOGRAM;  Surgeon: Pixie Casino, MD;  Location: [facility] CATH LAB;  Service: Cardiovascular;  Laterality: N/A;   Family History  Problem Relation Age of Onset  . Stroke Father   . Heart attack Father   . Heart attack Brother   . Heart attack Brother   . Alzheimer's disease Mother    Social History  Substance Use Topics  . Smoking status: Never Smoker   . Smokeless tobacco: Never Used  . Alcohol Use: No   OB History    No data available     Review of Systems  Constitutional: Negative for fever and chills.  HENT: Negative for congestion and rhinorrhea.   Eyes: Negative for redness  and visual disturbance.  Respiratory: Negative for shortness of breath and wheezing.   Cardiovascular: Negative for chest pain and palpitations.  Gastrointestinal: Negative for nausea, vomiting, abdominal pain and rectal pain.  Genitourinary: Negative for dysuria and urgency.  Musculoskeletal: Negative for myalgias and arthralgias.  Skin: Positive for wound. Negative for pallor.  Neurological: Negative for dizziness and headaches.      Allergies  Penicillins  Home Medications   Prior to Admission medications   Medication Sig Start Date End Date Taking? Authorizing Provider  apixaban (ELIQUIS)  2.5 MG TABS tablet Take 1 tablet (2.5 mg total) by mouth 2 (two) times daily. 11/16/15   Pixie Casino, MD  calcium-vitamin D (OSCAL WITH D) 500-200 MG-UNIT tablet Take 1 tablet by mouth daily with breakfast.    Historical Provider, MD  carvedilol (COREG) 6.25 MG tablet Take 1 tablet (6.25 mg total) by mouth 2 (two) times daily with a meal. 12/08/15   Pixie Casino, MD  docusate sodium 100 MG CAPS Take 200 mg by mouth at bedtime as needed. 08/26/12   Orson Eva, MD  doxycycline (VIBRAMYCIN) 100 MG capsule Take 1 capsule (100 mg total) by mouth 2 (two) times daily. 12/10/15   Fransico Meadow, PA-C  fentaNYL (DURAGESIC - DOSED MCG/HR) 25 MCG/HR patch Place 1 patch (25 mcg total) onto the skin every 3 (three) days. Patient not taking: Reported on 12/10/2015 10/26/15   Marcial Pacas, MD  ferrous sulfate 325 (65 FE) MG tablet Take 325 mg by mouth daily with breakfast.    Historical Provider, MD  furosemide (LASIX) 80 MG tablet Take 1 tablet (80 mg total) by mouth daily. 11/10/15 09/21/18  Erlene Quan, PA-C  gabapentin (NEURONTIN) 600 MG tablet Take 1 tablet (600 mg total) by mouth 3 (three) times daily. Please dose at 8am, 2pm and 8pm 06/14/15   Marcial Pacas, MD  OXcarbazepine (TRILEPTAL) 150 MG tablet Half tablets twice a day,  may increase to one tablets twice a day in one week Patient taking differently: Take 75 mg by mouth 3 (three) times daily. Take 1/2 tablet (75 mg) at 0600, 1200, 1800. 10/13/15   Marcial Pacas, MD  oxyCODONE (OXY IR/ROXICODONE) 5 MG immediate release tablet 1/2 tab po as needed q8 hours Patient taking differently: Take 2.5-5 mg by mouth 3 (three) times daily. Take 1/2 tablet (2.5 mg) in the am, at lunch and Take 1 tablet (5 mg) at bedtime. 10/26/15   Marcial Pacas, MD  potassium chloride SA (K-DUR,KLOR-CON) 20 MEQ tablet Take 20 mEq by mouth daily.    Historical Provider, MD  simvastatin (ZOCOR) 10 MG tablet Take 0.5 tablets (5 mg total) by mouth daily at 6 PM. 11/24/15   Pixie Casino, MD   temazepam (RESTORIL) 15 MG capsule Take 15 mg by mouth at bedtime.     Historical Provider, MD  tiotropium (SPIRIVA) 18 MCG inhalation capsule Place 18 mcg into inhaler and inhale daily.    Historical Provider, MD   BP 127/75 mmHg  Pulse 72  Temp(Src) 97.6 F (36.4 C) (Oral)  Resp 20  Ht 5\' 3"  (1.6 m)  Wt 135 lb (61.236 kg)  BMI 23.92 kg/m2  SpO2 93% Physical Exam  Constitutional: She is oriented to person, place, and time. She appears well-developed and well-nourished. No distress.  HENT:  Head: Normocephalic and atraumatic.  Eyes: EOM are normal. Pupils are equal, round, and reactive to light.  Neck: Normal range of motion. Neck supple.  Cardiovascular: Normal rate and  regular rhythm.  Exam reveals no gallop and no friction rub.   No murmur heard. Pulmonary/Chest: Effort normal. She has no wheezes. She has no rales.  Abdominal: Soft. She exhibits no distension. There is no tenderness. There is no rebound.  Musculoskeletal: She exhibits edema and tenderness.  Erythema mostly distal to the DIP with head formation.  Neurological: She is alert and oriented to person, place, and time.  Skin: Skin is warm and dry. She is not diaphoretic.  Psychiatric: She has a normal mood and affect. Her behavior is normal.  Nursing note and vitals reviewed.   ED Course  .Marland KitchenIncision and Drainage Date/Time: 12/12/2015 4:08 PM Performed by: Tyrone Nine Ruth Kovich Authorized by: Deno Etienne Consent: Verbal consent not obtained. Written consent not obtained. Risks and benefits: risks, benefits and alternatives were discussed Consent given by: patient Required items: required blood products, implants, devices, and special equipment available Time out: Immediately prior to procedure a "time out" was called to verify the correct patient, procedure, equipment, support staff and site/side marked as required. Type: abscess Body area: upper extremity Location details: left ring finger Anesthesia: digital block Local  anesthetic: lidocaine 1% with epinephrine Anesthetic total: 5 ml Patient sedated: no Scalpel size: 11 Incision type: single straight Complexity: complex Drainage: purulent Drainage amount: copious Wound treatment: wound left open Patient tolerance: Patient tolerated the procedure well with no immediate complications Comments: Significant amount of purulent drainage loculations broken up with hemostat.   (including critical care time) Labs Review Labs Reviewed - No data to display  Imaging Review No results found. I have personally reviewed and evaluated these images and lab results as part of my medical decision-making.   EKG Interpretation None      MDM   Final diagnoses:  Abscess of finger of right hand    82 yo F with a chief complaint of a ring finger abscess. This was drained at bedside. With improvement of erythema or however continue doxycycline. Wound recheck in 2 days. Continue warm compresses.  4:10 PM:  I have discussed the diagnosis/risks/treatment options with the patient and family and believe the pt to be eligible for discharge home to follow-up with PCP. We also discussed returning to the ED immediately if new or worsening sx occur. We discussed the sx which are most concerning (e.g., sudden worsening pain, fever, inability to tolerate by mouth) that necessitate immediate return. Medications administered to the patient during their visit and any new prescriptions provided to the patient are listed below.  Medications given during this visit Medications  lidocaine-EPINEPHrine (XYLOCAINE W/EPI) 1 %-1:100000 (with pres) injection 20 mL (1 mL Intradermal Given by Other 12/12/15 1541)    New Prescriptions   No medications on file    The patient appears reasonably screen and/or stabilized for discharge and I doubt any other medical condition or other [facility] requiring further screening, evaluation, or treatment in the ED at this time prior to discharge.      Deno Etienne, DO 12/12/15 1610

## 2015-12-12 NOTE — ED Notes (Signed)
Patient here for further evaluation of left ring finger redness, swelling and pain. Today developed blister to same. Seen at [facility] on Friday and started on antibiotic for finger infection

## 2015-12-29 ENCOUNTER — Emergency Department (HOSPITAL_COMMUNITY): Payer: Medicare Other

## 2015-12-29 ENCOUNTER — Observation Stay (HOSPITAL_COMMUNITY)
Admission: EM | Admit: 2015-12-29 | Discharge: 2015-12-31 | Disposition: A | Discharge status: Nursing Home (Includes ICF) | Payer: Medicare Other | Attending: Internal Medicine | Admitting: Internal Medicine

## 2015-12-29 ENCOUNTER — Encounter (HOSPITAL_COMMUNITY): Payer: Self-pay | Admitting: *Deleted

## 2015-12-29 DIAGNOSIS — E871 Hypo-osmolality and hyponatremia: Secondary | ICD-10-CM

## 2015-12-29 DIAGNOSIS — Z66 Do not resuscitate: Secondary | ICD-10-CM | POA: Diagnosis not present

## 2015-12-29 DIAGNOSIS — N183 Chronic kidney disease, stage 3 (moderate): Secondary | ICD-10-CM | POA: Diagnosis not present

## 2015-12-29 DIAGNOSIS — J449 Chronic obstructive pulmonary disease, unspecified: Secondary | ICD-10-CM | POA: Insufficient documentation

## 2015-12-29 DIAGNOSIS — E785 Hyperlipidemia, unspecified: Secondary | ICD-10-CM | POA: Insufficient documentation

## 2015-12-29 DIAGNOSIS — R197 Diarrhea, unspecified: Secondary | ICD-10-CM | POA: Insufficient documentation

## 2015-12-29 DIAGNOSIS — Z7901 Long term (current) use of anticoagulants: Secondary | ICD-10-CM | POA: Diagnosis not present

## 2015-12-29 DIAGNOSIS — I130 Hypertensive heart and chronic kidney disease with heart failure and stage 1 through stage 4 chronic kidney disease, or unspecified chronic kidney disease: Secondary | ICD-10-CM | POA: Diagnosis not present

## 2015-12-29 DIAGNOSIS — J09X2 Influenza due to identified novel influenza A virus with other respiratory manifestations: Secondary | ICD-10-CM | POA: Diagnosis not present

## 2015-12-29 DIAGNOSIS — G500 Trigeminal neuralgia: Secondary | ICD-10-CM | POA: Diagnosis not present

## 2015-12-29 DIAGNOSIS — R0902 Hypoxemia: Secondary | ICD-10-CM | POA: Diagnosis not present

## 2015-12-29 DIAGNOSIS — I428 Other cardiomyopathies: Secondary | ICD-10-CM | POA: Insufficient documentation

## 2015-12-29 DIAGNOSIS — I509 Heart failure, unspecified: Secondary | ICD-10-CM | POA: Diagnosis not present

## 2015-12-29 DIAGNOSIS — Z79899 Other long term (current) drug therapy: Secondary | ICD-10-CM | POA: Insufficient documentation

## 2015-12-29 DIAGNOSIS — D509 Iron deficiency anemia, unspecified: Secondary | ICD-10-CM | POA: Insufficient documentation

## 2015-12-29 DIAGNOSIS — R0602 Shortness of breath: Secondary | ICD-10-CM | POA: Diagnosis present

## 2015-12-29 DIAGNOSIS — R52 Pain, unspecified: Secondary | ICD-10-CM

## 2015-12-29 DIAGNOSIS — I4891 Unspecified atrial fibrillation: Secondary | ICD-10-CM | POA: Diagnosis not present

## 2015-12-29 LAB — INFLUENZA PANEL BY PCR (TYPE A & B)
H1N1 flu by pcr: NOT DETECTED
INFLAPCR: POSITIVE — AB
Influenza B By PCR: NEGATIVE

## 2015-12-29 LAB — BASIC METABOLIC PANEL WITH GFR
Anion gap: 12 (ref 5–15)
BUN: 41 mg/dL — ABNORMAL HIGH (ref 6–20)
CO2: 29 mmol/L (ref 22–32)
Calcium: 8.8 mg/dL — ABNORMAL LOW (ref 8.9–10.3)
Chloride: 88 mmol/L — ABNORMAL LOW (ref 101–111)
Creatinine, Ser: 1.72 mg/dL — ABNORMAL HIGH (ref 0.44–1.00)
GFR calc Af Amer: 30 mL/min — ABNORMAL LOW
GFR calc non Af Amer: 26 mL/min — ABNORMAL LOW
Glucose, Bld: 103 mg/dL — ABNORMAL HIGH (ref 65–99)
Potassium: 3.9 mmol/L (ref 3.5–5.1)
Sodium: 129 mmol/L — ABNORMAL LOW (ref 135–145)

## 2015-12-29 LAB — CBC WITH DIFFERENTIAL/PLATELET
Basophils Absolute: 0 K/uL (ref 0.0–0.1)
Basophils Relative: 0 %
Eosinophils Absolute: 0.1 K/uL (ref 0.0–0.7)
Eosinophils Relative: 2 %
HCT: 29.9 % — ABNORMAL LOW (ref 36.0–46.0)
Hemoglobin: 9.9 g/dL — ABNORMAL LOW (ref 12.0–15.0)
Lymphocytes Relative: 34 %
Lymphs Abs: 1.4 K/uL (ref 0.7–4.0)
MCH: 31.4 pg (ref 26.0–34.0)
MCHC: 33.1 g/dL (ref 30.0–36.0)
MCV: 94.9 fL (ref 78.0–100.0)
Monocytes Absolute: 0.9 K/uL (ref 0.1–1.0)
Monocytes Relative: 21 %
Neutro Abs: 1.8 K/uL (ref 1.7–7.7)
Neutrophils Relative %: 43 %
Platelets: 138 K/uL — ABNORMAL LOW (ref 150–400)
RBC: 3.15 MIL/uL — ABNORMAL LOW (ref 3.87–5.11)
RDW: 14.8 % (ref 11.5–15.5)
WBC: 4.1 K/uL (ref 4.0–10.5)

## 2015-12-29 LAB — I-STAT ARTERIAL BLOOD GAS, ED
Acid-Base Excess: 4 mmol/L — ABNORMAL HIGH (ref 0.0–2.0)
Bicarbonate: 29.8 meq/L — ABNORMAL HIGH (ref 20.0–24.0)
O2 Saturation: 99 %
TCO2: 31 mmol/L (ref 0–100)
pCO2 arterial: 47.2 mmHg — ABNORMAL HIGH (ref 35.0–45.0)
pH, Arterial: 7.408 (ref 7.350–7.450)
pO2, Arterial: 159 mmHg — ABNORMAL HIGH (ref 80.0–100.0)

## 2015-12-29 LAB — PROCALCITONIN: Procalcitonin: 0.16 ng/mL

## 2015-12-29 LAB — I-STAT TROPONIN, ED: Troponin i, poc: 0.02 ng/mL (ref 0.00–0.08)

## 2015-12-29 LAB — STREP PNEUMONIAE URINARY ANTIGEN: STREP PNEUMO URINARY ANTIGEN: NEGATIVE

## 2015-12-29 LAB — SODIUM, URINE, RANDOM: Sodium, Ur: <10 mmol/L

## 2015-12-29 LAB — I-STAT CG4 LACTIC ACID, ED: Lactic Acid, Venous: 1.42 mmol/L (ref 0.5–2.0)

## 2015-12-29 LAB — MRSA PCR SCREENING: MRSA BY PCR: NEGATIVE

## 2015-12-29 LAB — OSMOLALITY, URINE: OSMOLALITY UR: 193 mosm/kg — AB (ref 300–900)

## 2015-12-29 LAB — BRAIN NATRIURETIC PEPTIDE: B Natriuretic Peptide: 131.3 pg/mL — ABNORMAL HIGH (ref 0.0–100.0)

## 2015-12-29 MED ORDER — PREDNISONE 20 MG PO TABS
40.0000 mg | ORAL_TABLET | Freq: Every day | ORAL | Status: DC
  Administered 2015-12-30 – 2015-12-31 (×2): 40 mg via ORAL
  Filled 2015-12-29 (×2): qty 2

## 2015-12-29 MED ORDER — ALBUTEROL (5 MG/ML) CONTINUOUS INHALATION SOLN
10.0000 mg/h | INHALATION_SOLUTION | Freq: Once | RESPIRATORY_TRACT | Status: AC
  Administered 2015-12-29: 10 mg/h via RESPIRATORY_TRACT
  Filled 2015-12-29: qty 20

## 2015-12-29 MED ORDER — GABAPENTIN 300 MG PO CAPS
600.0000 mg | ORAL_CAPSULE | Freq: Once | ORAL | Status: AC
  Administered 2015-12-29: 600 mg via ORAL
  Filled 2015-12-29: qty 2

## 2015-12-29 MED ORDER — CARVEDILOL 6.25 MG PO TABS
6.2500 mg | ORAL_TABLET | Freq: Two times a day (BID) | ORAL | Status: DC
  Administered 2015-12-30 – 2015-12-31 (×3): 6.25 mg via ORAL
  Filled 2015-12-29 (×3): qty 1

## 2015-12-29 MED ORDER — BACITRACIN-NEOMYCIN-POLYMYXIN OINTMENT TUBE: TOPICAL_OINTMENT | Freq: Two times a day (BID) | CUTANEOUS | Status: DC

## 2015-12-29 MED ORDER — GABAPENTIN 600 MG PO TABS
600.0000 mg | ORAL_TABLET | Freq: Three times a day (TID) | ORAL | Status: DC
  Administered 2015-12-29 – 2015-12-31 (×5): 600 mg via ORAL
  Filled 2015-12-29 (×5): qty 1

## 2015-12-29 MED ORDER — SIMVASTATIN 10 MG PO TABS
5.0000 mg | ORAL_TABLET | Freq: Every day | ORAL | Status: DC
  Administered 2015-12-29 – 2015-12-30 (×2): 5 mg via ORAL
  Filled 2015-12-29 (×3): qty 1

## 2015-12-29 MED ORDER — AZITHROMYCIN 250 MG PO TABS
250.0000 mg | ORAL_TABLET | Freq: Every day | ORAL | Status: AC
  Administered 2015-12-30 – 2015-12-31 (×2): 250 mg via ORAL
  Filled 2015-12-29 (×2): qty 1

## 2015-12-29 MED ORDER — FERROUS SULFATE 325 (65 FE) MG PO TABS
325.0000 mg | ORAL_TABLET | Freq: Every day | ORAL | Status: DC
  Administered 2015-12-30 – 2015-12-31 (×2): 325 mg via ORAL
  Filled 2015-12-29 (×2): qty 1

## 2015-12-29 MED ORDER — APIXABAN 2.5 MG PO TABS
2.5000 mg | ORAL_TABLET | Freq: Two times a day (BID) | ORAL | Status: DC
  Administered 2015-12-29 – 2015-12-31 (×4): 2.5 mg via ORAL
  Filled 2015-12-29 (×4): qty 1

## 2015-12-29 MED ORDER — OXCARBAZEPINE 150 MG PO TABS
75.0000 mg | ORAL_TABLET | Freq: Three times a day (TID) | ORAL | Status: DC
  Administered 2015-12-29 – 2015-12-31 (×5): 75 mg via ORAL
  Filled 2015-12-29 (×12): qty 0.5

## 2015-12-29 MED ORDER — AZITHROMYCIN 250 MG PO TABS: 250.0000 mg | ORAL_TABLET | Freq: Every day | ORAL | Status: DC

## 2015-12-29 MED ORDER — MAGNESIUM SULFATE 2 GM/50ML IV SOLN
2.0000 g | Freq: Once | INTRAVENOUS | Status: AC
  Administered 2015-12-29: 2 g via INTRAVENOUS
  Filled 2015-12-29: qty 50

## 2015-12-29 MED ORDER — TEMAZEPAM 15 MG PO CAPS
15.0000 mg | ORAL_CAPSULE | Freq: Every day | ORAL | Status: DC
  Administered 2015-12-29 – 2015-12-30 (×2): 15 mg via ORAL
  Filled 2015-12-29 (×2): qty 1

## 2015-12-29 MED ORDER — POTASSIUM CHLORIDE CRYS ER 20 MEQ PO TBCR
20.0000 meq | EXTENDED_RELEASE_TABLET | Freq: Every day | ORAL | Status: DC
  Administered 2015-12-30 – 2015-12-31 (×2): 20 meq via ORAL
  Filled 2015-12-29 (×2): qty 1

## 2015-12-29 MED ORDER — DEXAMETHASONE SODIUM PHOSPHATE 10 MG/ML IJ SOLN
10.0000 mg | Freq: Once | INTRAMUSCULAR | Status: AC
  Administered 2015-12-29: 10 mg via INTRAVENOUS
  Filled 2015-12-29: qty 1

## 2015-12-29 MED ORDER — FUROSEMIDE 80 MG PO TABS
80.0000 mg | ORAL_TABLET | Freq: Every day | ORAL | Status: DC
  Administered 2015-12-30 – 2015-12-31 (×2): 80 mg via ORAL
  Filled 2015-12-29 (×2): qty 1

## 2015-12-29 MED ORDER — IPRATROPIUM-ALBUTEROL 0.5-2.5 (3) MG/3ML IN SOLN
3.0000 mL | RESPIRATORY_TRACT | Status: DC
  Administered 2015-12-29 – 2015-12-30 (×5): 3 mL via RESPIRATORY_TRACT
  Filled 2015-12-29 (×5): qty 3

## 2015-12-29 MED ORDER — OXYCODONE HCL 5 MG PO TABS: 2.5000 mg | ORAL_TABLET | Freq: Two times a day (BID) | ORAL | Status: DC | PRN

## 2015-12-29 MED ORDER — BACITRACIN-NEOMYCIN-POLYMYXIN OINTMENT TUBE
TOPICAL_OINTMENT | Freq: Two times a day (BID) | CUTANEOUS | Status: DC
  Administered 2015-12-29 – 2015-12-31 (×4): via TOPICAL
  Filled 2015-12-29 (×3): qty 15

## 2015-12-29 NOTE — H&P (Signed)
Date: 12/29/2015               Patient Name:  Angela Franco MRN: [id number]  DOB: [birth year] Age / Sex: 82 y.o., female   PCP: Reymundo Poll, MD, Marylynn Pearson, MD         Medical Service: Internal Medicine Teaching Service         Attending Physician: Dr. Aldine Contes, MD    First Contact: Dr. Liberty Handy Pager: [phone]  Second Contact: Dr. Julious Oka Pager: [phone]       After Hours (After 5p/  First Contact Pager: [phone]  weekends / holidays): Second Contact Pager: [phone]   Chief Complaint: Dyspnea  History of Present Illness:  Angela Franco is a 82 year old woman with a past medical history of atrial fibrillation (CHADS-VASC 5 on Eliquis), non-ischemic cardiomyopathy (CHF EF 40-45/% 03/2015), CKD Stage 3, HTN, chronic LBBB, trigeminal neuralgia, and questionable COPD (no PFTs on file) who presents with shortness of breath and wheezing. She was joined by her daughter, Angela Franco, who assisted with the history. Her symptoms started on 1/21 with a productive cough (rusty sputum) and had developed sinus congestion by 1/22. On Monday 1/23 she had some chills and a recorded temperature of 101F. Her PCP had come to her assisted living facility [facility]) and prescribed azithromycin. She had felt better but by this morning, her wheezing had worsened and she felt increasingly short of breath. She has also been using her oxygen almost constantly. She typically only uses it once every two months. She has had multiple sick contacts in her ALF, with one close friend recently passing from a pneumonia. She also had a fall earlier this week from feeling "loopy" after being given cough medicine with dextromethorpan, resulting in a bruise on her back. She has had no episodes since. Her CHF has been well controlled, noted to have an exacerbation in 10/2015 and her Lasix dose was increased to her present dose. She is currently at her dry weight of 136 lbs. She denies any orthopnea, but  says she wakes up in the middle of the night short of breath recently. She has ongoing hyponatremia, thought to be due to the Trileptal for her trigeminal neuralgia. She recorded three episodes of loose stools after she had begum to taper down her oxycodone dose. She has had multiple episodes of cellulitis on her legs and hands since 08/2015, treated with multiple courses of antibiotics, the most recent being doxycycline. Otherwise, she denies headache, new one-sided weakness, confusion, blackouts, chest pain, recent weight gain, leg swelling, nausea, vomiting, constipation, abdominal pain, joint pain, or night sweats. She has never smoked and has not been consistently exposed to second hand smoke since she was 32. She denies any alcohol or drug use. She currently lives at [facility].   Meds: Current Facility-Administered Medications  Medication Dose Route Frequency Provider Last Rate Last Dose  . neomycin-bacitracin-polymyxin (NEOSPORIN) ointment   Topical BID Liberty Handy, MD       Current Outpatient Prescriptions  Medication Sig Dispense Refill  . apixaban (ELIQUIS) 2.5 MG TABS tablet Take 1 tablet (2.5 mg total) by mouth 2 (two) times daily. 60 tablet 11  . azithromycin (ZITHROMAX) 250 MG tablet Take 250-500 mg by mouth daily. Take 500 mg by mouth day 1. Take 250 mg by mouth day 2-5    . calcium-vitamin D (OSCAL WITH D) 500-200 MG-UNIT tablet Take 1 tablet by mouth daily with breakfast.    . carvedilol (  COREG) 6.25 MG tablet Take 1 tablet (6.25 mg total) by mouth 2 (two) times daily with a meal. 60 tablet 3  . docusate sodium 100 MG CAPS Take 200 mg by mouth at bedtime as needed. (Patient taking differently: Take 100 mg by mouth at bedtime as needed (constipation). ) 60 capsule 0  . ferrous sulfate 325 (65 FE) MG tablet Take 325 mg by mouth daily with breakfast.    . furosemide (LASIX) 80 MG tablet Take 1 tablet (80 mg total) by mouth daily. 30 tablet 11  . gabapentin (NEURONTIN) 600 MG  tablet Take 1 tablet (600 mg total) by mouth 3 (three) times daily. Please dose at 8am, 2pm and 8pm 270 tablet 3  . OXcarbazepine (TRILEPTAL) 150 MG tablet Half tablets twice a day,  may increase to one tablets twice a day in one week (Patient taking differently: Take 75 mg by mouth 3 (three) times daily. Take 1/2 tablet (75 mg) at 0600, 1200, 1800.) 60 tablet 3  . oxyCODONE (OXY IR/ROXICODONE) 5 MG immediate release tablet 1/2 tab po as needed q8 hours (Patient taking differently: Take 2.5-5 mg by mouth 3 (three) times daily. Take 1/2 tablet (2.5 mg) in the am, at lunch and Take 1 tablet (5 mg) at bedtime.) 90 tablet 0  . potassium chloride SA (K-DUR,KLOR-CON) 20 MEQ tablet Take 20 mEq by mouth daily.    . simvastatin (ZOCOR) 10 MG tablet Take 0.5 tablets (5 mg total) by mouth daily at 6 PM. 45 tablet 3  . temazepam (RESTORIL) 15 MG capsule Take 15 mg by mouth at bedtime.     [name] doxycycline (VIBRAMYCIN) 100 MG capsule Take 1 capsule (100 mg total) by mouth 2 (two) times daily. (Patient not taking: Reported on 12/29/2015) 20 capsule 0  . fentaNYL (DURAGESIC - DOSED MCG/HR) 25 MCG/HR patch Place 1 patch (25 mcg total) onto the skin every 3 (three) days. (Patient not taking: Reported on 12/10/2015) 11 patch 0    Allergies: Allergies as of 12/29/2015 - Review Complete 12/29/2015  Allergen Reaction Noted  . Penicillins Hives 06/28/2012   Past Medical History  Diagnosis Date  . COPD (chronic obstructive pulmonary disease) ([place])     Never smoked. Questionable diagnosis.  . Atrial fibrillation ([place])   . Osteopenia   . Insomnia   . Hyponatremia   . Hypertension   . UTI (lower urinary tract infection)   . Bursitis of hip   . Trigeminal neuralgia   . Altered mental status 08/24/2012  . Anemia, with significant drop in H/H 08/24/2012  . CHF (congestive heart failure) ([place])   . Heart disease   . Acute facial pain   . LBBB (left bundle branch block)   . Nonischemic cardiomyopathy ([place])   .  Dyslipidemia   . Kidney failure     Stage 3   Past Surgical History  Procedure Laterality Date  . Appendectomy    . Tonsillectomy and adenoidectomy    . Cardiac catheterization  07/04/2012    normal coronaries, EF 30-35%, elevated R & L heart pressures, reduced CO, mild pulm HTN (Dr. K. Mali Hilty)  . Transthoracic echocardiogram  2013    mild conc LVH, RV mildly dilated; LA severely dilated; RA mod dilated; mild-mod mitral annular calcif, calcif of anterior & posterior MV leaflet, mod MR; mod-severe TR with RSVP 30-71mmHg; mild calcif of AV leaflets and mod aortic regurg; mild pulm valve regurg; pericardial effusion (right)  . Nm myocar perf wall motion  2011  lexiscan myoview - normal LV systolic function w/normal wall motion, no evidence of scar/ischemia  . Left and right heart catheterization with coronary angiogram N/A 07/04/2012    Procedure: LEFT AND RIGHT HEART CATHETERIZATION WITH CORONARY ANGIOGRAM;  Surgeon: Pixie Casino, MD;  Location: [facility] CATH LAB;  Service: Cardiovascular;  Laterality: N/A;   Family History  Problem Relation Age of Onset  . Stroke Father   . Heart attack Father   . Heart attack Brother   . Heart attack Brother   . Alzheimer's disease Mother    Social History   Social History  . Marital Status: Widowed    Spouse Name: N/A  . Number of Children: 3  . Years of Education: 12   Occupational History  . Not on file.   Social History Main Topics  . Smoking status: Never Smoker   . Smokeless tobacco: Never Used  . Alcohol Use: No  . Drug Use: No  . Sexual Activity: Not on file   Other Topics Concern  . Not on file   Social History Narrative   Patient is widowed.    Patient has 3 children.    Patient is retired.     Review of Systems: All systems negative except per HPI.   Physical Exam: Blood pressure 139/70, pulse 73, temperature 99.2 F (37.3 C), temperature source Rectal, resp. rate 21, height 5\' 3"  (1.6 m), weight 136 lb (61.689 kg),  SpO2 95 %. General: Lying in bed, NAD HEENT: NCAT, EOMI, PERRL, Sclerae ancicteric, moist mucous membranes, mild tonsillar erythema without exudate, no JVD, no cervical adenopathy Cardiovascular: irregularly irregular rhythm, regular rate. No murmurs, rubs, or gallops Pulmonary: Rhonchorous sounds and wheezes in all fields, even heard anteriorly. No audible crackles.  Abdominal: Soft. NTND. Normal BS Back: Bruising noted on upper right back Extremities: Trace edema. No clubbing or cyanosis Skin: Scarring in lower legs with bruising. Warm and dry Neurological: AAOx3. 5/5 strength in all extremities. Normal FTN. Tongue midline, face symmetric.  Psychiatric: Normal behavior and affect.  Lab results: Basic Metabolic Panel:  Recent Labs  12/29/15 1100  NA 129*  K 3.9  CL 88*  CO2 29  GLUCOSE 103*  BUN 41*  CREATININE 1.72*  CALCIUM 8.8*   Liver Function Tests: No results for input(s): AST, ALT, ALKPHOS, BILITOT, PROT, ALBUMIN in the last 72 hours. No results for input(s): LIPASE, AMYLASE in the last 72 hours. No results for input(s): AMMONIA in the last 72 hours. CBC:  Recent Labs  12/29/15 1100  WBC 4.1  NEUTROABS 1.8  HGB 9.9*  HCT 29.9*  MCV 94.9  PLT 138*     Imaging results:  Dg Ribs Unilateral Right  12/29/2015  CLINICAL DATA:  Fall today. Right rib pain and bruising. Initial encounter. EXAM: RIGHT RIBS - 2 VIEW COMPARISON:  None. FINDINGS: No acute right-sided rib fractures identified. No other bone lesions identified. Prominent anterior costochondral calcification noted. No pneumothorax or hemothorax visualized. IMPRESSION: No acute right rib fractures identified. Electronically Signed   By: Earle Gell M.D.   On: 12/29/2015 14:02   Dg Chest Portable 1 View  12/29/2015  CLINICAL DATA:  Cough, congestion EXAM: PORTABLE CHEST 1 VIEW COMPARISON:  08/23/2012 FINDINGS: There is cardiomegaly with vascular congestion. Mild interstitial prominence could reflect  interstitial edema. There is hyperinflation of the lungs compatible with COPD. No effusions or acute bony abnormality. IMPRESSION: COPD. Question mild interstitial edema/ CHF. Electronically Signed   By: Rolm Baptise M.D.   On:  12/29/2015 11:58    EKG: Atrial Fibrillation, LBBB, not significantly changed from prior.  Assessment & Plan by Problem:  Dyspnea in setting of questionable COPD: Etiology is most likely an infectious pneumonia or bronchitis, suggesting more likely a viral or bronchitic etiology given reassuring chest x-ray. Unclear if this represents a COPD exacerbation given poorly documented history and no prior tobacco abuse, although lungs are hyperinflated on chest x-ray. She has significant sick contacts. Given hyponatremia and facility-wide infection at ALF, legionella is a consideration. CHF exacerbation seems unlikely given she is at her dry weight, lacks, significant edema, and BNP is normal.  - Finish Azithromycin course 250 mg daily (final dose 1/27) - Prednisone 40 mg daily - Duonebs q4h,  - Ur legionella, strep pending - Blood culture - Flu pending - O2 therapy - Procalcitonin pending - Blood culture pending  Atrial Fibrillation (CHADS-VASc 5): Currently in Afib on exam and telemetry. - Eliquis 2.5 mg BID - Carvedilol 6.25 mg BID  CHF (EF 35-40%): Low suspicion for CHF exacerbation at this time, as above. - Furosemide 80 mg daily - Daily weights - Strict I's O's - K-dur 20 mEq daily - CBC in AM  Diarrhea: Likely related to opioid taper, but in setting of multiple courses of antibiotics, high index of suspicion for C. Diff - C. Diff pending  Hyponatremia: Currently 129, but has been as low as 128 at office visits. Could be related to Trileptal for Trigeminal neuralgia. Another consideration is legionella in the setting of her respiratory symptoms.  - Waynette Buttery pending  CKD Stage 3: Creatinine near baseline of ~1.7. - BMET in AM  Trigeminal Neuralgia: -  Trileptal 75 mg TID - Gabapenting 600 mg TID - Oxycodone 2.5 mg BID - Temazepam 15 mg QHS  Iron Deficiency Anemia: Hgb 9.9 - Ferrous sulfate 325 mg daily  HTN: 139/70  Code Status: DNR  DVT Prophylaxis: On Eliquis  Dispo: Disposition is deferred at this time, awaiting improvement of current medical problems. Anticipated discharge in approximately 2-3 day(s).   The patient does have a current PCP Reymundo Poll, MD) and does not need an [facility] hospital follow-up appointment after discharge.  The patient does have transportation limitations that hinder transportation to clinic appointments.  Signed: Liberty Handy, MD 12/29/2015, 3:29 PM

## 2015-12-29 NOTE — ED Provider Notes (Signed)
CSN: [id number]     Arrival date & time 12/29/15  1029 History   First MD Initiated Contact with Patient 12/29/15 1041     Chief Complaint  Patient presents with  . Shortness of Breath     (Consider location/radiation/quality/duration/timing/severity/associated sxs/prior Treatment) HPI   Angela Franco is an 82 y.o F with a past medical history of COPD, A. fib on Eliquis, CHF, nonischemic cardiomyopathy, CK D, HTN who presents to the emergency department today with shortness breath. Level V caveat, condition of patient. Patient's daughter provides most of the history. Patient lives at a nursing facility and approximately 5 days ago began having cough and congestion-like symptoms. On Monday she was diagnosed with bronchitis and was initiated on a Z-Pak. Patient reports subjective fevers. Patient also reports that she fell at her nursing facility 1 week ago and has bruising to her right upper back. Per daughter patient seemed to be doing better until last night when she became increasingly short of breath and had audible wheezes. Patient sent to the ED today from her nursing facility for further evaluation. Denies chest pain, dizziness, syncope.  Past Medical History  Diagnosis Date  . COPD (chronic obstructive pulmonary disease) ([place])     Never smoked. Questionable diagnosis.  . Atrial fibrillation ([place])   . Osteopenia   . Insomnia   . Hyponatremia   . Hypertension   . UTI (lower urinary tract infection)   . Bursitis of hip   . Trigeminal neuralgia   . Altered mental status 08/24/2012  . Anemia, with significant drop in H/H 08/24/2012  . CHF (congestive heart failure) ([place])   . Heart disease   . Acute facial pain   . LBBB (left bundle branch block)   . Nonischemic cardiomyopathy ([place])   . Dyslipidemia   . Kidney failure     Stage 3   Past Surgical History  Procedure Laterality Date  . Appendectomy    . Tonsillectomy and adenoidectomy    . Cardiac catheterization  07/04/2012   normal coronaries, EF 30-35%, elevated R & L heart pressures, reduced CO, mild pulm HTN (Dr. K. Mali Hilty)  . Transthoracic echocardiogram  2013    mild conc LVH, RV mildly dilated; LA severely dilated; RA mod dilated; mild-mod mitral annular calcif, calcif of anterior & posterior MV leaflet, mod MR; mod-severe TR with RSVP 30-71mmHg; mild calcif of AV leaflets and mod aortic regurg; mild pulm valve regurg; pericardial effusion (right)  . Nm myocar perf wall motion  2011    lexiscan myoview - normal LV systolic function w/normal wall motion, no evidence of scar/ischemia  . Left and right heart catheterization with coronary angiogram N/A 07/04/2012    Procedure: LEFT AND RIGHT HEART CATHETERIZATION WITH CORONARY ANGIOGRAM;  Surgeon: Pixie Casino, MD;  Location: [facility] CATH LAB;  Service: Cardiovascular;  Laterality: N/A;   Family History  Problem Relation Age of Onset  . Stroke Father   . Heart attack Father   . Heart attack Brother   . Heart attack Brother   . Alzheimer's disease Mother    Social History  Substance Use Topics  . Smoking status: Never Smoker   . Smokeless tobacco: Never Used  . Alcohol Use: No   OB History    No data available     Review of Systems  All other systems reviewed and are negative.     Allergies  Penicillins  Home Medications   Prior to Admission medications   Medication Sig Start Date  End Date Taking? Authorizing Provider  apixaban (ELIQUIS) 2.5 MG TABS tablet Take 1 tablet (2.5 mg total) by mouth 2 (two) times daily. 11/16/15   Pixie Casino, MD  calcium-vitamin D (OSCAL WITH D) 500-200 MG-UNIT tablet Take 1 tablet by mouth daily with breakfast.    Historical Provider, MD  carvedilol (COREG) 6.25 MG tablet Take 1 tablet (6.25 mg total) by mouth 2 (two) times daily with a meal. 12/08/15   Pixie Casino, MD  docusate sodium 100 MG CAPS Take 200 mg by mouth at bedtime as needed. 08/26/12   Orson Eva, MD  doxycycline (VIBRAMYCIN) 100 MG capsule  Take 1 capsule (100 mg total) by mouth 2 (two) times daily. 12/10/15   Fransico Meadow, PA-C  fentaNYL (DURAGESIC - DOSED MCG/HR) 25 MCG/HR patch Place 1 patch (25 mcg total) onto the skin every 3 (three) days. Patient not taking: Reported on 12/10/2015 10/26/15   Marcial Pacas, MD  ferrous sulfate 325 (65 FE) MG tablet Take 325 mg by mouth daily with breakfast.    Historical Provider, MD  furosemide (LASIX) 80 MG tablet Take 1 tablet (80 mg total) by mouth daily. 11/10/15 09/21/18  Erlene Quan, PA-C  gabapentin (NEURONTIN) 600 MG tablet Take 1 tablet (600 mg total) by mouth 3 (three) times daily. Please dose at 8am, 2pm and 8pm 06/14/15   Marcial Pacas, MD  OXcarbazepine (TRILEPTAL) 150 MG tablet Half tablets twice a day,  may increase to one tablets twice a day in one week Patient taking differently: Take 75 mg by mouth 3 (three) times daily. Take 1/2 tablet (75 mg) at 0600, 1200, 1800. 10/13/15   Marcial Pacas, MD  oxyCODONE (OXY IR/ROXICODONE) 5 MG immediate release tablet 1/2 tab po as needed q8 hours Patient taking differently: Take 2.5-5 mg by mouth 3 (three) times daily. Take 1/2 tablet (2.5 mg) in the am, at lunch and Take 1 tablet (5 mg) at bedtime. 10/26/15   Marcial Pacas, MD  potassium chloride SA (K-DUR,KLOR-CON) 20 MEQ tablet Take 20 mEq by mouth daily.    Historical Provider, MD  simvastatin (ZOCOR) 10 MG tablet Take 0.5 tablets (5 mg total) by mouth daily at 6 PM. 11/24/15   Pixie Casino, MD  temazepam (RESTORIL) 15 MG capsule Take 15 mg by mouth at bedtime.     Historical Provider, MD  tiotropium (SPIRIVA) 18 MCG inhalation capsule Place 18 mcg into inhaler and inhale daily.    Historical Provider, MD   Pulse 82  Temp(Src) 99.2 F (37.3 C) (Rectal)  Resp 28  Ht 5\' 3"  (1.6 m)  Wt 61.689 kg  BMI 24.10 kg/m2  SpO2 100% Physical Exam  Constitutional: She is oriented to person, place, and time. She appears well-developed and well-nourished. No distress.  HENT:  Head: Normocephalic and atraumatic.   Mouth/Throat: Oropharynx is clear and moist. No oropharyngeal exudate.  Eyes: Conjunctivae and EOM are normal. Pupils are equal, round, and reactive to light. Right eye exhibits no discharge. Left eye exhibits no discharge. No scleral icterus.  Neck: Normal range of motion. Neck supple. No JVD present.  Cardiovascular: Normal rate, regular rhythm, normal heart sounds and intact distal pulses.  Exam reveals no gallop and no friction rub.   No murmur heard. Pulmonary/Chest: No accessory muscle usage or stridor. Tachypnea noted. She has no decreased breath sounds. She has wheezes in the right upper field, the right lower field, the left upper field and the left lower field. She has rhonchi in  the right upper field, the right lower field, the left upper field and the left lower field. She has rales in the right upper field, the right lower field, the left upper field and the left lower field. She exhibits no tenderness.  Pt hypoxic, 80% on RA. Placed on 4L nasal cannula.  Abdominal: Soft. She exhibits no distension. There is no tenderness. There is no guarding.  Musculoskeletal: Normal range of motion. She exhibits no edema.  Lymphadenopathy:    She has no cervical adenopathy.  Neurological: She is alert and oriented to person, place, and time.  Strength 5/5 throughout. No sensory deficits.    Skin: Skin is warm and dry. No rash noted. She is not diaphoretic. No erythema. No pallor.  Psychiatric: She has a normal mood and affect. Her behavior is normal.  Nursing note and vitals reviewed.   ED Course  Procedures (including critical care time) Labs Review Labs Reviewed  BASIC METABOLIC PANEL - Abnormal; Notable for the following:    Sodium 129 (*)    Chloride 88 (*)    Glucose, Bld 103 (*)    BUN 41 (*)    Creatinine, Ser 1.72 (*)    Calcium 8.8 (*)    GFR calc non Af Amer 26 (*)    GFR calc Af Amer 30 (*)    All other components within normal limits  CBC WITH DIFFERENTIAL/PLATELET -  Abnormal; Notable for the following:    RBC 3.15 (*)    Hemoglobin 9.9 (*)    HCT 29.9 (*)    Platelets 138 (*)    All other components within normal limits  BRAIN NATRIURETIC PEPTIDE - Abnormal; Notable for the following:    B Natriuretic Peptide 131.3 (*)    All other components within normal limits  I-STAT ARTERIAL BLOOD GAS, ED - Abnormal; Notable for the following:    pCO2 arterial 47.2 (*)    pO2, Arterial 159.0 (*)    Bicarbonate 29.8 (*)    Acid-Base Excess 4.0 (*)    All other components within normal limits  PROCALCITONIN  SODIUM, URINE, RANDOM  OSMOLALITY, URINE  I-STAT TROPOININ, ED  I-STAT CG4 LACTIC ACID, ED    Imaging Review Dg Ribs Unilateral Right  12/29/2015  CLINICAL DATA:  Fall today. Right rib pain and bruising. Initial encounter. EXAM: RIGHT RIBS - 2 VIEW COMPARISON:  None. FINDINGS: No acute right-sided rib fractures identified. No other bone lesions identified. Prominent anterior costochondral calcification noted. No pneumothorax or hemothorax visualized. IMPRESSION: No acute right rib fractures identified. Electronically Signed   By: Earle Gell M.D.   On: 12/29/2015 14:02   Dg Chest Portable 1 View  12/29/2015  CLINICAL DATA:  Cough, congestion EXAM: PORTABLE CHEST 1 VIEW COMPARISON:  08/23/2012 FINDINGS: There is cardiomegaly with vascular congestion. Mild interstitial prominence could reflect interstitial edema. There is hyperinflation of the lungs compatible with COPD. No effusions or acute bony abnormality. IMPRESSION: COPD. Question mild interstitial edema/ CHF. Electronically Signed   By: Rolm Baptise M.D.   On: 12/29/2015 11:58   I have personally reviewed and evaluated these images and lab results as part of my medical decision-making.   EKG Interpretation   Date/Time:  Wednesday December 29 2015 10:51:10 EST Ventricular Rate:  91 PR Interval:    QRS Duration: 137 QT Interval:  430 QTC Calculation: 529 R Axis:   -101 Text Interpretation:   Atrial fibrillation Nonspecific IVCD with LAD  Anterior infarct, old Abnormal T, consider ischemia, lateral leads  Baseline wander in lead(s) V1 V5 Baseline wander Confirmed by Alvino Chapel   MD, NATHAN [id number]) on 12/29/2015 11:00:39 AM      MDM   Final diagnoses:  Hyponatremia  Hypoxia    82 y.o F with a past medical history of A. fib, COPD, CHF presents to the emergency department today with increased shortness of breath. On presentation to the emergency department patient is hypoxic, saturating 80% on room air. Patient has home oxygen that she uses as needed, but is not on O2 normally. Patient placed on 4 L nasal cannula and was still only maintaining O2 sats at 88%. Patient placed on nonrebreather 15 L with significant improvement. Patient now saturating at 100%. Lungs are significantly rhonchorous in all lung fields. Patient has audible wheezing. Patient given continuous albuterol nebulizer and Decadron. Patient is afebrile and all other vital signs are stable at this time. Patient is currently taking azithromycin for bronchitis as prescribed by her PCP.  Portable CXR ordered as well as basic labs, lactate, blood cultures, ABG, troponin, BNP.   Patient's daughter now with her and reports that patient fell approximately one week ago and has bruising to her right shoulder blade. On exam there is large ecchymosis to right upper back. We will obtain x-ray. X-ray negative for fracture.  CXR reveals mild interstitial edema/CHF. No consolidation or sign of infection. No leukocytosis. BNP mildly elevated at 131.3. Patient is hyponatremic at 129. However patient is taking Trileptal which may be the cause of her hyponatremia. Troponin and lactic acid within normal limits. Mild elevation in creatinine when compared to previous, now at 1.72.  Upon reexamination after nebulizer and Decadron patient appears significantly clinically improved. Lung exam has improved as well but rhonchi and wheezes are still  present. We'll administer magnesium sulfate. Patient now only requiring 4 L O2 nasal cannula and is saturating at 95%.  Given that patient is afebrile with no leukocytosis and normal lactic acid as well as negative CXR for consolidation doubt shortness of breath is due to infectious source. Patient may be volume overloaded from CHF. However, Given continued hypoxia and oxygen requirement that is different from her baseline and hyponatremia will admit to hospitalist service.  Spoke with hospitalist to admit patient.  Patient was discussed with and seen by Dr. Alvino Chapel who agrees with the treatment plan.      Dondra Spry [place], PA-C 12/29/15 1636  Davonna Belling, MD 12/30/15 [phone]

## 2015-12-29 NOTE — ED Notes (Signed)
Pt arrives from [facility] assisted [facility] via [facility]. Pt was seen by MD at facility and was put on an antibiotic per pt, but has continued to worsen. Pt became extremely sob last night and was put on 4L O2 [place]. Per EMS, pt was on RA on arrival and was at 80%. Pt states she has had a productive cough with brown sputum. Pt is labored upon arrival and at 82% on RA. Pt placed on 4L with little improvement. Aldona Bar, [place] notified and at bedside. Pt placed on a nonrebreather and respiratory called.

## 2015-12-29 NOTE — ED Notes (Signed)
Called xray and informed them pt ready for scan.

## 2015-12-29 NOTE — ED Notes (Signed)
admitting at bedside.  

## 2015-12-29 NOTE — ED Notes (Signed)
Attempted report to [facility] ([id number]) on 3E.

## 2015-12-30 ENCOUNTER — Ambulatory Visit: Payer: Medicare Other | Admitting: Internal Medicine

## 2015-12-30 DIAGNOSIS — R0602 Shortness of breath: Secondary | ICD-10-CM | POA: Diagnosis not present

## 2015-12-30 DIAGNOSIS — R062 Wheezing: Secondary | ICD-10-CM | POA: Diagnosis not present

## 2015-12-30 DIAGNOSIS — J09X2 Influenza due to identified novel influenza A virus with other respiratory manifestations: Secondary | ICD-10-CM | POA: Diagnosis not present

## 2015-12-30 DIAGNOSIS — J111 Influenza due to unidentified influenza virus with other respiratory manifestations: Secondary | ICD-10-CM

## 2015-12-30 DIAGNOSIS — D649 Anemia, unspecified: Secondary | ICD-10-CM

## 2015-12-30 DIAGNOSIS — N183 Chronic kidney disease, stage 3 (moderate): Secondary | ICD-10-CM

## 2015-12-30 LAB — BASIC METABOLIC PANEL
ANION GAP: 10 (ref 5–15)
BUN: 36 mg/dL — ABNORMAL HIGH (ref 6–20)
CALCIUM: 8.6 mg/dL — AB (ref 8.9–10.3)
CO2: 33 mmol/L — AB (ref 22–32)
CREATININE: 1.49 mg/dL — AB (ref 0.44–1.00)
Chloride: 91 mmol/L — ABNORMAL LOW (ref 101–111)
GFR, EST AFRICAN AMERICAN: 36 mL/min — AB (ref >60)
GFR, EST NON AFRICAN AMERICAN: 31 mL/min — AB (ref >60)
Glucose, Bld: 105 mg/dL — ABNORMAL HIGH (ref 65–99)
Potassium: 4.4 mmol/L (ref 3.5–5.1)
Sodium: 134 mmol/L — ABNORMAL LOW (ref 135–145)

## 2015-12-30 LAB — CBC
HCT: 30.4 % — ABNORMAL LOW (ref 36.0–46.0)
Hemoglobin: 10.1 g/dL — ABNORMAL LOW (ref 12.0–15.0)
MCH: 31.8 pg (ref 26.0–34.0)
MCHC: 33.2 g/dL (ref 30.0–36.0)
MCV: 95.6 fL (ref 78.0–100.0)
PLATELETS: 144 10*3/uL — AB (ref 150–400)
RBC: 3.18 MIL/uL — ABNORMAL LOW (ref 3.87–5.11)
RDW: 14.8 % (ref 11.5–15.5)
WBC: 3.4 10*3/uL — AB (ref 4.0–10.5)

## 2015-12-30 MED ORDER — IPRATROPIUM-ALBUTEROL 0.5-2.5 (3) MG/3ML IN SOLN: 3.0000 mL | RESPIRATORY_TRACT | Status: DC | PRN

## 2015-12-30 MED ORDER — ALBUTEROL SULFATE HFA 108 (90 BASE) MCG/ACT IN AERS: 2.0000 | INHALATION_SPRAY | Freq: Four times a day (QID) | RESPIRATORY_TRACT | Status: DC | PRN

## 2015-12-30 MED ORDER — PREDNISONE 20 MG PO TABS: 20.0000 mg | ORAL_TABLET | Freq: Every day | ORAL | Status: DC

## 2015-12-30 MED ORDER — OSELTAMIVIR PHOSPHATE 30 MG PO CAPS
30.0000 mg | ORAL_CAPSULE | Freq: Every day | ORAL | Status: DC
  Administered 2015-12-30 (×2): 30 mg via ORAL
  Filled 2015-12-30 (×5): qty 1

## 2015-12-30 MED ORDER — OSELTAMIVIR PHOSPHATE 75 MG PO CAPS: 75.0000 mg | ORAL_CAPSULE | Freq: Two times a day (BID) | ORAL | Status: DC

## 2015-12-30 MED ORDER — OSELTAMIVIR PHOSPHATE 30 MG PO CAPS: 30.0000 mg | ORAL_CAPSULE | Freq: Every day | ORAL | Status: DC

## 2015-12-30 NOTE — Discharge Summary (Deleted)
Name: Angela Franco MRN: [id number] DOB: October 02, 1929 82 y.o. PCP: Reymundo Poll, MD  Date of Admission: 12/29/2015 10:29 AM Date of Discharge: 12/30/2015 Attending Physician: Aldine Contes, MD  Discharge Diagnosis: 1. Influenza 2. Atrial Fibrillation 3. CHF 4. CKD Stage 3 5. Trigeminal Neuralgia 6. Iron Deficiency Anemia 7. HTN  Discharge Medications:   Medication List    STOP taking these medications        doxycycline 100 MG capsule  Commonly known as:  VIBRAMYCIN     fentaNYL 25 MCG/HR patch  Commonly known as:  DURAGESIC - dosed mcg/hr      TAKE these medications        albuterol 108 (90 Base) MCG/ACT inhaler  Commonly known as:  PROVENTIL HFA;VENTOLIN HFA  Inhale 2 puffs into the lungs every 6 (six) hours as needed for wheezing or shortness of breath.     apixaban 2.5 MG Tabs tablet  Commonly known as:  ELIQUIS  Take 1 tablet (2.5 mg total) by mouth 2 (two) times daily.     azithromycin 250 MG tablet  Commonly known as:  ZITHROMAX  Take 250-500 mg by mouth daily. Take 500 mg by mouth day 1. Take 250 mg by mouth day 2-5     calcium-vitamin D 500-200 MG-UNIT tablet  Commonly known as:  OSCAL WITH D  Take 1 tablet by mouth daily with breakfast.     carvedilol 6.25 MG tablet  Commonly known as:  COREG  Take 1 tablet (6.25 mg total) by mouth 2 (two) times daily with a meal.     DSS 100 MG Caps  Take 200 mg by mouth at bedtime as needed.     ferrous sulfate 325 (65 FE) MG tablet  Take 325 mg by mouth daily with breakfast.     furosemide 80 MG tablet  Commonly known as:  LASIX  Take 1 tablet (80 mg total) by mouth daily.     gabapentin 600 MG tablet  Commonly known as:  NEURONTIN  Take 1 tablet (600 mg total) by mouth 3 (three) times daily. Please dose at 8am, 2pm and 8pm     oseltamivir 30 MG capsule  Commonly known as:  TAMIFLU  Take 1 capsule (30 mg total) by mouth at bedtime.     OXcarbazepine 150 MG tablet  Commonly known as:  TRILEPTAL    Half tablets twice a day,  may increase to one tablets twice a day in one week     oxyCODONE 5 MG immediate release tablet  Commonly known as:  Oxy IR/ROXICODONE  1/2 tab po as needed q8 hours     potassium chloride SA 20 MEQ tablet  Commonly known as:  K-DUR,KLOR-CON  Take 20 mEq by mouth daily.     predniSONE 20 MG tablet  Commonly known as:  DELTASONE  Take 1 tablet (20 mg total) by mouth daily with breakfast.     simvastatin 10 MG tablet  Commonly known as:  ZOCOR  Take 0.5 tablets (5 mg total) by mouth daily at 6 PM.     temazepam 15 MG capsule  Commonly known as:  RESTORIL  Take 15 mg by mouth at bedtime.        Disposition and follow-up:   Angela Franco was discharged from [facility] in stable condition.  At the hospital follow up visit please address:  1.  Patient has influenza. This will likely resolve on its own, and has already improved. Continue evaluate to make  sure she is not developing a superimposed bacterial pneumonia. She will complete a course of oseltamivir.  2. Patient has a history of COPD in her chart, but there are no PFTs on record. She has no smoking history.   2.  Labs / imaging needed at time of follow-up: None  3.  Pending labs/ test needing follow-up: Blood cultures, urine legionella pending at discharge.  Follow-up Appointments: Follow-up Information    Schedule an appointment as soon as possible for a visit with Reymundo Poll, MD.   Specialty:  Family Medicine   Contact information:   [place] [place] [address] [phone]       Follow up with Pixie Casino, MD. Schedule an appointment as soon as possible for a visit in 1 week.   Specialty:  Cardiology   Contact information:   [address] [place] [address] [phone]       Discharge Instructions:   Angela Franco, you were in the hospital for influenza. This will usually resolve within a week. Please drink  plenty of fluids and make sure family and visitors wear face masks when they enter your room. If you have a fever, chills, night sweats, or worsening shortness of breath, you may be developing a bacterial pneumonia. This can sometimes happen after people get flu. In this case, please seek medical attention.  Procedures Performed:  Dg Ribs Unilateral Right  12/29/2015  CLINICAL DATA:  Fall today. Right rib pain and bruising. Initial encounter. EXAM: RIGHT RIBS - 2 VIEW COMPARISON:  None. FINDINGS: No acute right-sided rib fractures identified. No other bone lesions identified. Prominent anterior costochondral calcification noted. No pneumothorax or hemothorax visualized. IMPRESSION: No acute right rib fractures identified. Electronically Signed   By: Earle Gell M.D.   On: 12/29/2015 14:02   Dg Chest Portable 1 View  12/29/2015  CLINICAL DATA:  Cough, congestion EXAM: PORTABLE CHEST 1 VIEW COMPARISON:  08/23/2012 FINDINGS: There is cardiomegaly with vascular congestion. Mild interstitial prominence could reflect interstitial edema. There is hyperinflation of the lungs compatible with COPD. No effusions or acute bony abnormality. IMPRESSION: COPD. Question mild interstitial edema/ CHF. Electronically Signed   By: Rolm Baptise M.D.   On: 12/29/2015 11:58   Dg Finger Ring Left  12/10/2015  CLINICAL DATA:  Pain, redness and swelling of the left ring finger for 3 days. No known injury. Initial encounter. EXAM: LEFT RING FINGER 2+V COMPARISON:  None. FINDINGS: No acute bony or joint abnormality is identified. Soft tissues about the left ring finger are mildly swollen. Degenerative change at the DIP joint is noted. There is also degenerative disease at the DIP joint of the left long finger. IMPRESSION: Mild appearing soft tissue swelling without acute bony or joint abnormality. Degenerative disease DIP joint left ring and long fingers. Electronically Signed   By: Inge Rise M.D.   On: 12/10/2015 15:51     Admission HPI: Angela Franco is a 82 year old woman with a past medical history of atrial fibrillation (CHADS-VASC 5 on Eliquis), non-ischemic cardiomyopathy (CHF EF 40-45/% 03/2015), CKD Stage 3, HTN, chronic LBBB, trigeminal neuralgia, and questionable COPD (no PFTs on file) who presents with shortness of breath and wheezing. She was joined by her daughter, [name], who assisted with the history. Her symptoms started on 1/21 with a productive cough (rusty sputum) and had developed sinus congestion by 1/22. On Monday 1/23 she had some chills and a recorded temperature of 101F. Her PCP  had come to her assisted living facility [facility]) and prescribed azithromycin. She had felt better but by this morning, her wheezing had worsened and she felt increasingly short of breath. She has also been using her oxygen almost constantly. She typically only uses it once every two months. She has had multiple sick contacts in her ALF, with one close friend recently passing from a pneumonia. She also had a fall earlier this week from feeling "loopy" after being given cough medicine with dextromethorpan, resulting in a bruise on her back. She has had no episodes since. Her CHF has been well controlled, noted to have an exacerbation in 10/2015 and her Lasix dose was increased to her present dose. She is currently at her dry weight of 136 lbs. She denies any orthopnea, but says she wakes up in the middle of the night short of breath recently. She has ongoing hyponatremia, thought to be due to the Trileptal for her trigeminal neuralgia. She recorded three episodes of loose stools after she had begum to taper down her oxycodone dose. She has had multiple episodes of cellulitis on her legs and hands since 08/2015, treated with multiple courses of antibiotics, the most recent being doxycycline. Otherwise, she denies headache, new one-sided weakness, confusion, blackouts, chest pain, recent weight gain, leg swelling,  nausea, vomiting, constipation, abdominal pain, joint pain, or night sweats. She has never smoked and has not been consistently exposed to second hand smoke since she was 32. She denies any alcohol or drug use. She currently lives at [facility].  Hospital Course by problem list: Active Problems:   Shortness of breath   Influenza: Her flu panel was positive for inflenza A. She was started on a 5 day regimen of oseltamivir. She was satting >95% on 2-4L [place]. She continued to sound rhonchorous and wheezy on exam, but had improved significantly on nebulized bronchodilators. She was evaluated by PT, who recommended no further services.  Hyponatremia: In high 120s on admission which improved to 134 on day of discharge. UrOsm and UNa were low, consistent with hypovolemia in the setting of acute illness.   Atrial Fibrillation (CHADS-VASc 5): Patient remained rate controlled but in a fib on Carvedilol 6.25 mg BID. She remained on Eliquis 2.5 mg BID.   CHF (EF 35-40%): There was a low suspicion for CHF exacerbation given she was at her dry weight of 135 on admission, lacked crackles on exam, and had a reassuring BNP. She was maintained on Furosemide 80 mg daily with home K-dur.  CKD Stage 3: Her creatinine was near baseline of 1.4 on discharge.  Trigeminal Neuralgia: Maintained on Trileptal 75 mg TID, Gabapenting 600 mg TID, Oxycodone 2.5 mg BID, and Temazepam 15 mg QHS  Iron Deficiency Anemia: Hgb stable in the 9's during admission. Maintained on Ferrous sulfate 325 mg daily.  Discharge Vitals:   BP 113/50 mmHg  Pulse 67  Temp(Src) 98.6 F (37 C) (Oral)  Resp 18  Ht 5\' 3"  (1.6 m)  Wt 133 lb 14.4 oz (60.737 kg)  BMI 23.73 kg/m2  SpO2 95%  Discharge Labs:  Results for orders placed or performed during the hospital encounter of 12/29/15 (from the past 24 hour(s))  Sodium, urine, random     Status: None   Collection Time: 12/29/15  4:15 PM  Result Value Ref Range   Sodium, Ur <10 mmol/L   Osmolality, urine     Status: Abnormal   Collection Time: 12/29/15  4:16 PM  Result Value Ref Range  Osmolality, Ur 193 (L) 300 - 900 mOsm/kg  Procalcitonin - Baseline     Status: None   Collection Time: 12/29/15  4:18 PM  Result Value Ref Range   Procalcitonin 0.16 ng/mL  MRSA PCR Screening     Status: None   Collection Time: 12/29/15  8:27 PM  Result Value Ref Range   MRSA by PCR NEGATIVE NEGATIVE  Influenza panel by PCR (type A & B, H1N1)     Status: Abnormal   Collection Time: 12/29/15  8:27 PM  Result Value Ref Range   Influenza A By PCR POSITIVE (A) NEGATIVE   Influenza B By PCR NEGATIVE NEGATIVE   H1N1 flu by pcr NOT DETECTED NOT DETECTED  Strep pneumoniae urinary antigen     Status: None   Collection Time: 12/29/15  9:56 PM  Result Value Ref Range   Strep Pneumo Urinary Antigen NEGATIVE NEGATIVE  Basic metabolic panel     Status: Abnormal   Collection Time: 12/30/15  4:49 AM  Result Value Ref Range   Sodium 134 (L) 135 - 145 mmol/L   Potassium 4.4 3.5 - 5.1 mmol/L   Chloride 91 (L) 101 - 111 mmol/L   CO2 33 (H) 22 - 32 mmol/L   Glucose, Bld 105 (H) 65 - 99 mg/dL   BUN 36 (H) 6 - 20 mg/dL   Creatinine, Ser 1.49 (H) 0.44 - 1.00 mg/dL   Calcium 8.6 (L) 8.9 - 10.3 mg/dL   GFR calc non Af Amer 31 (L) >60 mL/min   GFR calc Af Amer 36 (L) >60 mL/min   Anion gap 10 5 - 15  CBC     Status: Abnormal   Collection Time: 12/30/15  8:47 AM  Result Value Ref Range   WBC 3.4 (L) 4.0 - 10.5 K/uL   RBC 3.18 (L) 3.87 - 5.11 MIL/uL   Hemoglobin 10.1 (L) 12.0 - 15.0 g/dL   HCT 30.4 (L) 36.0 - 46.0 %   MCV 95.6 78.0 - 100.0 fL   MCH 31.8 26.0 - 34.0 pg   MCHC 33.2 30.0 - 36.0 g/dL   RDW 14.8 11.5 - 15.5 %   Platelets 144 (L) 150 - 400 K/uL    Signed: Liberty Handy, MD 12/30/2015, 11:49 AM

## 2015-12-30 NOTE — Evaluation (Signed)
I have read, reviewed and agree with student's note.   [place] [facility] [phone] (pager)   Physical Therapy Evaluation Patient Details Name: Angela Franco MRN: [id number] DOB: 04-17-1929 Today's Date: 12/30/2015   History of Present Illness  Angela Franco is an 82 y.o F with a past medical history of COPD, A. fib on Eliquis, CHF, nonischemic cardiomyopathy, CK D, HTN who presents to the emergency department today with shortness breath.  Clinical Impression  Pt very pleasant 82 y.o. With the above medical history, from assisted living facility. Pt report she is close to her baseline level of function although is requiring increased use of supplemental O2 and decreased activity tolerance due to SOB and cough. Pt is Mod I for all transfers but requiring Min A for ambulation for safety in hospital environment. Stood at sink for bathing for extend period of time without LOB or safety concerns. Pt has O2 and RW at home which she uses intermittently. Educated pt on safety concerns and use of RW until she gains her full strength back. Feel pt should function well in prior environment. Pt states she will return to her group exercise classes for continued strengthening. At this time will follow pt acutely and recommending D/C back to ALF with AD use.      Follow Up Recommendations No PT follow up    Equipment Recommendations  None recommended by PT    Recommendations for Other Services       Precautions / Restrictions Precautions Precautions: Fall Precaution Comments: Contact. Droplet Restrictions Weight Bearing Restrictions: No      Mobility  Bed Mobility Overal bed mobility: Modified Independent             General bed mobility comments: HOB elevated, achieves sitting EOB w/o increased time or use of railings  Transfers Overall transfer level: Modified independent Equipment used: None             General transfer comment: Sit to  stand from bed on single attempt, navigated restroom Mod I  Ambulation/Gait Ambulation/Gait assistance: Min assist Ambulation Distance (Feet): 25 Feet Assistive device: 1 person hand held assist Gait Pattern/deviations: Step-through pattern;Decreased stride length;Drifts right/left Gait velocity: decreased   General Gait Details: use of room furniture to steady pt w/o AD in hospital environment  Stairs            Wheelchair Mobility    Modified Rankin (Stroke Patients Only)       Balance Overall balance assessment: Modified Independent Sitting-balance support: No upper extremity supported;Feet supported Sitting balance-Leahy Scale: Good     Standing balance support: No upper extremity supported;During functional activity Standing balance-Leahy Scale: Fair Standing balance comment: Stood at sink for ~10 minutes to perform bathing and IADLs. BUEs moving w/o LOB                             Pertinent Vitals/Pain Pain Assessment: No/denies pain    Home Living Family/patient expects to be discharged to:: Assisted living               Home Equipment: Walker - 2 wheels;Grab bars - toilet Additional Comments: Uses O2 PRN    Prior Function Level of Independence: Independent with assistive device(s)         Comments: use of RW and O2 when pt needs. walks to dining hall and attends multiple events at ALF, had done group exercise classes in the past and will return  to that     Hand Dominance   Dominant Hand: Right    Extremity/Trunk Assessment   Upper Extremity Assessment: Overall WFL for tasks assessed           Lower Extremity Assessment: Overall WFL for tasks assessed      Cervical / Trunk Assessment: Normal  Communication   Communication: No difficulties  Cognition Arousal/Alertness: Awake/alert Behavior During Therapy: WFL for tasks assessed/performed Overall Cognitive Status: Within Functional Limits for tasks assessed                       General Comments General comments (skin integrity, edema, etc.): Pt on SpO2 4L through [place]. after initial bedmobility w/o SpO2 dropped to 89%. Returned to 93% on 4L O2    Exercises        Assessment/Plan    PT Assessment Patient needs continued PT services  PT Diagnosis Generalized weakness   PT Problem List Decreased strength;Decreased activity tolerance;Decreased balance  PT Treatment Interventions DME instruction;Gait training;Functional mobility training;Therapeutic exercise;Therapeutic activities;Balance training   PT Goals (Current goals can be found in the Care Plan section) Acute Rehab PT Goals Patient Stated Goal: go back to ALF PT Goal Formulation: With patient Time For Goal Achievement: 01/13/16 Potential to Achieve Goals: Good    Frequency Min 2X/week   Barriers to discharge        Co-evaluation               End of Session Equipment Utilized During Treatment: Gait belt;Oxygen Activity Tolerance: Patient tolerated treatment well Patient left: in chair;with call bell/phone within reach;with chair alarm set Nurse Communication: Mobility status    Functional Assessment Tool Used: clinical judgment Functional Limitation: Mobility: Walking and moving around Mobility: Walking and Moving Around Current Status [id number]): At least 1 percent but less than 20 percent impaired, limited or restricted Mobility: Walking and Moving Around Goal Status [phone]): 0 percent impaired, limited or restricted    Time: 0900-0938 PT Time Calculation (min) (ACUTE ONLY): 38 min   Charges:   PT Evaluation $PT Eval Moderate Complexity: 1 Procedure PT Treatments $Therapeutic Activity: 8-22 mins $Self Care/Home Management: 8-22   PT G Codes:   PT G-Codes **NOT FOR INPATIENT CLASS** Functional Assessment Tool Used: clinical judgment Functional Limitation: Mobility: Walking and moving around Mobility: Walking and Moving Around Current Status [id number]): At least 1  percent but less than 20 percent impaired, limited or restricted Mobility: Walking and Moving Around Goal Status [phone]): 0 percent impaired, limited or restricted    Ara Kussmaul 12/30/2015, 10:17 AM  Ara Kussmaul, Student Physical Therapist Acute Rehab [phone]

## 2015-12-30 NOTE — Progress Notes (Signed)
Pt wanting to try not using oxygen.  O2 removed pt dropped to 83% while sitting in chair.  RN instructed pt to place o2 back on.  O2 back up to 91%, will continue to monitor.

## 2015-12-30 NOTE — Progress Notes (Signed)
Patient seen and examined. Case d/w residents in detail.  HPI: Patient is a 82 y/o female with PMH of afib on a/c, non ischemic CM, CKD stage 3, HTN, chronic LBBB, ? COPD who p/w worsening SOB and wheezing. Patient initially developed cough on 1/21 productive of rusty sputum and then noted fevers and chills beginning 2 days later. She was seen by her PCP and prescribed azithromycin and initially improved but yesterday noted worsening SOB and wheezing and came to ED. No CP, no abd pain, no n/v. She did complain of 2-3 episodes of diarrhea prior to coming to hospital - watery, no hematochezia, no melena. She does endorse sick contacts at ALF. Today she feels much improved and no further episodes of diarrhea.  Physical exam: Gen: AAO*3, NAD CVS: irregularly irregular, normal heart sounds Lungs: b/l exp wheeze + Abd: soft, non tender, non distended, BS + Ext: no edema  Assessment and Plan: 82 y/o female p/w worsening SOB and wheezing after being initially treated with azithromycin by outpatient PCP found to be flu + here. CXR with no infiltrates. Will c/w tamiflu.  Patient still with episodes of wheezing. Will c/w nebs prn and prednisone 40 mg PO * 5 days to treat for likely COPD exacerbation.  Patient with CKD at baseline CR. Outpatient f/u  Patient also noted to have anemia - possibly secondary to CKD as well as history of iron def anemia. Outpatient f/u. Will c/w FeSO4. Hg is at baseline

## 2015-12-30 NOTE — Progress Notes (Signed)
Subjective:  The patient said she was still a little short of breath but felt much better. She said she was still wheezing. She says her loose stools have resolved.   Objective: Vital signs in last 24 hours: Filed Vitals:   12/30/15 0425 12/30/15 0431 12/30/15 0646 12/30/15 0732  BP:   128/63   Pulse:   92   Temp:   98 F (36.7 C)   TempSrc:   Oral   Resp:   22   Height:      Weight:   133 lb 14.4 oz (60.737 kg)   SpO2: 78% 95% 96% 95%   Weight change:   Intake/Output Summary (Last 24 hours) at 12/30/15 1115 Last data filed at 12/30/15 1104  Gross per 24 hour  Intake    480 ml  Output   1025 ml  Net   -545 ml   Physical Exam General: Lying in bed, NAD HEENT: NCAT,  Sclerae ancicteric, Cardiovascular: irregularly irregular rhythm, regular rate. No murmurs, rubs, or gallops Pulmonary: Rhonchorous sounds and wheezes in all fields, even heard anteriorly. No audible crackles.  Abdominal: Soft. NTND. Normal BS Extremities: No clubbing, edema, or cyanosis Neurological: AAOx3. Face symmetric Psychiatric: Normal behavior and affect. Lab Results: Basic Metabolic Panel:  Recent Labs Lab 12/29/15 1100 12/30/15 0449  NA 129* 134*  K 3.9 4.4  CL 88* 91*  CO2 29 33*  GLUCOSE 103* 105*  BUN 41* 36*  CREATININE 1.72* 1.49*  CALCIUM 8.8* 8.6*   Liver Function Tests: No results for input(s): AST, ALT, ALKPHOS, BILITOT, PROT, ALBUMIN in the last 168 hours. No results for input(s): LIPASE, AMYLASE in the last 168 hours. No results for input(s): AMMONIA in the last 168 hours. CBC:  Recent Labs Lab 12/29/15 1100 12/30/15 0847  WBC 4.1 3.4*  NEUTROABS 1.8  --   HGB 9.9* 10.1*  HCT 29.9* 30.4*  MCV 94.9 95.6  PLT 138* 144*   Urine Drug Screen: Drugs of Abuse     Component Value Date/Time   LABOPIA POSITIVE* 08/24/2012 1119   COCAINSCRNUR NONE DETECTED 08/24/2012 1119   LABBENZ NONE DETECTED 08/24/2012 1119   AMPHETMU NONE DETECTED 08/24/2012 1119   THCU NONE  DETECTED 08/24/2012 1119   LABBARB NONE DETECTED 08/24/2012 1119     Micro Results: Recent Results (from the past 240 hour(s))  MRSA PCR Screening     Status: None   Collection Time: 12/29/15  8:27 PM  Result Value Ref Range Status   MRSA by PCR NEGATIVE NEGATIVE Final    Comment:        The GeneXpert MRSA Assay (FDA approved for NASAL specimens only), is one component of a comprehensive MRSA colonization surveillance program. It is not intended to diagnose MRSA infection nor to guide or monitor treatment for MRSA infections.    Studies/Results: Dg Ribs Unilateral Right  12/29/2015  CLINICAL DATA:  Fall today. Right rib pain and bruising. Initial encounter. EXAM: RIGHT RIBS - 2 VIEW COMPARISON:  None. FINDINGS: No acute right-sided rib fractures identified. No other bone lesions identified. Prominent anterior costochondral calcification noted. No pneumothorax or hemothorax visualized. IMPRESSION: No acute right rib fractures identified. Electronically Signed   By: Earle Gell M.D.   On: 12/29/2015 14:02   Dg Chest Portable 1 View  12/29/2015  CLINICAL DATA:  Cough, congestion EXAM: PORTABLE CHEST 1 VIEW COMPARISON:  08/23/2012 FINDINGS: There is cardiomegaly with vascular congestion. Mild interstitial prominence could reflect interstitial edema. There is hyperinflation of the  lungs compatible with COPD. No effusions or acute bony abnormality. IMPRESSION: COPD. Question mild interstitial edema/ CHF. Electronically Signed   By: Rolm Baptise M.D.   On: 12/29/2015 11:58   Medications: I have reviewed the patient's current medications. Scheduled Meds: . apixaban  2.5 mg Oral BID  . azithromycin  250 mg Oral Daily  . carvedilol  6.25 mg Oral BID WC  . ferrous sulfate  325 mg Oral Q breakfast  . furosemide  80 mg Oral Daily  . gabapentin  600 mg Oral TID  . ipratropium-albuterol  3 mL Nebulization Q4H  . neomycin-bacitracin-polymyxin   Topical BID  . oseltamivir  30 mg Oral QHS  .  OXcarbazepine  75 mg Oral TID  . potassium chloride SA  20 mEq Oral Daily  . predniSONE  40 mg Oral Q breakfast  . simvastatin  5 mg Oral q1800  . temazepam  15 mg Oral QHS   Continuous Infusions:  PRN Meds:.oxyCODONE Assessment/Plan:  Influenza: She is flu positive. Will managed with supportive care and discharge today.  - Oseltamivir 30 mg BID (renall dosed) for 5 days (last dose 1/30) - Encourage family and visitors wear a mask when in the room  Atrial Fibrillation (CHADS-VASc 5): Currently in Afib on exam and telemetry. - Eliquis 2.5 mg BID - Carvedilol 6.25 mg BID  CHF (EF 35-40%): Low suspicion for CHF exacerbation at this time, as above. - Furosemide 80 mg daily - Daily weights - Strict I's O's - K-dur 20 mEq daily  Diarrhea: Likely related to opioid taper and/or influenza. Will discontinue C. Diff panel.  Hyponatremia: Currently 134. Resolved  CKD Stage 3: Creatinine near baseline of 1.4  Trigeminal Neuralgia: - Trileptal 75 mg TID - Gabapenting 600 mg TID - Oxycodone 2.5 mg BID - Temazepam 15 mg QHS  Iron Deficiency Anemia: Hgb 9.9 - Ferrous sulfate 325 mg daily  DVT prophylaxis: Eliquis as above  Dispo: Anticipated discharge today to [place].  The patient does have a current PCP Reymundo Poll, MD) and does not need an [facility] hospital follow-up appointment after discharge.  The patient does have transportation limitations that hinder transportation to clinic appointments.  .Services Needed at time of discharge: Y = Yes, Blank = No PT:   OT:   RN:   Equipment:   Other:       Liberty Handy, MD 12/30/2015, 11:15 AM

## 2015-12-31 DIAGNOSIS — J09X2 Influenza due to identified novel influenza A virus with other respiratory manifestations: Secondary | ICD-10-CM | POA: Diagnosis not present

## 2015-12-31 DIAGNOSIS — N183 Chronic kidney disease, stage 3 (moderate): Secondary | ICD-10-CM | POA: Diagnosis not present

## 2015-12-31 DIAGNOSIS — I4891 Unspecified atrial fibrillation: Secondary | ICD-10-CM | POA: Diagnosis not present

## 2015-12-31 DIAGNOSIS — I130 Hypertensive heart and chronic kidney disease with heart failure and stage 1 through stage 4 chronic kidney disease, or unspecified chronic kidney disease: Secondary | ICD-10-CM | POA: Diagnosis not present

## 2015-12-31 DIAGNOSIS — J111 Influenza due to unidentified influenza virus with other respiratory manifestations: Secondary | ICD-10-CM | POA: Diagnosis not present

## 2015-12-31 DIAGNOSIS — I509 Heart failure, unspecified: Secondary | ICD-10-CM

## 2015-12-31 DIAGNOSIS — G500 Trigeminal neuralgia: Secondary | ICD-10-CM

## 2015-12-31 DIAGNOSIS — D509 Iron deficiency anemia, unspecified: Secondary | ICD-10-CM

## 2015-12-31 LAB — CBC
HEMATOCRIT: 29.1 % — AB (ref 36.0–46.0)
HEMOGLOBIN: 9.2 g/dL — AB (ref 12.0–15.0)
MCH: 30.5 pg (ref 26.0–34.0)
MCHC: 31.6 g/dL (ref 30.0–36.0)
MCV: 96.4 fL (ref 78.0–100.0)
Platelets: 156 10*3/uL (ref 150–400)
RBC: 3.02 MIL/uL — ABNORMAL LOW (ref 3.87–5.11)
RDW: 14.9 % (ref 11.5–15.5)
WBC: 3.1 10*3/uL — ABNORMAL LOW (ref 4.0–10.5)

## 2015-12-31 LAB — BASIC METABOLIC PANEL
Anion gap: 10 (ref 5–15)
BUN: 40 mg/dL — AB (ref 6–20)
CALCIUM: 8.5 mg/dL — AB (ref 8.9–10.3)
CHLORIDE: 92 mmol/L — AB (ref 101–111)
CO2: 31 mmol/L (ref 22–32)
CREATININE: 1.43 mg/dL — AB (ref 0.44–1.00)
GFR calc Af Amer: 38 mL/min — ABNORMAL LOW (ref >60)
GFR calc non Af Amer: 33 mL/min — ABNORMAL LOW (ref >60)
GLUCOSE: 89 mg/dL (ref 65–99)
Potassium: 3.9 mmol/L (ref 3.5–5.1)
Sodium: 133 mmol/L — ABNORMAL LOW (ref 135–145)

## 2015-12-31 LAB — LEGIONELLA ANTIGEN, URINE

## 2015-12-31 LAB — PROCALCITONIN: Procalcitonin: 0.13 ng/mL

## 2015-12-31 MED ORDER — PREDNISONE 20 MG PO TABS: 40.0000 mg | ORAL_TABLET | Freq: Every day | ORAL | Status: DC

## 2015-12-31 NOTE — Progress Notes (Signed)
SATURATION QUALIFICATIONS: (This note is used to comply with regulatory documentation for home oxygen)  Patient Saturations on Room Air at Rest = 89%  Patient Saturations on Room Air while Ambulating = 85%  Patient Saturations on 2 Liters of oxygen while Ambulating =91 %  Please briefly explain why patient needs home oxygen: 

## 2015-12-31 NOTE — Progress Notes (Addendum)
Subjective:  The patient denied any dyspnea symptoms. She reported that her wheezing had improved. She had no other complaints.  Objective: Vital signs in last 24 hours: Filed Vitals:   12/31/15 0644 12/31/15 0657 12/31/15 1106 12/31/15 1152  BP:  141/81  100/62  Pulse:  97  66  Temp:  97.8 F (36.6 C)  97.5 F (36.4 C)  TempSrc:  Oral  Oral  Resp:  18  18  Height:      Weight: 134 lb 4.8 oz (60.918 kg)     SpO2:  100% 89% 95%   Weight change: -1 lb 11.2 oz (-0.771 kg)  Intake/Output Summary (Last 24 hours) at 12/31/15 1303 Last data filed at 12/31/15 1054  Gross per 24 hour  Intake    960 ml  Output   1900 ml  Net   -940 ml   Physical Exam General: Lying in bed, NAD HEENT: NCAT,  Sclerae ancicteric, Cardiovascular: irregularly irregular rhythm, regular rate. No murmurs, rubs, or gallops Pulmonary: Rhonchorous breath sounds but improved from yesterday.  Abdominal: Soft. NTND. Normal BS Extremities: No clubbing, edema, or cyanosis Neurological: tongue midline. Face symmetric Psychiatric: Normal behavior and affect. Lab Results: Basic Metabolic Panel:  Recent Labs Lab 12/30/15 0449 12/31/15 0426  NA 134* 133*  K 4.4 3.9  CL 91* 92*  CO2 33* 31  GLUCOSE 105* 89  BUN 36* 40*  CREATININE 1.49* 1.43*  CALCIUM 8.6* 8.5*   Liver Function Tests: No results for input(s): AST, ALT, ALKPHOS, BILITOT, PROT, ALBUMIN in the last 168 hours. No results for input(s): LIPASE, AMYLASE in the last 168 hours. No results for input(s): AMMONIA in the last 168 hours. CBC:  Recent Labs Lab 12/29/15 1100 12/30/15 0847 12/31/15 0426  WBC 4.1 3.4* 3.1*  NEUTROABS 1.8  --   --   HGB 9.9* 10.1* 9.2*  HCT 29.9* 30.4* 29.1*  MCV 94.9 95.6 96.4  PLT 138* 144* 156   Urine Drug Screen: Drugs of Abuse     Component Value Date/Time   LABOPIA POSITIVE* 08/24/2012 1119   COCAINSCRNUR NONE DETECTED 08/24/2012 1119   LABBENZ NONE DETECTED 08/24/2012 1119   AMPHETMU NONE  DETECTED 08/24/2012 1119   THCU NONE DETECTED 08/24/2012 1119   LABBARB NONE DETECTED 08/24/2012 1119     Micro Results: Recent Results (from the past 240 hour(s))  Culture, blood (routine x 2)     Status: None (Preliminary result)   Collection Time: 12/29/15  5:40 PM  Result Value Ref Range Status   Specimen Description BLOOD LEFT ANTECUBITAL  Final   Special Requests BOTTLES DRAWN AEROBIC AND ANAEROBIC 5CC  Final   Culture NO GROWTH 2 DAYS  Final   Report Status PENDING  Incomplete  Culture, blood (routine x 2)     Status: None (Preliminary result)   Collection Time: 12/29/15  5:50 PM  Result Value Ref Range Status   Specimen Description BLOOD RIGHT ANTECUBITAL  Final   Special Requests BOTTLES DRAWN AEROBIC AND ANAEROBIC 5CC  Final   Culture NO GROWTH 2 DAYS  Final   Report Status PENDING  Incomplete  MRSA PCR Screening     Status: None   Collection Time: 12/29/15  8:27 PM  Result Value Ref Range Status   MRSA by PCR NEGATIVE NEGATIVE Final    Comment:        The GeneXpert MRSA Assay (FDA approved for NASAL specimens only), is one component of a comprehensive MRSA colonization surveillance program. It is  not intended to diagnose MRSA infection nor to guide or monitor treatment for MRSA infections.    Studies/Results: Dg Ribs Unilateral Right  12/29/2015  CLINICAL DATA:  Fall today. Right rib pain and bruising. Initial encounter. EXAM: RIGHT RIBS - 2 VIEW COMPARISON:  None. FINDINGS: No acute right-sided rib fractures identified. No other bone lesions identified. Prominent anterior costochondral calcification noted. No pneumothorax or hemothorax visualized. IMPRESSION: No acute right rib fractures identified. Electronically Signed   By: Earle Gell M.D.   On: 12/29/2015 14:02   Medications: I have reviewed the patient's current medications. Scheduled Meds: . apixaban  2.5 mg Oral BID  . carvedilol  6.25 mg Oral BID WC  . ferrous sulfate  325 mg Oral Q breakfast  .  furosemide  80 mg Oral Daily  . gabapentin  600 mg Oral TID  . neomycin-bacitracin-polymyxin   Topical BID  . oseltamivir  30 mg Oral QHS  . OXcarbazepine  75 mg Oral TID  . potassium chloride SA  20 mEq Oral Daily  . predniSONE  40 mg Oral Q breakfast  . simvastatin  5 mg Oral q1800  . temazepam  15 mg Oral QHS   Continuous Infusions:  PRN Meds:.ipratropium-albuterol, oxyCODONE Assessment/Plan:  Influenza: She is flu positive. Will be managed with supportive care and discharge today.  - Oseltamivir 30 mg BID (renally dosed) for 5 days (last dose 1/30) - Encourage family and visitors wear a mask when in the room  Atrial Fibrillation (CHADS-VASc 5): Currently in Afib on exam and telemetry. - Eliquis 2.5 mg BID - Carvedilol 6.25 mg BID  CHF (EF 35-40%): Low suspicion for CHF exacerbation at this time. At dry weight. - Furosemide 80 mg daily - Daily weights - Strict I's O's - K-dur 20 mEq daily  Questionable COPD: Nonsmoker, no PFTs on file.  - Prednisone 40 mg daily for 5 day course, including the dexamethasone given on admission (last dose 1/30)  CKD Stage 3: Creatinine near baseline of 1.4  Trigeminal Neuralgia: - Trileptal 75 mg TID - Gabapenting 600 mg TID - Oxycodone 2.5 mg BID - Temazepam 15 mg QHS  Iron Deficiency Anemia: Hgb 9.9 - Ferrous sulfate 325 mg daily  DVT prophylaxis: Eliquis as above  Dispo: Anticipated discharge today to [place].  The patient does have a current PCP Reymundo Poll, MD) and does not need an [facility] hospital follow-up appointment after discharge.  The patient does have transportation limitations that hinder transportation to clinic appointments.  .Services Needed at time of discharge: Y = Yes, Blank = No PT:   OT:   RN:   Equipment:   Other: Home Oxygen      Liberty Handy, MD 12/31/2015, 1:03 PM

## 2015-12-31 NOTE — Discharge Instructions (Addendum)
[name], you were in the hospital for influenza. This will usually resolve within a week. Please drink plenty of fluids and make sure family and visitors wear face masks when they enter your room. If you have a fever, chills, night sweats, or worsening shortness of breath, you may be developing a bacterial pneumonia. This can sometimes happen after people get flu. In this case, please seek medical attention.  You may have a mild COPD exacerbation as well. However, given that you are nonsmoker and have never had pulmonary testing, this should be definitively diagnosed. I encourage you to obtain pulmonary function testings (PFTs). You will also take two pills of prednisone (40 mg) daily for three more days.  Information on my medicine - ELIQUIS (apixaban)  This medication education was reviewed with me or my healthcare representative as part of my discharge preparation.  The pharmacist that spoke with me during my hospital stay was:  Angela Franco, [facility]  Why was Eliquis prescribed for you? Eliquis was prescribed for you to reduce the risk of a blood clot forming that can cause a stroke if you have a medical condition called atrial fibrillation (a type of irregular heartbeat).  What do You need to know about Eliquis ? Take your Eliquis TWICE DAILY - one tablet in the morning and one tablet in the evening with or without food. If you have difficulty swallowing the tablet whole please discuss with your pharmacist how to take the medication safely.  Take Eliquis exactly as prescribed by your doctor and DO NOT stop taking Eliquis without talking to the doctor who prescribed the medication.  Stopping may increase your risk of developing a stroke.  Refill your prescription before you run out.  After discharge, you should have regular check-up appointments with your healthcare provider that is prescribing your Eliquis.  In the future your dose may need to be changed if your kidney function or  weight changes by a significant amount or as you get older.  What do you do if you miss a dose? If you miss a dose, take it as soon as you remember on the same day and resume taking twice daily.  Do not take more than one dose of ELIQUIS at the same time to make up a missed dose.  Important Safety Information A possible side effect of Eliquis is bleeding. You should call your healthcare provider right away if you experience any of the following: ? Bleeding from an injury or your nose that does not stop. ? Unusual colored urine (red or dark brown) or unusual colored stools (red or black). ? Unusual bruising for unknown reasons. ? A serious fall or if you hit your head (even if there is no bleeding).  Some medicines may interact with Eliquis and might increase your risk of bleeding or clotting while on Eliquis. To help avoid this, consult your healthcare provider or pharmacist prior to using any new prescription or non-prescription medications, including herbals, vitamins, non-steroidal anti-inflammatory drugs (NSAIDs) and supplements.  This website has more information on Eliquis (apixaban): http://[email or web address].com/eliquis/home

## 2015-12-31 NOTE — Discharge Summary (Signed)
Name: Angela Franco MRN: [id number] DOB: 1929/01/15 82 y.o. PCP: Reymundo Poll, MD  Date of Admission: 12/29/2015 10:29 AM Date of Discharge: 12/31/2015 Attending Physician: Aldine Contes, MD  Discharge Diagnosis: 1. Influenza 2. Atrial Fibrillation 3. CHF 4. CKD Stage 3 5. Trigeminal Neuralgia 6. Iron Deficiency Anemia 7. HTN  Discharge Medications:   Medication List    STOP taking these medications        azithromycin 250 MG tablet  Commonly known as:  ZITHROMAX     doxycycline 100 MG capsule  Commonly known as:  VIBRAMYCIN     fentaNYL 25 MCG/HR patch  Commonly known as:  DURAGESIC - dosed mcg/hr      TAKE these medications        albuterol 108 (90 Base) MCG/ACT inhaler  Commonly known as:  PROVENTIL HFA;VENTOLIN HFA  Inhale 2 puffs into the lungs every 6 (six) hours as needed for wheezing or shortness of breath.     apixaban 2.5 MG Tabs tablet  Commonly known as:  ELIQUIS  Take 1 tablet (2.5 mg total) by mouth 2 (two) times daily.     calcium-vitamin D 500-200 MG-UNIT tablet  Commonly known as:  OSCAL WITH D  Take 1 tablet by mouth daily with breakfast.     carvedilol 6.25 MG tablet  Commonly known as:  COREG  Take 1 tablet (6.25 mg total) by mouth 2 (two) times daily with a meal.     DSS 100 MG Caps  Take 200 mg by mouth at bedtime as needed.     ferrous sulfate 325 (65 FE) MG tablet  Take 325 mg by mouth daily with breakfast.     furosemide 80 MG tablet  Commonly known as:  LASIX  Take 1 tablet (80 mg total) by mouth daily.     gabapentin 600 MG tablet  Commonly known as:  NEURONTIN  Take 1 tablet (600 mg total) by mouth 3 (three) times daily. Please dose at 8am, 2pm and 8pm     oseltamivir 30 MG capsule  Commonly known as:  TAMIFLU  Take 1 capsule (30 mg total) by mouth at bedtime.     OXcarbazepine 150 MG tablet  Commonly known as:  TRILEPTAL  Half tablets twice a day,  may increase to one tablets twice a day in one week     oxyCODONE 5 MG immediate release tablet  Commonly known as:  Oxy IR/ROXICODONE  1/2 tab po as needed q8 hours     potassium chloride SA 20 MEQ tablet  Commonly known as:  K-DUR,KLOR-CON  Take 20 mEq by mouth daily.     predniSONE 20 MG tablet  Commonly known as:  DELTASONE  Take 2 tablets (40 mg total) by mouth daily with breakfast.     simvastatin 10 MG tablet  Commonly known as:  ZOCOR  Take 0.5 tablets (5 mg total) by mouth daily at 6 PM.     temazepam 15 MG capsule  Commonly known as:  RESTORIL  Take 15 mg by mouth at bedtime.        Disposition and follow-up:   Angela Franco was discharged from [facility] in stable condition.  At the hospital follow up visit please address:  1.  Patient has influenza. This will likely resolve on its own, and has already improved. Continue evaluate to make sure she is not developing a superimposed bacterial pneumonia. She will complete a course of oseltamivir.  2. Patient has a history of  COPD in her chart, but there are no PFTs on record. She has no smoking history.   2.  Labs / imaging needed at time of follow-up: None  3.  Pending labs/ test needing follow-up: Blood cultures, urine legionella pending at discharge.  Follow-up Appointments: Follow-up Information    Schedule an appointment as soon as possible for a visit with Reymundo Poll, MD.   Specialty:  Family Medicine   Contact information:   [address] [place] [address] [phone]       Follow up with Pixie Casino, MD. Schedule an appointment as soon as possible for a visit in 1 week.   Specialty:  Cardiology   Contact information:   [place] [address] [phone]       Discharge Instructions:   Angela Franco, you were in the hospital for influenza. This will usually resolve within a week. Please drink plenty of fluids and make sure family and visitors wear face masks when they enter your  room. If you have a fever, chills, night sweats, or worsening shortness of breath, you may be developing a bacterial pneumonia. This can sometimes happen after people get flu. In this case, please seek medical attention.  You may have a mild COPD exacerbation as well. However, given that you are nonsmoker and have never had pulmonary testing, this should be definitively diagnosed. I encourage you to obtain pulmonary function testings (PFTs). You will also take two pills of prednisone (40 mg) daily for three more days.   Procedures Performed:  Dg Ribs Unilateral Right  12/29/2015  CLINICAL DATA:  Fall today. Right rib pain and bruising. Initial encounter. EXAM: RIGHT RIBS - 2 VIEW COMPARISON:  None. FINDINGS: No acute right-sided rib fractures identified. No other bone lesions identified. Prominent anterior costochondral calcification noted. No pneumothorax or hemothorax visualized. IMPRESSION: No acute right rib fractures identified. Electronically Signed   By: Earle Gell M.D.   On: 12/29/2015 14:02   Dg Chest Portable 1 View  12/29/2015  CLINICAL DATA:  Cough, congestion EXAM: PORTABLE CHEST 1 VIEW COMPARISON:  08/23/2012 FINDINGS: There is cardiomegaly with vascular congestion. Mild interstitial prominence could reflect interstitial edema. There is hyperinflation of the lungs compatible with COPD. No effusions or acute bony abnormality. IMPRESSION: COPD. Question mild interstitial edema/ CHF. Electronically Signed   By: Rolm Baptise M.D.   On: 12/29/2015 11:58   Dg Finger Ring Left  12/10/2015  CLINICAL DATA:  Pain, redness and swelling of the left ring finger for 3 days. No known injury. Initial encounter. EXAM: LEFT RING FINGER 2+V COMPARISON:  None. FINDINGS: No acute bony or joint abnormality is identified. Soft tissues about the left ring finger are mildly swollen. Degenerative change at the DIP joint is noted. There is also degenerative disease at the DIP joint of the left long finger.  IMPRESSION: Mild appearing soft tissue swelling without acute bony or joint abnormality. Degenerative disease DIP joint left ring and long fingers. Electronically Signed   By: Inge Rise M.D.   On: 12/10/2015 15:51    Admission HPI: Angela Franco is an 82 year old woman with a past medical history of atrial fibrillation (CHADS-VASC 5 on Eliquis), non-ischemic cardiomyopathy (CHF EF 40-45/% 03/2015), CKD Stage 3, HTN, chronic LBBB, trigeminal neuralgia, and questionable COPD (no PFTs on file) who presents with shortness of breath and wheezing. She was joined by her daughter, [name], who assisted with the history. Her symptoms started on  1/21 with a productive cough (rusty sputum) and had developed sinus congestion by 1/22. On Monday 1/23 she had some chills and a recorded temperature of 101F. Her PCP had come to her assisted living facility [facility]) and prescribed azithromycin. She had felt better but by this morning, her wheezing had worsened and she felt increasingly short of breath. She has also been using her oxygen almost constantly. She typically only uses it once every two months. She has had multiple sick contacts in her ALF, with one close friend recently passing from a pneumonia. She also had a fall earlier this week from feeling "loopy" after being given cough medicine with dextromethorpan, resulting in a bruise on her back. She has had no episodes since. Her CHF has been well controlled, noted to have an exacerbation in 10/2015 and her Lasix dose was increased to her present dose. She is currently at her dry weight of 136 lbs. She denies any orthopnea, but says she wakes up in the middle of the night short of breath recently. She has ongoing hyponatremia, thought to be due to the Trileptal for her trigeminal neuralgia. She recorded three episodes of loose stools after she had begum to taper down her oxycodone dose. She has had multiple episodes of cellulitis on her legs and hands  since 08/2015, treated with multiple courses of antibiotics, the most recent being doxycycline. Otherwise, she denies headache, new one-sided weakness, confusion, blackouts, chest pain, recent weight gain, leg swelling, nausea, vomiting, constipation, abdominal pain, joint pain, or night sweats. She has never smoked and has not been consistently exposed to second hand smoke since she was 32. She denies any alcohol or drug use. She currently lives at [place].  Hospital Course by problem list:   Influenza: Her flu panel was positive for inflenza A. She was started on a 5 day regimen of oseltamivir. She was satting >95% on 2-4L [place]. She continued to sound rhonchorous and wheezy on exam, but had improved significantly on nebulized bronchodilators. She was evaluated by PT, who recommended no further services. She was noted to be satting 90% on room air, desatting to the 80s, on day of discharge; therefore, she was discharged with home oxygen with the expectation that it would improve with the resolution of her pneumonia.  Hyponatremia: In high 120s on admission which improved to 134 on day of discharge. UrOsm and UNa were low, consistent with hypovolemia in the setting of acute illness.   Atrial Fibrillation (CHADS-VASc 5): Patient remained rate controlled but in a fib on Carvedilol 6.25 mg BID. She remained on Eliquis 2.5 mg BID.   CHF (EF 35-40%): There was a low suspicion for CHF exacerbation given she was at her dry weight of 135 on admission, lacked crackles on exam, and had a reassuring BNP. She was maintained on Furosemide 80 mg daily with home K-dur.  Mild COPD Exacerbation: As a lifetime nonsmoker and no PFTs on file, it was difficult to ascertain if she truly had COPD. Nonetheless, she was noted to have hyperinflation on CXR. She was maintained on prednisone 40 mg daily, and discharged to complete a total 5-day steroid course.  CKD Stage 3: Her creatinine was near baseline of 1.4 on  discharge.  Trigeminal Neuralgia: Maintained on Trileptal 75 mg TID, Gabapenting 600 mg TID, Oxycodone 2.5 mg BID, and Temazepam 15 mg QHS  Iron Deficiency Anemia: Hgb stable in the 9's during admission. Maintained on Ferrous sulfate 325 mg daily.  Discharge Vitals:  BP 100/62 mmHg  Pulse 66  Temp(Src) 97.5 F (36.4 C) (Oral)  Resp 18  Ht 5\' 3"  (1.6 m)  Wt 134 lb 4.8 oz (60.918 kg)  BMI 23.80 kg/m2  SpO2 95%  Discharge Labs:  Results for orders placed or performed during the hospital encounter of 12/29/15 (from the past 24 hour(s))  Procalcitonin     Status: None   Collection Time: 12/31/15  4:26 AM  Result Value Ref Range   Procalcitonin 0.13 ng/mL  CBC     Status: Abnormal   Collection Time: 12/31/15  4:26 AM  Result Value Ref Range   WBC 3.1 (L) 4.0 - 10.5 K/uL   RBC 3.02 (L) 3.87 - 5.11 MIL/uL   Hemoglobin 9.2 (L) 12.0 - 15.0 g/dL   HCT 29.1 (L) 36.0 - 46.0 %   MCV 96.4 78.0 - 100.0 fL   MCH 30.5 26.0 - 34.0 pg   MCHC 31.6 30.0 - 36.0 g/dL   RDW 14.9 11.5 - 15.5 %   Platelets 156 150 - 400 K/uL  Basic metabolic panel     Status: Abnormal   Collection Time: 12/31/15  4:26 AM  Result Value Ref Range   Sodium 133 (L) 135 - 145 mmol/L   Potassium 3.9 3.5 - 5.1 mmol/L   Chloride 92 (L) 101 - 111 mmol/L   CO2 31 22 - 32 mmol/L   Glucose, Bld 89 65 - 99 mg/dL   BUN 40 (H) 6 - 20 mg/dL   Creatinine, Ser 1.43 (H) 0.44 - 1.00 mg/dL   Calcium 8.5 (L) 8.9 - 10.3 mg/dL   GFR calc non Af Amer 33 (L) >60 mL/min   GFR calc Af Amer 38 (L) >60 mL/min   Anion gap 10 5 - 15    Signed: Liberty Handy, MD 12/31/2015, 1:23 PM   Equipment: Home Oxygen

## 2015-12-31 NOTE — Progress Notes (Signed)
Patient seen and examined. Case d/w residents in detail. I agree with findings and plan as documented in Dr. Barbera Setters note.  Patient is much improved today. No new complaints. Still with b/l rhonchi on lung exam but improved. Complete course of oseltamivir for flu. No further w/u for now. Patient is stable for d/c home today. Will need to complete prednisone course for possible assoc COPD exacerbation as well.

## 2016-01-02 ENCOUNTER — Encounter (HOSPITAL_COMMUNITY): Payer: Self-pay | Admitting: Emergency Medicine

## 2016-01-02 ENCOUNTER — Observation Stay (HOSPITAL_COMMUNITY)
Admission: EM | Admit: 2016-01-02 | Discharge: 2016-01-04 | Disposition: A | Discharge status: Home / Self Care | Payer: Medicare Other | Attending: Internal Medicine | Admitting: Internal Medicine

## 2016-01-02 ENCOUNTER — Emergency Department (HOSPITAL_COMMUNITY): Payer: Medicare Other

## 2016-01-02 DIAGNOSIS — N390 Urinary tract infection, site not specified: Secondary | ICD-10-CM | POA: Diagnosis not present

## 2016-01-02 DIAGNOSIS — E785 Hyperlipidemia, unspecified: Secondary | ICD-10-CM | POA: Diagnosis not present

## 2016-01-02 DIAGNOSIS — Z880 Allergy status to penicillin: Secondary | ICD-10-CM | POA: Diagnosis not present

## 2016-01-02 DIAGNOSIS — I4891 Unspecified atrial fibrillation: Secondary | ICD-10-CM | POA: Diagnosis not present

## 2016-01-02 DIAGNOSIS — Z79899 Other long term (current) drug therapy: Secondary | ICD-10-CM | POA: Insufficient documentation

## 2016-01-02 DIAGNOSIS — J1089 Influenza due to other identified influenza virus with other manifestations: Principal | ICD-10-CM | POA: Insufficient documentation

## 2016-01-02 DIAGNOSIS — E871 Hypo-osmolality and hyponatremia: Secondary | ICD-10-CM | POA: Diagnosis not present

## 2016-01-02 DIAGNOSIS — G500 Trigeminal neuralgia: Secondary | ICD-10-CM | POA: Insufficient documentation

## 2016-01-02 DIAGNOSIS — I482 Chronic atrial fibrillation, unspecified: Secondary | ICD-10-CM | POA: Diagnosis present

## 2016-01-02 DIAGNOSIS — I10 Essential (primary) hypertension: Secondary | ICD-10-CM | POA: Diagnosis present

## 2016-01-02 DIAGNOSIS — J449 Chronic obstructive pulmonary disease, unspecified: Secondary | ICD-10-CM | POA: Diagnosis present

## 2016-01-02 DIAGNOSIS — J101 Influenza due to other identified influenza virus with other respiratory manifestations: Secondary | ICD-10-CM | POA: Diagnosis present

## 2016-01-02 DIAGNOSIS — I428 Other cardiomyopathies: Secondary | ICD-10-CM

## 2016-01-02 DIAGNOSIS — M8580 Other specified disorders of bone density and structure, unspecified site: Secondary | ICD-10-CM | POA: Diagnosis not present

## 2016-01-02 DIAGNOSIS — D649 Anemia, unspecified: Secondary | ICD-10-CM | POA: Insufficient documentation

## 2016-01-02 DIAGNOSIS — J111 Influenza due to unidentified influenza virus with other respiratory manifestations: Secondary | ICD-10-CM | POA: Diagnosis present

## 2016-01-02 DIAGNOSIS — I509 Heart failure, unspecified: Secondary | ICD-10-CM | POA: Insufficient documentation

## 2016-01-02 DIAGNOSIS — I272 Pulmonary hypertension, unspecified: Secondary | ICD-10-CM | POA: Diagnosis present

## 2016-01-02 DIAGNOSIS — G4700 Insomnia, unspecified: Secondary | ICD-10-CM | POA: Insufficient documentation

## 2016-01-02 DIAGNOSIS — R0602 Shortness of breath: Secondary | ICD-10-CM

## 2016-01-02 DIAGNOSIS — J441 Chronic obstructive pulmonary disease with (acute) exacerbation: Secondary | ICD-10-CM | POA: Diagnosis not present

## 2016-01-02 DIAGNOSIS — Z7901 Long term (current) use of anticoagulants: Secondary | ICD-10-CM

## 2016-01-02 LAB — CBC
HCT: 31.8 % — ABNORMAL LOW (ref 36.0–46.0)
Hemoglobin: 9.9 g/dL — ABNORMAL LOW (ref 12.0–15.0)
MCH: 29.8 pg (ref 26.0–34.0)
MCHC: 31.1 g/dL (ref 30.0–36.0)
MCV: 95.8 fL (ref 78.0–100.0)
PLATELETS: 177 10*3/uL (ref 150–400)
RBC: 3.32 MIL/uL — ABNORMAL LOW (ref 3.87–5.11)
RDW: 14.7 % (ref 11.5–15.5)
WBC: 6.8 10*3/uL (ref 4.0–10.5)

## 2016-01-02 LAB — BASIC METABOLIC PANEL
Anion gap: 14 (ref 5–15)
BUN: 45 mg/dL — AB (ref 6–20)
CO2: 31 mmol/L (ref 22–32)
CREATININE: 1.36 mg/dL — AB (ref 0.44–1.00)
Calcium: 9.2 mg/dL (ref 8.9–10.3)
Chloride: 90 mmol/L — ABNORMAL LOW (ref 101–111)
GFR calc Af Amer: 40 mL/min — ABNORMAL LOW (ref >60)
GFR, EST NON AFRICAN AMERICAN: 35 mL/min — AB (ref >60)
GLUCOSE: 109 mg/dL — AB (ref 65–99)
Potassium: 4.4 mmol/L (ref 3.5–5.1)
SODIUM: 135 mmol/L (ref 135–145)

## 2016-01-02 LAB — I-STAT TROPONIN, ED: Troponin i, poc: 0.01 ng/mL (ref 0.00–0.08)

## 2016-01-02 LAB — BRAIN NATRIURETIC PEPTIDE: B NATRIURETIC PEPTIDE 5: 273 pg/mL — AB (ref 0.0–100.0)

## 2016-01-02 MED ORDER — CALCIUM CARBONATE-VITAMIN D 500-200 MG-UNIT PO TABS
1.0000 | ORAL_TABLET | Freq: Every day | ORAL | Status: DC
  Administered 2016-01-03 – 2016-01-04 (×2): 1 via ORAL
  Filled 2016-01-02 (×2): qty 1

## 2016-01-02 MED ORDER — FUROSEMIDE 80 MG PO TABS
80.0000 mg | ORAL_TABLET | Freq: Every day | ORAL | Status: DC
  Administered 2016-01-03 – 2016-01-04 (×2): 80 mg via ORAL
  Filled 2016-01-02 (×2): qty 1

## 2016-01-02 MED ORDER — CARVEDILOL 6.25 MG PO TABS
6.2500 mg | ORAL_TABLET | Freq: Two times a day (BID) | ORAL | Status: DC
  Administered 2016-01-02 – 2016-01-04 (×4): 6.25 mg via ORAL
  Filled 2016-01-02 (×4): qty 1

## 2016-01-02 MED ORDER — DOCUSATE SODIUM 100 MG PO CAPS: 100.0000 mg | ORAL_CAPSULE | Freq: Every evening | ORAL | Status: DC | PRN

## 2016-01-02 MED ORDER — PREDNISONE 20 MG PO TABS
40.0000 mg | ORAL_TABLET | Freq: Every day | ORAL | Status: DC
  Administered 2016-01-03 – 2016-01-04 (×2): 40 mg via ORAL
  Filled 2016-01-02 (×2): qty 2

## 2016-01-02 MED ORDER — TEMAZEPAM 15 MG PO CAPS
15.0000 mg | ORAL_CAPSULE | Freq: Every day | ORAL | Status: DC
  Administered 2016-01-02 – 2016-01-03 (×2): 15 mg via ORAL
  Filled 2016-01-02 (×2): qty 1

## 2016-01-02 MED ORDER — SIMVASTATIN 5 MG PO TABS
5.0000 mg | ORAL_TABLET | Freq: Every day | ORAL | Status: DC
  Administered 2016-01-02 – 2016-01-03 (×2): 5 mg via ORAL
  Filled 2016-01-02 (×3): qty 1

## 2016-01-02 MED ORDER — ALBUTEROL SULFATE (2.5 MG/3ML) 0.083% IN NEBU
5.0000 mg | INHALATION_SOLUTION | Freq: Once | RESPIRATORY_TRACT | Status: AC
  Administered 2016-01-02: 5 mg via RESPIRATORY_TRACT
  Filled 2016-01-02: qty 6

## 2016-01-02 MED ORDER — GABAPENTIN 600 MG PO TABS
600.0000 mg | ORAL_TABLET | ORAL | Status: DC
  Administered 2016-01-02: 600 mg via ORAL
  Filled 2016-01-02 (×2): qty 1

## 2016-01-02 MED ORDER — OXCARBAZEPINE 150 MG PO TABS
75.0000 mg | ORAL_TABLET | Freq: Three times a day (TID) | ORAL | Status: DC
  Administered 2016-01-02 – 2016-01-04 (×5): 75 mg via ORAL
  Filled 2016-01-02 (×7): qty 0.5

## 2016-01-02 MED ORDER — OSELTAMIVIR PHOSPHATE 30 MG PO CAPS
30.0000 mg | ORAL_CAPSULE | Freq: Every day | ORAL | Status: DC
  Administered 2016-01-03 – 2016-01-04 (×2): 30 mg via ORAL
  Filled 2016-01-02 (×2): qty 1

## 2016-01-02 MED ORDER — GABAPENTIN 600 MG PO TABS
600.0000 mg | ORAL_TABLET | ORAL | Status: DC
  Administered 2016-01-02 – 2016-01-04 (×6): 600 mg via ORAL
  Filled 2016-01-02 (×5): qty 1

## 2016-01-02 MED ORDER — ALBUTEROL SULFATE (2.5 MG/3ML) 0.083% IN NEBU: 3.0000 mL | INHALATION_SOLUTION | Freq: Four times a day (QID) | RESPIRATORY_TRACT | Status: DC | PRN

## 2016-01-02 MED ORDER — IPRATROPIUM BROMIDE 0.02 % IN SOLN
0.5000 mg | Freq: Once | RESPIRATORY_TRACT | Status: AC
  Administered 2016-01-02: 0.5 mg via RESPIRATORY_TRACT
  Filled 2016-01-02: qty 2.5

## 2016-01-02 MED ORDER — CETYLPYRIDINIUM CHLORIDE 0.05 % MT LIQD
7.0000 mL | Freq: Two times a day (BID) | OROMUCOSAL | Status: DC
  Administered 2016-01-02 – 2016-01-04 (×4): 7 mL via OROMUCOSAL

## 2016-01-02 MED ORDER — APIXABAN 2.5 MG PO TABS
2.5000 mg | ORAL_TABLET | Freq: Two times a day (BID) | ORAL | Status: DC
  Administered 2016-01-02 – 2016-01-04 (×4): 2.5 mg via ORAL
  Filled 2016-01-02 (×4): qty 1

## 2016-01-02 MED ORDER — IPRATROPIUM-ALBUTEROL 0.5-2.5 (3) MG/3ML IN SOLN
3.0000 mL | Freq: Four times a day (QID) | RESPIRATORY_TRACT | Status: DC
  Administered 2016-01-02 – 2016-01-03 (×5): 3 mL via RESPIRATORY_TRACT
  Filled 2016-01-02 (×5): qty 3

## 2016-01-02 MED ORDER — FERROUS SULFATE 325 (65 FE) MG PO TABS
325.0000 mg | ORAL_TABLET | Freq: Every day | ORAL | Status: DC
  Administered 2016-01-03 – 2016-01-04 (×2): 325 mg via ORAL
  Filled 2016-01-02 (×2): qty 1

## 2016-01-02 MED ORDER — DM-GUAIFENESIN ER 30-600 MG PO TB12
1.0000 | ORAL_TABLET | Freq: Two times a day (BID) | ORAL | Status: DC
  Administered 2016-01-02: 1 via ORAL
  Filled 2016-01-02: qty 1

## 2016-01-02 NOTE — H&P (Signed)
Date: 01/02/2016               Patient Name:  Angela Franco MRN: [id number]  DOB: 11-29-1929 Age / Sex: 82 y.o., female   PCP: Angela Pearson, MD         Medical Service: Internal Medicine Teaching Service         Attending Physician: Dr. Aldine Contes, MD    First Contact: Dr. Liberty Franco Pager: [phone]  Second Contact: Dr. Julious Franco Pager: [phone]       After Hours (After 5p/  First Contact Pager: [phone]  weekends / holidays): Second Contact Pager: [phone]   Chief Complaint: Dyspnea  History of Present Illness:   [name] is an 82 year old woman with a past medical history of atrial fibrillation (CHADS-VASC 5 on Eliquis), non-ischemic cardiomyopathy (CHF EF 40-45/% 03/2015), CKD Stage 3, HTN, chronic LBBB, trigeminal neuralgia, and questionable COPD (no PFTs on file) who presents with shortness of breath and wheezing. She was recently discharged on 1/27 to complete a course of steroids and tamiflu for Influenza. Last night. Yesterday, she had a persistent cough keeping her awake at night and was unable to clear any phelgm. Her oxygen was not effective for her dyspnea symptoms. She denied any fever, chills, night sweats, chest pain, leg swelling, or orthopnea. She did report that more residents are her ALF, Angela Franco, had pneumonia. In the ED, she was satting 94% on Angela Franco, which was her oxygen requirement on discharge. A portable CXR was reassuring. Patient is a non-smoker, non-drinker.   Meds: Current Facility-Administered Medications  Medication Dose Route Frequency Provider Last Rate Last Dose  . albuterol (PROVENTIL) (2.5 MG/3ML) 0.083% nebulizer solution 3 mL  3 mL Inhalation Q6H PRN Angela Herrlich, MD      . antiseptic oral rinse (CPC / CETYLPYRIDINIUM CHLORIDE 0.05%) solution 7 mL  7 mL Mouth Rinse BID Angela Narendra, MD      . apixaban (ELIQUIS) tablet 2.5 mg  2.5 mg Oral BID Angela Herrlich, MD      . Angela Franco ON 01/03/2016] calcium-vitamin D (OSCAL WITH D)  500-200 MG-UNIT per tablet 1 tablet  1 tablet Oral Q breakfast Angela Herrlich, MD      . carvedilol (COREG) tablet 6.25 mg  6.25 mg Oral BID WC Angela Herrlich, MD   6.25 mg at 01/02/16 1800  . dextromethorphan-guaiFENesin (MUCINEX DM) 30-600 MG per 12 hr tablet 1 tablet  1 tablet Oral BID Angela Herrlich, MD      . docusate sodium (COLACE) capsule 100 mg  100 mg Oral QHS PRN Angela Herrlich, MD      . Angela Franco ON 01/03/2016] ferrous sulfate tablet 325 mg  325 mg Oral Q breakfast Angela Herrlich, MD      . Angela Franco ON 01/03/2016] furosemide (LASIX) tablet 80 mg  80 mg Oral Daily Angela Herrlich, MD      . gabapentin (NEURONTIN) tablet 600 mg  600 mg Oral 3 times per day Angela Herrlich, MD   600 mg at 01/02/16 1759  . ipratropium-albuterol (DUONEB) 0.5-2.5 (3) MG/3ML nebulizer solution 3 mL  3 mL Nebulization Q6H Angela Herrlich, MD      . Angela Franco ON 01/03/2016] oseltamivir (TAMIFLU) capsule 30 mg  30 mg Oral Daily Angela Herrlich, MD      . OXcarbazepine (TRILEPTAL) tablet 75 mg  75 mg Oral TID Angela Herrlich, MD      . Angela Franco ON  01/03/2016] predniSONE (DELTASONE) tablet 40 mg  40 mg Oral Q breakfast Angela Herrlich, MD      . simvastatin (ZOCOR) tablet 5 mg  5 mg Oral q1800 Angela Herrlich, MD   5 mg at 01/02/16 1800  . temazepam (RESTORIL) capsule 15 mg  15 mg Oral QHS Angela Herrlich, MD        Allergies: Allergies as of 01/02/2016 - Review Complete 01/02/2016  Allergen Reaction Noted  . Penicillins Hives 06/28/2012   Past Medical History  Diagnosis Date  . COPD (chronic obstructive pulmonary disease) ([place])     Never smoked. Questionable diagnosis.  . Atrial fibrillation ([place])   . Osteopenia   . Insomnia   . Hyponatremia   . Hypertension   . UTI (lower urinary tract infection)   . Bursitis of hip   . Trigeminal neuralgia   . Altered mental status 08/24/2012  . Anemia, with significant drop in H/H 08/24/2012  . CHF (congestive heart failure) ([place])   . Heart disease   . Acute facial pain   . LBBB  (left bundle branch block)   . Nonischemic cardiomyopathy ([place])   . Dyslipidemia   . Kidney failure     Stage 3   Past Surgical History  Procedure Laterality Date  . Appendectomy    . Tonsillectomy and adenoidectomy    . Cardiac catheterization  07/04/2012    normal coronaries, EF 30-35%, elevated R & L heart pressures, reduced CO, mild pulm HTN (Dr. K. Mali Franco)  . Transthoracic echocardiogram  2013    mild conc LVH, RV mildly dilated; LA severely dilated; RA mod dilated; mild-mod mitral annular calcif, calcif of anterior & posterior MV leaflet, mod MR; mod-severe TR with RSVP 30-52mmHg; mild calcif of AV leaflets and mod aortic regurg; mild pulm valve regurg; pericardial effusion (right)  . Nm myocar perf wall motion  2011    lexiscan myoview - normal LV systolic function w/normal wall motion, no evidence of scar/ischemia  . Left and right heart catheterization with coronary angiogram N/A 07/04/2012    Procedure: LEFT AND RIGHT HEART CATHETERIZATION WITH CORONARY ANGIOGRAM;  Surgeon: Angela Casino, MD;  Location: [facility] CATH LAB;  Service: Cardiovascular;  Laterality: N/A;   Family History  Problem Relation Age of Onset  . Stroke Father   . Heart attack Father   . Heart attack Brother   . Heart attack Brother   . Alzheimer's disease Mother    Social History   Social History  . Marital Status: Widowed    Spouse Name: N/A  . Number of Children: 3  . Years of Education: 12   Occupational History  . Not on file.   Social History Main Topics  . Smoking status: Never Smoker   . Smokeless tobacco: Never Used  . Alcohol Use: No  . Drug Use: No  . Sexual Activity: Not on file   Other Topics Concern  . Not on file   Social History Narrative   Patient is widowed.    Patient has 3 children.    Patient is retired.     Review of Systems: Al systems negative except per HPI  Physical Exam: Blood pressure 145/59, pulse 85, temperature 98 F (36.7 C), temperature source Oral,  resp. rate 18, height 5\' 4"  (1.626 m), weight 134 lb (60.782 kg), SpO2 97 %. HEENT: NCAT, EOMI, PERRL, Sclerae ancicteric, moist mucous membranes, no tonsillar erythema or exudate, no JVD, no cervical adenopathy Cardiovascular: irregularly irregular rhythm,  regular rate. No murmurs, rubs, or gallops Pulmonary: Rhonchorous sounds and wheezes in all fields. No audible crackles.  Abdominal: Soft. NT/ND. Normal BS Back: Bruising noted on upper right back Extremities: No clubbing, cyanosis, or edema Skin: Warm and dry Neurological: AAOx3.  Tongue midline, face symmetric.  Psychiatric: Normal behavior and affect.   Lab results: Basic Metabolic Panel:  Recent Labs  12/31/15 0426 01/02/16 1302  NA 133* 135  K 3.9 4.4  CL 92* 90*  CO2 31 31  GLUCOSE 89 109*  BUN 40* 45*  CREATININE 1.43* 1.36*  CALCIUM 8.5* 9.2   CBC:  Recent Labs  12/31/15 0426 01/02/16 1302  WBC 3.1* 6.8  HGB 9.2* 9.9*  HCT 29.1* 31.8*  MCV 96.4 95.8  PLT 156 177    Imaging results:  Dg Chest Portable 1 View  01/02/2016  CLINICAL DATA:  Productive cough and shortness of breath. EXAM: PORTABLE CHEST 1 VIEW COMPARISON:  12/29/2015 FINDINGS: Persisting cardiomegaly. Atherosclerosis of the aorta again noted. Venous hypertension without frank edema. No infiltrate, collapse or effusion. No acute bone finding. IMPRESSION: Cardiomegaly and pulmonary venous hypertension. No frank edema. No infiltrate or collapse. Electronically Signed   By: Nelson Chimes M.D.   On: 01/02/2016 13:09   EKG: Atrial fibrillation. No significant change from prior.  Assessment & Plan by Problem:  Influenza: She is flu positive from the last admission. No significant leukocytosis or CXR findings consistent with a superimposed pneumonia. Will be managed with supportive care. She has a questionable diagnosis of COPD, so it is possible there is a concomitant COPD exacerbation - Albuterol nebulizer every 6 hours pran - Duonebs q6h -  Prednisone 40 mg daily - Finish Oseltamivir 30 mg BID (renally dosed) for 5 days (last dose 1/30)  Questionable COPD: Nonsmoker, no PFTs on file.  - Respiratory therapy as above.   Atrial Fibrillation (CHADS-VASc 5): Currently in Afib on exam and telemetry. - Eliquis 2.5 mg BID - Carvedilol 6.25 mg BID  CHF (EF 35-40%): Low suspicion for CHF exacerbation at this time. At dry weight. - Furosemide 80 mg daily - Daily weights - Strict I's O's - K-dur 20 mEq daily  CKD Stage 3: Creatinine near baseline of 1.4  Trigeminal Neuralgia: - Trileptal 75 mg TID - Gabapenting 600 mg TID - Oxycodone 2.5 mg BID - Temazepam 15 mg QHS  Iron Deficiency Anemia: Hgb 9.9 - Ferrous sulfate 325 mg daily  DVT PPx: Eliquis as above Code Status: DNR  Dispo: Disposition is deferred at this time, awaiting improvement of current medical problems. Anticipated discharge in approximately 1-2 day(s).   The patient does have a current PCP Angela Pearson, MD) and does not need an [facility] hospital follow-up appointment after discharge.  The patient does have transportation limitations that hinder transportation to clinic appointments.  Signed: Liberty Handy, MD 01/02/2016, 8:06 PM

## 2016-01-02 NOTE — ED Notes (Signed)
Per ems--Pt diagnosed with flu on 1/25 with admission. Pt reports worsening of SOB worse on exertion and productive cough. 5mg  of albuterol. Cpap in route pt requested to be removed. Pt is alert and ox4. Pt has labored respirations but able to speak in complete sentences. Wheezing and rales noted in all lung fields. Pt denies any pain.

## 2016-01-02 NOTE — ED Provider Notes (Signed)
CSN: [id number]     Arrival date & time 01/02/16  1227 History   First MD Initiated Contact with Patient 01/02/16 1237     Chief Complaint  Patient presents with  . Shortness of Breath  . Influenza     (Consider location/radiation/quality/duration/timing/severity/associated sxs/prior Treatment) HPI Comments: Patient with history of atrial fibrillation, recent admission and diagnosis of influenza A, discharged home 2 days ago on oxygen -- presents with worsening shortness of breath and cough. EMS was called due to severe symptoms. She was given a trial of BiPAP but was not able to tolerate. Patient is comfortable on oxygen at the current time. She reports no worsening fevers. Complains of persistent coughing. No chest pain. No lower extremity swelling. She is continuing to take Tamiflu and prednisone. Questionable history of COPD, however this has not been definitively diagnosed. The onset of this condition was acute. The course is constant. Aggravating factors: exertion. Alleviating factors: none.    The history is provided by the patient and medical records.    Past Medical History  Diagnosis Date  . COPD (chronic obstructive pulmonary disease) ([place])     Never smoked. Questionable diagnosis.  . Atrial fibrillation ([place])   . Osteopenia   . Insomnia   . Hyponatremia   . Hypertension   . UTI (lower urinary tract infection)   . Bursitis of hip   . Trigeminal neuralgia   . Altered mental status 08/24/2012  . Anemia, with significant drop in H/H 08/24/2012  . CHF (congestive heart failure) ([place])   . Heart disease   . Acute facial pain   . LBBB (left bundle branch block)   . Nonischemic cardiomyopathy ([place])   . Dyslipidemia   . Kidney failure     Stage 3   Past Surgical History  Procedure Laterality Date  . Appendectomy    . Tonsillectomy and adenoidectomy    . Cardiac catheterization  07/04/2012    normal coronaries, EF 30-35%, elevated R & L heart pressures, reduced CO, mild pulm  HTN (Dr. K. Mali Hilty)  . Transthoracic echocardiogram  2013    mild conc LVH, RV mildly dilated; LA severely dilated; RA mod dilated; mild-mod mitral annular calcif, calcif of anterior & posterior MV leaflet, mod MR; mod-severe TR with RSVP 30-70mmHg; mild calcif of AV leaflets and mod aortic regurg; mild pulm valve regurg; pericardial effusion (right)  . Nm myocar perf wall motion  2011    lexiscan myoview - normal LV systolic function w/normal wall motion, no evidence of scar/ischemia  . Left and right heart catheterization with coronary angiogram N/A 07/04/2012    Procedure: LEFT AND RIGHT HEART CATHETERIZATION WITH CORONARY ANGIOGRAM;  Surgeon: Pixie Casino, MD;  Location: [facility] CATH LAB;  Service: Cardiovascular;  Laterality: N/A;   Family History  Problem Relation Age of Onset  . Stroke Father   . Heart attack Father   . Heart attack Brother   . Heart attack Brother   . Alzheimer's disease Mother    Social History  Substance Use Topics  . Smoking status: Never Smoker   . Smokeless tobacco: Never Used  . Alcohol Use: No   OB History    No data available     Review of Systems  Constitutional: Negative for fever.  HENT: Negative for rhinorrhea and sore throat.   Eyes: Negative for redness.  Respiratory: Positive for cough, shortness of breath and wheezing.   Cardiovascular: Negative for chest pain and leg swelling.  Gastrointestinal: Negative for  nausea, vomiting, abdominal pain and diarrhea.  Genitourinary: Negative for dysuria.  Musculoskeletal: Negative for myalgias.  Skin: Negative for rash.  Neurological: Negative for headaches.    Allergies  Penicillins  Home Medications   Prior to Admission medications   Medication Sig Start Date End Date Taking? Authorizing Provider  albuterol (PROVENTIL HFA;VENTOLIN HFA) 108 (90 Base) MCG/ACT inhaler Inhale 2 puffs into the lungs every 6 (six) hours as needed for wheezing or shortness of breath. 12/30/15   Liberty Handy, MD   apixaban (ELIQUIS) 2.5 MG TABS tablet Take 1 tablet (2.5 mg total) by mouth 2 (two) times daily. 11/16/15   Pixie Casino, MD  calcium-vitamin D (OSCAL WITH D) 500-200 MG-UNIT tablet Take 1 tablet by mouth daily with breakfast.    Historical Provider, MD  carvedilol (COREG) 6.25 MG tablet Take 1 tablet (6.25 mg total) by mouth 2 (two) times daily with a meal. 12/08/15   Pixie Casino, MD  docusate sodium 100 MG CAPS Take 200 mg by mouth at bedtime as needed. Patient taking differently: Take 100 mg by mouth at bedtime as needed (constipation).  08/26/12   Orson Eva, MD  ferrous sulfate 325 (65 FE) MG tablet Take 325 mg by mouth daily with breakfast.    Historical Provider, MD  furosemide (LASIX) 80 MG tablet Take 1 tablet (80 mg total) by mouth daily. 11/10/15 09/21/18  Erlene Quan, PA-C  gabapentin (NEURONTIN) 600 MG tablet Take 1 tablet (600 mg total) by mouth 3 (three) times daily. Please dose at 8am, 2pm and 8pm 06/14/15   Marcial Pacas, MD  oseltamivir (TAMIFLU) 30 MG capsule Take 1 capsule (30 mg total) by mouth at bedtime. 12/30/15   Liberty Handy, MD  OXcarbazepine (TRILEPTAL) 150 MG tablet Half tablets twice a day,  may increase to one tablets twice a day in one week Patient taking differently: Take 75 mg by mouth 3 (three) times daily. Take 1/2 tablet (75 mg) at 0600, 1200, 1800. 10/13/15   Marcial Pacas, MD  oxyCODONE (OXY IR/ROXICODONE) 5 MG immediate release tablet 1/2 tab po as needed q8 hours Patient taking differently: Take 2.5-5 mg by mouth 3 (three) times daily. Take 1/2 tablet (2.5 mg) in the am, at lunch and Take 1 tablet (5 mg) at bedtime. 10/26/15   Marcial Pacas, MD  potassium chloride SA (K-DUR,KLOR-CON) 20 MEQ tablet Take 20 mEq by mouth daily.    Historical Provider, MD  predniSONE (DELTASONE) 20 MG tablet Take 2 tablets (40 mg total) by mouth daily with breakfast. 12/31/15 01/03/16  Liberty Handy, MD  simvastatin (ZOCOR) 10 MG tablet Take 0.5 tablets (5 mg total) by mouth daily at 6 PM.  11/24/15   Pixie Casino, MD  temazepam (RESTORIL) 15 MG capsule Take 15 mg by mouth at bedtime.     Historical Provider, MD   BP 129/82 mmHg  Pulse 78  Temp(Src) 98 F (36.7 C) (Oral)  Resp 15  Ht 5\' 4"  (1.626 m)  Wt 60.782 kg  BMI 22.99 kg/m2  SpO2 95%   Physical Exam  Constitutional: She appears well-developed and well-nourished.  HENT:  Head: Normocephalic and atraumatic.  Eyes: Conjunctivae are normal. Right eye exhibits no discharge. Left eye exhibits no discharge.  Neck: Normal range of motion. Neck supple.  Cardiovascular: Normal rate, regular rhythm and normal heart sounds.   No murmur heard. Pulmonary/Chest: Effort normal. She has wheezes. She has rhonchi. She has no rales. She exhibits no tenderness.  Bruising noted right lateral  back.   Abdominal: Soft. There is no tenderness. There is no rebound and no guarding.  Musculoskeletal: She exhibits no edema or tenderness.  Neurological: She is alert.  Skin: Skin is warm and dry.  Psychiatric: She has a normal mood and affect.  Nursing note and vitals reviewed.   ED Course  Procedures (including critical care time) Labs Review Labs Reviewed  BASIC METABOLIC PANEL - Abnormal; Notable for the following:    Chloride 90 (*)    Glucose, Bld 109 (*)    BUN 45 (*)    Creatinine, Ser 1.36 (*)    GFR calc non Af Amer 35 (*)    GFR calc Af Amer 40 (*)    All other components within normal limits  CBC - Abnormal; Notable for the following:    RBC 3.32 (*)    Hemoglobin 9.9 (*)    HCT 31.8 (*)    All other components within normal limits  BRAIN NATRIURETIC PEPTIDE - Abnormal; Notable for the following:    B Natriuretic Peptide 273.0 (*)    All other components within normal limits  I-STAT TROPOININ, ED    Imaging Review Dg Chest Portable 1 View  01/02/2016  CLINICAL DATA:  Productive cough and shortness of breath. EXAM: PORTABLE CHEST 1 VIEW COMPARISON:  12/29/2015 FINDINGS: Persisting cardiomegaly. Atherosclerosis  of the aorta again noted. Venous hypertension without frank edema. No infiltrate, collapse or effusion. No acute bone finding. IMPRESSION: Cardiomegaly and pulmonary venous hypertension. No frank edema. No infiltrate or collapse. Electronically Signed   By: Nelson Chimes M.D.   On: 01/02/2016 13:09   I have personally reviewed and evaluated these images and lab results as part of my medical decision-making.   EKG Interpretation   Date/Time:  Sunday January 02 2016 12:33:38 EST Ventricular Rate:  75 PR Interval:    QRS Duration: 135 QT Interval:  439 QTC Calculation: 490 R Axis:   -51 Text Interpretation:  Atrial fibrillation Nonspecific IVCD with LAD Left  ventricular hypertrophy Anterior infarct, old Abnormal T, consider  ischemia, lateral leads Baseline wander in lead(s) V1 V2 No significant  change since last tracing Confirmed by KNOTT MD, DANIEL [id number]) on  01/02/2016 12:48:45 PM       1:49 PM Patient seen and examined. Work-up initiated. Medications ordered.   Vital signs reviewed and are as follows: BP 129/82 mmHg  Pulse 78  Temp(Src) 98 F (36.7 C) (Oral)  Resp 15  Ht 5\' 4"  (1.626 m)  Wt 60.782 kg  BMI 22.99 kg/m2  SpO2 95%  2:28 PM Patient discussed with and seen by Dr. Laneta Simmers. Patient continues to be very symptomatic but not hypoxic. Will ask IMTS to re-eval.   MDM   Final diagnoses:  Shortness of breath  Influenza A   Admit.     Carlisle Cater, PA-C 01/02/16 1600  Leo Grosser, MD 01/03/16 1309

## 2016-01-02 NOTE — Progress Notes (Signed)
Admission note:  Arrival Method: Stretcher from ED Mental Orientation: A&OX4 Telemetry: [phone]. CCMD notified Assessment: See flowsheets Skin: Dry; intact, Bruises on RU back, bilateral arms arms, and L buttocks.  IV: R forearm NSL Pain: Denies Tubes: N/A Safety Measures: Bed in lowest position, call light within reach, yellow socks placed, bed alarm activated, pt in camera room. Fall Prevention Safety Plan: Reviewed with pt.  Admission Screening: Completed 6700 Orientation: Patient has been oriented to the unit, staff and to the room.  Orders have been reviewed and implemented. MD paged to make aware of pt's arrival. Will continue to assess and monitor pt.  Angela Franco

## 2016-01-03 ENCOUNTER — Observation Stay (HOSPITAL_COMMUNITY): Payer: Medicare Other

## 2016-01-03 DIAGNOSIS — J111 Influenza due to unidentified influenza virus with other respiratory manifestations: Secondary | ICD-10-CM

## 2016-01-03 DIAGNOSIS — I4891 Unspecified atrial fibrillation: Secondary | ICD-10-CM

## 2016-01-03 DIAGNOSIS — D509 Iron deficiency anemia, unspecified: Secondary | ICD-10-CM

## 2016-01-03 DIAGNOSIS — G500 Trigeminal neuralgia: Secondary | ICD-10-CM

## 2016-01-03 DIAGNOSIS — N183 Chronic kidney disease, stage 3 (moderate): Secondary | ICD-10-CM

## 2016-01-03 DIAGNOSIS — I130 Hypertensive heart and chronic kidney disease with heart failure and stage 1 through stage 4 chronic kidney disease, or unspecified chronic kidney disease: Secondary | ICD-10-CM | POA: Diagnosis not present

## 2016-01-03 DIAGNOSIS — J1089 Influenza due to other identified influenza virus with other manifestations: Secondary | ICD-10-CM | POA: Diagnosis not present

## 2016-01-03 DIAGNOSIS — R0602 Shortness of breath: Secondary | ICD-10-CM | POA: Diagnosis not present

## 2016-01-03 DIAGNOSIS — I509 Heart failure, unspecified: Secondary | ICD-10-CM

## 2016-01-03 LAB — CULTURE, BLOOD (ROUTINE X 2)
Culture: NO GROWTH
Culture: NO GROWTH

## 2016-01-03 MED ORDER — DM-GUAIFENESIN ER 30-600 MG PO TB12
1.0000 | ORAL_TABLET | Freq: Two times a day (BID) | ORAL | Status: DC
  Administered 2016-01-03 – 2016-01-04 (×3): 1 via ORAL
  Filled 2016-01-03 (×3): qty 1

## 2016-01-03 MED ORDER — IPRATROPIUM-ALBUTEROL 0.5-2.5 (3) MG/3ML IN SOLN
3.0000 mL | Freq: Three times a day (TID) | RESPIRATORY_TRACT | Status: DC
  Administered 2016-01-04 (×2): 3 mL via RESPIRATORY_TRACT
  Filled 2016-01-03 (×2): qty 3

## 2016-01-03 NOTE — Progress Notes (Signed)
Subjective:  The patient reported that her dyspnea symptoms have improved. She did reports feeling "uncomfortable" however and feeling restless.   Objective: Vital signs in last 24 hours: Filed Vitals:   01/02/16 2125 01/03/16 0301 01/03/16 0435 01/03/16 0900  BP:   121/56   Pulse: 70  60   Temp:   97.9 F (36.6 C)   TempSrc:      Resp: 18  20   Height:      Weight:      SpO2:  96% 97% 98%   Weight change:   Intake/Output Summary (Last 24 hours) at 01/03/16 1150 Last data filed at 01/03/16 0923  Gross per 24 hour  Intake    240 ml  Output   1270 ml  Net  -1030 ml   Physical Exam General: Lying in bed, NAD HEENT: NCAT,  Sclerae ancicteric, Cardiovascular: irregularly irregular rhythm, regular rate. No murmurs, rubs, or gallops Pulmonary: Rhonchorous breath sounds but improved from yesterday.  Abdominal: Soft. NTND. Normal BS Extremities: No clubbing, edema, or cyanosis Psychiatric: Normal behavior and affect.  Lab Results: Basic Metabolic Panel:  Recent Labs Lab 12/31/15 0426 01/02/16 1302  NA 133* 135  K 3.9 4.4  CL 92* 90*  CO2 31 31  GLUCOSE 89 109*  BUN 40* 45*  CREATININE 1.43* 1.36*  CALCIUM 8.5* 9.2   CBC:  Recent Labs Lab 12/29/15 1100  12/31/15 0426 01/02/16 1302  WBC 4.1  < > 3.1* 6.8  NEUTROABS 1.8  --   --   --   HGB 9.9*  < > 9.2* 9.9*  HCT 29.9*  < > 29.1* 31.8*  MCV 94.9  < > 96.4 95.8  PLT 138*  < > 156 177  < > = values in this interval not displayed.   Studies/Results: Dg Chest 2 View  01/03/2016  CLINICAL DATA:  Three days of cough and shortness of breath ; history of cardiomyopathy, COPD. EXAM: CHEST  2 VIEW COMPARISON:  Portable chest x-ray of January 02, 2016 FINDINGS: The lungs are mildly hyperinflated. There is no focal infiltrate. Subtle interstitial density is present in the right infrahilar region. There is no alveolar infiltrate. There is no pleural effusion or pneumothorax. There is minimal apical pleural thickening  bilaterally. The cardiac silhouette is mildly enlarged but stable. The pulmonary vascularity is normal. The mediastinum is normal in width. The observed bony thorax exhibits no acute abnormality. There are degenerative changes of both shoulders. IMPRESSION: Hyperinflation consistent with reactive airway disease. Patchy subsegmental atelectasis or infiltrate in the right infrahilar region. There is no alveolar pneumonia. Interval decrease in pulmonary interstitial edema. Electronically Signed   By: David  Martinique M.D.   On: 01/03/2016 07:37   Dg Chest Portable 1 View  01/02/2016  CLINICAL DATA:  Productive cough and shortness of breath. EXAM: PORTABLE CHEST 1 VIEW COMPARISON:  12/29/2015 FINDINGS: Persisting cardiomegaly. Atherosclerosis of the aorta again noted. Venous hypertension without frank edema. No infiltrate, collapse or effusion. No acute bone finding. IMPRESSION: Cardiomegaly and pulmonary venous hypertension. No frank edema. No infiltrate or collapse. Electronically Signed   By: Nelson Chimes M.D.   On: 01/02/2016 13:09   Medications: I have reviewed the patient's current medications. Scheduled Meds: . antiseptic oral rinse  7 mL Mouth Rinse BID  . apixaban  2.5 mg Oral BID  . calcium-vitamin D  1 tablet Oral Q breakfast  . carvedilol  6.25 mg Oral BID WC  . dextromethorphan-guaiFENesin  1 tablet Oral BID  .  ferrous sulfate  325 mg Oral Q breakfast  . furosemide  80 mg Oral Daily  . gabapentin  600 mg Oral 3 times per day  . ipratropium-albuterol  3 mL Nebulization Q6H  . oseltamivir  30 mg Oral Daily  . OXcarbazepine  75 mg Oral TID  . predniSONE  40 mg Oral Q breakfast  . simvastatin  5 mg Oral q1800  . temazepam  15 mg Oral QHS   Continuous Infusions:  PRN Meds:.albuterol, docusate sodium Assessment/Plan:  Influenza: She is flu positive from the last admission. No significant leukocytosis or CXR findings consistent with a superimposed pneumonia. Will be managed with supportive  care. She has a questionable diagnosis of COPD, so it is possible there is a concomitant COPD exacerbation. - Albuterol nebulizer every 6 hours pran - Duonebs q6h - Prednisone 40 mg daily - Finish Oseltamivir 30 mg BID today - Mucinex  Questionable COPD: Nonsmoker, no PFTs on file.  - Respiratory therapy as above.   Atrial Fibrillation (CHADS-VASc 5): Currently in Afib on exam and telemetry. - Eliquis 2.5 mg BID - Carvedilol 6.25 mg BID  CHF (EF 35-40%): Low suspicion for CHF exacerbation at this time. At dry weight. - Furosemide 80 mg daily - Daily weights - Strict I's O's - K-dur 20 mEq daily  CKD Stage 3: Creatinine near baseline of 1.4  Trigeminal Neuralgia: - Trileptal 75 mg TID - Gabapenting 600 mg TID - Oxycodone 2.5 mg BID - Temazepam 15 mg QHS  Iron Deficiency Anemia: Hgb 9.9 - Ferrous sulfate 325 mg daily  DVT PPx: Eliquis as above  Dispo: Anticipated discharge to [facility], today or tomorrow depending on improvement in symptoms.   The patient does have a current PCP Marylynn Pearson, MD) and does not need an [facility] hospital follow-up appointment after discharge.  The patient does have transportation limitations that hinder transportation to clinic appointments.  .Services Needed at time of discharge: Y = Yes, Blank = No PT:   OT:   RN:   Equipment:   Other: Home Oxygen      Liberty Handy, MD 01/03/2016, 11:50 AM

## 2016-01-03 NOTE — Progress Notes (Signed)
Patient seen and examined. Case d/w residents in detail.  HPI: Patient is an 82 y/o female with PMH of afib on Eliquis, non ischemicCM, CKD stage 3 , HTN and ? COPD who was recently discharged from [facility] hospital after being diagnosed with the flu p/w SOB and wheezing. She developed worsening SOB after leaving hospital and had assoc wheezing with no relief with oxygen. No fevers/chills, no CP, no edema, no n/v, no diarrhea, no syncope. She still complains of SOB today but improved.  Physical Exam: Gen: AAO*3. NAD CVS: irregularly irregular Lungs: b/l exp wheeze + Abd: soft, non tender, BS + Ext: no edema  Assessment and Plan: Patient was recently diagnosed with the flu. No new evidence of superimposed PNA. Her worsening SOB may be attributable to COPD exacerbation. Will c/w nebs, prednisone and complete course of oseltamivir. C/w mucinex for cough.  No further w/u for now. Will monitor closely.

## 2016-01-03 NOTE — Discharge Instructions (Addendum)
Ms. Tremble, you were in the hospital for influenza. You had recurrent symptoms, perhaps because of your underlying COPD. Please drink plenty of fluids and make sure family and visitors wear face masks when they enter your room. If you have a fever, chills, night sweats, or worsening shortness of breath, you may be developing a bacterial pneumonia. This can sometimes happen after people get flu. In this case, please seek medical attention. You were evaluated for a pneumonia while you were here and there was no evidence of pneumonia.  As mentioned, you may have a mild COPD exacerbation as well. However, given that you are nonsmoker and have never had pulmonary testing, this should be definitively diagnosed. I encourage you to obtain pulmonary function testings (PFTs).   You will begin to taper off of steroids. Tomorrow, you will take two 10 mg pills of prednisone in the morning with breakfast for 3 days. Then you will take one 10 mg prednisone pill for 3 days, then stop. I have prescribed home oxygen, a nebulizer, and an incentive spirometer. I have also prescribed Mucinex for your cough. Influenza, Adult Influenza (flu) is an infection in the mouth, nose, and throat (respiratory tract) caused by a virus. The flu can make you feel very ill. Influenza spreads easily from person to person (contagious).  HOME CARE   Only take medicines as told by your doctor.  Use a cool mist humidifier to make breathing easier.  Get plenty of rest until your fever goes away. This usually takes 3 to 4 days.  Drink enough fluids to keep your pee (urine) clear or pale yellow.  Cover your mouth and nose when you cough or sneeze.  Wash your hands well to avoid spreading the flu.  Stay home from work or school until your fever has been gone for at least 1 full day.  Get a flu shot every year. GET HELP RIGHT AWAY IF:   You have trouble breathing or feel short of breath.  Your skin or nails turn blue.  You have  severe neck pain or stiffness.  You have a severe headache, facial pain, or earache.  Your fever gets worse or keeps coming back.  You feel sick to your stomach (nauseous), throw up (vomit), or have watery poop (diarrhea).  You have chest pain.  You have a deep cough that gets worse, or you cough up more thick spit (mucus). MAKE SURE YOU:   Understand these instructions.  Will watch your condition.  Will get help right away if you are not doing well or get worse.   This information is not intended to replace advice given to you by your health care provider. Make sure you discuss any questions you have with your health care provider.   Document Released: 08/29/2008 Document Revised: 12/11/2014 Document Reviewed: 02/19/2012 Elsevier Interactive Patient Education 2016 [place] on my medicine - ELIQUIS (apixaban)  This medication education was reviewed with me or my healthcare representative as part of my discharge preparation.  The pharmacist that spoke with me during my hospital stay was:  Romona Curls, [facility]  Why was Eliquis prescribed for you? Eliquis was prescribed for you to reduce the risk of a blood clot forming that can cause a stroke if you have a medical condition called atrial fibrillation (a type of irregular heartbeat).  What do You need to know about Eliquis ? Take your Eliquis TWICE DAILY - one tablet in the morning and one  tablet in the evening with or without food. If you have difficulty swallowing the tablet whole please discuss with your pharmacist how to take the medication safely.  Take Eliquis exactly as prescribed by your doctor and DO NOT stop taking Eliquis without talking to the doctor who prescribed the medication.  Stopping may increase your risk of developing a stroke.  Refill your prescription before you run out.  After discharge, you should have regular check-up appointments with your healthcare provider that is  prescribing your Eliquis.  In the future your dose may need to be changed if your kidney function or weight changes by a significant amount or as you get older.  What do you do if you miss a dose? If you miss a dose, take it as soon as you remember on the same day and resume taking twice daily.  Do not take more than one dose of ELIQUIS at the same time to make up a missed dose.  Important Safety Information A possible side effect of Eliquis is bleeding. You should call your healthcare provider right away if you experience any of the following: ? Bleeding from an injury or your nose that does not stop. ? Unusual colored urine (red or dark brown) or unusual colored stools (red or black). ? Unusual bruising for unknown reasons. ? A serious fall or if you hit your head (even if there is no bleeding).  Some medicines may interact with Eliquis and might increase your risk of bleeding or clotting while on Eliquis. To help avoid this, consult your healthcare provider or pharmacist prior to using any new prescription or non-prescription medications, including herbals, vitamins, non-steroidal anti-inflammatory drugs (NSAIDs) and supplements.  This website has more information on Eliquis (apixaban): http://www.eliquis.com/eliquis/home

## 2016-01-04 DIAGNOSIS — I130 Hypertensive heart and chronic kidney disease with heart failure and stage 1 through stage 4 chronic kidney disease, or unspecified chronic kidney disease: Secondary | ICD-10-CM | POA: Diagnosis not present

## 2016-01-04 DIAGNOSIS — J1089 Influenza due to other identified influenza virus with other manifestations: Secondary | ICD-10-CM | POA: Diagnosis not present

## 2016-01-04 DIAGNOSIS — I4891 Unspecified atrial fibrillation: Secondary | ICD-10-CM | POA: Diagnosis not present

## 2016-01-04 DIAGNOSIS — R0602 Shortness of breath: Secondary | ICD-10-CM | POA: Diagnosis not present

## 2016-01-04 DIAGNOSIS — J111 Influenza due to unidentified influenza virus with other respiratory manifestations: Secondary | ICD-10-CM | POA: Diagnosis not present

## 2016-01-04 LAB — BASIC METABOLIC PANEL
ANION GAP: 10 (ref 5–15)
BUN: 45 mg/dL — ABNORMAL HIGH (ref 6–20)
CALCIUM: 8.8 mg/dL — AB (ref 8.9–10.3)
CO2: 33 mmol/L — AB (ref 22–32)
Chloride: 86 mmol/L — ABNORMAL LOW (ref 101–111)
Creatinine, Ser: 1.47 mg/dL — ABNORMAL HIGH (ref 0.44–1.00)
GFR calc Af Amer: 37 mL/min — ABNORMAL LOW (ref >60)
GFR calc non Af Amer: 32 mL/min — ABNORMAL LOW (ref >60)
GLUCOSE: 86 mg/dL (ref 65–99)
Potassium: 3.6 mmol/L (ref 3.5–5.1)
Sodium: 129 mmol/L — ABNORMAL LOW (ref 135–145)

## 2016-01-04 LAB — CBC
HEMATOCRIT: 29.6 % — AB (ref 36.0–46.0)
Hemoglobin: 9.6 g/dL — ABNORMAL LOW (ref 12.0–15.0)
MCH: 30.8 pg (ref 26.0–34.0)
MCHC: 32.4 g/dL (ref 30.0–36.0)
MCV: 94.9 fL (ref 78.0–100.0)
Platelets: 202 10*3/uL (ref 150–400)
RBC: 3.12 MIL/uL — ABNORMAL LOW (ref 3.87–5.11)
RDW: 14.4 % (ref 11.5–15.5)
WBC: 6 10*3/uL (ref 4.0–10.5)

## 2016-01-04 MED ORDER — OXYCODONE HCL 5 MG PO TABS: 2.5000 mg | ORAL_TABLET | Freq: Two times a day (BID) | ORAL | Status: DC | PRN

## 2016-01-04 MED ORDER — PREDNISONE 10 MG PO TABS: 10.0000 mg | ORAL_TABLET | Freq: Every day | ORAL | Status: DC

## 2016-01-04 MED ORDER — IPRATROPIUM-ALBUTEROL 0.5-2.5 (3) MG/3ML IN SOLN: 3.0000 mL | Freq: Four times a day (QID) | RESPIRATORY_TRACT | Status: DC | PRN

## 2016-01-04 MED ORDER — GUAIFENESIN ER 600 MG PO TB12: 600.0000 mg | ORAL_TABLET | Freq: Two times a day (BID) | ORAL | Status: DC

## 2016-01-04 NOTE — Discharge Summary (Signed)
Name: Angela Franco MRN: [id number] DOB: 11-18-1929 82 y.o. PCP: Marylynn Pearson, MD  Date of Admission: 01/02/2016 12:27 PM Date of Discharge: 01/04/2016 Attending Physician: Aldine Contes, MD  Discharge Diagnosis: 1. Influenza 2. Atrial Fibrillation 3. CHF 4. CKD Stage 3 5. Trigeminal Neuralgia 6. Iron Deficiency Anemia 7. HTN  Discharge Medications:   Medication List    STOP taking these medications        albuterol 108 (90 Base) MCG/ACT inhaler  Commonly known as:  PROVENTIL HFA;VENTOLIN HFA     oseltamivir 30 MG capsule  Commonly known as:  TAMIFLU      TAKE these medications        apixaban 2.5 MG Tabs tablet  Commonly known as:  ELIQUIS  Take 1 tablet (2.5 mg total) by mouth 2 (two) times daily.     calcium-vitamin D 500-200 MG-UNIT tablet  Commonly known as:  OSCAL WITH D  Take 1 tablet by mouth daily with breakfast.     carvedilol 6.25 MG tablet  Commonly known as:  COREG  Take 1 tablet (6.25 mg total) by mouth 2 (two) times daily with a meal.     DSS 100 MG Caps  Take 200 mg by mouth at bedtime as needed.     ferrous sulfate 325 (65 FE) MG tablet  Take 325 mg by mouth daily with breakfast.     furosemide 80 MG tablet  Commonly known as:  LASIX  Take 1 tablet (80 mg total) by mouth daily.     gabapentin 600 MG tablet  Commonly known as:  NEURONTIN  Take 1 tablet (600 mg total) by mouth 3 (three) times daily. Please dose at 8am, 2pm and 8pm     guaiFENesin 600 MG 12 hr tablet  Commonly known as:  MUCINEX  Take 1 tablet (600 mg total) by mouth 2 (two) times daily.     ipratropium-albuterol 0.5-2.5 (3) MG/3ML Soln  Commonly known as:  DUONEB  Take 3 mLs by nebulization every 6 (six) hours as needed.     OXcarbazepine 150 MG tablet  Commonly known as:  TRILEPTAL  Half tablets twice a day,  may increase to one tablets twice a day in one week     oxyCODONE 5 MG immediate release tablet  Commonly known as:  Oxy IR/ROXICODONE  Take 0.5  tablets (2.5 mg total) by mouth 2 (two) times daily as needed. Take 1/2 tablet (2.5 mg) in the am, at lunch and Take 1 tablet (5 mg) at bedtime.     potassium chloride SA 20 MEQ tablet  Commonly known as:  K-DUR,KLOR-CON  Take 20 mEq by mouth daily.     predniSONE 10 MG tablet  Commonly known as:  DELTASONE  Take 1-2 tablets (10-20 mg total) by mouth daily with breakfast. Take two pills every day for 3 days, then take one pill every day for 3 days, then stop.     simvastatin 10 MG tablet  Commonly known as:  ZOCOR  Take 0.5 tablets (5 mg total) by mouth daily at 6 PM.     temazepam 15 MG capsule  Commonly known as:  RESTORIL  Take 15 mg by mouth at bedtime.        Disposition and follow-up:   AngelaSherrine Franco was discharged from [facility] in stable condition.  At the hospital follow up visit please address:  1.  Patient was admitted for recurrent influenza and COPD symptoms. She was discharged on a steroid  taper and ipratropium-albuterol nebulizers. Ascertain resolution of symptoms and consider PFTs to confirm diagnosis of COPD  2. Patient had Sodium of 129 on discharge, she was asymptomatic. It was deemed likely due to Trileptal.   2.  Labs / imaging needed at time of follow-up: BMET to check sodium  3.  Pending labs/ test needing follow-up: None  Follow-up Appointments:     Follow-up Information    Follow up with Pixie Casino, MD On 01/14/2016.   Specialty:  Cardiology   Why:  at 10:15 am   Contact information:   [place] [address] [phone]       Schedule an appointment as soon as possible for a visit with Marylynn Pearson, MD.   Specialties:  Internal Medicine, Geriatric Medicine   Contact information:   [address] [place] [address] [phone]       Discharge Instructions: Discharge Instructions    DME Nebulizer/meds    Complete by:  As directed      Diet - low sodium heart healthy    Complete  by:  As directed      Incentive spirometry RT    Complete by:  As directed      Increase activity slowly    Complete by:  As directed           [name], you were in the hospital for influenza. You had recurrent symptoms, perhaps because of your underlying COPD. Please drink plenty of fluids and make sure family and visitors wear face masks when they enter your room. If you have a fever, chills, night sweats, or worsening shortness of breath, you may be developing a bacterial pneumonia. This can sometimes happen after people get flu. In this case, please seek medical attention. You were evaluated for a pneumonia while you were here and there was no evidence of pneumonia.  As mentioned, you may have a mild COPD exacerbation as well. However, given that you are nonsmoker and have never had pulmonary testing, this should be definitively diagnosed. I encourage you to obtain pulmonary function testings (PFTs).   You will begin to taper off of steroids. Tomorrow, you will take two 10 mg pills of prednisone in the morning with breakfast for 3 days. Then you will take one 10 mg prednisone pill for 3 days, then stop. I have prescribed home oxygen, a nebulizer, and an incentive spirometer. I have also prescribed Mucinex for your cough.   Procedures Performed:  Dg Chest 2 View  01/03/2016  CLINICAL DATA:  Three days of cough and shortness of breath ; history of cardiomyopathy, COPD. EXAM: CHEST  2 VIEW COMPARISON:  Portable chest x-ray of January 02, 2016 FINDINGS: The lungs are mildly hyperinflated. There is no focal infiltrate. Subtle interstitial density is present in the right infrahilar region. There is no alveolar infiltrate. There is no pleural effusion or pneumothorax. There is minimal apical pleural thickening bilaterally. The cardiac silhouette is mildly enlarged but stable. The pulmonary vascularity is normal. The mediastinum is normal in width. The observed bony thorax exhibits no acute  abnormality. There are degenerative changes of both shoulders. IMPRESSION: Hyperinflation consistent with reactive airway disease. Patchy subsegmental atelectasis or infiltrate in the right infrahilar region. There is no alveolar pneumonia. Interval decrease in pulmonary interstitial edema. Electronically Signed   By: David  Martinique M.D.   On: 01/03/2016 07:37   Dg Ribs Unilateral Right  12/29/2015  CLINICAL DATA:  Fall today. Right rib pain and  bruising. Initial encounter. EXAM: RIGHT RIBS - 2 VIEW COMPARISON:  None. FINDINGS: No acute right-sided rib fractures identified. No other bone lesions identified. Prominent anterior costochondral calcification noted. No pneumothorax or hemothorax visualized. IMPRESSION: No acute right rib fractures identified. Electronically Signed   By: Earle Gell M.D.   On: 12/29/2015 14:02   Dg Chest Portable 1 View  01/02/2016  CLINICAL DATA:  Productive cough and shortness of breath. EXAM: PORTABLE CHEST 1 VIEW COMPARISON:  12/29/2015 FINDINGS: Persisting cardiomegaly. Atherosclerosis of the aorta again noted. Venous hypertension without frank edema. No infiltrate, collapse or effusion. No acute bone finding. IMPRESSION: Cardiomegaly and pulmonary venous hypertension. No frank edema. No infiltrate or collapse. Electronically Signed   By: Nelson Chimes M.D.   On: 01/02/2016 13:09   Dg Chest Portable 1 View  12/29/2015  CLINICAL DATA:  Cough, congestion EXAM: PORTABLE CHEST 1 VIEW COMPARISON:  08/23/2012 FINDINGS: There is cardiomegaly with vascular congestion. Mild interstitial prominence could reflect interstitial edema. There is hyperinflation of the lungs compatible with COPD. No effusions or acute bony abnormality. IMPRESSION: COPD. Question mild interstitial edema/ CHF. Electronically Signed   By: Rolm Baptise M.D.   On: 12/29/2015 11:58   Dg Finger Ring Left  12/10/2015  CLINICAL DATA:  Pain, redness and swelling of the left ring finger for 3 days. No known injury.  Initial encounter. EXAM: LEFT RING FINGER 2+V COMPARISON:  None. FINDINGS: No acute bony or joint abnormality is identified. Soft tissues about the left ring finger are mildly swollen. Degenerative change at the DIP joint is noted. There is also degenerative disease at the DIP joint of the left long finger. IMPRESSION: Mild appearing soft tissue swelling without acute bony or joint abnormality. Degenerative disease DIP joint left ring and long fingers. Electronically Signed   By: Inge Rise M.D.   On: 12/10/2015 15:51    Admission HPI: Angela Franco is a 82 year old woman with a past medical history of atrial fibrillation (CHADS-VASC 5 on Eliquis), non-ischemic cardiomyopathy (CHF EF 40-45/% 03/2015), CKD Stage 3, HTN, chronic LBBB, trigeminal neuralgia, and questionable COPD (no PFTs on file) who presents with shortness of breath and wheezing. She was recently discharged on 1/27 to complete a course of steroids and tamiflu for Influenza. Last night. Yesterday, she had a persistent cough keeping her awake at night and was unable to clear any phelgm. Her oxygen was not effective for her dyspnea symptoms. She denied any fever, chills, night sweats, chest pain, leg swelling, or orthopnea. She did report that more residents are her ALF, [facility], had pneumonia. In the ED, she was satting 94% on [facility], which was her oxygen requirement on discharge. A portable CXR was reassuring. Patient is a non-smoker, non-drinker.  Hospital Course by problem list:   Influenza: Her flu panel was positive for inflenza A during the last admission, and she was treated with supportive care for this admission. She completed her 5 day course of oseltamivir on 1/30. She was satting >95% on 2-4L [place]. On day of discharge, she was satting in the low 90s on room air and high 80's while ambulated.  She continued to sound rhonchorous and wheezy on exam, but had improved significantly on nebulized bronchodilators. A CXR demonstrated no  superimposed pneumonia, she had no fevers or leukocytosis. It was thought that her persistent dyspnea could be related to a mild COPD exacerbation.  Mild COPD Exacerbation: As a lifetime nonsmoker and no PFTs on file, it was difficult to ascertain  if she truly had COPD. Nonetheless, she was noted to have hyperinflation on CXR. She was maintained on prednisone 40 mg daily on a six day steroid taper. Taking 20 mg for 3 days and 10 mg for 3 days thereafter. She was also discharged with a home nebulizer with duonebs and incentive spirometry.  Hyponatremia: Sodiums normal at the beginning admission and 129 on day of discharge. She remained asymptomatic. She has had chronic hyponatremia in the past, likely due to Trileptal.  Atrial Fibrillation (CHADS-VASc 5): Patient remained rate controlled but in a fib on Carvedilol 6.25 mg BID. She remained on Eliquis 2.5 mg BID.   CHF (EF 35-40%): There was a low suspicion for CHF exacerbation given she was at her dry weighton admission, lacked crackles on exam.. She was maintained on Furosemide 80 mg daily with home K-dur.  CKD Stage 3: Her creatinine was near baseline of ~1.4 on discharge.  Trigeminal Neuralgia: Maintained on Trileptal 75 mg TID, Gabapenting 600 mg TID, Oxycodone 2.5 mg BID, and Temazepam 15 mg QHS  Iron Deficiency Anemia: Hgb stable in the 9's during admission. Maintained on Ferrous sulfate 325 mg daily.  Discharge Vitals:   BP 128/63 mmHg  Pulse 89  Temp(Src) 97.5 F (36.4 C) (Oral)  Resp 18  Ht 5\' 4"  (1.626 m)  Wt 125 lb 7.1 oz (56.9 kg)  BMI 21.52 kg/m2  SpO2 99%  Discharge Labs:  Results for orders placed or performed during the hospital encounter of 01/02/16 (from the past 24 hour(s))  CBC     Status: Abnormal   Collection Time: 01/04/16  6:56 AM  Result Value Ref Range   WBC 6.0 4.0 - 10.5 K/uL   RBC 3.12 (L) 3.87 - 5.11 MIL/uL   Hemoglobin 9.6 (L) 12.0 - 15.0 g/dL   HCT 29.6 (L) 36.0 - 46.0 %   MCV 94.9 78.0 - 100.0 fL    MCH 30.8 26.0 - 34.0 pg   MCHC 32.4 30.0 - 36.0 g/dL   RDW 14.4 11.5 - 15.5 %   Platelets 202 150 - 400 K/uL  Basic metabolic panel     Status: Abnormal   Collection Time: 01/04/16  6:56 AM  Result Value Ref Range   Sodium 129 (L) 135 - 145 mmol/L   Potassium 3.6 3.5 - 5.1 mmol/L   Chloride 86 (L) 101 - 111 mmol/L   CO2 33 (H) 22 - 32 mmol/L   Glucose, Bld 86 65 - 99 mg/dL   BUN 45 (H) 6 - 20 mg/dL   Creatinine, Ser 1.47 (H) 0.44 - 1.00 mg/dL   Calcium 8.8 (L) 8.9 - 10.3 mg/dL   GFR calc non Af Amer 32 (L) >60 mL/min   GFR calc Af Amer 37 (L) >60 mL/min   Anion gap 10 5 - 15    Signed: Liberty Handy, MD 01/04/2016, 11:12 AM   Equipment Ordered on Discharge: Nebulizer, incentive spirometer, home oxygen

## 2016-01-04 NOTE — Progress Notes (Signed)
Angela Franco to be D/C'd Home per MD order.  Discussed prescriptions and follow up appointments with the patient. Prescriptions given to patient, medication list explained in detail. Pt verbalized understanding.    Medication List    STOP taking these medications        albuterol 108 (90 Base) MCG/ACT inhaler  Commonly known as:  PROVENTIL HFA;VENTOLIN HFA     oseltamivir 30 MG capsule  Commonly known as:  TAMIFLU      TAKE these medications        apixaban 2.5 MG Tabs tablet  Commonly known as:  ELIQUIS  Take 1 tablet (2.5 mg total) by mouth 2 (two) times daily.     calcium-vitamin D 500-200 MG-UNIT tablet  Commonly known as:  OSCAL WITH D  Take 1 tablet by mouth daily with breakfast.     carvedilol 6.25 MG tablet  Commonly known as:  COREG  Take 1 tablet (6.25 mg total) by mouth 2 (two) times daily with a meal.     DSS 100 MG Caps  Take 200 mg by mouth at bedtime as needed.     ferrous sulfate 325 (65 FE) MG tablet  Take 325 mg by mouth daily with breakfast.     furosemide 80 MG tablet  Commonly known as:  LASIX  Take 1 tablet (80 mg total) by mouth daily.     gabapentin 600 MG tablet  Commonly known as:  NEURONTIN  Take 1 tablet (600 mg total) by mouth 3 (three) times daily. Please dose at 8am, 2pm and 8pm     guaiFENesin 600 MG 12 hr tablet  Commonly known as:  MUCINEX  Take 1 tablet (600 mg total) by mouth 2 (two) times daily.     ipratropium-albuterol 0.5-2.5 (3) MG/3ML Soln  Commonly known as:  DUONEB  Take 3 mLs by nebulization every 6 (six) hours as needed.     OXcarbazepine 150 MG tablet  Commonly known as:  TRILEPTAL  Half tablets twice a day,  may increase to one tablets twice a day in one week     oxyCODONE 5 MG immediate release tablet  Commonly known as:  Oxy IR/ROXICODONE  Take 0.5 tablets (2.5 mg total) by mouth 2 (two) times daily as needed. Take 1/2 tablet (2.5 mg) in the am, at lunch and Take 1 tablet (5 mg) at bedtime.     potassium  chloride SA 20 MEQ tablet  Commonly known as:  K-DUR,KLOR-CON  Take 20 mEq by mouth daily.     predniSONE 10 MG tablet  Commonly known as:  DELTASONE  Take 1-2 tablets (10-20 mg total) by mouth daily with breakfast. Take two pills every day for 3 days, then take one pill every day for 3 days, then stop.     simvastatin 10 MG tablet  Commonly known as:  ZOCOR  Take 0.5 tablets (5 mg total) by mouth daily at 6 PM.     temazepam 15 MG capsule  Commonly known as:  RESTORIL  Take 15 mg by mouth at bedtime.        Filed Vitals:   01/04/16 0451 01/04/16 0840  BP: 144/89 128/63  Pulse: 98 89  Temp: 98.7 F (37.1 C) 97.5 F (36.4 C)  Resp: 20 18    Skin clean, dry and intact without evidence of skin break down, no evidence of skin tears noted. IV catheter discontinued intact. Site without signs and symptoms of complications. Dressing and pressure applied. Pt denies pain  at this time. No complaints noted.  An After Visit Summary was printed and given to the patient. Patient escorted via [place], and D/C home via private auto.  Carole Civil RN [facility] [address] Phone [phone]

## 2016-01-04 NOTE — Progress Notes (Signed)
Subjective:  The patient reported that her dyspnea symptoms have improved. She complains of a persistent cough but feels her phlegm is more loose. She reports being ready for discharge today.  Objective: Vital signs in last 24 hours: Filed Vitals:   01/03/16 2000 01/04/16 0451 01/04/16 0840 01/04/16 0851  BP: 130/72 144/89 128/63   Pulse: 76 98 89   Temp: 98.5 F (36.9 C) 98.7 F (37.1 C) 97.5 F (36.4 C)   TempSrc: Oral Oral Oral   Resp: 19 20 18    Height:      Weight: 125 lb 7.1 oz (56.9 kg)     SpO2: 92% 95% 91% 99%   Weight change: -8 lb 8.9 oz (-3.882 kg)  Intake/Output Summary (Last 24 hours) at 01/04/16 1109 Last data filed at 01/04/16 0913  Gross per 24 hour  Intake    462 ml  Output   1080 ml  Net   -618 ml   Physical Exam General: Lying in bed, NAD HEENT: NCAT,  Sclerae ancicteric, Cardiovascular: irregularly irregular rhythm, regular rate. No murmurs, rubs, or gallops Pulmonary: Rhonchorous, wheezy breath sounds but improved from yesterday.  Abdominal: Soft. NTND. Normal BS Extremities: No clubbing, edema, or cyanosis Psychiatric: Normal behavior and affect.  Lab Results: Basic Metabolic Panel:  Recent Labs Lab 01/02/16 1302 01/04/16 0656  NA 135 129*  K 4.4 3.6  CL 90* 86*  CO2 31 33*  GLUCOSE 109* 86  BUN 45* 45*  CREATININE 1.36* 1.47*  CALCIUM 9.2 8.8*   CBC:  Recent Labs Lab 12/29/15 1100  01/02/16 1302 01/04/16 0656  WBC 4.1  < > 6.8 6.0  NEUTROABS 1.8  --   --   --   HGB 9.9*  < > 9.9* 9.6*  HCT 29.9*  < > 31.8* 29.6*  MCV 94.9  < > 95.8 94.9  PLT 138*  < > 177 202  < > = values in this interval not displayed.   Studies/Results: Dg Chest 2 View  01/03/2016  CLINICAL DATA:  Three days of cough and shortness of breath ; history of cardiomyopathy, COPD. EXAM: CHEST  2 VIEW COMPARISON:  Portable chest x-ray of January 02, 2016 FINDINGS: The lungs are mildly hyperinflated. There is no focal infiltrate. Subtle interstitial  density is present in the right infrahilar region. There is no alveolar infiltrate. There is no pleural effusion or pneumothorax. There is minimal apical pleural thickening bilaterally. The cardiac silhouette is mildly enlarged but stable. The pulmonary vascularity is normal. The mediastinum is normal in width. The observed bony thorax exhibits no acute abnormality. There are degenerative changes of both shoulders. IMPRESSION: Hyperinflation consistent with reactive airway disease. Patchy subsegmental atelectasis or infiltrate in the right infrahilar region. There is no alveolar pneumonia. Interval decrease in pulmonary interstitial edema. Electronically Signed   By: David  Martinique M.D.   On: 01/03/2016 07:37   Dg Chest Portable 1 View  01/02/2016  CLINICAL DATA:  Productive cough and shortness of breath. EXAM: PORTABLE CHEST 1 VIEW COMPARISON:  12/29/2015 FINDINGS: Persisting cardiomegaly. Atherosclerosis of the aorta again noted. Venous hypertension without frank edema. No infiltrate, collapse or effusion. No acute bone finding. IMPRESSION: Cardiomegaly and pulmonary venous hypertension. No frank edema. No infiltrate or collapse. Electronically Signed   By: Nelson Chimes M.D.   On: 01/02/2016 13:09   Medications: I have reviewed the patient's current medications. Scheduled Meds: . antiseptic oral rinse  7 mL Mouth Rinse BID  . apixaban  2.5 mg Oral BID  .  calcium-vitamin D  1 tablet Oral Q breakfast  . carvedilol  6.25 mg Oral BID WC  . dextromethorphan-guaiFENesin  1 tablet Oral BID  . ferrous sulfate  325 mg Oral Q breakfast  . furosemide  80 mg Oral Daily  . gabapentin  600 mg Oral 3 times per day  . ipratropium-albuterol  3 mL Nebulization TID  . oseltamivir  30 mg Oral Daily  . OXcarbazepine  75 mg Oral TID  . predniSONE  40 mg Oral Q breakfast  . simvastatin  5 mg Oral q1800  . temazepam  15 mg Oral QHS   Continuous Infusions:  PRN Meds:.albuterol, docusate  sodium Assessment/Plan:  Influenza: She is flu positive from the last admission. No significant leukocytosis or CXR findings consistent with a superimposed pneumonia. Will be managed with supportive care. She has a questionable diagnosis of COPD, so it is possible there is a concomitant COPD exacerbation. - Albuterol nebulizer every 6 hours pran - Duonebs q6h - Prednisone 40 mg daily, will start steroid taper tomorrow - Mucinex  Questionable COPD: Nonsmoker, no PFTs on file.  - Respiratory therapy as above.   Atrial Fibrillation (CHADS-VASc 5): Currently in Afib on exam and telemetry. - Eliquis 2.5 mg BID - Carvedilol 6.25 mg BID  CHF (EF 35-40%): Low suspicion for CHF exacerbation at this time. At dry weight. - Furosemide 80 mg daily - Daily weights - Strict I's O's - K-dur 20 mEq daily  CKD Stage 3: Creatinine near baseline of 1.4  Trigeminal Neuralgia: - Trileptal 75 mg TID, causes chronic hyponatremia, at 129 today and asymptomatic - Gabapenting 600 mg TID - Oxycodone 2.5 mg BID - Temazepam 15 mg QHS  Iron Deficiency Anemia: Hgb 9.9 - Ferrous sulfate 325 mg daily  DVT PPx: Eliquis as above  Dispo: Anticipated discharge to ALF[facility], today   The patient does have a current PCP Marylynn Pearson, MD) and does not need an [facility] hospital follow-up appointment after discharge.  The patient does have transportation limitations that hinder transportation to clinic appointments.  .Services Needed at time of discharge: Y = Yes, Blank = No PT:   OT:   RN:   Equipment: Nebulizer, Chiropodist  Other: Home Oxygen      Liberty Handy, MD 01/04/2016, 11:09 AM

## 2016-01-04 NOTE — Progress Notes (Signed)
SATURATION QUALIFICATIONS:   Patient Saturations on Room Air at Rest = 92% Patient Saturations on Room Air while Ambulating = 88% Patient Saturations on 2 Liters of oxygen while Ambulating = 97%  Please briefly explain why patient needs home oxygen:  Pt's saturations decrease on room air during ambulation.

## 2016-01-04 NOTE — Progress Notes (Signed)
Patient seen and examined. Case d/w residents in detail. I agree with findings and plan as documented in Dr. Barbera Setters note.  Patient is much improved today. Will c/w prednisone taper, nebs for possible COPD exacerbation. She has completed her course of tamiflu. Stable for d/c today. Outpatient f/u with PCP.

## 2016-01-06 NOTE — Care Management Obs Status (Addendum)
MEDICARE OBSERVATION STATUS NOTIFICATION Late entry, Obs notification given 01/05/16.   Patient Details  Name: Angela Franco MRN: [id number] Date of Birth: 14-Jan-1929   Medicare Observation Status Notification Given:  Yes    Joban Colledge, Rory Percy, RN 01/06/2016, 9:57 AM

## 2016-01-14 ENCOUNTER — Ambulatory Visit (INDEPENDENT_AMBULATORY_CARE_PROVIDER_SITE_OTHER): Payer: Medicare Other | Admitting: Internal Medicine

## 2016-01-14 ENCOUNTER — Encounter: Payer: Self-pay | Admitting: Internal Medicine

## 2016-01-14 VITALS — BP 128/72 | HR 80 | Ht 63.0 in | Wt 133.6 lb

## 2016-01-14 DIAGNOSIS — I272 Other secondary pulmonary hypertension: Secondary | ICD-10-CM | POA: Diagnosis not present

## 2016-01-14 DIAGNOSIS — I428 Other cardiomyopathies: Secondary | ICD-10-CM

## 2016-01-14 DIAGNOSIS — I447 Left bundle-branch block, unspecified: Secondary | ICD-10-CM

## 2016-01-14 DIAGNOSIS — I429 Cardiomyopathy, unspecified: Secondary | ICD-10-CM

## 2016-01-14 NOTE — Progress Notes (Signed)
OFFICE NOTE  Chief Complaint:  Follow-up hospitalization  Primary Care Physician: Marylynn Pearson, MD  HPI:  Angela Franco is an 82 year old female who was recently hospitalized for hyponatremia and atrial fibrillation. She had a cardiac catheterization last summer that showed an EF of 30% to 35%; however, this improved to 40% to 45% with a repeat echocardiogram. She also has a chronic left bundle branch block. She has had problems with encephalopathy and was on Tegretol in the past. These symptoms have improved; however, still has persistent atrial fibrillation. In addition, during her hospitalization, it was complicated by anemia and was found to be iron deficient. She also had a GI workup, which did not reveal any bleeding source. She was taken off of warfarin, which she was taking at the time, but was never restarted. Overall, she seems much better and in fact is not complaining of nearly as many problems since her hospitalization. It is also important to remember that she had a trigeminal neuralgia as well that stopped.   At her last office visit I recommended starting Eliquis which she has been taking without any bleeding problems. She is maintaining atrial fibrillation today which is probably permanent A. fib at this point. She denies any worsening shortness of breath or chest pain.  I saw [name] back in the office today. She seems to be doing fairly well. She denies any worsening shortness of breath, weight gain or swelling. She is tolerating Eliquis without any bleeding, occasions. She takes Lasix 3 times a week. She was recently told by her primary care provider that she does have some mild chronic kidney disease. This needs to be reassessed. She was also supposed to have an echocardiogram prior to this visit as her last EF was 40-45% in 2013.  Angela Franco returns today for follow-up. She is experiencing some increased shortness of breath and weight gain. Her weights up about 7  pounds since her last office visit. She's noted some worsening leg swelling and even some swelling in her face. She's had an ongoing lower extremity wound problem which she is getting care at the wound care center. She is also dealing with trigeminal neuralgia which is quite painful. She reports some worsening shortness of breath and orthopnea. She is needed to use oxygen at night the last few evenings.  I saw Angela Franco today back in the office. Unfortunately she was hospitalized in January for influenza. She did have the vaccine although was sick for several weeks. She was actually hospitalized twice. She was treated with Tamiflu and then continued to worsen. Fortunately she is recovered. She actually lost about 25 pounds. Some of that may have been with increased diuresis as I increased her Lasix back in November, however her weight now is back up to 133 pounds from 125 pounds.  she says she feels much better finally. Blood pressure is well-controlled. She denies any worsening shortness of breath or lower extremity edema. She was maintained on Eliquis and is not having any bleeding problems.   PMHx:  Past Medical History  Diagnosis Date  . COPD (chronic obstructive pulmonary disease) ([place])     Never smoked. Questionable diagnosis.  . Atrial fibrillation ([place])   . Osteopenia   . Insomnia   . Hyponatremia   . Hypertension   . UTI (lower urinary tract infection)   . Bursitis of hip   . Trigeminal neuralgia   . Altered mental status 08/24/2012  . Anemia, with significant drop in H/H 08/24/2012  .  CHF (congestive heart failure) ([place])   . Heart disease   . Acute facial pain   . LBBB (left bundle branch block)   . Nonischemic cardiomyopathy ([place])   . Dyslipidemia   . Kidney failure     Stage 3    Past Surgical History  Procedure Laterality Date  . Appendectomy    . Tonsillectomy and adenoidectomy    . Cardiac catheterization  07/04/2012    normal coronaries, EF 30-35%, elevated R & L heart  pressures, reduced CO, mild pulm HTN (Dr. K. Mali Hilty)  . Transthoracic echocardiogram  2013    mild conc LVH, RV mildly dilated; LA severely dilated; RA mod dilated; mild-mod mitral annular calcif, calcif of anterior & posterior MV leaflet, mod MR; mod-severe TR with RSVP 30-63mmHg; mild calcif of AV leaflets and mod aortic regurg; mild pulm valve regurg; pericardial effusion (right)  . Nm myocar perf wall motion  2011    lexiscan myoview - normal LV systolic function w/normal wall motion, no evidence of scar/ischemia  . Left and right heart catheterization with coronary angiogram N/A 07/04/2012    Procedure: LEFT AND RIGHT HEART CATHETERIZATION WITH CORONARY ANGIOGRAM;  Surgeon: Pixie Casino, MD;  Location: [facility] C[facility] LAB;  Service: Cardiovascular;  Laterality: N/A;    FAMHx:  Family History  Problem Relation Age of Onset  . Stroke Father   . Heart attack Father   . Heart attack Brother   . Heart attack Brother   . Alzheimer's disease Mother     SOCHx:   reports that she has never smoked. She has never used smokeless tobacco. She reports that she does not drink alcohol or use illicit drugs.  ALLERGIES:  Allergies  Allergen Reactions  . Penicillins Hives    Has patient had a PCN reaction causing immediate rash, facial/tongue/throat swelling, SOB or lightheadedness with hypotension: No Has patient had a PCN reaction causing severe rash involving mucus membranes or skin necrosis: No Has patient had a PCN reaction that required hospitalization No Has patient had a PCN reaction occurring within the last 10 years: No If all of the above answers are "NO", then may proceed with Cephalosporin use.    ROS: Pertinent items noted in HPI and remainder of comprehensive ROS otherwise negative.  HOME MEDS: Current Outpatient Prescriptions  Medication Sig Dispense Refill  . apixaban (ELIQUIS) 2.5 MG TABS tablet Take 1 tablet (2.5 mg total) by mouth 2 (two) times daily. 60 tablet 11  .  calcium-vitamin D (OSCAL WITH D) 500-200 MG-UNIT tablet Take 1 tablet by mouth daily with breakfast.    . carvedilol (COREG) 6.25 MG tablet Take 1 tablet (6.25 mg total) by mouth 2 (two) times daily with a meal. 60 tablet 3  . docusate sodium 100 MG CAPS Take 200 mg by mouth at bedtime as needed. (Patient taking differently: Take 100 mg by mouth at bedtime as needed (constipation). ) 60 capsule 0  . ferrous sulfate 325 (65 FE) MG tablet Take 325 mg by mouth daily with breakfast.    . furosemide (LASIX) 80 MG tablet Take 1 tablet (80 mg total) by mouth daily. 30 tablet 11  . gabapentin (NEURONTIN) 600 MG tablet Take 1 tablet (600 mg total) by mouth 3 (three) times daily. Please dose at 8am, 2pm and 8pm 270 tablet 3  . ipratropium-albuterol (DUONEB) 0.5-2.5 (3) MG/3ML SOLN Take 3 mLs by nebulization every 6 (six) hours as needed. 360 mL 12  . OXcarbazepine (TRILEPTAL) 150 MG tablet Half  tablets twice a day,  may increase to one tablets twice a day in one week (Patient taking differently: Take 75 mg by mouth 3 (three) times daily. Take 1/2 tablet (75 mg) at 0600, 1200, 1800.) 60 tablet 3  . potassium chloride SA (K-DUR,KLOR-CON) 20 MEQ tablet Take 20 mEq by mouth daily.    . simvastatin (ZOCOR) 10 MG tablet Take 0.5 tablets (5 mg total) by mouth daily at 6 PM. 45 tablet 3  . temazepam (RESTORIL) 15 MG capsule Take 15 mg by mouth at bedtime.      No current facility-administered medications for this visit.    LABS/IMAGING: No results found for this or any previous visit (from the past 48 hour(s)). No results found.  VITALS: BP 128/72 mmHg  Pulse 80  Ht 5\' 3"  (1.6 m)  Wt 133 lb 9.6 oz (60.601 kg)  BMI 23.67 kg/m2  EXAM: General appearance: alert and no distress Neck: JVD - 9 cm above sternal notch and no carotid bruit Lungs: diminished breath sounds bilaterally and rales bibasilar Heart: irregularly irregular rhythm Abdomen: soft, non-tender; bowel sounds normal; no masses,  no  organomegaly Extremities: edema 1+ pitting bilaterally and venous stasis dermatitis noted Pulses: 2+ and symmetric Skin: Skin color, texture, turgor normal. No rashes or lesions Neurologic: Grossly normal Psych: Mood, affect normal  EKG: Deferred   ASSESSMENT: 1. Permanent atrial fibrillation - on Eliquis 2. Left bundle Branch block 3. Ischemic cardiomyopathy EF 40-45% 4. Dyslipidemia 5. Acute on chronic systolic/diastolic congestive heart failure  6. CKD 4  PLAN: 1.   Mrs. Rosenkrantz  is doing better unfortunately after recent hospitalizations for influenza. Despite the vaccine she was fairly sick and has fortunately recovered. Her heart failure seems to be stable. Weight is starting to come up however she lost a lot of weight due to not being able to eat. I think a good dry weight for her somewhere around 130-135 pounds. I would recommended continuing her current dose of Lasix.   Plan follow-up in 6 months.   Pixie Casino, MD, [facility] Attending Cardiologist [place] C Hilty 01/14/2016, 5:47 PM

## 2016-01-14 NOTE — Patient Instructions (Signed)
Your physician wants you to follow-up in: 6 months with Dr. Hilty. You will receive a reminder letter in the mail two months in advance. If you don't receive a letter, please call our office to schedule the follow-up appointment.    

## 2016-01-19 ENCOUNTER — Encounter: Payer: Self-pay | Admitting: Neurology

## 2016-01-19 ENCOUNTER — Ambulatory Visit (INDEPENDENT_AMBULATORY_CARE_PROVIDER_SITE_OTHER): Payer: Medicare Other | Admitting: Neurology

## 2016-01-19 VITALS — BP 138/86 | HR 99 | Ht 63.0 in | Wt 133.0 lb

## 2016-01-19 DIAGNOSIS — G500 Trigeminal neuralgia: Secondary | ICD-10-CM

## 2016-01-19 MED ORDER — GABAPENTIN 600 MG PO TABS: 600.0000 mg | ORAL_TABLET | Freq: Three times a day (TID) | ORAL | Status: DC

## 2016-01-19 NOTE — Progress Notes (Signed)
Chief Complaint  Patient presents with  . Trigeminal Neuralgia    She is here with her daughter, Angela Franco. Her pain has improved. She is still taking gabapentin 600mg , TID.  She has been weaning off Trileptal and her last dose was today.  She saw Dr. Salomon Fick and was not interesed in having gamma knife surgery.     Chief Complaint  Patient presents with  . Trigeminal Neuralgia    She is here with her daughter, Angela Franco. Her pain has improved. She is still taking gabapentin 600mg , TID.  She has been weaning off Trileptal and her last dose was today.  She saw Dr. Salomon Fick and was not interesed in having gamma knife surgery.      [facility]  PATIENT: Angela Franco DOB: November 15, 1929  HISTORY OF PRESENT ILLNESS:Angela Franco, 82 year old female returns for followup. She has a history of facial pain and mild memory loss. Her memory score was 30/30 at last visit.  She had previously been on carbamazepine but due to  sodium levels in the low 120s her carbamazepine has been discontinued and she is on gabapentin for her facial pain with good control. She returns today with her daughter. She is currently in an assisted living. She exercises on a regular basis. She has no new complaints. She returns for reevaluation. She has attempted to taper the gabapentin with return of the facial pain. She was treated for pneumonia 2 months ago  HISTORY: She has history of facial pain and history of memory loss. She is doing well with her facial pain and her memory loss is stable. Her primary care physician Angela Franco called to the office 08/01/2013 and spoke to Angela Franco about decreasing carbamazepine due to sodium levels in the low 120s. She continues to take one carbamazepine daily She is also on gabapentin. She is accompanied by her son today. She is currently at an assisted living. She has no new complaints    UPDATE June 14 2015: He is with her daughter at today's clinical visit, she is seeing  nephrologist for worsening chronic renal insufficiency, she is taking Gabapentin  600mg  tid, there was no recurrent right facial pain,  She lives alone at independent living, still active, exercise regularly, no significant memory trouble,  She does not want changes of her gabapentin, despite worsening kidney function, because recurrent right facial pain the past with lower dose of gabapentin,   UPDATE Oct 13 2015: She is with her daughter, she now complains of worsening right facial/cheek pain since end of Oct 2016, she is taking gabapentin 600mg  tid, oxycodone as needed, which does help her pain, the benefit of oxycodone lasted about 6 hours.  It does make her dizziness, confusion, previously carbamazepine was helpful, but caused hyponatremia,    UPDATE Jan 19 2016: Her right facial pain last till Jan 2017, She was seen by Dr. Salomon Fick, she wants to hold off any intervention at this point  At her worst, she was taking gabapentin 600 mg 3 times a day, Trileptal 150 mg half tablet 3 times a day, oxycodone 5 mg half to 1 tablet daily as needed, usually less than 2 tablets a day, she was giving fentanyl patch 25 g, but never has to use it.     REVIEW OF SYSTEMS: Full 14 system review of systems performed and notable only for those listed, all others are neg:  As above   ALLERGIES: Allergies  Allergen Reactions  . Penicillins Hives    Has patient had  a PCN reaction causing immediate rash, facial/tongue/throat swelling, SOB or lightheadedness with hypotension: No Has patient had a PCN reaction causing severe rash involving mucus membranes or skin necrosis: No Has patient had a PCN reaction that required hospitalization No Has patient had a PCN reaction occurring within the last 10 years: No If all of the above answers are "NO", then may proceed with Cephalosporin use.    HOME MEDICATIONS: Outpatient Prescriptions Prior to Visit  Medication Sig Dispense Refill  . apixaban (ELIQUIS) 2.5 MG  TABS tablet Take 1 tablet (2.5 mg total) by mouth 2 (two) times daily. 60 tablet 11  . calcium-vitamin D (OSCAL WITH D) 500-200 MG-UNIT tablet Take 1 tablet by mouth daily with breakfast.    . carvedilol (COREG) 6.25 MG tablet Take 1 tablet (6.25 mg total) by mouth 2 (two) times daily with a meal. 60 tablet 3  . docusate sodium 100 MG CAPS Take 200 mg by mouth at bedtime as needed. (Patient taking differently: Take 100 mg by mouth at bedtime as needed (constipation). ) 60 capsule 0  . ferrous sulfate 325 (65 FE) MG tablet Take 325 mg by mouth daily with breakfast.    . furosemide (LASIX) 80 MG tablet Take 1 tablet (80 mg total) by mouth daily. 30 tablet 11  . gabapentin (NEURONTIN) 600 MG tablet Take 1 tablet (600 mg total) by mouth 3 (three) times daily. Please dose at 8am, 2pm and 8pm 270 tablet 3  . ipratropium-albuterol (DUONEB) 0.5-2.5 (3) MG/3ML SOLN Take 3 mLs by nebulization every 6 (six) hours as needed. 360 mL 12  . potassium chloride SA (K-DUR,KLOR-CON) 20 MEQ tablet Take 20 mEq by mouth daily.    . simvastatin (ZOCOR) 10 MG tablet Take 0.5 tablets (5 mg total) by mouth daily at 6 PM. 45 tablet 3  . temazepam (RESTORIL) 15 MG capsule Take 15 mg by mouth at bedtime.     . OXcarbazepine (TRILEPTAL) 150 MG tablet Half tablets twice a day,  may increase to one tablets twice a day in one week (Patient taking differently: Take 75 mg by mouth 3 (three) times daily. Take 1/2 tablet (75 mg) at 0600, 1200, 1800.) 60 tablet 3   No facility-administered medications prior to visit.    PAST MEDICAL HISTORY: Past Medical History  Diagnosis Date  . COPD (chronic obstructive pulmonary disease) ([place])     Never smoked. Questionable diagnosis.  . Atrial fibrillation ([place])   . Osteopenia   . Insomnia   . Hyponatremia   . Hypertension   . UTI (lower urinary tract infection)   . Bursitis of hip   . Trigeminal neuralgia   . Altered mental status 08/24/2012  . Anemia, with significant drop in H/H  08/24/2012  . CHF (congestive heart failure) ([place])   . Heart disease   . Acute facial pain   . LBBB (left bundle branch block)   . Nonischemic cardiomyopathy ([place])   . Dyslipidemia   . Kidney failure     Stage 3    PAST SURGICAL HISTORY: Past Surgical History  Procedure Laterality Date  . Appendectomy    . Tonsillectomy and adenoidectomy    . Cardiac catheterization  07/04/2012    normal coronaries, EF 30-35%, elevated R & L heart pressures, reduced CO, mild pulm HTN (Dr. K. Mali Hilty)  . Transthoracic echocardiogram  2013    mild conc LVH, RV mildly dilated; LA severely dilated; RA mod dilated; mild-mod mitral annular calcif, calcif of anterior & posterior  MV leaflet, mod MR; mod-severe TR with RSVP 30-8mmHg; mild calcif of AV leaflets and mod aortic regurg; mild pulm valve regurg; pericardial effusion (right)  . Nm myocar perf wall motion  2011    lexiscan myoview - normal LV systolic function w/normal wall motion, no evidence of scar/ischemia  . Left and right heart catheterization with coronary angiogram N/A 07/04/2012    Procedure: LEFT AND RIGHT HEART CATHETERIZATION WITH CORONARY ANGIOGRAM;  Surgeon: Pixie Casino, MD;  Location: [facility] CATH LAB;  Service: Cardiovascular;  Laterality: N/A;    FAMILY HISTORY: Family History  Problem Relation Age of Onset  . Stroke Father   . Heart attack Father   . Heart attack Brother   . Heart attack Brother   . Alzheimer's disease Mother     SOCIAL HISTORY: Social History   Social History  . Marital Status: Widowed    Spouse Name: N/A  . Number of Children: 3  . Years of Education: 12   Occupational History  . Not on file.   Social History Main Topics  . Smoking status: Never Smoker   . Smokeless tobacco: Never Used  . Alcohol Use: No  . Drug Use: No  . Sexual Activity: Not on file   Other Topics Concern  . Not on file   Social History Narrative   Patient is widowed.    Patient has 3 children.    Patient is retired.        PHYSICAL EXAM  Filed Vitals:   01/19/16 1434  BP: 138/86  Pulse: 99  Height: 5\' 3"  (1.6 m)  Weight: 133 lb (60.328 kg)   Body mass index is 23.57 kg/(m^2).   PHYSICAL EXAMNIATION:  Gen: NAD, conversant, well nourised, obese, well groomed                     Cardiovascular: Regular rate rhythm, no peripheral edema, warm, nontender. Eyes: Conjunctivae clear without exudates or hemorrhage Neck: Supple, no carotid bruise. Pulmonary: Clear to auscultation bilaterally   NEUROLOGICAL EXAM:  MENTAL STATUS: Speech:    Speech is normal; fluent and spontaneous with normal comprehension.  Cognition:     Orientation to time, place and person     Normal recent and remote memory     Normal Attention span and concentration     Normal Language, naming, repeating,spontaneous speech     Fund of knowledge   CRANIAL NERVES: CN II: Visual fields are full to confrontation. Pupils are round equal and briskly reactive to light. CN III, IV, VI: extraocular movement are normal. No ptosis. CN V: Facial sensation is intact to pinprick in all 3 divisions bilaterally. Corneal responses are intact.  CN VII: Face is symmetric with normal eye closure and smile. CN VIII: Hearing is normal to rubbing fingers CN IX, X: Palate elevates symmetrically. Phonation is normal. CN XI: Head turning and shoulder shrug are intact CN XII: Tongue is midline with normal movements and no atrophy.  MOTOR: There is no pronator drift of out-stretched arms. Muscle bulk and tone are normal. Muscle strength is normal.  REFLEXES: Reflexes are 2+ and symmetric at the biceps, triceps, knees, and ankles. Plantar responses are flexor.  SENSORY: Intact to light touch, pinprick, position sense, and vibration sense are intact in fingers and toes.  COORDINATION: Rapid alternating movements and fine finger movements are intact. There is no dysmetria on finger-to-nose and heel-knee-shin.    GAIT/STANCE: Mildly  unsteady  DIAGNOSTIC DATA (LABS, IMAGING, TESTING) - ASSESSMENT  AND PLAN  82 y.o. year old female   Trigeminal neuralgia   Continue gabapentin 600 mg 3 times daily  At her worst, she responded with gabapentin 600 mg 3 times a day, Trileptal 150 mg half tablets 3 times a day, Oxycodone 5 mg as needed half to 1 tablet daily she never tried a fentanyl patch 25 g, she has a history of hyponatremia with couple benzocaine, and increased confusion with Trileptal low sodium 133  She was evaluated by [facility] radiation oncologist Dr. Vallarie Mare, in January 2017 she wants to hold off any procedure at this point.   Marcial Pacas, M.D. Ph.D.  [facility] [place],  [address] Phone: [phone] Fax:      [phone]

## 2016-01-20 ENCOUNTER — Other Ambulatory Visit: Payer: Self-pay | Admitting: Internal Medicine

## 2016-01-20 ENCOUNTER — Inpatient Hospital Stay (HOSPITAL_COMMUNITY)
Admission: EM | Admit: 2016-01-20 | Discharge: 2016-01-26 | DRG: 291 | Disposition: A | Discharge status: Skilled Nursing Facility | Payer: Medicare Other | Attending: Internal Medicine | Admitting: Internal Medicine

## 2016-01-20 ENCOUNTER — Ambulatory Visit
Admission: RE | Admit: 2016-01-20 | Discharge: 2016-01-20 | Disposition: A | Discharge status: Home / Self Care | Payer: Medicare Other | Source: Ambulatory Visit | Attending: Internal Medicine | Admitting: Internal Medicine

## 2016-01-20 ENCOUNTER — Encounter (HOSPITAL_COMMUNITY): Payer: Self-pay

## 2016-01-20 ENCOUNTER — Emergency Department (HOSPITAL_COMMUNITY): Payer: Medicare Other

## 2016-01-20 DIAGNOSIS — I130 Hypertensive heart and chronic kidney disease with heart failure and stage 1 through stage 4 chronic kidney disease, or unspecified chronic kidney disease: Secondary | ICD-10-CM | POA: Diagnosis present

## 2016-01-20 DIAGNOSIS — E876 Hypokalemia: Secondary | ICD-10-CM | POA: Diagnosis not present

## 2016-01-20 DIAGNOSIS — I447 Left bundle-branch block, unspecified: Secondary | ICD-10-CM | POA: Diagnosis present

## 2016-01-20 DIAGNOSIS — B338 Other specified viral diseases: Secondary | ICD-10-CM

## 2016-01-20 DIAGNOSIS — G4700 Insomnia, unspecified: Secondary | ICD-10-CM | POA: Diagnosis present

## 2016-01-20 DIAGNOSIS — J988 Other specified respiratory disorders: Secondary | ICD-10-CM

## 2016-01-20 DIAGNOSIS — Z66 Do not resuscitate: Secondary | ICD-10-CM | POA: Diagnosis present

## 2016-01-20 DIAGNOSIS — I429 Cardiomyopathy, unspecified: Secondary | ICD-10-CM | POA: Diagnosis present

## 2016-01-20 DIAGNOSIS — G500 Trigeminal neuralgia: Secondary | ICD-10-CM | POA: Diagnosis present

## 2016-01-20 DIAGNOSIS — Z9981 Dependence on supplemental oxygen: Secondary | ICD-10-CM

## 2016-01-20 DIAGNOSIS — I272 Other secondary pulmonary hypertension: Secondary | ICD-10-CM | POA: Diagnosis present

## 2016-01-20 DIAGNOSIS — N183 Chronic kidney disease, stage 3 unspecified: Secondary | ICD-10-CM | POA: Diagnosis present

## 2016-01-20 DIAGNOSIS — B974 Respiratory syncytial virus as the cause of diseases classified elsewhere: Secondary | ICD-10-CM

## 2016-01-20 DIAGNOSIS — I959 Hypotension, unspecified: Secondary | ICD-10-CM | POA: Diagnosis not present

## 2016-01-20 DIAGNOSIS — I5043 Acute on chronic combined systolic (congestive) and diastolic (congestive) heart failure: Secondary | ICD-10-CM | POA: Diagnosis not present

## 2016-01-20 DIAGNOSIS — I482 Chronic atrial fibrillation, unspecified: Secondary | ICD-10-CM | POA: Diagnosis present

## 2016-01-20 DIAGNOSIS — I5023 Acute on chronic systolic (congestive) heart failure: Secondary | ICD-10-CM

## 2016-01-20 DIAGNOSIS — R0600 Dyspnea, unspecified: Secondary | ICD-10-CM

## 2016-01-20 DIAGNOSIS — D649 Anemia, unspecified: Secondary | ICD-10-CM | POA: Diagnosis present

## 2016-01-20 DIAGNOSIS — Z79899 Other long term (current) drug therapy: Secondary | ICD-10-CM

## 2016-01-20 DIAGNOSIS — D509 Iron deficiency anemia, unspecified: Secondary | ICD-10-CM | POA: Diagnosis present

## 2016-01-20 DIAGNOSIS — Z7901 Long term (current) use of anticoagulants: Secondary | ICD-10-CM

## 2016-01-20 DIAGNOSIS — Z7952 Long term (current) use of systemic steroids: Secondary | ICD-10-CM

## 2016-01-20 DIAGNOSIS — I071 Rheumatic tricuspid insufficiency: Secondary | ICD-10-CM | POA: Diagnosis present

## 2016-01-20 DIAGNOSIS — I428 Other cardiomyopathies: Secondary | ICD-10-CM | POA: Diagnosis present

## 2016-01-20 DIAGNOSIS — E785 Hyperlipidemia, unspecified: Secondary | ICD-10-CM | POA: Diagnosis present

## 2016-01-20 DIAGNOSIS — J441 Chronic obstructive pulmonary disease with (acute) exacerbation: Secondary | ICD-10-CM

## 2016-01-20 DIAGNOSIS — M8580 Other specified disorders of bone density and structure, unspecified site: Secondary | ICD-10-CM | POA: Diagnosis present

## 2016-01-20 DIAGNOSIS — J9621 Acute and chronic respiratory failure with hypoxia: Secondary | ICD-10-CM | POA: Diagnosis present

## 2016-01-20 DIAGNOSIS — I5033 Acute on chronic diastolic (congestive) heart failure: Secondary | ICD-10-CM | POA: Insufficient documentation

## 2016-01-20 DIAGNOSIS — I5022 Chronic systolic (congestive) heart failure: Secondary | ICD-10-CM | POA: Insufficient documentation

## 2016-01-20 DIAGNOSIS — N2889 Other specified disorders of kidney and ureter: Secondary | ICD-10-CM | POA: Diagnosis present

## 2016-01-20 DIAGNOSIS — E871 Hypo-osmolality and hyponatremia: Secondary | ICD-10-CM | POA: Diagnosis present

## 2016-01-20 DIAGNOSIS — J121 Respiratory syncytial virus pneumonia: Secondary | ICD-10-CM | POA: Diagnosis present

## 2016-01-20 DIAGNOSIS — I10 Essential (primary) hypertension: Secondary | ICD-10-CM | POA: Diagnosis present

## 2016-01-20 LAB — COMPREHENSIVE METABOLIC PANEL
ALT: 17 U/L (ref 14–54)
ANION GAP: 12 (ref 5–15)
AST: 29 U/L (ref 15–41)
Albumin: 3.2 g/dL — ABNORMAL LOW (ref 3.5–5.0)
Alkaline Phosphatase: 78 U/L (ref 38–126)
BILIRUBIN TOTAL: 0.6 mg/dL (ref 0.3–1.2)
BUN: 31 mg/dL — AB (ref 6–20)
CO2: 29 mmol/L (ref 22–32)
Calcium: 9.2 mg/dL (ref 8.9–10.3)
Chloride: 91 mmol/L — ABNORMAL LOW (ref 101–111)
Creatinine, Ser: 1.55 mg/dL — ABNORMAL HIGH (ref 0.44–1.00)
GFR calc Af Amer: 34 mL/min — ABNORMAL LOW (ref >60)
GFR, EST NON AFRICAN AMERICAN: 30 mL/min — AB (ref >60)
Glucose, Bld: 142 mg/dL — ABNORMAL HIGH (ref 65–99)
POTASSIUM: 3.9 mmol/L (ref 3.5–5.1)
Sodium: 132 mmol/L — ABNORMAL LOW (ref 135–145)
TOTAL PROTEIN: 8.4 g/dL — AB (ref 6.5–8.1)

## 2016-01-20 LAB — CBC WITH DIFFERENTIAL/PLATELET
Basophils Absolute: 0 10*3/uL (ref 0.0–0.1)
Basophils Relative: 0 %
EOS PCT: 0 %
Eosinophils Absolute: 0 10*3/uL (ref 0.0–0.7)
HEMATOCRIT: 32.4 % — AB (ref 36.0–46.0)
Hemoglobin: 10.3 g/dL — ABNORMAL LOW (ref 12.0–15.0)
LYMPHS ABS: 1 10*3/uL (ref 0.7–4.0)
Lymphocytes Relative: 17 %
MCH: 30.6 pg (ref 26.0–34.0)
MCHC: 31.8 g/dL (ref 30.0–36.0)
MCV: 96.1 fL (ref 78.0–100.0)
Monocytes Absolute: 0.2 10*3/uL (ref 0.1–1.0)
Monocytes Relative: 3 %
NEUTROS PCT: 80 %
Neutro Abs: 4.9 10*3/uL (ref 1.7–7.7)
Platelets: 172 10*3/uL (ref 150–400)
RBC: 3.37 MIL/uL — ABNORMAL LOW (ref 3.87–5.11)
RDW: 14.9 % (ref 11.5–15.5)
WBC: 6.1 10*3/uL (ref 4.0–10.5)

## 2016-01-20 LAB — I-STAT TROPONIN, ED: Troponin i, poc: 0.01 ng/mL (ref 0.00–0.08)

## 2016-01-20 LAB — I-STAT CG4 LACTIC ACID, ED: LACTIC ACID, VENOUS: 0.91 mmol/L (ref 0.5–2.0)

## 2016-01-20 LAB — BRAIN NATRIURETIC PEPTIDE: B NATRIURETIC PEPTIDE 5: 234.3 pg/mL — AB (ref 0.0–100.0)

## 2016-01-20 MED ORDER — ALBUTEROL (5 MG/ML) CONTINUOUS INHALATION SOLN
10.0000 mg/h | INHALATION_SOLUTION | RESPIRATORY_TRACT | Status: DC
  Administered 2016-01-20: 10 mg/h via RESPIRATORY_TRACT
  Filled 2016-01-20: qty 20

## 2016-01-20 MED ORDER — ALBUTEROL SULFATE (2.5 MG/3ML) 0.083% IN NEBU: 5.0000 mg | INHALATION_SOLUTION | Freq: Once | RESPIRATORY_TRACT | Status: DC

## 2016-01-20 MED ORDER — SODIUM CHLORIDE 0.9 % IV BOLUS (SEPSIS)
500.0000 mL | Freq: Once | INTRAVENOUS | Status: AC
  Administered 2016-01-21: 500 mL via INTRAVENOUS

## 2016-01-20 MED ORDER — VANCOMYCIN HCL IN DEXTROSE 1-5 GM/200ML-% IV SOLN
1000.0000 mg | Freq: Once | INTRAVENOUS | Status: AC
  Administered 2016-01-20: 1000 mg via INTRAVENOUS
  Filled 2016-01-20: qty 200

## 2016-01-20 MED ORDER — CEFEPIME HCL 1 G IJ SOLR
1.0000 g | Freq: Once | INTRAMUSCULAR | Status: DC
  Filled 2016-01-20: qty 1

## 2016-01-20 NOTE — ED Provider Notes (Signed)
CSN: [id number]     Arrival date & time 01/20/16  2159 History   First MD Initiated Contact with Patient 01/20/16 2204     Chief Complaint  Patient presents with  . Respiratory Distress     (Consider location/radiation/quality/duration/timing/severity/associated sxs/prior Treatment) HPI 82 year old female who presents with cough and respiratory distress. History of COPD on 2 L [place], chronic atrial fibrillation on eliquis, HTN, and systolic HF EF [id number], CKD, and known LBBB. Recently admitted to hospital at end of Jan for influenza and COPD exacerbation. Has had 1 week increased cough, sob, sputum and congestion, worse over 2 days. With fever today. STarted on steroids and antibiotics today but EMS called due to worsening sob at assisted living facility. On their arrival, in distress, tripoding, 84% on 2 L [place]. Given solumedrol and breathing treatment and placed on CPAP.  No chest pain, lower extremity edema, orthopnea or PND Past Medical History  Diagnosis Date  . COPD (chronic obstructive pulmonary disease) ([place])     Never smoked. Questionable diagnosis.  . Atrial fibrillation ([place])   . Osteopenia   . Insomnia   . Hyponatremia   . Hypertension   . UTI (lower urinary tract infection)   . Bursitis of hip   . Trigeminal neuralgia   . Altered mental status 08/24/2012  . Anemia, with significant drop in H/H 08/24/2012  . CHF (congestive heart failure) ([place])   . Heart disease   . Acute facial pain   . LBBB (left bundle branch block)   . Nonischemic cardiomyopathy ([place])   . Dyslipidemia   . Kidney failure     Stage 3   Past Surgical History  Procedure Laterality Date  . Appendectomy    . Tonsillectomy and adenoidectomy    . Cardiac catheterization  07/04/2012    normal coronaries, EF 30-35%, elevated R & L heart pressures, reduced CO, mild pulm HTN (Dr. K. Mali Hilty)  . Transthoracic echocardiogram  2013    mild conc LVH, RV mildly dilated; LA severely dilated; RA mod dilated;  mild-mod mitral annular calcif, calcif of anterior & posterior MV leaflet, mod MR; mod-severe TR with RSVP 30-65mmHg; mild calcif of AV leaflets and mod aortic regurg; mild pulm valve regurg; pericardial effusion (right)  . Nm myocar perf wall motion  2011    lexiscan myoview - normal LV systolic function w/normal wall motion, no evidence of scar/ischemia  . Left and right heart catheterization with coronary angiogram N/A 07/04/2012    Procedure: LEFT AND RIGHT HEART CATHETERIZATION WITH CORONARY ANGIOGRAM;  Surgeon: Pixie Casino, MD;  Location: [facility] CATH LAB;  Service: Cardiovascular;  Laterality: N/A;   Family History  Problem Relation Age of Onset  . Stroke Father   . Heart attack Father   . Heart attack Brother   . Heart attack Brother   . Alzheimer's disease Mother    Social History  Substance Use Topics  . Smoking status: Never Smoker   . Smokeless tobacco: Never Used  . Alcohol Use: No   OB History    No data available     Review of Systems 10/14 systems reviewed and are negative other than those stated in the HPI    Allergies  Penicillins  Home Medications   Prior to Admission medications   Medication Sig Start Date End Date Taking? Authorizing Provider  apixaban (ELIQUIS) 2.5 MG TABS tablet Take 1 tablet (2.5 mg total) by mouth 2 (two) times daily. 11/16/15  Yes Pixie Casino,  MD  calcium-vitamin D (OSCAL WITH D) 500-200 MG-UNIT tablet Take 1 tablet by mouth daily with breakfast.   Yes Historical Provider, MD  carvedilol (COREG) 6.25 MG tablet Take 1 tablet (6.25 mg total) by mouth 2 (two) times daily with a meal. 12/08/15  Yes Pixie Casino, MD  docusate sodium 100 MG CAPS Take 200 mg by mouth at bedtime as needed. Patient taking differently: Take 100 mg by mouth at bedtime as needed (constipation).  08/26/12  Yes Orson Eva, MD  ferrous sulfate 325 (65 FE) MG tablet Take 325 mg by mouth daily with breakfast.   Yes Historical Provider, MD  furosemide (LASIX) 80 MG  tablet Take 1 tablet (80 mg total) by mouth daily. 11/10/15 09/21/18 Yes Luke K Kilroy, PA-C  gabapentin (NEURONTIN) 600 MG tablet Take 1 tablet (600 mg total) by mouth 3 (three) times daily. Please dose at 8am, 2pm and 8pm 01/19/16  Yes Marcial Pacas, MD  guaifenesin (ROBITUSSIN) 100 MG/5ML syrup Take 400 mg by mouth 3 (three) times daily as needed for cough.   Yes Historical Provider, MD  ipratropium-albuterol (DUONEB) 0.5-2.5 (3) MG/3ML SOLN Take 3 mLs by nebulization every 6 (six) hours as needed. Patient taking differently: Take 3 mLs by nebulization every 6 (six) hours as needed (shortness of breath).  01/04/16  Yes Liberty Handy, MD  levofloxacin (LEVAQUIN) 500 MG tablet Take 500 mg by mouth daily.   Yes Historical Provider, MD  potassium chloride SA (K-DUR,KLOR-CON) 20 MEQ tablet Take 20 mEq by mouth daily.   Yes Historical Provider, MD  predniSONE (DELTASONE) 10 MG tablet Take 40 mg by mouth daily with breakfast.   Yes Historical Provider, MD  Probiotic Product (ALIGN PO) Take 1 tablet by mouth daily.   Yes Historical Provider, MD  simvastatin (ZOCOR) 10 MG tablet Take 0.5 tablets (5 mg total) by mouth daily at 6 PM. 11/24/15  Yes Pixie Casino, MD  temazepam (RESTORIL) 15 MG capsule Take 15 mg by mouth at bedtime.    Yes Historical Provider, MD   BP 144/104 mmHg  Pulse 112  Resp 34  SpO2 98% Physical Exam Physical Exam  Nursing note and vitals reviewed. Constitutional: in mild respiratory distress, non-toxic, and in no acute distress Head: Normocephalic and atraumatic.  Mouth/Throat: Oropharynx is clear and dry.  Neck: Normal range of motion. Neck supple.  Cardiovascular: tachycardic rate and ireregular rhythm.  No edema Pulmonary/Chest: Tachypnea with slight conversational dyspnea, coarse rhonchorous breath sounds in low lungs with expiratory wheezes.  Abdominal: Soft. There is no tenderness. There is no rebound and no guarding.  Musculoskeletal: Normal range of motion.  Neurological:  Alert, no facial droop, fluent speech, moves all extremities symmetrically Skin: Skin is warm and dry.  Psychiatric: Cooperative  ED Course  Procedures (including critical care time) Labs Review Labs Reviewed  CBC WITH DIFFERENTIAL/PLATELET - Abnormal; Notable for the following:    RBC 3.37 (*)    Hemoglobin 10.3 (*)    HCT 32.4 (*)    All other components within normal limits  COMPREHENSIVE METABOLIC PANEL - Abnormal; Notable for the following:    Sodium 132 (*)    Chloride 91 (*)    Glucose, Bld 142 (*)    BUN 31 (*)    Creatinine, Ser 1.55 (*)    Total Protein 8.4 (*)    Albumin 3.2 (*)    GFR calc non Af Amer 30 (*)    GFR calc Af Amer 34 (*)    All  other components within normal limits  BRAIN NATRIURETIC PEPTIDE - Abnormal; Notable for the following:    B Natriuretic Peptide 234.3 (*)    All other components within normal limits  CULTURE, BLOOD (ROUTINE X 2)  CULTURE, BLOOD (ROUTINE X 2)  CULTURE, EXPECTORATED SPUTUM-ASSESSMENT  GRAM STAIN  URINALYSIS, ROUTINE W REFLEX MICROSCOPIC (NOT AT [facility])  INFLUENZA PANEL BY PCR (TYPE A & B, H1N1)  LACTIC ACID, PLASMA  LACTIC ACID, PLASMA  PROCALCITONIN  PROTIME-INR  APTT  STREP PNEUMONIAE URINARY ANTIGEN  I-STAT TROPOININ, ED  I-STAT CG4 LACTIC ACID, ED    Imaging Review Dg Chest 2 View  01/20/2016  CLINICAL DATA:  Cough, congestion, shortness of breath, low-grade fever possible pneumonia EXAM: CHEST  2 VIEW COMPARISON:  01/03/2016 FINDINGS: Cardiomegaly again noted. No pulmonary edema. Mild perihilar bronchitic changes. There is streaky right infrahilar infiltrate or bronchitic changes. Thoracic spine osteopenia. Mild degenerative changes lower thoracic and lumbar spine. IMPRESSION: Mild perihilar bronchitic changes. Streaky infiltrate or bronchitic changes right infrahilar region. No pulmonary edema. Electronically Signed   By: Lahoma Crocker M.D.   On: 01/20/2016 16:20   Dg Chest Portable 1 View  01/20/2016  CLINICAL DATA:   82 year old female with cough and shortness of breath EXAM: PORTABLE CHEST 1 VIEW COMPARISON:  Radiograph dated 01/20/2016 FINDINGS: Single-view of the chest demonstrates emphysematous changes of the lungs. There are mild bronchiectatic changes. No focal consolidation, pleural effusion, or pneumothorax. Stable cardiomegaly. No acute osseous pathology. IMPRESSION: No active disease. Electronically Signed   By: Anner Crete M.D.   On: 01/20/2016 22:29   I have personally reviewed and evaluated these images and lab results as part of my medical decision-making.   EKG Interpretation   Date/Time:  Thursday January 20 2016 22:07:15 EST Ventricular Rate:  108 PR Interval:    QRS Duration: 128 QT Interval:  393 QTC Calculation: 527 R Axis:   -91 Text Interpretation:  Atrial fibrillation Left bundle branch block No  significant change since last tracing Confirmed by Dalinda Heidt MD, Allin Frix [phone]) on  01/20/2016 11:29:36 PM     CRITICAL CARE Performed by: Forde Dandy   Total critical care time: 35 minutes  Critical care time was exclusive of separately billable procedures and treating other patients.  Critical care was necessary to treat or prevent imminent or life-threatening deterioration.  Critical care was time spent personally by me on the following activities: development of treatment plan with patient and/or surrogate as well as nursing, discussions with consultants, evaluation of patient's response to treatment, examination of patient, obtaining history from patient or surrogate, ordering and performing treatments and interventions, ordering and review of laboratory studies, ordering and review of radiographic studies, pulse oximetry and re-evaluation of patient's condition.  MDM   Final diagnoses:  COPD exacerbation ([place])  Respiratory infection      82 year old female with history of COPD, chronic atrophic fibrillation and systolic heart failure who presents with shortness of breath  and cough. On arrival with tachypnea and mild conversational dyspnea. Does not tolerate CPAP well, in its place on 2 L nasal cannula with normal oxygenation and mild increased work of breathing. Rhonchorous breath sounds with diffuse expiratory wheezes. Does not appear fluid overloaded. Chest x-ray without fluid or infiltrate.  Likely viral illness causing acute COPD exacerbation. Is empirically covered with antibiotics prior to this for potential hospital-acquired pneumonia given recent hospitalization with fever. Had received steroids prior to arrival. Placed on continuous albuterol, and on reevaluation with  Improved work of  breathing but with persistent wheezing. Negative troponin and EKG without acute ischemic changes, unlikely ACS related symptoms. On chronic Eliquis and low suspicion for PE.   Discussed with internal medicine and admitted for ongoing management.  Forde Dandy, MD 01/21/16 [id number]

## 2016-01-20 NOTE — ED Notes (Signed)
Pt here by ems for resp distress, pt complains of resp distress x 1 week and worse today, pt seen pmd at asst living ([facility])  And had chest xray but no results yet, was started on abx and steroids but tonight became increasingly worse. With ems ronchi through out and initial sats at 4 liters were 84 % pt started on cpap and 1 sl ntg given and sats 9 % oncpap with wheezing.. Pt also given solumedrol  And duoneb and then increased to 97 %. Per ems cough noted initiallly clear and progressivbly now yellow

## 2016-01-20 NOTE — ED Notes (Signed)
Xray called for portable  

## 2016-01-21 ENCOUNTER — Encounter (HOSPITAL_COMMUNITY): Payer: Self-pay | Admitting: *Deleted

## 2016-01-21 DIAGNOSIS — I482 Chronic atrial fibrillation: Secondary | ICD-10-CM

## 2016-01-21 DIAGNOSIS — E785 Hyperlipidemia, unspecified: Secondary | ICD-10-CM | POA: Diagnosis present

## 2016-01-21 DIAGNOSIS — D509 Iron deficiency anemia, unspecified: Secondary | ICD-10-CM | POA: Diagnosis present

## 2016-01-21 DIAGNOSIS — Z66 Do not resuscitate: Secondary | ICD-10-CM | POA: Diagnosis present

## 2016-01-21 DIAGNOSIS — M8580 Other specified disorders of bone density and structure, unspecified site: Secondary | ICD-10-CM | POA: Diagnosis present

## 2016-01-21 DIAGNOSIS — I130 Hypertensive heart and chronic kidney disease with heart failure and stage 1 through stage 4 chronic kidney disease, or unspecified chronic kidney disease: Secondary | ICD-10-CM | POA: Diagnosis present

## 2016-01-21 DIAGNOSIS — I447 Left bundle-branch block, unspecified: Secondary | ICD-10-CM | POA: Diagnosis present

## 2016-01-21 DIAGNOSIS — J9621 Acute and chronic respiratory failure with hypoxia: Secondary | ICD-10-CM | POA: Diagnosis present

## 2016-01-21 DIAGNOSIS — R0600 Dyspnea, unspecified: Secondary | ICD-10-CM | POA: Diagnosis not present

## 2016-01-21 DIAGNOSIS — J441 Chronic obstructive pulmonary disease with (acute) exacerbation: Secondary | ICD-10-CM | POA: Diagnosis not present

## 2016-01-21 DIAGNOSIS — Z9981 Dependence on supplemental oxygen: Secondary | ICD-10-CM | POA: Diagnosis not present

## 2016-01-21 DIAGNOSIS — G500 Trigeminal neuralgia: Secondary | ICD-10-CM | POA: Diagnosis present

## 2016-01-21 DIAGNOSIS — I428 Other cardiomyopathies: Secondary | ICD-10-CM | POA: Diagnosis present

## 2016-01-21 DIAGNOSIS — Z7901 Long term (current) use of anticoagulants: Secondary | ICD-10-CM

## 2016-01-21 DIAGNOSIS — E871 Hypo-osmolality and hyponatremia: Secondary | ICD-10-CM

## 2016-01-21 DIAGNOSIS — I5043 Acute on chronic combined systolic (congestive) and diastolic (congestive) heart failure: Secondary | ICD-10-CM | POA: Diagnosis present

## 2016-01-21 DIAGNOSIS — I071 Rheumatic tricuspid insufficiency: Secondary | ICD-10-CM | POA: Diagnosis present

## 2016-01-21 DIAGNOSIS — N183 Chronic kidney disease, stage 3 (moderate): Secondary | ICD-10-CM | POA: Diagnosis present

## 2016-01-21 DIAGNOSIS — I429 Cardiomyopathy, unspecified: Secondary | ICD-10-CM | POA: Diagnosis present

## 2016-01-21 DIAGNOSIS — Z79899 Other long term (current) drug therapy: Secondary | ICD-10-CM | POA: Diagnosis not present

## 2016-01-21 DIAGNOSIS — G4700 Insomnia, unspecified: Secondary | ICD-10-CM | POA: Diagnosis present

## 2016-01-21 DIAGNOSIS — I272 Other secondary pulmonary hypertension: Secondary | ICD-10-CM | POA: Diagnosis present

## 2016-01-21 DIAGNOSIS — E876 Hypokalemia: Secondary | ICD-10-CM | POA: Diagnosis not present

## 2016-01-21 DIAGNOSIS — I959 Hypotension, unspecified: Secondary | ICD-10-CM | POA: Diagnosis not present

## 2016-01-21 DIAGNOSIS — J121 Respiratory syncytial virus pneumonia: Secondary | ICD-10-CM | POA: Diagnosis present

## 2016-01-21 DIAGNOSIS — J209 Acute bronchitis, unspecified: Secondary | ICD-10-CM | POA: Insufficient documentation

## 2016-01-21 DIAGNOSIS — Z7952 Long term (current) use of systemic steroids: Secondary | ICD-10-CM | POA: Diagnosis not present

## 2016-01-21 LAB — URINE MICROSCOPIC-ADD ON

## 2016-01-21 LAB — LACTIC ACID, PLASMA: Lactic Acid, Venous: 1.2 mmol/L (ref 0.5–2.0)

## 2016-01-21 LAB — URINALYSIS, ROUTINE W REFLEX MICROSCOPIC
BILIRUBIN URINE: NEGATIVE
Glucose, UA: NEGATIVE mg/dL
KETONES UR: NEGATIVE mg/dL
LEUKOCYTES UA: NEGATIVE
NITRITE: NEGATIVE
PH: 5.5 (ref 5.0–8.0)
PROTEIN: NEGATIVE mg/dL
Specific Gravity, Urine: 1.015 (ref 1.005–1.030)

## 2016-01-21 LAB — INFLUENZA PANEL BY PCR (TYPE A & B)
H1N1FLUPCR: NOT DETECTED
INFLBPCR: NEGATIVE
Influenza A By PCR: NEGATIVE

## 2016-01-21 LAB — PROTIME-INR
INR: 1.63 — ABNORMAL HIGH (ref 0.00–1.49)
PROTHROMBIN TIME: 19.4 s — AB (ref 11.6–15.2)

## 2016-01-21 LAB — APTT: aPTT: 32 seconds (ref 24–37)

## 2016-01-21 LAB — PROCALCITONIN: PROCALCITONIN: 0.14 ng/mL

## 2016-01-21 MED ORDER — CARVEDILOL 6.25 MG PO TABS
6.2500 mg | ORAL_TABLET | Freq: Two times a day (BID) | ORAL | Status: DC
  Administered 2016-01-21 – 2016-01-26 (×10): 6.25 mg via ORAL
  Filled 2016-01-21 (×11): qty 1

## 2016-01-21 MED ORDER — AZITHROMYCIN 250 MG PO TABS: 250.0000 mg | ORAL_TABLET | Freq: Every day | ORAL | Status: DC

## 2016-01-21 MED ORDER — AZITHROMYCIN 250 MG PO TABS: 500.0000 mg | ORAL_TABLET | Freq: Every day | ORAL | Status: DC

## 2016-01-21 MED ORDER — ALBUTEROL SULFATE (2.5 MG/3ML) 0.083% IN NEBU
2.5000 mg | INHALATION_SOLUTION | Freq: Four times a day (QID) | RESPIRATORY_TRACT | Status: DC | PRN
  Administered 2016-01-23: 2.5 mg via RESPIRATORY_TRACT
  Filled 2016-01-21: qty 3

## 2016-01-21 MED ORDER — LEVALBUTEROL HCL 1.25 MG/0.5ML IN NEBU: 1.2500 mg | INHALATION_SOLUTION | Freq: Four times a day (QID) | RESPIRATORY_TRACT | Status: DC

## 2016-01-21 MED ORDER — SIMVASTATIN 10 MG PO TABS
5.0000 mg | ORAL_TABLET | Freq: Every day | ORAL | Status: DC
  Administered 2016-01-21 – 2016-01-25 (×5): 5 mg via ORAL
  Filled 2016-01-21 (×6): qty 1

## 2016-01-21 MED ORDER — DM-GUAIFENESIN ER 30-600 MG PO TB12
1.0000 | ORAL_TABLET | Freq: Two times a day (BID) | ORAL | Status: DC
  Administered 2016-01-21: 1 via ORAL
  Filled 2016-01-21: qty 1

## 2016-01-21 MED ORDER — POTASSIUM CHLORIDE CRYS ER 20 MEQ PO TBCR: 20.0000 meq | EXTENDED_RELEASE_TABLET | Freq: Every day | ORAL | Status: DC

## 2016-01-21 MED ORDER — IPRATROPIUM-ALBUTEROL 0.5-2.5 (3) MG/3ML IN SOLN
3.0000 mL | Freq: Four times a day (QID) | RESPIRATORY_TRACT | Status: DC
  Administered 2016-01-21 (×3): 3 mL via RESPIRATORY_TRACT
  Filled 2016-01-21 (×3): qty 3

## 2016-01-21 MED ORDER — FERROUS SULFATE 325 (65 FE) MG PO TABS
325.0000 mg | ORAL_TABLET | Freq: Every day | ORAL | Status: DC
  Administered 2016-01-21 – 2016-01-26 (×6): 325 mg via ORAL
  Filled 2016-01-21 (×6): qty 1

## 2016-01-21 MED ORDER — FUROSEMIDE 80 MG PO TABS
80.0000 mg | ORAL_TABLET | Freq: Every day | ORAL | Status: DC
  Administered 2016-01-21 – 2016-01-26 (×6): 80 mg via ORAL
  Filled 2016-01-21 (×6): qty 1

## 2016-01-21 MED ORDER — METHYLPREDNISOLONE SODIUM SUCC 125 MG IJ SOLR
60.0000 mg | Freq: Three times a day (TID) | INTRAMUSCULAR | Status: DC
  Administered 2016-01-21: 60 mg via INTRAVENOUS
  Filled 2016-01-21: qty 2

## 2016-01-21 MED ORDER — IPRATROPIUM BROMIDE 0.02 % IN SOLN: 0.5000 mg | Freq: Four times a day (QID) | RESPIRATORY_TRACT | Status: DC

## 2016-01-21 MED ORDER — ACETAMINOPHEN 325 MG PO TABS: 650.0000 mg | ORAL_TABLET | Freq: Four times a day (QID) | ORAL | Status: DC | PRN

## 2016-01-21 MED ORDER — IPRATROPIUM BROMIDE 0.02 % IN SOLN: 0.5000 mg | RESPIRATORY_TRACT | Status: DC

## 2016-01-21 MED ORDER — GUAIFENESIN-DM 100-10 MG/5ML PO SYRP
5.0000 mL | ORAL_SOLUTION | ORAL | Status: DC
  Administered 2016-01-21 – 2016-01-26 (×25): 5 mL via ORAL
  Filled 2016-01-21 (×27): qty 5

## 2016-01-21 MED ORDER — CALCIUM CARBONATE-VITAMIN D 500-200 MG-UNIT PO TABS: 1.0000 | ORAL_TABLET | Freq: Every day | ORAL | Status: DC

## 2016-01-21 MED ORDER — DOCUSATE SODIUM 100 MG PO CAPS: 100.0000 mg | ORAL_CAPSULE | Freq: Every evening | ORAL | Status: DC | PRN

## 2016-01-21 MED ORDER — PREDNISONE 20 MG PO TABS
40.0000 mg | ORAL_TABLET | Freq: Every day | ORAL | Status: AC
  Administered 2016-01-21 – 2016-01-25 (×5): 40 mg via ORAL
  Filled 2016-01-21 (×5): qty 2

## 2016-01-21 MED ORDER — IPRATROPIUM-ALBUTEROL 0.5-2.5 (3) MG/3ML IN SOLN
3.0000 mL | Freq: Three times a day (TID) | RESPIRATORY_TRACT | Status: DC
  Administered 2016-01-22 (×3): 3 mL via RESPIRATORY_TRACT
  Filled 2016-01-21 (×4): qty 3

## 2016-01-21 MED ORDER — DEXTROSE 5 % IV SOLN
1.0000 g | Freq: Every day | INTRAVENOUS | Status: DC
  Administered 2016-01-21 – 2016-01-25 (×5): 1 g via INTRAVENOUS
  Filled 2016-01-21 (×6): qty 1

## 2016-01-21 MED ORDER — APIXABAN 2.5 MG PO TABS
2.5000 mg | ORAL_TABLET | Freq: Two times a day (BID) | ORAL | Status: DC
  Administered 2016-01-21 – 2016-01-26 (×11): 2.5 mg via ORAL
  Filled 2016-01-21 (×11): qty 1

## 2016-01-21 MED ORDER — GUAIFENESIN ER 600 MG PO TB12: 600.0000 mg | ORAL_TABLET | Freq: Two times a day (BID) | ORAL | Status: DC

## 2016-01-21 MED ORDER — CEFTAZIDIME 1 G IJ SOLR: 1.0000 g | Freq: Three times a day (TID) | INTRAMUSCULAR | Status: DC

## 2016-01-21 MED ORDER — GABAPENTIN 600 MG PO TABS
600.0000 mg | ORAL_TABLET | Freq: Three times a day (TID) | ORAL | Status: DC
  Administered 2016-01-21 – 2016-01-26 (×17): 600 mg via ORAL
  Filled 2016-01-21 (×17): qty 1

## 2016-01-21 MED ORDER — TEMAZEPAM 15 MG PO CAPS
15.0000 mg | ORAL_CAPSULE | Freq: Every day | ORAL | Status: DC
  Administered 2016-01-21 (×2): 15 mg via ORAL
  Filled 2016-01-21 (×2): qty 1

## 2016-01-21 NOTE — Evaluation (Addendum)
Physical Therapy Evaluation Patient Details Name: Angela Franco MRN: [id number] DOB: 1929-10-18 Today's Date: 01/21/2016   History of Present Illness  82 yo female with onset of acute respiratory failure after onset cough and SOB.  PMHx:  COPD, a-fib, CHF with EF 40-45%, L BBB, HTN  Clinical Impression  Pt was seen for evaluation of her mobility with inability to safely remove her O2 for walk, did drop signficantly with RW gait on hallway.  Her plan is to get back to [place] but is not medically stable with sat drops to safely transition.  Will continue therapy with her for gait and monitoring of her vitals with progression of strengthening and distance as she tolerates.     Follow Up Recommendations SNF    Equipment Recommendations  Rolling walker with 5" wheels (unless pt has adequate equipment)    Recommendations for Other Services Rehab consult     Precautions / Restrictions Precautions Precautions: Fall Precaution Comments: Contact. Droplet Restrictions Weight Bearing Restrictions: No      Mobility  Bed Mobility Overal bed mobility: Modified Independent                Transfers Overall transfer level: Needs assistance Equipment used: Rolling walker (2 wheeled);1 person hand held assist Transfers: Sit to/from [facility] Sit to Stand: Min assist Stand pivot transfers: Min guard       General transfer comment: needed help to steady with initial standing then walker was used  Ambulation/Gait Ambulation/Gait assistance: Min guard Ambulation Distance (Feet): 120 Feet Assistive device: Rolling walker (2 wheeled);1 person hand held assist Gait Pattern/deviations: Step-through pattern;Narrow base of support;Trunk flexed;Drifts right/left Gait velocity: decreased Gait velocity interpretation: Below normal speed for age/gender General Gait Details: walker used as her ability to control O2 is down on 2L O2  Stairs            Wheelchair  Mobility    Modified Rankin (Stroke Patients Only)       Balance Overall balance assessment: Needs assistance Sitting-balance support: Feet supported Sitting balance-Leahy Scale: Good   Postural control: Posterior lean Standing balance support: Bilateral upper extremity supported Standing balance-Leahy Scale: Fair                               Pertinent Vitals/Pain Pain Assessment: No/denies pain    Home Living Family/patient expects to be discharged to:: Assisted living               Home Equipment: Walker - 2 wheels;Grab bars - toilet Additional Comments: Uses O2 PRN    Prior Function Level of Independence: Independent with assistive device(s)               Hand Dominance   Dominant Hand: Right    Extremity/Trunk Assessment   Upper Extremity Assessment: Overall WFL for tasks assessed           Lower Extremity Assessment: Generalized weakness      Cervical / Trunk Assessment: Normal  Communication   Communication: No difficulties  Cognition Arousal/Alertness: Awake/alert Behavior During Therapy: WFL for tasks assessed/performed Overall Cognitive Status: Within Functional Limits for tasks assessed                      General Comments General comments (skin integrity, edema, etc.): supplemental O2 at 2L was keeping pt at 90-91% at best, down to 83% after walk with pulse 117    Exercises  Assessment/Plan    PT Assessment Patient needs continued PT services  PT Diagnosis Generalized weakness;Difficulty walking   PT Problem List Decreased strength;Decreased range of motion;Decreased activity tolerance;Decreased balance;Decreased mobility;Decreased coordination;Decreased knowledge of use of DME;Decreased safety awareness;Decreased knowledge of precautions;Cardiopulmonary status limiting activity  PT Treatment Interventions DME instruction;Gait training;Functional mobility training;Therapeutic activities;Therapeutic  exercise;Balance training;Neuromuscular re-education;Patient/family education   PT Goals (Current goals can be found in the Care Plan section) Acute Rehab PT Goals Patient Stated Goal: none stated PT Goal Formulation: With patient Time For Goal Achievement: 02/04/16 Potential to Achieve Goals: Good    Frequency Min 2X/week   Barriers to discharge Other (comment) (will need 24/7 help to monitor sats initially if leaving soo) in ALF where help will need to be confirmed    Co-evaluation               End of Session Equipment Utilized During Treatment: Gait belt;Oxygen Activity Tolerance: Patient tolerated treatment well Patient left: in bed;with call bell/phone within reach Nurse Communication: Mobility status    Functional Assessment Tool Used: clinical judgment Functional Limitation: Mobility: Walking and moving around Mobility: Walking and Moving Around Current Status [id number]): At least 20 percent but less than 40 percent impaired, limited or restricted Mobility: Walking and Moving Around Goal Status [phone]): At least 1 percent but less than 20 percent impaired, limited or restricted    Time: [id number] PT Time Calculation (min) (ACUTE ONLY): 27 min   Charges:   PT Evaluation $PT Eval Moderate Complexity: 1 Procedure PT Treatments $Gait Training: 8-22 mins   PT G Codes:   PT G-Codes **NOT FOR INPATIENT CLASS** Functional Assessment Tool Used: clinical judgment Functional Limitation: Mobility: Walking and moving around Mobility: Walking and Moving Around Current Status [id number]): At least 20 percent but less than 40 percent impaired, limited or restricted Mobility: Walking and Moving Around Goal Status [phone]): At least 1 percent but less than 20 percent impaired, limited or restricted    Ramond Dial 01/21/2016, 12:25 PM   Mee Hives, PT MS Acute Rehab Dept. Number: [facility] [id number] and [place] [phone]

## 2016-01-21 NOTE — Care Management Note (Addendum)
Case Management Note Marvetta Gibbons RN, BSN Unit 2W-Case Manager [phone]  Patient Details  Name: Winefred Dieleman MRN: [id number] Date of Birth: [birth year]  Subjective/Objective:    Pt  Admitted with COPD              Action/Plan: PTA pt lived at [facility]- was active with [facility] for [place] for disease management- will need resumption order for [facility] when ready for discharge- CM to follow for d/c needs---1450- per conversation with pt and daughter- [name] plan is for pt to go to [facility]- family has made arrangements with [facility] there prior to admission- CSW has been consulted to assist with this-   Expected Discharge Date:                  Expected Discharge Plan:  Skilled Facility  In-House Referral:   Social work  Discharge planning Services  CM Consult  Post Acute Care Choice:   Choice offered to:     DME Arranged:    DME Agency:     HH Arranged:    [place] Agency:  [place]  Status of Service:  In process, will continue to follow  Medicare Important Message Given:    Date Medicare IM Given:    Medicare IM give by:    Date Additional Medicare IM Given:    Additional Medicare Important Message give by:     If discussed at [place] of Stay Meetings, dates discussed:    Additional Comments:  Dawayne Patricia, RN 01/21/2016, 1:56 PM

## 2016-01-21 NOTE — Progress Notes (Signed)
Pt. States she takes breathing tx. At home TID. Pt. States she does not like to be awaken throughout the night for tx. Pt. Placed on home regimen and also has prn tx. If need.

## 2016-01-21 NOTE — Progress Notes (Signed)
Utilization review completed. Jasun Gasparini, RN, BSN. 

## 2016-01-21 NOTE — Progress Notes (Signed)
Subjective: NAEON.  Patient reports feeling much better than when she first came to the ED.  She has no questions and understands the plan.  Objective: Vital signs in last 24 hours: Filed Vitals:   01/21/16 0500 01/21/16 0601 01/21/16 0621 01/21/16 1017  BP: 127/70  135/95   Pulse: 94  97   Temp:  98.9 F (37.2 C) 97.7 F (36.5 C)   TempSrc:  Oral Oral   Resp: 31  24   Height:   5\' 3"  (1.6 m)   Weight:   132 lb 0.9 oz (59.9 kg)   SpO2: 89% 93% 91% 88%   Weight change:   Intake/Output Summary (Last 24 hours) at 01/21/16 1039 Last data filed at 01/21/16 [id number]  Gross per 24 hour  Intake    120 ml  Output    200 ml  Net    -80 ml   Physical Exam  Constitutional: She is oriented to person, place, and time.  Frail, elderly female, sitting on side of bed, NAD.  HENT:  Head: Normocephalic and atraumatic.  Eyes: EOM are normal. No scleral icterus.  Neck: No tracheal deviation present.  Cardiovascular: Normal rate, regular rhythm, normal heart sounds and intact distal pulses.   Pulmonary/Chest: No stridor.  Poor inspiratory effort.  Decreased air movement throughout.  Coarse inspiratory breath sounds and scattered wheezes throughout.  High pitched tinkling sounds appreciated in posterior right middle zone.  Musculoskeletal: She exhibits no edema.  Neurological: She is oriented to person, place, and time.  Skin: Skin is warm and dry.    Lab Results: Basic Metabolic Panel:  Recent Labs Lab 01/20/16 2240  NA 132*  K 3.9  CL 91*  CO2 29  GLUCOSE 142*  BUN 31*  CREATININE 1.55*  CALCIUM 9.2   Liver Function Tests:  Recent Labs Lab 01/20/16 2240  AST 29  ALT 17  ALKPHOS 78  BILITOT 0.6  PROT 8.4*  ALBUMIN 3.2*   No results for input(s): LIPASE, AMYLASE in the last 168 hours. No results for input(s): AMMONIA in the last 168 hours. CBC:  Recent Labs Lab 01/20/16 2240  WBC 6.1  NEUTROABS 4.9  HGB 10.3*  HCT 32.4*  MCV 96.1  PLT 172   Cardiac  Enzymes: No results for input(s): CKTOTAL, CKMB, CKMBINDEX, TROPONINI in the last 168 hours. BNP: No results for input(s): PROBNP in the last 168 hours. D-Dimer: No results for input(s): DDIMER in the last 168 hours. CBG: No results for input(s): GLUCAP in the last 168 hours. Hemoglobin A1C: No results for input(s): HGBA1C in the last 168 hours. Fasting Lipid Panel: No results for input(s): CHOL, HDL, LDLCALC, TRIG, CHOLHDL, LDLDIRECT in the last 168 hours. Thyroid Function Tests: No results for input(s): TSH, T4TOTAL, FREET4, T3FREE, THYROIDAB in the last 168 hours. Coagulation:  Recent Labs Lab 01/21/16 0116  LABPROT 19.4*  INR 1.63*   Anemia Panel: No results for input(s): VITAMINB12, FOLATE, FERRITIN, TIBC, IRON, RETICCTPCT in the last 168 hours. Urine Drug Screen: Drugs of Abuse     Component Value Date/Time   LABOPIA POSITIVE* 08/24/2012 1119   COCAINSCRNUR NONE DETECTED 08/24/2012 1119   LABBENZ NONE DETECTED 08/24/2012 1119   AMPHETMU NONE DETECTED 08/24/2012 1119   THCU NONE DETECTED 08/24/2012 1119   LABBARB NONE DETECTED 08/24/2012 1119    Alcohol Level: No results for input(s): ETH in the last 168 hours. Urinalysis:  Recent Labs Lab 01/21/16 0631  COLORURINE YELLOW  LABSPEC 1.015  PHURINE 5.5  GLUCOSEU NEGATIVE  HGBUR TRACE*  BILIRUBINUR NEGATIVE  KETONESUR NEGATIVE  PROTEINUR NEGATIVE  NITRITE NEGATIVE  LEUKOCYTESUR NEGATIVE   Misc. Labs:   Micro Results: No results found for this or any previous visit (from the past 240 hour(s)). Studies/Results: Dg Chest 2 View  01/20/2016  CLINICAL DATA:  Cough, congestion, shortness of breath, low-grade fever possible pneumonia EXAM: CHEST  2 VIEW COMPARISON:  01/03/2016 FINDINGS: Cardiomegaly again noted. No pulmonary edema. Mild perihilar bronchitic changes. There is streaky right infrahilar infiltrate or bronchitic changes. Thoracic spine osteopenia. Mild degenerative changes lower thoracic and lumbar  spine. IMPRESSION: Mild perihilar bronchitic changes. Streaky infiltrate or bronchitic changes right infrahilar region. No pulmonary edema. Electronically Signed   By: Lahoma Crocker M.D.   On: 01/20/2016 16:20   Dg Chest Portable 1 View  01/20/2016  CLINICAL DATA:  82 year old female with cough and shortness of breath EXAM: PORTABLE CHEST 1 VIEW COMPARISON:  Radiograph dated 01/20/2016 FINDINGS: Single-view of the chest demonstrates emphysematous changes of the lungs. There are mild bronchiectatic changes. No focal consolidation, pleural effusion, or pneumothorax. Stable cardiomegaly. No acute osseous pathology. IMPRESSION: No active disease. Electronically Signed   By: Anner Crete M.D.   On: 01/20/2016 22:29   Medications: I have reviewed the patient's current medications. Scheduled Meds: . apixaban  2.5 mg Oral BID  . carvedilol  6.25 mg Oral BID WC  . cefTAZidime (FORTAZ)  IV  1 g Intravenous Daily  . ferrous sulfate  325 mg Oral Q breakfast  . furosemide  80 mg Oral Daily  . gabapentin  600 mg Oral TID  . guaiFENesin  600 mg Oral BID  . guaiFENesin-dextromethorphan  5 mL Oral Q4H  . ipratropium-albuterol  3 mL Nebulization Q6H  . predniSONE  40 mg Oral Q breakfast  . simvastatin  5 mg Oral q1800  . temazepam  15 mg Oral QHS   Continuous Infusions:  PRN Meds:. Assessment/Plan: Principal Problem:   Acute on chronic respiratory failure with hypoxia [facility]) Active Problems:   Systolic and diastolic CHF, acute on chronic,    Normocytic anemia   Atrial fibrillation, chronic (HCC)   Moderate to severe pulmonary hypertension, PA pressure in 70s June 2013   Non-ischemic cardiomyopathy EF improved from 30-35% to 40-45% by Echo in 07/2012   LBBB (left bundle branch block)   HTN (hypertension)   Chronic renal impairment, stage 3 (moderate)   COPD exacerbation ([place])  Ms. Weiman is a 82 year old female with a past medical history of COPD, A. fib, hypertension, CHF, left bundle branch block,  nonischemic cardiomyopathy, and hyperlipidemia presenting with a hospital with a complaint of shortness of breath, cough, chest congestion, and fevers for the past 3 days.  Acute on chronic hypoxemic respiratory failure 2/2 COPD exacerbation: Worsening shortness of breath, cough productive of yellow sputum, and fevers for the past 3 days. She was recently discharged on 01/04/2016 after being treated for COPD exacerbation and influenza. Physical exam remarkable for diffuse rhonchi and wheezing. Not likely CHF exacerbation as patient did not appear fluid overloaded on lung exam and BNP only modestly elevated at 234.3. She was given a 500cc bolus of IVF and vancomycin 1g in the ED.  - Telemetry - Ceftazidime IV 1 g daily (2/17 - ?) - Prednisone 40 mg daily (2/17 - 2/21) - Robitussin-DM - Duonebs [ ]  Blood cultures -PT/OT eval and treat  - Outpatient PFTs  Combined CHF: Echo from April 2016 showing ejection fraction of 40-45%, mild aortic regurgitation, mild  mitral regurgitation, moderate dilation of the left atrium moderate dilation the right ventricle. Right atrium was severely dilated and there was moderate tricuspid regurgitation. Pulmonary artery pressure elevated at 45 mmHg. - Lasix 80 mg daily  - Coreg 6.25 mg BID  Chronic atrial fibrillation - Eliquis 2.5 mg daily  -Continue Coreg 6.25 mg BID  Hypertension -Coreg 6.25 mg BID  Hyperlipidemia - Zocor 5 mg daily  Stage III CKD: Serum creatinine 1.5 on admission with a baseline around 1.5.   Iron deficiency anemia:Hgb 10.3, normal MCV. Iron low (38) and ferritin (26) in 2013.  - Ferrous sulfate 325 mg daily   Trigeminal neuralgia - Trileptal  Hyponatremia: chronic, likely 2/2 Lasix and CHF. - CTM  Insomnia  - Restoril 15 mg QHS  Diet: Heart healthy  DVT ppx: Eliquis   Dispo: Disposition is deferred at this time, awaiting improvement of current medical problems.  Anticipated discharge in approximately 1-2 day(s).    The patient does have a current PCP Marylynn Pearson, MD) and does need an [facility] hospital follow-up appointment after discharge.  The patient does not have transportation limitations that hinder transportation to clinic appointments.  .Services Needed at time of discharge: Y = Yes, Blank = No PT:   OT:   RN:   Equipment:   Other:     LOS: 0 days   Iline Oven, MD 01/21/2016, 10:39 AM

## 2016-01-21 NOTE — Care Management Obs Status (Signed)
[place] NOTIFICATION   Patient Details  Name: Angela Franco MRN: [id number] Date of Birth: 03/14/1929   Medicare Observation Status Notification Given:  Yes (CC44 given due to status change in ED to INPT to obs )    Dawayne Patricia, RN 01/21/2016, 2:54 PM

## 2016-01-21 NOTE — Evaluation (Signed)
Occupational Therapy Evaluation Patient Details Name: Angela Franco MRN: [id number] DOB: 1929/05/08 Today's Date: 01/21/2016    History of Present Illness 82 yo female with onset of acute respiratory failure after onset cough and SOB.  PMHx:  COPD, a-fib, CHF with EF 40-45%, L BBB, HTN   Clinical Impression   Pt typically can ambulate with her walker and perform ADL. She demonstrates poor activity tolerance interfering with ability to perform at her baseline.  Pt will need post acute rehab prior to return home to her ILF.    Follow Up Recommendations  SNF;Supervision/Assistance - 24 hour    Equipment Recommendations       Recommendations for Other Services       Precautions / Restrictions Precautions Precautions: Fall Precaution Comments: Contact. Droplet Restrictions Weight Bearing Restrictions: No      Mobility Bed Mobility              General bed mobility comments: pt in chair  Transfers Overall transfer level: Needs assistance Equipment used: Rolling walker (2 wheeled) Transfers: Sit to/from Stand Sit to Stand: Min guard        General transfer comment: min guard from chair and toilet, decreased control of descent to toilet    Balance Overall balance assessment: Needs assistance Sitting-balance support: Feet supported Sitting balance-Leahy Scale: Good   Postural control: Posterior lean Standing balance support: Bilateral upper extremity supported Standing balance-Leahy Scale: Fair                              ADL Overall ADL's : Needs assistance/impaired Eating/Feeding: Independent;Sitting   Grooming: Wash/dry hands;Standing;Supervision/safety   Upper Body Bathing: Set up;Sitting   Lower Body Bathing: Min guard;Sit to/from stand   Upper Body Dressing : Set up;Sitting   Lower Body Dressing: Min guard;Sit to/from stand   Toilet Transfer: Min guard;Ambulation;RW;Comfort height toilet   Toileting- Clothing Manipulation and  Hygiene: Supervision/safety;Sit to/from stand       Functional mobility during ADLs: Min guard;Rolling walker (assist to manage 02 line) General ADL Comments: Pt walks very quickly. Poor activity tolerance. Instructed in pursed lip breathing and to sit upright when recovering from activity.     Vision     Perception     Praxis      Pertinent Vitals/Pain Pain Assessment: No/denies pain     Hand Dominance Right   Extremity/Trunk Assessment Upper Extremity Assessment Upper Extremity Assessment: Overall WFL for tasks assessed (arthritic changes in hands)   Lower Extremity Assessment Lower Extremity Assessment: Defer to PT evaluation   Cervical / Trunk Assessment Cervical / Trunk Assessment: Normal   Communication Communication Communication: No difficulties   Cognition Arousal/Alertness: Awake/alert Behavior During Therapy: WFL for tasks assessed/performed Overall Cognitive Status: Within Functional Limits for tasks assessed                     General Comments       Exercises       Shoulder Instructions      Home Living Family/patient expects to be discharged to:: Private residence Living Arrangements: Alone Available Help at Discharge: Family;Available PRN/intermittently Type of Home: Independent living facility Home Access: Elevator     Home Layout: One level     Bathroom Shower/Tub: Occupational psychologist: Standard     Home Equipment: Environmental consultant - 2 wheels;Grab bars - toilet;Shower seat - built in;Hand held shower head;Grab bars - tub/shower   Additional Comments:  Uses O2 PRN      Prior Functioning/Environment Level of Independence: Independent with assistive device(s)        Comments: use of RW and O2 when pt needs. walks to dining hall and attends multiple events at ALF, had done group exercise classes in the past and will return to that    OT Diagnosis: Generalized weakness   OT Problem List: Impaired balance (sitting  and/or standing);Decreased activity tolerance;Cardiopulmonary status limiting activity   OT Treatment/Interventions: Self-care/ADL training;Energy conservation;Patient/family education    OT Goals(Current goals can be found in the care plan section) Acute Rehab OT Goals Patient Stated Goal: none stated OT Goal Formulation: With patient Time For Goal Achievement: 02/04/16 Potential to Achieve Goals: Good ADL Goals Pt Will Perform Grooming: with modified independence;standing Pt Will Perform Lower Body Bathing: with modified independence;sit to/from stand Pt Will Perform Lower Body Dressing: with modified independence;sit to/from stand Pt Will Transfer to Toilet: with modified independence;ambulating;regular height toilet Pt Will Perform Toileting - Clothing Manipulation and hygiene: with modified independence;sit to/from stand Additional ADL Goal #1: Pt will generalize energy conservation and breathing techniques in mobility and ADL independently.  OT Frequency: Min 2X/week   Barriers to D/C: Decreased caregiver support          Co-evaluation              End of Session Equipment Utilized During Treatment: Gait belt;Rolling walker;Oxygen  Activity Tolerance: Patient limited by fatigue Patient left: in chair;with call bell/phone within reach;with family/visitor present   Time: 1350-1410 OT Time Calculation (min): 20 min Charges:  OT General Charges $OT Visit: 1 Procedure OT Evaluation $OT Eval Moderate Complexity: 1 Procedure G-Codes:    Malka So 01/21/2016, 2:21 PM  [phone]

## 2016-01-21 NOTE — H&P (Signed)
Date: 01/21/2016               Patient Name:  Angela Franco MRN: [id number]  DOB: 1929/03/04 Age / Sex: 82 y.o., female   PCP: Marylynn Pearson, MD         Medical Service: Internal Medicine Teaching Service         Attending Physician: Dr. Ivor Costa, MD    First Contact: Dr. Lovena Le Pager: [phone]  Second Contact: Dr. Marvel Plan Pager: [phone]       After Hours (After 5p/  First Contact Pager: [phone]  weekends / holidays): Second Contact Pager: [phone]   Chief Complaint: Shortness of breath, cough, chest congestion, fevers  History of Present Illness: Patient is a 82 year old female with a past medical history of COPD, A. fib, hypertension, CHF, left bundle branch block, nonischemic cardiomyopathy, and hyperlipidemia presenting with a hospital with a complaint of shortness of breath, cough, chest congestion, and fevers for the past 3 days. States she has been having fevers with temp > 100F. As per patient, her symptoms are getting progressively worse. Reports having rhinorrhea, cough productive of yellow phlegm, and decreased by mouth intake. Denies having any recent sick contacts or travel. Denies having any chest pain. States she is compliant with her medications at home and currently uses 2 L of oxygen at all times, previously did not require oxygen all the time. Denies having any body aches, headaches, nausea, or vomiting. Patient was recently discharged from [facility] on 01/312017 after being treated for COPD exacerbation and influenza. She was discharged on a steroid taper and ipratropium-albuterol nebulizers. She lives at an assisted living facility and was seen by a physician there, chest x-ray was ordered but results pending. She was given antibiotics and steroids but her symptoms continued to worsen. Upon EMS arrival, patient was found to have diffuse rhonchi, O2 saturation 84% on 4 L oxygen. She was started on CPAP and given one tablet of sublingual nitroglycerin. Also  given Solu-Medrol and DuoNeb and O2 saturation improved to 97%.  Meds: Current Facility-Administered Medications  Medication Dose Route Frequency Provider Last Rate Last Dose  . apixaban (ELIQUIS) tablet 2.5 mg  2.5 mg Oral BID Ivor Costa, MD      . carvedilol (COREG) tablet 6.25 mg  6.25 mg Oral BID WC Ivor Costa, MD      . cefTAZidime (FORTAZ) 1 g in dextrose 5 % 50 mL IVPB  1 g Intravenous Daily Ivor Costa, MD      . ferrous sulfate tablet 325 mg  325 mg Oral Q breakfast Ivor Costa, MD      . furosemide (LASIX) tablet 80 mg  80 mg Oral Daily Ivor Costa, MD      . gabapentin (NEURONTIN) tablet 600 mg  600 mg Oral TID Ivor Costa, MD      . guaiFENesin-dextromethorphan (ROBITUSSIN DM) 100-10 MG/5ML syrup 5 mL  5 mL Oral Q4H Ejiroghene E Emokpae, MD      . ipratropium-albuterol (DUONEB) 0.5-2.5 (3) MG/3ML nebulizer solution 3 mL  3 mL Nebulization Q6H Ejiroghene E Emokpae, MD      . predniSONE (DELTASONE) tablet 40 mg  40 mg Oral Q breakfast Ejiroghene E Emokpae, MD      . simvastatin (ZOCOR) tablet 5 mg  5 mg Oral q1800 Ivor Costa, MD      . temazepam (RESTORIL) capsule 15 mg  15 mg Oral QHS Ivor Costa, MD   15 mg at 01/21/16 0310    Allergies:  Allergies as of 01/20/2016 - Review Complete 01/20/2016  Allergen Reaction Noted  . Penicillins Hives 06/28/2012   Past Medical History  Diagnosis Date  . COPD (chronic obstructive pulmonary disease) ([place])     Never smoked. Questionable diagnosis.  . Atrial fibrillation ([place])   . Osteopenia   . Insomnia   . Hyponatremia   . Hypertension   . UTI (lower urinary tract infection)   . Bursitis of hip   . Trigeminal neuralgia   . Altered mental status 08/24/2012  . Anemia, with significant drop in H/H 08/24/2012  . CHF (congestive heart failure) ([place])   . Heart disease   . Acute facial pain   . LBBB (left bundle branch block)   . Nonischemic cardiomyopathy ([place])   . Dyslipidemia   . Kidney failure     Stage 3   Past Surgical History  Procedure  Laterality Date  . Appendectomy    . Tonsillectomy and adenoidectomy    . Cardiac catheterization  07/04/2012    normal coronaries, EF 30-35%, elevated R & L heart pressures, reduced CO, mild pulm HTN (Dr. K. Mali Hilty)  . Transthoracic echocardiogram  2013    mild conc LVH, RV mildly dilated; LA severely dilated; RA mod dilated; mild-mod mitral annular calcif, calcif of anterior & posterior MV leaflet, mod MR; mod-severe TR with RSVP 30-44mmHg; mild calcif of AV leaflets and mod aortic regurg; mild pulm valve regurg; pericardial effusion (right)  . Nm myocar perf wall motion  2011    lexiscan myoview - normal LV systolic function w/normal wall motion, no evidence of scar/ischemia  . Left and right heart catheterization with coronary angiogram N/A 07/04/2012    Procedure: LEFT AND RIGHT HEART CATHETERIZATION WITH CORONARY ANGIOGRAM;  Surgeon: Pixie Casino, MD;  Location: [facility] CATH LAB;  Service: Cardiovascular;  Laterality: N/A;   Family History  Problem Relation Age of Onset  . Stroke Father   . Heart attack Father   . Heart attack Brother   . Heart attack Brother   . Alzheimer's disease Mother    Social History   Social History  . Marital Status: Widowed    Spouse Name: N/A  . Number of Children: 3  . Years of Education: 12   Occupational History  . Not on file.   Social History Main Topics  . Smoking status: Never Smoker   . Smokeless tobacco: Never Used  . Alcohol Use: No  . Drug Use: No  . Sexual Activity: Not on file   Other Topics Concern  . Not on file   Social History Narrative   Patient is widowed.    Patient has 3 children.    Patient is retired.     Review of Systems: Review of Systems  Constitutional: Positive for fever and chills.  HENT: Positive for congestion. Negative for ear pain and sore throat.   Eyes: Negative for pain and discharge.  Respiratory: Positive for cough, sputum production, shortness of breath and wheezing.   Cardiovascular:  Negative for chest pain, orthopnea and leg swelling.  Gastrointestinal: Negative for nausea, vomiting, abdominal pain and diarrhea.  Genitourinary: Negative for dysuria, urgency and frequency.  Musculoskeletal: Negative for myalgias and joint pain.  Skin: Negative for itching and rash.  Neurological: Negative for dizziness, sensory change, focal weakness and headaches.    Physical Exam: Blood pressure 135/95, pulse 97, temperature 97.7 F (36.5 C), temperature source Oral, resp. rate 24, height 5\' 3"  (1.6 m), weight 59.9 kg (132 lb  0.9 oz), SpO2 91 %. Physical Exam  Constitutional: She is oriented to person, place, and time. She appears well-developed and well-nourished.  HENT:  Head: Normocephalic and atraumatic.  Postnasal drip secretions noted in the posterior oropharynx.  Eyes: EOM are normal. Pupils are equal, round, and reactive to light.  Neck: Neck supple. No tracheal deviation present.  Cardiovascular: Normal rate and intact distal pulses.  Exam reveals no gallop and no friction rub.   No murmur heard. Irregular rhythm  Pulmonary/Chest: She is in respiratory distress. She has no rales.  On supplemental oxygen Unable to speak in full sentences Diffuse rhonchi and wheezing appreciated  Abdominal: Soft. Bowel sounds are normal. She exhibits no distension. There is no tenderness.  Musculoskeletal: She exhibits edema.  Trace pitting edema of bilateral lower extremities  Neurological: She is alert and oriented to person, place, and time.  Strength and sensation grossly intact in bilateral upper and lower extremities.  Skin: Skin is warm and dry. No rash noted. She is not diaphoretic. No erythema.  Psychiatric: She has a normal mood and affect. Her behavior is normal.    Lab results: Basic Metabolic Panel:  Recent Labs  01/20/16 2240  NA 132*  K 3.9  CL 91*  CO2 29  GLUCOSE 142*  BUN 31*  CREATININE 1.55*  CALCIUM 9.2   Liver Function Tests:  Recent Labs   01/20/16 2240  AST 29  ALT 17  ALKPHOS 78  BILITOT 0.6  PROT 8.4*  ALBUMIN 3.2*   CBC:  Recent Labs  01/20/16 2240  WBC 6.1  NEUTROABS 4.9  HGB 10.3*  HCT 32.4*  MCV 96.1  PLT 172   Coagulation:  Recent Labs  01/21/16 0116  LABPROT 19.4*  INR 1.63*   Urine Drug Screen: Drugs of Abuse     Component Value Date/Time   LABOPIA POSITIVE* 08/24/2012 1119   COCAINSCRNUR NONE DETECTED 08/24/2012 1119   LABBENZ NONE DETECTED 08/24/2012 1119   AMPHETMU NONE DETECTED 08/24/2012 1119   THCU NONE DETECTED 08/24/2012 1119   LABBARB NONE DETECTED 08/24/2012 1119    Imaging results:  Dg Chest 2 View  01/20/2016  CLINICAL DATA:  Cough, congestion, shortness of breath, low-grade fever possible pneumonia EXAM: CHEST  2 VIEW COMPARISON:  01/03/2016 FINDINGS: Cardiomegaly again noted. No pulmonary edema. Mild perihilar bronchitic changes. There is streaky right infrahilar infiltrate or bronchitic changes. Thoracic spine osteopenia. Mild degenerative changes lower thoracic and lumbar spine. IMPRESSION: Mild perihilar bronchitic changes. Streaky infiltrate or bronchitic changes right infrahilar region. No pulmonary edema. Electronically Signed   By: Lahoma Crocker M.D.   On: 01/20/2016 16:20   Dg Chest Portable 1 View  01/20/2016  CLINICAL DATA:  82 year old female with cough and shortness of breath EXAM: PORTABLE CHEST 1 VIEW COMPARISON:  Radiograph dated 01/20/2016 FINDINGS: Single-view of the chest demonstrates emphysematous changes of the lungs. There are mild bronchiectatic changes. No focal consolidation, pleural effusion, or pneumothorax. Stable cardiomegaly. No acute osseous pathology. IMPRESSION: No active disease. Electronically Signed   By: Anner Crete M.D.   On: 01/20/2016 22:29    Other results: EKG: Atrial fibrillation; similar to prior tracings.  Assessment & Plan by Problem: Principal Problem:   Acute on chronic respiratory failure with hypoxia (HCC) Active Problems:    Systolic and diastolic CHF, acute on chronic,    Normocytic anemia   Atrial fibrillation, chronic (HCC)   Moderate to severe pulmonary hypertension, PA pressure in 70s June 2013   Non-ischemic cardiomyopathy EF improved  from 30-35% to 40-45% by Echo in 07/2012   LBBB (left bundle branch block)   HTN (hypertension)   Chronic renal impairment, stage 3 (moderate)   COPD exacerbation (HCC)  Acute on chronic respiratory failure with hypoxia Likely secondary to COPD exacerbation as patient reports having worsening shortness of breath, cough productive of yellow sputum, and fevers for the past 3 days. She was recently discharged on 01/04/2016 after being treated for COPD exacerbation and influenza. Physical exam remarkable for diffuse rhonchi and wheezing. Not likely CHF exacerbation as patient did not appear fluid overloaded on lung exam and BNP only modestly elevated at 234.3. She was given a 500cc bolus of IVF and vancomycin 1g in the ED.  -Admitted to telemetry -Continue supplemental O2 -Ceftazidime IV 1 g daily  -Prednisone 40 mg daily  -Robitussin-DM -Atrovent nebulizer 4 times a day -Solumedrol 60 mg TID -Follow-up influenza PCR and respiratory viral panel. -Blood cultures pending  -Doppler precautions -Follow-up labs: Lactic acid -Will need outpatient PFTs -PT/OT eval and treat   Combined systolic and diastolic heart failure Echo from April 2016 showing ejection fraction of 40-45%, mild aortic regurgitation, mild mitral regurgitation, moderate dilation of the left atrium moderate dilation the right ventricle. Right atrium was severely dilated and there was moderate tricuspid regurgitation. Pulmonary artery pressure elevated at 45 mmHg. -Continue home med Lasix 80 mg daily  -Continue Coreg 6.25 mg BID  Chronic atrial fibrillation -Continue home medication Eliquis 2.5 mg daily  -Continue Coreg 6.25 mg BID  Hypertension -Continue Coreg 6.25 mg twice a day for now. Consider switching  to Metoprolol to prevent beta-1 receptor blockade and subsequent worsening of respiratory distress.  Hyperlipidemia -continue home med Zocor 5 mg daily  Stage III CKD Serum creatinine 1.5 on admission with a baseline around 1.5. As per Epic records patient's previous creatinine has been worsening since 2013. -Continue to monitor renal function  Iron deficiency anemia  Hgb 10.3, normal MCV. Iron low (38) and ferritin (26) in 2013.  -Continue Ferrous sulfate 325 mg daily  -Patient will need iron panel repeated as outpatient   Trigeminal neuralgia -Continue home medication Trileptal for now. Consider switching to another medication because patient is chronically hyponatremic.   Hyponatremia Na 132 on admission and records show patient is chronically hyponatremic. Likely in the trileptal use for trigeminal neuralgia. -Consider switching patient to another medication -Monitor daily BMP   Insomnia  -Continue Restoril 15 mg QHS  Diet: Heart healthy  DVT ppx: Eliquis   Code- DNR  Dispo: Disposition is deferred at this time, awaiting improvement of current medical problems. Anticipated discharge in approximately 2-3 day(s).   The patient does have a current PCP Marylynn Pearson, MD) and does need an [facility] hospital follow-up appointment after discharge.  The patient does not have transportation limitations that hinder transportation to clinic appointments.  Signed: Shela Leff, MD 01/21/2016, 6:45 AM

## 2016-01-21 NOTE — Progress Notes (Signed)
Internal Medicine Attending  Date: 01/21/2016  Patient name: Angela Franco Medical record number: [id number] Date of birth: 1929-04-13 Age: 82 y.o. Gender: female  I saw and evaluated the patient. I reviewed the resident's note by Dr. Lovena Le and I agree with the resident's findings and plans as documented in his progress note.   Please see my H&P dated 01/21/2016 and attached to Dr. Elon Jester H&P dated 01/21/2016 for the specifics of my evaluation, assessment, and plan from earlier today.

## 2016-01-22 DIAGNOSIS — I272 Other secondary pulmonary hypertension: Secondary | ICD-10-CM

## 2016-01-22 DIAGNOSIS — I5022 Chronic systolic (congestive) heart failure: Secondary | ICD-10-CM | POA: Insufficient documentation

## 2016-01-22 LAB — BLOOD GAS, ARTERIAL
ACID-BASE EXCESS: 9.9 mmol/L — AB (ref 0.0–2.0)
BICARBONATE: 35.1 meq/L — AB (ref 20.0–24.0)
DRAWN BY: 27022
O2 CONTENT: 3 L/min
O2 Saturation: 95.6 %
PATIENT TEMPERATURE: 98.6
PH ART: 7.387 (ref 7.350–7.450)
TCO2: 36.9 mmol/L (ref 0–100)
pCO2 arterial: 59.8 mmHg (ref 35.0–45.0)
pO2, Arterial: 79.3 mmHg — ABNORMAL LOW (ref 80.0–100.0)

## 2016-01-22 MED ORDER — GUAIFENESIN ER 600 MG PO TB12
600.0000 mg | ORAL_TABLET | Freq: Two times a day (BID) | ORAL | Status: DC
  Administered 2016-01-22 – 2016-01-26 (×9): 600 mg via ORAL
  Filled 2016-01-22 (×9): qty 1

## 2016-01-22 MED ORDER — FUROSEMIDE 10 MG/ML IJ SOLN
80.0000 mg | Freq: Once | INTRAMUSCULAR | Status: AC
  Administered 2016-01-22: 80 mg via INTRAVENOUS
  Filled 2016-01-22: qty 8

## 2016-01-22 MED ORDER — TEMAZEPAM 7.5 MG PO CAPS: 7.5000 mg | ORAL_CAPSULE | Freq: Every day | ORAL | Status: DC

## 2016-01-22 MED ORDER — LISINOPRIL 5 MG PO TABS
5.0000 mg | ORAL_TABLET | Freq: Every day | ORAL | Status: DC
  Administered 2016-01-22 – 2016-01-24 (×3): 5 mg via ORAL
  Filled 2016-01-22 (×3): qty 1

## 2016-01-22 NOTE — Progress Notes (Addendum)
Patient very sleepy "states she is very sleepy" she will arouse then fall right back asleep, BP 90/40 sats 94 on 3L MD on call Dr Marvel Plan made aware of patient status, orders received Will monitor patient. Carolee Channell, Bettina Gavia RN

## 2016-01-22 NOTE — Progress Notes (Signed)
Order received to DC telemetry, monitor removed and CCMD made aware. Kwana Ringel, Bettina Gavia RN

## 2016-01-22 NOTE — Progress Notes (Signed)
Internal Medicine Attending  Date: 01/22/2016  Patient name: Angela Franco Medical record number: [id number] Date of birth: Jun 20, 1929 Age: 82 y.o. Gender: female  I saw and evaluated the patient. I reviewed the resident's note by Dr. Marvel Plan and I agree with the resident's findings and plans as documented in Angela progress note.  By late morning Angela Franco was not feeling well. She continued to have significant dyspnea on exertion. Dr. Marvel Plan and I were fortunate enough to be able to speak at length with Angela Franco. She gave [place] further information on Angela history. Apparently, Angela Franco has never smoked nor worked outside of the home where she would have exposures to various chemicals or toxins. She has never undergone pulmonary function test as she has never been interested in doing so. That being said, she was empirically started on Spiriva by another physician. Angela Franco feels she has been labeled as a COPD patient because of the [place]. Given this information, and the lack of risk factors as she did not even have a partner who smoked, obstructive lung disease would be more unusual unless it was bronchospastic in nature. She has known heart disease with a left ventricular ejection fraction of 40% and tricuspid regurg suggesting pulmonary hypertension. It may be the case that Angela symptoms are related to Angela heart failure and concomitant pulmonary hypertension rather than some chronic obstructive pulmonary process. Physical exam reveals by basilar inspiratory squeaks and rales with good air movement. We will give Angela a dose of IV Lasix and assess Angela response. If she is improved with this therapy that we may convert our COPD management into more aggressive heart failure management. Regardless, Angela pulmonary hypertension will require therapy for the underlying process, whether it's heart failure or obstructive lung disease. If we can get Angela to feel better she can have formal outpatient pulmonary  function tests to better evaluate Angela underlying pulmonary function given this new information and lack of risk factors for a chronic obstructive pulmonary process.

## 2016-01-22 NOTE — Progress Notes (Signed)
Patient daughter in the room with questions. Paged on call physician and they stated discuss with colleagues and try to come and speak with patients daughter. Will monitor patient. Aubreyana Saltz, Bettina Gavia RN

## 2016-01-22 NOTE — Progress Notes (Addendum)
Subjective:  Patient was seen and examined this morning. She states her cough and shortness of breath are improving. She has occasional wheezing. She feels close to her baseline, but not quite back to normal. She denies any fever, chills, chest pain or nausea.   Objective: Vital signs in last 24 hours: Filed Vitals:   01/21/16 2047 01/22/16 0341 01/22/16 0431 01/22/16 0731  BP: 120/61  138/73   Pulse:      Temp: 98.3 F (36.8 C)  98.2 F (36.8 C)   TempSrc: Oral  Oral   Resp:   20   Height:      Weight:  133 lb 3.2 oz (60.419 kg)    SpO2: 91%  95% 94%   Weight change: 1 lb 2.3 oz (0.519 kg)  Intake/Output Summary (Last 24 hours) at 01/22/16 1204 Last data filed at 01/22/16 1037  Gross per 24 hour  Intake    340 ml  Output    450 ml  Net   -110 ml   General: Vital signs reviewed.  Patient is elderly, in no acute distress and cooperative with exam.  Cardiovascular: Irregularly irregular Pulmonary/Chest: Inspiratory crackles with squeak and mild expiratory wheezes, good air movement. Non-labored breathing, speaking in full sentences on 3 L O2.  Abdominal: Soft, non-tender, non-distended, BS + Extremities: Trace pitting edema in lower extremities bilaterally  Lab Results: Basic Metabolic Panel:  Recent Labs Lab 01/20/16 2240  NA 132*  K 3.9  CL 91*  CO2 29  GLUCOSE 142*  BUN 31*  CREATININE 1.55*  CALCIUM 9.2   Liver Function Tests:  Recent Labs Lab 01/20/16 2240  AST 29  ALT 17  ALKPHOS 78  BILITOT 0.6  PROT 8.4*  ALBUMIN 3.2*   CBC:  Recent Labs Lab 01/20/16 2240  WBC 6.1  NEUTROABS 4.9  HGB 10.3*  HCT 32.4*  MCV 96.1  PLT 172   Coagulation:  Recent Labs Lab 01/21/16 0116  LABPROT 19.4*  INR 1.63*   Urine Drug Screen: Drugs of Abuse     Component Value Date/Time   LABOPIA POSITIVE* 08/24/2012 1119   COCAINSCRNUR NONE DETECTED 08/24/2012 1119   LABBENZ NONE DETECTED 08/24/2012 1119   AMPHETMU NONE DETECTED 08/24/2012 1119   THCU NONE DETECTED 08/24/2012 1119   LABBARB NONE DETECTED 08/24/2012 1119    Urinalysis:  Recent Labs Lab 01/21/16 0631  COLORURINE YELLOW  LABSPEC 1.015  PHURINE 5.5  GLUCOSEU NEGATIVE  HGBUR TRACE*  BILIRUBINUR NEGATIVE  KETONESUR NEGATIVE  PROTEINUR NEGATIVE  NITRITE NEGATIVE  LEUKOCYTESUR NEGATIVE   Micro Results: Recent Results (from the past 240 hour(s))  Blood culture (routine x 2)     Status: None (Preliminary result)   Collection Time: 01/20/16 10:25 PM  Result Value Ref Range Status   Specimen Description BLOOD RIGHT ANTECUBITAL  Final   Special Requests BOTTLES DRAWN AEROBIC AND ANAEROBIC 5CC  Final   Culture NO GROWTH < 24 HOURS  Final   Report Status PENDING  Incomplete  Blood culture (routine x 2)     Status: None (Preliminary result)   Collection Time: 01/20/16 10:30 PM  Result Value Ref Range Status   Specimen Description BLOOD RIGHT HAND  Final   Special Requests BOTTLES DRAWN AEROBIC AND ANAEROBIC 5CC  Final   Culture NO GROWTH < 24 HOURS  Final   Report Status PENDING  Incomplete   Studies/Results: Dg Chest 2 View  01/20/2016  CLINICAL DATA:  Cough, congestion, shortness of breath, low-grade fever possible pneumonia  EXAM: CHEST  2 VIEW COMPARISON:  01/03/2016 FINDINGS: Cardiomegaly again noted. No pulmonary edema. Mild perihilar bronchitic changes. There is streaky right infrahilar infiltrate or bronchitic changes. Thoracic spine osteopenia. Mild degenerative changes lower thoracic and lumbar spine. IMPRESSION: Mild perihilar bronchitic changes. Streaky infiltrate or bronchitic changes right infrahilar region. No pulmonary edema. Electronically Signed   By: Lahoma Crocker M.D.   On: 01/20/2016 16:20   Dg Chest Portable 1 View  01/20/2016  CLINICAL DATA:  82 year old female with cough and shortness of breath EXAM: PORTABLE CHEST 1 VIEW COMPARISON:  Radiograph dated 01/20/2016 FINDINGS: Single-view of the chest demonstrates emphysematous changes of the lungs.  There are mild bronchiectatic changes. No focal consolidation, pleural effusion, or pneumothorax. Stable cardiomegaly. No acute osseous pathology. IMPRESSION: No active disease. Electronically Signed   By: Anner Crete M.D.   On: 01/20/2016 22:29   Medications:  I have reviewed the patient's current medications. Prior to Admission:  Prescriptions prior to admission  Medication Sig Dispense Refill Last Dose  . apixaban (ELIQUIS) 2.5 MG TABS tablet Take 1 tablet (2.5 mg total) by mouth 2 (two) times daily. 60 tablet 11 01/20/2016 at Unknown time  . calcium-vitamin D (OSCAL WITH D) 500-200 MG-UNIT tablet Take 1 tablet by mouth daily with breakfast.   01/20/2016 at Unknown time  . carvedilol (COREG) 6.25 MG tablet Take 1 tablet (6.25 mg total) by mouth 2 (two) times daily with a meal. 60 tablet 3 01/20/2016 at 2000  . docusate sodium 100 MG CAPS Take 200 mg by mouth at bedtime as needed. (Patient taking differently: Take 100 mg by mouth at bedtime as needed (constipation). ) 60 capsule 0 Past Month at Unknown time  . ferrous sulfate 325 (65 FE) MG tablet Take 325 mg by mouth daily with breakfast.   01/20/2016 at Unknown time  . furosemide (LASIX) 80 MG tablet Take 1 tablet (80 mg total) by mouth daily. 30 tablet 11 01/20/2016 at Unknown time  . gabapentin (NEURONTIN) 600 MG tablet Take 1 tablet (600 mg total) by mouth 3 (three) times daily. Please dose at 8am, 2pm and 8pm 270 tablet 3 01/20/2016 at Unknown time  . guaifenesin (ROBITUSSIN) 100 MG/5ML syrup Take 400 mg by mouth 3 (three) times daily as needed for cough.   01/20/2016 at Unknown time  . ipratropium-albuterol (DUONEB) 0.5-2.5 (3) MG/3ML SOLN Take 3 mLs by nebulization every 6 (six) hours as needed. (Patient taking differently: Take 3 mLs by nebulization every 6 (six) hours as needed (shortness of breath). ) 360 mL 12 01/20/2016 at Unknown time  . levofloxacin (LEVAQUIN) 500 MG tablet Take 500 mg by mouth daily.   01/20/2016 at Unknown time  .  potassium chloride SA (K-DUR,KLOR-CON) 20 MEQ tablet Take 20 mEq by mouth daily.   01/20/2016 at Unknown time  . predniSONE (DELTASONE) 10 MG tablet Take 40 mg by mouth daily with breakfast.   01/20/2016 at Unknown time  . Probiotic Product (ALIGN PO) Take 1 tablet by mouth daily.   01/20/2016 at Unknown time  . simvastatin (ZOCOR) 10 MG tablet Take 0.5 tablets (5 mg total) by mouth daily at 6 PM. 45 tablet 3 01/20/2016 at Unknown time  . temazepam (RESTORIL) 15 MG capsule Take 15 mg by mouth at bedtime.    01/20/2016 at Unknown time   Scheduled Meds: . apixaban  2.5 mg Oral BID  . carvedilol  6.25 mg Oral BID WC  . cefTAZidime (FORTAZ)  IV  1 g Intravenous Daily  .  ferrous sulfate  325 mg Oral Q breakfast  . furosemide  80 mg Intravenous Once  . furosemide  80 mg Oral Daily  . gabapentin  600 mg Oral TID  . guaiFENesin  600 mg Oral BID  . guaiFENesin-dextromethorphan  5 mL Oral Q4H  . ipratropium-albuterol  3 mL Nebulization TID  . lisinopril  5 mg Oral Daily  . predniSONE  40 mg Oral Q breakfast  . simvastatin  5 mg Oral q1800  . temazepam  7.5 mg Oral QHS   Continuous Infusions:  PRN Meds:.acetaminophen, albuterol Assessment/Plan: Principal Problem:   COPD exacerbation (HCC) Active Problems:   Normocytic anemia   Atrial fibrillation, chronic (HCC)   Moderate to severe pulmonary hypertension, PA pressure in 70s June 2013   Non-ischemic cardiomyopathy EF improved from 30-35% to 40-45% by Echo in 07/2012   LBBB (left bundle branch block)   HTN (hypertension)   Chronic renal impairment, stage 3 (moderate)  Angela Franco is a 82 year old female with a past medical history of COPD, A. fib, hypertension, CHF, left bundle branch block, nonischemic cardiomyopathy, and hyperlipidemia presenting with a hospital with a complaint of shortness of breath, cough, chest congestion, and fevers for the past 3 days.  Combined CHF Exacerbation: Stable. Echo from April 2016 showing ejection fraction of  40-45%, mild aortic regurgitation, and mild mitral regurgitation with D shaped septum concerning for pulmonary HTN 2/2 CHF. No mention of Tricuspid Jet. We will increase diuresis today to see how patient responds. I am not sure why she is not on an ACEI given lack of allergy. I cannot find records of whether or not she has been tried on them in the past. -Lasix 80 mg daily  -One time dose of Lasix 80 mg IV  -Coreg 6.25 mg BID -Lisinopril 5 mg daily -Strict I/Os -Daily weights  COPD Exacerbation: Patient reports improvement in dyspnea and cough. After discussion with patient's daughter, she does not have an official diagnosis of COPD. She has only been on oxygen for 3 weeks. She is a never smoker and never worked outside of them home. She was placed on Spiriva by a previous doctor for unknown reasons. She is satting well on 3L, her normal oxygen at home is 2L. Patient was started on IV Ceftazidime given her recent hospitalization and failure of outpatient treatment of her COPD exacerbation with antibiotics. We will continue antibiotics, prednisone and duoneb treatments. Patient will be going to [facility] at discharge, likely tomorrow or Monday.  -D/C Telemetry -Ceftazidime IV 1 g daily 2/17>>2/18 -Transition to oral antibiotics tomorrow -Prednisone 40 mg daily (2/17 - 2/21) -Robitussin-DM -Duonebs TID -Blood cultures 2/16>> NGTD -Outpatient PFTs -Ambulate with pulse ox  Chronic Atrial Fibrillation: Rate controlled on home regimen. -Eliquis 2.5 mg daily  -Coreg 6.25 mg BID  Hypertension: Well controlled at 138/73 this morning.  -Coreg 6.25 mg BID -Lisinopril 5 mg daily  Insomnia: Patient is on Restoril 15 mg QHS at home, but states she sometimes only takes half. She admits to difficulty waking up this morning.  -Decrease Restoril to 7.5 mg QHS  DVT/PE ppx: Eliquis   Dispo: Disposition is deferred at this time, awaiting improvement of current medical problems.  Anticipated  discharge in approximately 1-2 day(s).   The patient does have a current PCP Marylynn Pearson, MD) and does not need an [facility] hospital follow-up appointment after discharge.  The patient does have transportation limitations that hinder transportation to clinic appointments.   LOS: 1 day  Angela Craver, Angela Franco PGY-2 Internal Medicine Resident Pager # [phone] 01/22/2016 12:04 PM

## 2016-01-22 NOTE — Progress Notes (Signed)
Patient declined ambulation at this time, However was able to place pulse ox on patient while ambulating to [facility] 02 sats at [facility] 91-94%. Patient with increasing shortness of breath with movement, sitting back in chair, patient states that she just "doesnt feel well today" Will monitor patient. Kore Madlock, Bettina Gavia RN

## 2016-01-22 NOTE — Progress Notes (Signed)
Patient ABG results called to MD, DR Posey Pronto, will monitor patient. Maor Meckel, Bettina Gavia RN

## 2016-01-22 NOTE — NC FL2 (Signed)
MEDICAID FL2 LEVEL OF CARE SCREENING TOOL     IDENTIFICATION  Patient Name: Angela Franco Birthdate: 1929-08-18 Sex: female Admission Date (Current Location): 01/20/2016  [facility] and [place] Number:  Herbalist and Address:  [facility] [address], [place], [place] [address]      Provider Number: [id number]  Attending Physician Name and Address:  Oval Linsey, MD  Relative Name and Phone Number:       Current Level of Care: Hospital Recommended Level of Care: [place] Prior Approval Number:    Date Approved/Denied:   PASRR Number:    Discharge Plan: SNF    Current Diagnoses: Patient Active Problem List   Diagnosis Date Noted  . COPD exacerbation ([place]) 01/21/2016  . Acute bronchitis   . Influenza A 01/02/2016  . Shortness of breath 12/29/2015  . Chronic renal impairment, stage 3 (moderate) 11/10/2015  . Normal coronary arteries August 2013 08/26/2012  . COPD by CXR 08/26/2012  . Altered mental status 08/24/2012  . Anemia, Hgb 6.4 on adm 08/23/12, Coumadin discontinued  08/24/2012  . Acute urinary retention 07/07/2012  . Nausea 07/06/2012  . Abdominal pain 07/06/2012  . Elevated INR 06/30/2012  . Non-ischemic cardiomyopathy EF improved from 30-35% to 40-45% by Echo in 07/2012 06/30/2012  . LBBB (left bundle branch block) 06/30/2012  . Chronic anticoagulation 06/30/2012  . HTN (hypertension) 06/30/2012  . Systolic and diastolic CHF, acute on chronic,  06/29/2012  . Hypo-osmolality and hyponatremia 06/29/2012  .  Elevated Troponin, (0.46), in the setting of rapid AF and anemia in a pt with no significant CAD at cath August 2013 06/29/2012  . Normocytic anemia 06/29/2012  . Atrial fibrillation, chronic ([place]) 06/29/2012  . Trigeminal neuralgia 06/29/2012  . Moderate to severe pulmonary hypertension, PA pressure in 70s June 2013 06/29/2012    Orientation RESPIRATION BLADDER Height & Weight      Self, Time, Situation, Place  O2 (3) Continent Weight: 133 lb 3.2 oz (60.419 kg) Height:  5\' 3"  (160 cm)  BEHAVIORAL SYMPTOMS/MOOD NEUROLOGICAL BOWEL NUTRITION STATUS      Continent Diet (Heart Healthy)  AMBULATORY STATUS COMMUNICATION OF NEEDS Skin   Extensive Assist Verbally Normal                       Personal Care Assistance Level of Assistance  Bathing, Feeding, Dressing Bathing Assistance: Limited assistance Feeding assistance: Independent Dressing Assistance: Limited assistance     Functional Limitations Info  Sight, Hearing, Speech Sight Info: Adequate Hearing Info: Adequate Speech Info: Adequate    SPECIAL CARE FACTORS FREQUENCY  PT (By licensed PT), OT (By licensed OT)     PT Frequency: 3x OT Frequency: 3x            Contractures Contractures Info: Not present    Additional Factors Info  Code Status, Allergies, Psychotropic Code Status Info: DNR Code Status Allergies Info: penicillins Psychotropic Info: none         Current Medications (01/22/2016):  This is the current hospital active medication list Current Facility-Administered Medications  Medication Dose Route Frequency Provider Last Rate Last Dose  . acetaminophen (TYLENOL) tablet 650 mg  650 mg Oral Q6H PRN Iline Oven, MD      . albuterol (PROVENTIL) (2.5 MG/3ML) 0.083% nebulizer solution 2.5 mg  2.5 mg Nebulization Q6H PRN Oval Linsey, MD      . apixaban [facility]) tablet 2.5 mg  2.5 mg Oral BID  Ivor Costa, MD   2.5 mg at 01/22/16 1034  . carvedilol (COREG) tablet 6.25 mg  6.25 mg Oral BID WC Ivor Costa, MD   6.25 mg at 01/22/16 0857  . cefTAZidime (FORTAZ) 1 g in dextrose 5 % 50 mL IVPB  1 g Intravenous Daily Ivor Costa, MD   1 g at 01/22/16 1037  . ferrous sulfate tablet 325 mg  325 mg Oral Q breakfast Ivor Costa, MD   325 mg at 01/22/16 0857  . furosemide (LASIX) tablet 80 mg  80 mg Oral Daily Ivor Costa, MD   80 mg at 01/22/16 1034  . gabapentin (NEURONTIN) tablet 600 mg  600 mg  Oral TID Ivor Costa, MD   600 mg at 01/22/16 1302  . guaiFENesin (MUCINEX) 12 hr tablet 600 mg  600 mg Oral BID Alexa Sherral Hammers, MD   600 mg at 01/22/16 1302  . guaiFENesin-dextromethorphan (ROBITUSSIN DM) 100-10 MG/5ML syrup 5 mL  5 mL Oral Q4H Ejiroghene E Emokpae, MD   5 mL at 01/22/16 1302  . ipratropium-albuterol (DUONEB) 0.5-2.5 (3) MG/3ML nebulizer solution 3 mL  3 mL Nebulization TID Oval Linsey, MD   3 mL at 01/22/16 1413  . lisinopril (PRINIVIL,ZESTRIL) tablet 5 mg  5 mg Oral Daily Alexa Sherral Hammers, MD   5 mg at 01/22/16 1302  . predniSONE (DELTASONE) tablet 40 mg  40 mg Oral Q breakfast Ejiroghene Arlyce Dice, MD   40 mg at 01/22/16 0857  . simvastatin (ZOCOR) tablet 5 mg  5 mg Oral q1800 Ivor Costa, MD   5 mg at 01/21/16 1825  . temazepam (RESTORIL) capsule 7.5 mg  7.5 mg Oral QHS Alexa Sherral Hammers, MD         Discharge Medications: Please see discharge summary for a list of discharge medications.  Relevant Imaging Results:  Relevant Lab Results:   Additional Information SS #  [id number]  Lilly [place], LCSW

## 2016-01-23 DIAGNOSIS — I5023 Acute on chronic systolic (congestive) heart failure: Secondary | ICD-10-CM

## 2016-01-23 DIAGNOSIS — B974 Respiratory syncytial virus as the cause of diseases classified elsewhere: Secondary | ICD-10-CM

## 2016-01-23 DIAGNOSIS — E876 Hypokalemia: Secondary | ICD-10-CM

## 2016-01-23 DIAGNOSIS — B338 Other specified viral diseases: Secondary | ICD-10-CM

## 2016-01-23 DIAGNOSIS — J121 Respiratory syncytial virus pneumonia: Secondary | ICD-10-CM

## 2016-01-23 LAB — BASIC METABOLIC PANEL
Anion gap: 12 (ref 5–15)
BUN: 48 mg/dL — AB (ref 6–20)
CHLORIDE: 88 mmol/L — AB (ref 101–111)
CO2: 35 mmol/L — ABNORMAL HIGH (ref 22–32)
CREATININE: 1.56 mg/dL — AB (ref 0.44–1.00)
Calcium: 8.7 mg/dL — ABNORMAL LOW (ref 8.9–10.3)
GFR calc Af Amer: 34 mL/min — ABNORMAL LOW (ref >60)
GFR, EST NON AFRICAN AMERICAN: 29 mL/min — AB (ref >60)
Glucose, Bld: 104 mg/dL — ABNORMAL HIGH (ref 65–99)
Potassium: 3.3 mmol/L — ABNORMAL LOW (ref 3.5–5.1)
SODIUM: 135 mmol/L (ref 135–145)

## 2016-01-23 LAB — RESPIRATORY VIRUS PANEL
ADENOVIRUS: NEGATIVE
INFLUENZA A: NEGATIVE
Influenza B: NEGATIVE
Metapneumovirus: NEGATIVE
PARAINFLUENZA 1 A: NEGATIVE
PARAINFLUENZA 2 A: NEGATIVE
Parainfluenza 3: NEGATIVE
RESPIRATORY SYNCYTIAL VIRUS B: NEGATIVE
RHINOVIRUS: NEGATIVE
Respiratory Syncytial Virus A: POSITIVE — AB

## 2016-01-23 MED ORDER — POTASSIUM CHLORIDE CRYS ER 20 MEQ PO TBCR
40.0000 meq | EXTENDED_RELEASE_TABLET | Freq: Once | ORAL | Status: AC
  Administered 2016-01-23: 40 meq via ORAL
  Filled 2016-01-23: qty 2

## 2016-01-23 MED ORDER — IPRATROPIUM-ALBUTEROL 0.5-2.5 (3) MG/3ML IN SOLN
3.0000 mL | Freq: Four times a day (QID) | RESPIRATORY_TRACT | Status: DC
  Administered 2016-01-23 – 2016-01-25 (×7): 3 mL via RESPIRATORY_TRACT
  Filled 2016-01-23 (×8): qty 3

## 2016-01-23 MED ORDER — FUROSEMIDE 10 MG/ML IJ SOLN
80.0000 mg | Freq: Once | INTRAMUSCULAR | Status: AC
  Administered 2016-01-23: 80 mg via INTRAVENOUS
  Filled 2016-01-23: qty 8

## 2016-01-23 MED ORDER — BOOST / RESOURCE BREEZE PO LIQD
1.0000 | Freq: Three times a day (TID) | ORAL | Status: DC
  Administered 2016-01-23 – 2016-01-26 (×2): 1 via ORAL

## 2016-01-23 NOTE — Progress Notes (Signed)
Subjective: No overnight events. This morning patient reports that she is short of breath and has difficulty breathing. She does not feel any worse but does not feel like she has improved either. Objective: Vital signs in last 24 hours: Filed Vitals:   01/22/16 2210 01/23/16 0300 01/23/16 0400 01/23/16 0903  BP: 127/63 131/67  139/72  Pulse: 69 94  80  Temp: 97.8 F (36.6 C) 98 F (36.7 C)  98 F (36.7 C)  TempSrc: Oral Oral  Oral  Resp: 20 22  20   Height:      Weight:   60.1 kg (132 lb 7.9 oz)   SpO2: 98% 96%  95%   Weight change: -0.319 kg (-11.3 oz)  Intake/Output Summary (Last 24 hours) at 01/23/16 1045 Last data filed at 01/23/16 0843  Gross per 24 hour  Intake    480 ml  Output    600 ml  Net   -120 ml   Physical Exam Physical Exam  Cardiovascular:  Irregular heartbeat noted.  Pulmonary/Chest: She has wheezes.  Expiratory wheezes noted at lung bases bilaterally. Slightly increased work of breathing  Musculoskeletal: She exhibits no edema.  Skin: Skin is warm and dry.     Lab Results: CBC    Component Value Date/Time   WBC 6.1 01/20/2016 2240   RBC 3.37* 01/20/2016 2240   HGB 10.3* 01/20/2016 2240   HCT 32.4* 01/20/2016 2240   PLT 172 01/20/2016 2240   MCV 96.1 01/20/2016 2240   MCH 30.6 01/20/2016 2240   MCHC 31.8 01/20/2016 2240   RDW 14.9 01/20/2016 2240   LYMPHSABS 1.0 01/20/2016 2240   MONOABS 0.2 01/20/2016 2240   EOSABS 0.0 01/20/2016 2240   BASOSABS 0.0 01/20/2016 2240    BMET    Component Value Date/Time   NA 135 01/23/2016 0300   NA 133* 10/25/2015 1637   K 3.3* 01/23/2016 0300   CL 88* 01/23/2016 0300   CO2 35* 01/23/2016 0300   GLUCOSE 104* 01/23/2016 0300   GLUCOSE 94 10/25/2015 1637   BUN 48* 01/23/2016 0300   BUN 27 10/25/2015 1637   CREATININE 1.56* 01/23/2016 0300   CREATININE 1.69* 11/08/2015 0900   CALCIUM 8.7* 01/23/2016 0300   GFRNONAA 29* 01/23/2016 0300   GFRAA 34* 01/23/2016 0300     Micro Results: Recent  Results (from the past 240 hour(s))  Blood culture (routine x 2)     Status: None (Preliminary result)   Collection Time: 01/20/16 10:25 PM  Result Value Ref Range Status   Specimen Description BLOOD RIGHT ANTECUBITAL  Final   Special Requests BOTTLES DRAWN AEROBIC AND ANAEROBIC 5CC  Final   Culture NO GROWTH 2 DAYS  Final   Report Status PENDING  Incomplete  Blood culture (routine x 2)     Status: None (Preliminary result)   Collection Time: 01/20/16 10:30 PM  Result Value Ref Range Status   Specimen Description BLOOD RIGHT HAND  Final   Special Requests BOTTLES DRAWN AEROBIC AND ANAEROBIC 5CC  Final   Culture NO GROWTH 2 DAYS  Final   Report Status PENDING  Incomplete  Respiratory virus panel     Status: Abnormal   Collection Time: 01/21/16  5:56 AM  Result Value Ref Range Status   Respiratory Syncytial Virus A Positive (A) Negative Final   Respiratory Syncytial Virus B Negative Negative Final   Influenza A Negative Negative Final   Influenza B Negative Negative Final   Parainfluenza 1 Negative Negative Final   Parainfluenza 2  Negative Negative Final   Parainfluenza 3 Negative Negative Final   Metapneumovirus Negative Negative Final   Rhinovirus Negative Negative Final   Adenovirus Negative Negative Final    Comment: (NOTE) Performed At: [facility] [place], [place] [id number] Lindon Romp MD [id number]    Studies/Results: No results found.  Scheduled Meds: . apixaban  2.5 mg Oral BID  . carvedilol  6.25 mg Oral BID WC  . cefTAZidime (FORTAZ)  IV  1 g Intravenous Daily  . ferrous sulfate  325 mg Oral Q breakfast  . furosemide  80 mg Intravenous Once  . furosemide  80 mg Oral Daily  . gabapentin  600 mg Oral TID  . guaiFENesin  600 mg Oral BID  . guaiFENesin-dextromethorphan  5 mL Oral Q4H  . ipratropium-albuterol  3 mL Nebulization QID  . lisinopril  5 mg Oral Daily  . predniSONE  40 mg Oral Q breakfast  . simvastatin  5 mg Oral q1800    Continuous Infusions:  PRN Meds:.acetaminophen, albuterol Assessment/Plan: Principal Problem:   COPD exacerbation (HCC) Active Problems:   Normocytic anemia   Atrial fibrillation, chronic (HCC)   Moderate to severe pulmonary hypertension, PA pressure in 70s June 2013   Non-ischemic cardiomyopathy EF improved from 30-35% to 40-45% by Echo in 07/2012   LBBB (left bundle branch block)   HTN (hypertension)   Chronic renal impairment, stage 3 (moderate)   Chronic systolic heart failure ([place])   Angela Franco is an 82 year old female with a past medical history of possible COPD, afib, HTN, CHF, Left bundle branch block, nonischemic cardiomyopathy, and hyperlipidemia presenting with SOB, cough, chest congestion, and fevers of the past 3 days. This is hospital day 2 with no notable clinical improvement in the past 24 hours. Her labs came back notable for RSV Type A infection. Due to her age, and lack of severity an antiviral inhaled agent like ribavirin is not recommended. Should she become immunocompromised, immunotherapy and antiviral agents would be indicated. At this time supportive management is the mainstay for an RSV infection, and though bronchodilators + steroids play no role in resolution of an RSV infection we are continuing these treatment as we are not entirely clear on what underlying lung pathology she has going on, other than possible COPD for which these medications are warranted.   Possible COPD exacerbation 2/2 to RSV infection: The respiratory viral panel returned positive for RSV type A which could be the trigger for her presentation. It is still difficult to point toward an etiology for her underlying COPD given her lack of risk factors(no hx of smoking, known environmental/ allergen exposure.  -Provide supportive care for RSV infection. -Prednisone 40mg  until 01/25/2016 -Robitussin and Mucinex for cough suppression -Duonebs 4x daily now -Outpatient PFT's for further  clarification   CHF Exacerbation: Pt on strict I/O but has not really had much output(700cc) in past 24 hours noted per nursing. We will adminster another dose of Lasix 80mg  and she how she responds. -Lasix 80mg  IV dose one time this morning -Coreg 6.25 mg BID -Lisinopril 5 mg daily -Strict I/Os -Daily weights  Hypokalemia: Today K+ low at 3.3, no EKG changes noted on most recent test. -30mEq adminstered this morning with anticipated K+ on next metabolic panel to be around 3.7   Chronic Atrial fibrillation: Rate controlled on home regimen -Eliquis 2.5mg  daily -Coreg 6.25mg  BID  HTN: Well controlled at 131/67 most recently this morning. -Coreg 6.25 mg BID -Lisinopril 5mg  daily  Insomnia: Patient is on Restoril(Temazepam) which is an intermediate acting benzodiazepine being given for her insomnia.  This medication has been discontinued at this time and I recommend not restarting this medication per the [place] Criteria it is not recommended for insomnia treatment in patients over the age of 63. If she still has issues with sleep a non-benzo hypnotic would be a better choice, Sonata(Zaleplon) 5mg  starting dose given 1 hr prior to the time the patient wants to sleep would be best since it has a rapid onset of action and would be indicated for sleep initiation.  DVT/PE ppx: Eliquis   Dispo: Disposition is deferred at this time, awaiting improvement of current medical problems. Anticipated discharge in approximately 1-2 day(s).   The patient does have a current PCP Marylynn Pearson, MD) and does not need an [facility] hospital follow-up appointment after discharge.  The patient does have transportation limitations that hinder transportation to clinic appointments.   This is a Careers information officer Note.  The care of the patient was discussed  and the assessment and plan formulated with their assistance.  Please see their attached note for official documentation of the daily encounter.   LOS: 2 days    Delena Serve., Med Student 01/23/2016, 10:45 AM

## 2016-01-23 NOTE — Progress Notes (Signed)
Subjective:  Patient was seen and examined this morning. She continues to feel short of breath, fatigued and with a productive cough. She was not able to sleep well last night due to her cough.   Objective: Vital signs in last 24 hours: Filed Vitals:   01/22/16 2210 01/23/16 0300 01/23/16 0400 01/23/16 0903  BP: 127/63 131/67  139/72  Pulse: 69 94  80  Temp: 97.8 F (36.6 C) 98 F (36.7 C)  98 F (36.7 C)  TempSrc: Oral Oral  Oral  Resp: 20 22  20   Height:      Weight:   132 lb 7.9 oz (60.1 kg)   SpO2: 98% 96%  95%   Weight change: -11.3 oz (-0.319 kg)  Intake/Output Summary (Last 24 hours) at 01/23/16 1156 Last data filed at 01/23/16 0843  Gross per 24 hour  Intake    480 ml  Output    600 ml  Net   -120 ml   General: Vital signs reviewed. Patient is elderly, ill appearing and cooperative with exam.  Cardiovascular: Irregularly irregular Pulmonary/Chest: Diffuse inspiratory crackles with squeak, diffuse expiratory wheezes and scattered rhonchi.  Extremities: Trace pitting edema in lower extremities bilaterally Neuro: Fatigued.  Lab Results: Basic Metabolic Panel:  Recent Labs Lab 01/20/16 2240 01/23/16 0300  NA 132* 135  K 3.9 3.3*  CL 91* 88*  CO2 29 35*  GLUCOSE 142* 104*  BUN 31* 48*  CREATININE 1.55* 1.56*  CALCIUM 9.2 8.7*   Liver Function Tests:  Recent Labs Lab 01/20/16 2240  AST 29  ALT 17  ALKPHOS 78  BILITOT 0.6  PROT 8.4*  ALBUMIN 3.2*   CBC:  Recent Labs Lab 01/20/16 2240  WBC 6.1  NEUTROABS 4.9  HGB 10.3*  HCT 32.4*  MCV 96.1  PLT 172   Coagulation:  Recent Labs Lab 01/21/16 0116  LABPROT 19.4*  INR 1.63*   Urine Drug Screen: Drugs of Abuse     Component Value Date/Time   LABOPIA POSITIVE* 08/24/2012 1119   COCAINSCRNUR NONE DETECTED 08/24/2012 1119   LABBENZ NONE DETECTED 08/24/2012 1119   AMPHETMU NONE DETECTED 08/24/2012 1119   THCU NONE DETECTED 08/24/2012 1119   LABBARB NONE DETECTED 08/24/2012 1119    Urinalysis:  Recent Labs Lab 01/21/16 0631  COLORURINE YELLOW  LABSPEC 1.015  PHURINE 5.5  GLUCOSEU NEGATIVE  HGBUR TRACE*  BILIRUBINUR NEGATIVE  KETONESUR NEGATIVE  PROTEINUR NEGATIVE  NITRITE NEGATIVE  LEUKOCYTESUR NEGATIVE   Micro Results: Recent Results (from the past 240 hour(s))  Blood culture (routine x 2)     Status: None (Preliminary result)   Collection Time: 01/20/16 10:25 PM  Result Value Ref Range Status   Specimen Description BLOOD RIGHT ANTECUBITAL  Final   Special Requests BOTTLES DRAWN AEROBIC AND ANAEROBIC 5CC  Final   Culture NO GROWTH 2 DAYS  Final   Report Status PENDING  Incomplete  Blood culture (routine x 2)     Status: None (Preliminary result)   Collection Time: 01/20/16 10:30 PM  Result Value Ref Range Status   Specimen Description BLOOD RIGHT HAND  Final   Special Requests BOTTLES DRAWN AEROBIC AND ANAEROBIC 5CC  Final   Culture NO GROWTH 2 DAYS  Final   Report Status PENDING  Incomplete  Respiratory virus panel     Status: Abnormal   Collection Time: 01/21/16  5:56 AM  Result Value Ref Range Status   Respiratory Syncytial Virus A Positive (A) Negative Final   Respiratory Syncytial Virus  B Negative Negative Final   Influenza A Negative Negative Final   Influenza B Negative Negative Final   Parainfluenza 1 Negative Negative Final   Parainfluenza 2 Negative Negative Final   Parainfluenza 3 Negative Negative Final   Metapneumovirus Negative Negative Final   Rhinovirus Negative Negative Final   Adenovirus Negative Negative Final    Comment: (NOTE) Performed At: [facility] [address] [place], [place] [id number] Lindon Romp MD [id number]    Medications:  I have reviewed the patient's current medications. Prior to Admission:  Prescriptions prior to admission  Medication Sig Dispense Refill Last Dose  . apixaban (ELIQUIS) 2.5 MG TABS tablet Take 1 tablet (2.5 mg total) by mouth 2 (two) times daily. 60 tablet 11  01/20/2016 at Unknown time  . calcium-vitamin D (OSCAL WITH D) 500-200 MG-UNIT tablet Take 1 tablet by mouth daily with breakfast.   01/20/2016 at Unknown time  . carvedilol (COREG) 6.25 MG tablet Take 1 tablet (6.25 mg total) by mouth 2 (two) times daily with a meal. 60 tablet 3 01/20/2016 at 2000  . docusate sodium 100 MG CAPS Take 200 mg by mouth at bedtime as needed. (Patient taking differently: Take 100 mg by mouth at bedtime as needed (constipation). ) 60 capsule 0 Past Month at Unknown time  . ferrous sulfate 325 (65 FE) MG tablet Take 325 mg by mouth daily with breakfast.   01/20/2016 at Unknown time  . furosemide (LASIX) 80 MG tablet Take 1 tablet (80 mg total) by mouth daily. 30 tablet 11 01/20/2016 at Unknown time  . gabapentin (NEURONTIN) 600 MG tablet Take 1 tablet (600 mg total) by mouth 3 (three) times daily. Please dose at 8am, 2pm and 8pm 270 tablet 3 01/20/2016 at Unknown time  . guaifenesin (ROBITUSSIN) 100 MG/5ML syrup Take 400 mg by mouth 3 (three) times daily as needed for cough.   01/20/2016 at Unknown time  . ipratropium-albuterol (DUONEB) 0.5-2.5 (3) MG/3ML SOLN Take 3 mLs by nebulization every 6 (six) hours as needed. (Patient taking differently: Take 3 mLs by nebulization every 6 (six) hours as needed (shortness of breath). ) 360 mL 12 01/20/2016 at Unknown time  . levofloxacin (LEVAQUIN) 500 MG tablet Take 500 mg by mouth daily.   01/20/2016 at Unknown time  . potassium chloride SA (K-DUR,KLOR-CON) 20 MEQ tablet Take 20 mEq by mouth daily.   01/20/2016 at Unknown time  . predniSONE (DELTASONE) 10 MG tablet Take 40 mg by mouth daily with breakfast.   01/20/2016 at Unknown time  . Probiotic Product (ALIGN PO) Take 1 tablet by mouth daily.   01/20/2016 at Unknown time  . simvastatin (ZOCOR) 10 MG tablet Take 0.5 tablets (5 mg total) by mouth daily at 6 PM. 45 tablet 3 01/20/2016 at Unknown time  . temazepam (RESTORIL) 15 MG capsule Take 15 mg by mouth at bedtime.    01/20/2016 at Unknown  time   Scheduled Meds: . apixaban  2.5 mg Oral BID  . carvedilol  6.25 mg Oral BID WC  . cefTAZidime (FORTAZ)  IV  1 g Intravenous Daily  . ferrous sulfate  325 mg Oral Q breakfast  . furosemide  80 mg Intravenous Once  . furosemide  80 mg Oral Daily  . gabapentin  600 mg Oral TID  . guaiFENesin  600 mg Oral BID  . guaiFENesin-dextromethorphan  5 mL Oral Q4H  . ipratropium-albuterol  3 mL Nebulization QID  . lisinopril  5 mg Oral Daily  . predniSONE  40 mg Oral Q breakfast  . simvastatin  5 mg Oral q1800   Continuous Infusions:  PRN Meds:.acetaminophen, albuterol Assessment/Plan: Principal Problem:   COPD exacerbation (HCC) Active Problems:   Normocytic anemia   Atrial fibrillation, chronic (HCC)   Moderate to severe pulmonary hypertension, PA pressure in 70s June 2013   Non-ischemic cardiomyopathy EF improved from 30-35% to 40-45% by Echo in 07/2012   LBBB (left bundle branch block)   HTN (hypertension)   Chronic renal impairment, stage 3 (moderate)   Chronic systolic heart failure ([place])  Angela Franco is a 82 year old female with a past medical history of COPD, A. fib, hypertension, CHF, left bundle branch block, nonischemic cardiomyopathy, and hyperlipidemia presenting with a hospital with a complaint of shortness of breath, cough, chest congestion, and fevers for the past 3 days.  Combined CHF Exacerbation: Echo from April 2016 showing ejection fraction of 40-45%, mild aortic regurgitation, and mild mitral regurgitation with D shaped septum concerning for pulmonary HTN 2/2 CHF. Yesterday, patient was given an additional one time Lasix 80 mg IV once in addition to her po Lasix 80 mg po daily to address volume overload. Total output was -700 mL, but net -360 mL. She continues to appear short of breath with crackles on examination. Creatinine has been overall stable with Cr 1.4>1.56 this morning, at baseline. We will repeat another IV lasix dose today to continue diuresis. We could  also consider increasing lisinopril to decrease afterload as BP can tolerate.  -Lasix 80 mg po daily  -Repeat Lasix 80 mg IV once -Coreg 6.25 mg BID -Lisinopril 5 mg daily -Strict I/Os -Daily weights  COPD Exacerbation 2/2 Respiratory Syncytial Virus A: Patient feels more short of breath today with productive cough. She looks uncomfortable and miserable on exam. She continues to sat well on 2 L of oxygen, but her lungs sound terrible. We will continue treatment with antibiotics, duonebs, steroids and diuresis as above. Unfortunately, her recovery may require a lot of supportive care and just additional time.  -Continue Ceftazidime IV 1 g daily -Prednisone 40 mg daily (2/17 >> 2/21) -Robitussin-DM Q4H -Mucinex 600 mg BID -Duonebs QID -Blood cultures 2/16>> NGTD -Outpatient PFTs  Hypokalemia: Potassium 3.3 this morning after diuresis yesterday. -KDur 40 mEq once  Chronic Atrial Fibrillation: Rate controlled on home regimen. -Eliquis 2.5 mg BID -Coreg 6.25 mg BID  Hypertension: Well controlled at 131/67 this morning.  -Coreg 6.25 mg BID -Lisinopril 5 mg daily  Insomnia: Patient was very somnolent yesterday. She received her home medication of Restoril 15 mg QHS and later admitted that she sometimes only takes half.  -Hold Restoril for now -If restarting, would do only 7.5 mg QHS  DVT/PE ppx: Eliquis BID  Dispo: Disposition is deferred at this time, awaiting improvement of current medical problems.  Anticipated discharge in approximately 2-3 day(s).   The patient does have a current PCP Angela Pearson, MD) and does not need an [facility] hospital follow-up appointment after discharge.  The patient does not have transportation limitations that hinder transportation to clinic appointments.   LOS: 2 days   Osa Craver, DO PGY-2 Internal Medicine Resident Pager # [phone] 01/23/2016 11:56 AM

## 2016-01-23 NOTE — Progress Notes (Signed)
Internal Medicine Attending  Date: 01/23/2016  Patient name: Angela Franco Medical record number: [id number] Date of birth: 1929-08-05 Age: 82 y.o. Gender: female  I saw and evaluated the patient. I reviewed the resident's note by Dr. Marvel Plan and I agree with the resident's findings and plans as documented in her progress note.  Ms. Rydalch feels unchanged over the last 24 hours with regards to her dyspnea. She's had difficulty sleeping secondary to her cough. She feels less dyspneic when she leans forward. Physical exam again reveals bibasilar inspiratory squeaking with moist sounding rhonchi. Her heart is irregularly irregular but otherwise the sounds are distant.   This is one of those all to common, yet clinically difficult scenarios with an elderly patient presenting with dyspnea and multiple potential causes. Although she was labeled as having COPD, mainly because she was empirically started on Spiriva, I am thinking her process is less so given her complete lack of risk factors. It would have been wonderful to have PFTs on file to assess the degree of her underlying lung dysfunction, but these are not available so we have to make a clinical determination.   Her respiratory panel did return positive for respiratory syncytial virus. She just recovered from influenza last month. It is quite possible that her symptoms are related to a viral pneumonia related to RSV. Given her underlying heart disease and elderly status it is not inconceivable this could be symptomatic. Unfortunately, in the adult population that is immunocompetent there is no real role for anti-viral agents and care is supportive.   I continue to be concerned about the possibility of heart failure. She had an ejection fraction of 40% back in April 2016 with elevated right ventricular filling pressures and the suggestion of pulmonary artery hypertension. Her heart function may be worse or her pulmonary pressures may have worsened in  the interim. This possibility will be explored with a repeat Echo.  We are empirically trying to diurese her with IV Lasix and escalating her afterload over the last 2 days. If not improved by tomorrow morning we will obtain an echocardiogram to reassess both her left ventricular ejection fraction as well as her likely pulmonary hypertension and possible diastolic dysfunction. If she has pulmonary hypertension that is significant it is likely related to her underlying heart disease and therapy will mainly be aggressive treatment of her heart failure. As noted above, we are attempting IV diuresis and afterload reduction to achieve this.   Further assessment and plan is pending her response to the above interventions and the information received from the above testing.

## 2016-01-23 NOTE — Clinical Social Work Note (Signed)
CSW faxed updated clinicals to [facility] per family's request, unable to obtain [facility] number.  Weekday CSW to contact [facility] to obtain number.  CSW continuing to follow.  Jones Broom. [place], MSW, [place] 01/23/2016 5:27 PM

## 2016-01-24 LAB — BASIC METABOLIC PANEL
ANION GAP: 10 (ref 5–15)
BUN: 42 mg/dL — AB (ref 6–20)
CALCIUM: 8.9 mg/dL (ref 8.9–10.3)
CO2: 36 mmol/L — ABNORMAL HIGH (ref 22–32)
Chloride: 87 mmol/L — ABNORMAL LOW (ref 101–111)
Creatinine, Ser: 1.38 mg/dL — ABNORMAL HIGH (ref 0.44–1.00)
GFR calc Af Amer: 39 mL/min — ABNORMAL LOW (ref >60)
GFR, EST NON AFRICAN AMERICAN: 34 mL/min — AB (ref >60)
GLUCOSE: 108 mg/dL — AB (ref 65–99)
Potassium: 3.3 mmol/L — ABNORMAL LOW (ref 3.5–5.1)
Sodium: 133 mmol/L — ABNORMAL LOW (ref 135–145)

## 2016-01-24 LAB — GLUCOSE, CAPILLARY: Glucose-Capillary: 135 mg/dL — ABNORMAL HIGH (ref 65–99)

## 2016-01-24 MED ORDER — POTASSIUM CHLORIDE CRYS ER 20 MEQ PO TBCR
40.0000 meq | EXTENDED_RELEASE_TABLET | Freq: Once | ORAL | Status: AC
  Administered 2016-01-24: 40 meq via ORAL
  Filled 2016-01-24: qty 2

## 2016-01-24 MED ORDER — LISINOPRIL 5 MG PO TABS: 5.0000 mg | ORAL_TABLET | Freq: Once | ORAL | Status: DC

## 2016-01-24 MED ORDER — FUROSEMIDE 10 MG/ML IJ SOLN
80.0000 mg | Freq: Once | INTRAMUSCULAR | Status: AC
  Administered 2016-01-24: 80 mg via INTRAVENOUS
  Filled 2016-01-24: qty 8

## 2016-01-24 MED ORDER — LISINOPRIL 10 MG PO TABS: 10.0000 mg | ORAL_TABLET | Freq: Every day | ORAL | Status: DC

## 2016-01-24 NOTE — Care Management Important Message (Signed)
Important Message  Patient Details  Name: Angela Franco MRN: [id number] Date of Birth: 1929/03/18   Medicare Important Message Given:  Yes    Loann Quill 01/24/2016, 11:09 AM

## 2016-01-24 NOTE — Progress Notes (Signed)
Subjective:  Angela Franco. Patient reports improved breathing today compared to when she presented. She is having continued cough productive of yellow sputum, but it is also improving.  Unfortunately, she continues to feel fatigued with decreased appetite, but she feels better overall.  Objective: Vital signs in last 24 hours: Filed Vitals:   01/23/16 1650 01/23/16 1929 01/23/16 2027 01/24/16 0500  BP:   127/67 140/76  Pulse:   60 79  Temp:   97.9 F (36.6 C) 98.2 F (36.8 C)  TempSrc:   Oral Oral  Resp:   18 20  Height:      Weight:    131 lb 6.3 oz (59.6 kg)  SpO2: 99% 98% 98% 99%   Weight change: -1 lb 1.6 oz (-0.5 kg)  Intake/Output Summary (Last 24 hours) at 01/24/16 0717 Last data filed at 01/23/16 2200  Gross per 24 hour  Intake    840 ml  Output    950 ml  Net   -110 ml   General: Vital signs reviewed. Patient is elderly, ill appearing and cooperative with exam.  Cardiovascular: Irregularly irregular. Heart sounds distant.  Pulmonary/Chest: Diffuse inspiratory high pitched breath sounds, diffuse expiratory wheezes and scattered rhonchi.  No crackles appreciated. Extremities: Trace pitting edema in lower extremities bilaterally Neuro: Fatigued.  Lab Results: Basic Metabolic Panel:  Recent Labs Lab 01/23/16 0300 01/24/16 0403  NA 135 133*  K 3.3* 3.3*  CL 88* 87*  CO2 35* 36*  GLUCOSE 104* 108*  BUN 48* 42*  CREATININE 1.56* 1.38*  CALCIUM 8.7* 8.9   Liver Function Tests:  Recent Labs Lab 01/20/16 2240  AST 29  ALT 17  ALKPHOS 78  BILITOT 0.6  PROT 8.4*  ALBUMIN 3.2*   CBC:  Recent Labs Lab 01/20/16 2240  WBC 6.1  NEUTROABS 4.9  HGB 10.3*  HCT 32.4*  MCV 96.1  PLT 172   Coagulation:  Recent Labs Lab 01/21/16 0116  LABPROT 19.4*  INR 1.63*   Urine Drug Screen: Drugs of Abuse     Component Value Date/Time   LABOPIA POSITIVE* 08/24/2012 1119   COCAINSCRNUR NONE DETECTED 08/24/2012 1119   LABBENZ NONE DETECTED 08/24/2012 1119   AMPHETMU NONE DETECTED 08/24/2012 1119   THCU NONE DETECTED 08/24/2012 1119   LABBARB NONE DETECTED 08/24/2012 1119    Urinalysis:  Recent Labs Lab 01/21/16 0631  COLORURINE YELLOW  LABSPEC 1.015  PHURINE 5.5  GLUCOSEU NEGATIVE  HGBUR TRACE*  BILIRUBINUR NEGATIVE  KETONESUR NEGATIVE  PROTEINUR NEGATIVE  NITRITE NEGATIVE  LEUKOCYTESUR NEGATIVE   Micro Results: Recent Results (from the past 240 hour(s))  Blood culture (routine x 2)     Status: None (Preliminary result)   Collection Time: 01/20/16 10:25 PM  Result Value Ref Range Status   Specimen Description BLOOD RIGHT ANTECUBITAL  Final   Special Requests BOTTLES DRAWN AEROBIC AND ANAEROBIC 5CC  Final   Culture NO GROWTH 3 DAYS  Final   Report Status PENDING  Incomplete  Blood culture (routine x 2)     Status: None (Preliminary result)   Collection Time: 01/20/16 10:30 PM  Result Value Ref Range Status   Specimen Description BLOOD RIGHT HAND  Final   Special Requests BOTTLES DRAWN AEROBIC AND ANAEROBIC 5CC  Final   Culture NO GROWTH 3 DAYS  Final   Report Status PENDING  Incomplete  Respiratory virus panel     Status: Abnormal   Collection Time: 01/21/16  5:56 AM  Result Value Ref Range Status  Respiratory Syncytial Virus A Positive (A) Negative Final   Respiratory Syncytial Virus B Negative Negative Final   Influenza A Negative Negative Final   Influenza B Negative Negative Final   Parainfluenza 1 Negative Negative Final   Parainfluenza 2 Negative Negative Final   Parainfluenza 3 Negative Negative Final   Metapneumovirus Negative Negative Final   Rhinovirus Negative Negative Final   Adenovirus Negative Negative Final    Comment: (NOTE) Performed At: [facility] [address] [place], [place] [id number] Lindon Romp MD [id number]    Medications:  I have reviewed the patient's current medications. Prior to Admission:  Prescriptions prior to admission  Medication Sig Dispense Refill Last Dose   . apixaban (ELIQUIS) 2.5 MG TABS tablet Take 1 tablet (2.5 mg total) by mouth 2 (two) times daily. 60 tablet 11 01/20/2016 at Unknown time  . calcium-vitamin D (OSCAL WITH D) 500-200 MG-UNIT tablet Take 1 tablet by mouth daily with breakfast.   01/20/2016 at Unknown time  . carvedilol (COREG) 6.25 MG tablet Take 1 tablet (6.25 mg total) by mouth 2 (two) times daily with a meal. 60 tablet 3 01/20/2016 at 2000  . docusate sodium 100 MG CAPS Take 200 mg by mouth at bedtime as needed. (Patient taking differently: Take 100 mg by mouth at bedtime as needed (constipation). ) 60 capsule 0 Past Month at Unknown time  . ferrous sulfate 325 (65 FE) MG tablet Take 325 mg by mouth daily with breakfast.   01/20/2016 at Unknown time  . furosemide (LASIX) 80 MG tablet Take 1 tablet (80 mg total) by mouth daily. 30 tablet 11 01/20/2016 at Unknown time  . gabapentin (NEURONTIN) 600 MG tablet Take 1 tablet (600 mg total) by mouth 3 (three) times daily. Please dose at 8am, 2pm and 8pm 270 tablet 3 01/20/2016 at Unknown time  . guaifenesin (ROBITUSSIN) 100 MG/5ML syrup Take 400 mg by mouth 3 (three) times daily as needed for cough.   01/20/2016 at Unknown time  . ipratropium-albuterol (DUONEB) 0.5-2.5 (3) MG/3ML SOLN Take 3 mLs by nebulization every 6 (six) hours as needed. (Patient taking differently: Take 3 mLs by nebulization every 6 (six) hours as needed (shortness of breath). ) 360 mL 12 01/20/2016 at Unknown time  . levofloxacin (LEVAQUIN) 500 MG tablet Take 500 mg by mouth daily.   01/20/2016 at Unknown time  . potassium chloride SA (K-DUR,KLOR-CON) 20 MEQ tablet Take 20 mEq by mouth daily.   01/20/2016 at Unknown time  . predniSONE (DELTASONE) 10 MG tablet Take 40 mg by mouth daily with breakfast.   01/20/2016 at Unknown time  . Probiotic Product (ALIGN PO) Take 1 tablet by mouth daily.   01/20/2016 at Unknown time  . simvastatin (ZOCOR) 10 MG tablet Take 0.5 tablets (5 mg total) by mouth daily at 6 PM. 45 tablet 3 01/20/2016  at Unknown time  . temazepam (RESTORIL) 15 MG capsule Take 15 mg by mouth at bedtime.    01/20/2016 at Unknown time   Scheduled Meds: . apixaban  2.5 mg Oral BID  . carvedilol  6.25 mg Oral BID WC  . cefTAZidime (FORTAZ)  IV  1 g Intravenous Daily  . feeding supplement  1 Container Oral TID BM  . ferrous sulfate  325 mg Oral Q breakfast  . furosemide  80 mg Oral Daily  . gabapentin  600 mg Oral TID  . guaiFENesin  600 mg Oral BID  . guaiFENesin-dextromethorphan  5 mL Oral Q4H  . ipratropium-albuterol  3  mL Nebulization QID  . lisinopril  5 mg Oral Daily  . potassium chloride  40 mEq Oral Once  . predniSONE  40 mg Oral Q breakfast  . simvastatin  5 mg Oral q1800   Continuous Infusions:  PRN Meds:.acetaminophen, albuterol Assessment/Plan: Principal Problem:   COPD exacerbation (HCC) Active Problems:   Normocytic anemia   Atrial fibrillation, chronic (HCC)   Moderate to severe pulmonary hypertension, PA pressure in 70s June 2013   Non-ischemic cardiomyopathy EF improved from 30-35% to 40-45% by Echo in 07/2012   LBBB (left bundle branch block)   HTN (hypertension)   Chronic renal impairment, stage 3 (moderate)   Chronic systolic heart failure (HCC)   Acute on chronic systolic heart failure (HCC)   Respiratory syncytial virus (RSV) infection  Ms. Tomey is a 82 year old female with a past medical history of COPD, A. fib, hypertension, CHF, left bundle branch block, nonischemic cardiomyopathy, and hyperlipidemia presenting with a hospital with a complaint of shortness of breath, cough, chest congestion, and fevers for 3 days.  Currently unclear whether symptoms are due to RSV provoked COPD exacerbation (as no PFTs to diagnose COPD), RSV provoked CHF exacerbation, or worsening cardiac function of other etiology.   COPD Exacerbation 2/2 RSV A, improving: Patient feels better and appears comfortable today, though she has continued productive cough. She continues to sat well on 2 L of  oxygen, but her lungs continue to have diffuse wheezing, high pitched inspiratory sounds, and rhonchi. We will continue treatment with antibiotics, duonebs, steroids and diuresis as above. Unfortunately, her recovery may require a lot of supportive care and just additional time.  -Continue Ceftazidime IV 1 g daily -Prednisone 40 mg daily (2/17 >> 2/21) -Robitussin-DM Q4H -Mucinex 600 mg BID -Duonebs QID -Blood cultures 2/16>> NGTD -Outpatient PFTs  Combined CHF Exacerbation: Echo from April 2016 showing ejection fraction of 40-45%, mild aortic regurgitation, and mild mitral regurgitation with D shaped septum concerning for pulmonary HTN 2/2 CHF. She is only net negative 650 mL after two additional Lasix 80 mg IV boluses. She was not tachypneic this morning, but breath sounds are far from clear. Creatinine has been overall stable at Cr 1.4, at baseline. We will repeat another IV lasix dose today to continue diuresis and consider Lasix 80 mg PO BID on discharge. We will also increase to Lisinopril 10 mg daily for further afterload reduction.   -Lasix 80 mg po daily  -Repeat Lasix 80 mg IV once -Coreg 6.25 mg BID -Lisinopril 5 mg daily [ ]  TTE -Strict I/Os -Daily weights  Hypokalemia: Potassium 3.3 this morning after diuresis yesterday. -KDur 40 mEq once  Chronic Atrial Fibrillation: Rate controlled on home regimen. -Eliquis 2.5 mg BID -Coreg 6.25 mg BID  Hypertension: Well controlled at 131/67 this morning.  -Coreg 6.25 mg BID -Lisinopril 5 mg daily  Insomnia: Patient was very somnolent yesterday. She received her home medication of Restoril 15 mg QHS and later admitted that she sometimes only takes half.  -Hold Restoril for now -If restarting, would do only 7.5 mg QHS  DVT/PE ppx: Eliquis BID  Dispo: Disposition is deferred at this time, awaiting improvement of current medical problems.  Anticipated discharge in approximately 2-3 day(s).   The patient does have a current PCP  Marylynn Pearson, MD) and does not need an [facility] hospital follow-up appointment after discharge.  The patient does not have transportation limitations that hinder transportation to clinic appointments.   LOS: 3 days   Viviano Simas, MD 01/24/2016 7:17  AM

## 2016-01-24 NOTE — Progress Notes (Signed)
Internal Medicine Attending  Date: 01/24/2016  Patient name: Angela Franco Medical record number: [id number] Date of birth: 12-Mar-1929 Age: 82 y.o. Gender: female  I saw and evaluated the patient. I reviewed the resident's note by Dr. Lovena Le and I agree with the resident's findings and plans as documented in her progress note.  On rounds this morning [name] looked much improved. She appeared more rested and comfortable. She was no longer leaning forward to breathe. Her physical exam was notable for continued inspiratory rhonchi but less squeaks and wet sounding. An echocardiogram was obtained today and the final read is pending. This evening she apparently became lightheaded and was noted to be mildly hypotensive with systolics in the mid [id number]. We will back off on the aggressive diuresis and monitor her on telemetry overnight and reassess in the morning. I am hopeful we will have a better feel for the cause of her dyspnea and a direction in which to go based on her clinical response to our interventions and the results of the echocardiogram.

## 2016-01-24 NOTE — Progress Notes (Signed)
Utilization review completed.  

## 2016-01-24 NOTE — Discharge Instructions (Addendum)
1. Follow up with your PCP and Cardiologist. 2. Continue Lasix 80 mg by mouth every morning.     Information on my medicine - ELIQUIS (apixaban)  This medication education was reviewed with me or my healthcare representative as part of my discharge preparation.  The pharmacist that spoke with me during my hospital stay was:  Romona Curls, [facility]  Why was Eliquis prescribed for you? Eliquis was prescribed for you to reduce the risk of a blood clot forming that can cause a stroke if you have a medical condition called atrial fibrillation (a type of irregular heartbeat).  What do You need to know about Eliquis ? Take your Eliquis TWICE DAILY - one tablet in the morning and one tablet in the evening with or without food. If you have difficulty swallowing the tablet whole please discuss with your pharmacist how to take the medication safely.  Take Eliquis exactly as prescribed by your doctor and DO NOT stop taking Eliquis without talking to the doctor who prescribed the medication.  Stopping may increase your risk of developing a stroke.  Refill your prescription before you run out.  After discharge, you should have regular check-up appointments with your healthcare provider that is prescribing your Eliquis.  In the future your dose may need to be changed if your kidney function or weight changes by a significant amount or as you get older.  What do you do if you miss a dose? If you miss a dose, take it as soon as you remember on the same day and resume taking twice daily.  Do not take more than one dose of ELIQUIS at the same time to make up a missed dose.  Important Safety Information A possible side effect of Eliquis is bleeding. You should call your healthcare provider right away if you experience any of the following: ? Bleeding from an injury or your nose that does not stop. ? Unusual colored urine (red or dark brown) or unusual colored stools (red or black). ? Unusual bruising  for unknown reasons. ? A serious fall or if you hit your head (even if there is no bleeding).  Some medicines may interact with Eliquis and might increase your risk of bleeding or clotting while on Eliquis. To help avoid this, consult your healthcare provider or pharmacist prior to using any new prescription or non-prescription medications, including herbals, vitamins, non-steroidal anti-inflammatory drugs (NSAIDs) and supplements.  This website has more information on Eliquis (apixaban): http://www.eliquis.com/eliquis/home

## 2016-01-24 NOTE — Progress Notes (Signed)
PASAR # received: [id number]   CSW will continue to follow for pt transfer to [facility] for short term rehab  Domenica Reamer, [place] Worker [phone]

## 2016-01-24 NOTE — Progress Notes (Signed)
Patient complaining of feeling dizzy and light-headed.  She recently received dose of coreg.  BP 86/54; HR fluctuated between 30-70.  Dr. Lovena Le notified.  Will place patient back on cardiac monitoring and continue to monitor.

## 2016-01-24 NOTE — Discharge Summary (Signed)
Name: Angela Franco MRN: [id number] DOB: Nov 22, 1929 82 y.o. PCP: Marylynn Pearson, MD  Date of Admission: 01/20/2016  9:59 PM Date of Discharge: 01/26/2016 Attending Physician: Oval Linsey, MD  Discharge Diagnosis: 1. RSV PNA 2. Combined CHF Exacerbation   Principal Problem:   Acute on chronic systolic heart failure (HCC) Active Problems:   Normocytic anemia   Atrial fibrillation, chronic (HCC)   Moderate to severe pulmonary hypertension, PA pressure in 70s June 2013   LBBB (left bundle branch block)   HTN (hypertension)   Chronic renal impairment, stage 3 (moderate)   Chronic systolic heart failure (HCC)   Respiratory syncytial virus (RSV) infection  Discharge Medications:   Medication List    STOP taking these medications        guaifenesin 100 MG/5ML syrup  Commonly known as:  ROBITUSSIN     levofloxacin 500 MG tablet  Commonly known as:  LEVAQUIN     predniSONE 10 MG tablet  Commonly known as:  DELTASONE      TAKE these medications        ALIGN PO  Take 1 tablet by mouth daily.     apixaban 2.5 MG Tabs tablet  Commonly known as:  ELIQUIS  Take 1 tablet (2.5 mg total) by mouth 2 (two) times daily.     calcium-vitamin D 500-200 MG-UNIT tablet  Commonly known as:  OSCAL WITH D  Take 1 tablet by mouth daily with breakfast.     carvedilol 6.25 MG tablet  Commonly known as:  COREG  Take 1 tablet (6.25 mg total) by mouth 2 (two) times daily with a meal.     DSS 100 MG Caps  Take 200 mg by mouth at bedtime as needed.     feeding supplement Liqd  Take 1 Container by mouth 3 (three) times daily between meals.     ferrous sulfate 325 (65 FE) MG tablet  Take 325 mg by mouth daily with breakfast.     furosemide 80 MG tablet  Commonly known as:  LASIX  Take 1 tablet (80 mg total) by mouth daily.     gabapentin 600 MG tablet  Commonly known as:  NEURONTIN  Take 1 tablet (600 mg total) by mouth 3 (three) times daily. Please dose at 8am, 2pm and 8pm     guaiFENesin-dextromethorphan 100-10 MG/5ML syrup  Commonly known as:  ROBITUSSIN DM  Take 5 mLs by mouth every 4 (four) hours.     ipratropium-albuterol 0.5-2.5 (3) MG/3ML Soln  Commonly known as:  DUONEB  Take 3 mLs by nebulization every 6 (six) hours as needed.     lisinopril 10 MG tablet  Commonly known as:  PRINIVIL,ZESTRIL  Take 1 tablet (10 mg total) by mouth daily.     potassium chloride SA 20 MEQ tablet  Commonly known as:  K-DUR,KLOR-CON  Take 20 mEq by mouth daily.     senna-docusate 8.6-50 MG tablet  Commonly known as:  Senokot-S  Take 1 tablet by mouth 2 (two) times daily.     simvastatin 10 MG tablet  Commonly known as:  ZOCOR  Take 0.5 tablets (5 mg total) by mouth daily at 6 PM.     temazepam 7.5 MG capsule  Commonly known as:  RESTORIL  Take 1 capsule (7.5 mg total) by mouth at bedtime as needed for sleep.        Disposition and follow-up:   Angela Franco was discharged from [facility] in Good condition.  At the  hospital follow up visit please address:  1.  Volume status, need for Lasix dosing adjustment, dyspnea, Cardiology follow up  2.  Labs / imaging needed at time of follow-up: none  3.  Pending labs/ test needing follow-up: TTE  Follow-up Appointments: Follow-up Information    Follow up with Marylynn Pearson, MD. Schedule an appointment as soon as possible for a visit in 2 weeks.   Specialties:  Internal Medicine, Geriatric Medicine   Why:  hospital f/u   Contact information:   [address] [place] [address] [phone]       Discharge Instructions: Discharge Instructions    Call MD for:  difficulty breathing, headache or visual disturbances    Complete by:  As directed      Call MD for:  extreme fatigue    Complete by:  As directed      Call MD for:  persistant dizziness or light-headedness    Complete by:  As directed      Increase activity slowly    Complete by:  As directed            Consultations:     Procedures Performed:  Dg Chest 2 View  01/20/2016  CLINICAL DATA:  Cough, congestion, shortness of breath, low-grade fever possible pneumonia EXAM: CHEST  2 VIEW COMPARISON:  01/03/2016 FINDINGS: Cardiomegaly again noted. No pulmonary edema. Mild perihilar bronchitic changes. There is streaky right infrahilar infiltrate or bronchitic changes. Thoracic spine osteopenia. Mild degenerative changes lower thoracic and lumbar spine. IMPRESSION: Mild perihilar bronchitic changes. Streaky infiltrate or bronchitic changes right infrahilar region. No pulmonary edema. Electronically Signed   By: Lahoma Crocker M.D.   On: 01/20/2016 16:20   Dg Chest 2 View  01/03/2016  CLINICAL DATA:  Three days of cough and shortness of breath ; history of cardiomyopathy, COPD. EXAM: CHEST  2 VIEW COMPARISON:  Portable chest x-ray of January 02, 2016 FINDINGS: The lungs are mildly hyperinflated. There is no focal infiltrate. Subtle interstitial density is present in the right infrahilar region. There is no alveolar infiltrate. There is no pleural effusion or pneumothorax. There is minimal apical pleural thickening bilaterally. The cardiac silhouette is mildly enlarged but stable. The pulmonary vascularity is normal. The mediastinum is normal in width. The observed bony thorax exhibits no acute abnormality. There are degenerative changes of both shoulders. IMPRESSION: Hyperinflation consistent with reactive airway disease. Patchy subsegmental atelectasis or infiltrate in the right infrahilar region. There is no alveolar pneumonia. Interval decrease in pulmonary interstitial edema. Electronically Signed   By: David  Martinique M.D.   On: 01/03/2016 07:37   Dg Ribs Unilateral Right  12/29/2015  CLINICAL DATA:  Fall today. Right rib pain and bruising. Initial encounter. EXAM: RIGHT RIBS - 2 VIEW COMPARISON:  None. FINDINGS: No acute right-sided rib fractures identified. No other bone lesions identified. Prominent anterior costochondral  calcification noted. No pneumothorax or hemothorax visualized. IMPRESSION: No acute right rib fractures identified. Electronically Signed   By: Earle Gell M.D.   On: 12/29/2015 14:02   Dg Chest Portable 1 View  01/20/2016  CLINICAL DATA:  82 year old female with cough and shortness of breath EXAM: PORTABLE CHEST 1 VIEW COMPARISON:  Radiograph dated 01/20/2016 FINDINGS: Single-view of the chest demonstrates emphysematous changes of the lungs. There are mild bronchiectatic changes. No focal consolidation, pleural effusion, or pneumothorax. Stable cardiomegaly. No acute osseous pathology. IMPRESSION: No active disease. Electronically Signed   By: Anner Crete M.D.   On: 01/20/2016 22:29   Dg Chest  Portable 1 View  01/02/2016  CLINICAL DATA:  Productive cough and shortness of breath. EXAM: PORTABLE CHEST 1 VIEW COMPARISON:  12/29/2015 FINDINGS: Persisting cardiomegaly. Atherosclerosis of the aorta again noted. Venous hypertension without frank edema. No infiltrate, collapse or effusion. No acute bone finding. IMPRESSION: Cardiomegaly and pulmonary venous hypertension. No frank edema. No infiltrate or collapse. Electronically Signed   By: Nelson Chimes M.D.   On: 01/02/2016 13:09   Dg Chest Portable 1 View  12/29/2015  CLINICAL DATA:  Cough, congestion EXAM: PORTABLE CHEST 1 VIEW COMPARISON:  08/23/2012 FINDINGS: There is cardiomegaly with vascular congestion. Mild interstitial prominence could reflect interstitial edema. There is hyperinflation of the lungs compatible with COPD. No effusions or acute bony abnormality. IMPRESSION: COPD. Question mild interstitial edema/ CHF. Electronically Signed   By: Rolm Baptise M.D.   On: 12/29/2015 11:58    2D Echo: Study Conclusions  - Left ventricle: The cavity size was normal. Systolic function was normal. The estimated ejection fraction was in the range of 55% to 60%. Wall motion was normal; there were no regional wall motion abnormalities. The study  was not technically sufficient to allow evaluation of LV diastolic dysfunction due to atrial fibrillation. - Aortic valve: Moderate diffuse thickening and calcification, consistent with sclerosis. There was mild regurgitation. - Mitral valve: Mild focal calcification of the anterior leaflet (medial segment(s)). Trivial, late systolicprolapse, involving the anterior leaflet. There was mild regurgitation. Valve area by continuity equation (using LVOT flow): 3.45 cm^2. - Left atrium: The atrium was mildly dilated. - Right ventricle: The cavity size was moderately dilated. Wall thickness was normal. Systolic function was moderately reduced. - Right atrium: The atrium was moderately dilated. - Tricuspid valve: There was mild regurgitation. - Pulmonic valve: There was trivial regurgitation. - Pulmonary arteries: PA peak pressure: 33 mm Hg (S).  Cardiac Cath:   Admission HPI: Patient is a 82 year old female with a past medical history of COPD, A. fib, hypertension, CHF, left bundle branch block, nonischemic cardiomyopathy, and hyperlipidemia presenting with a hospital with a complaint of shortness of breath, cough, chest congestion, and fevers for the past 3 days. States she has been having fevers with temp > 100F. As per patient, her symptoms are getting progressively worse. Reports having rhinorrhea, cough productive of yellow phlegm, and decreased by mouth intake. Denies having any recent sick contacts or travel. Denies having any chest pain. States she is compliant with her medications at home and currently uses 2 L of oxygen at all times, previously did not require oxygen all the time. Denies having any body aches, headaches, nausea, or vomiting. Patient was recently discharged from [facility] on [id number] after being treated for COPD exacerbation and influenza. She was discharged on a steroid taper and ipratropium-albuterol nebulizers. She lives at an assisted living  facility and was seen by a physician there, chest x-ray was ordered but results pending. She was given antibiotics and steroids but her symptoms continued to worsen. Upon EMS arrival, patient was found to have diffuse rhonchi, O2 saturation 84% on 4 L oxygen. She was started on CPAP and given one tablet of sublingual nitroglycerin. Also given Solu-Medrol and DuoNeb and O2 saturation improved to 97%.  Hospital Course by problem list: Principal Problem:   Acute on chronic systolic heart failure [facility]) Active Problems:   Normocytic anemia   Atrial fibrillation, chronic (HCC)   Moderate to severe pulmonary hypertension, PA pressure in 70s June 2013   LBBB (left bundle branch block)   HTN (  hypertension)   Chronic renal impairment, stage 3 (moderate)   Chronic systolic heart failure (HCC)   Respiratory syncytial virus (RSV) infection    CHF Exacerbation 2/2 RSV PNA: Patient presented with dyspnea and acute volume overload.  Respiratory viral panel was positive for RSV A.  There were initial concerns for COPD exacerbation, but patient has no documented PFTs or risk factors for COPD.  Therefore, her dyspnea was felt to be most consistent with acute heart failure exacerbation.  She was diuresed with additional doses of Lasix 80 mg IV once daily.  Her dyspnea, cough, and pulmonary exam improved over the course of hospitalization. Repeat showed normal EF (55-60%) and unable to assess diastolic function. She was discharged on her home Lasix 80 mg PO once daily.  On day of discharge, patient felt "great" and she was discharged to SNF.  Discharge Vitals:   BP 135/67 mmHg  Pulse 55  Temp(Src) 97.8 F (36.6 C) (Oral)  Resp 18  Ht 5\' 3"  (1.6 m)  Wt 127 lb 8 oz (57.834 kg)  BMI 22.59 kg/m2  SpO2 100%  Discharge Labs:  No results found for this or any previous visit (from the past 24 hour(s)).  Signed: Iline Oven, MD 01/26/2016, 9:00 AM    Services Ordered on Discharge:  Equipment Ordered on  Discharge: Rolling walker with 5" wheels

## 2016-01-25 ENCOUNTER — Inpatient Hospital Stay (HOSPITAL_COMMUNITY): Payer: Medicare Other

## 2016-01-25 LAB — CULTURE, BLOOD (ROUTINE X 2)
CULTURE: NO GROWTH
Culture: NO GROWTH

## 2016-01-25 LAB — BASIC METABOLIC PANEL
Anion gap: 10 (ref 5–15)
BUN: 42 mg/dL — AB (ref 6–20)
CHLORIDE: 88 mmol/L — AB (ref 101–111)
CO2: 39 mmol/L — AB (ref 22–32)
CREATININE: 1.36 mg/dL — AB (ref 0.44–1.00)
Calcium: 9 mg/dL (ref 8.9–10.3)
GFR calc Af Amer: 40 mL/min — ABNORMAL LOW (ref >60)
GFR calc non Af Amer: 35 mL/min — ABNORMAL LOW (ref >60)
GLUCOSE: 97 mg/dL (ref 65–99)
POTASSIUM: 4 mmol/L (ref 3.5–5.1)
Sodium: 137 mmol/L (ref 135–145)

## 2016-01-25 MED ORDER — LISINOPRIL 10 MG PO TABS
10.0000 mg | ORAL_TABLET | Freq: Every day | ORAL | Status: DC
  Administered 2016-01-26: 10 mg via ORAL
  Filled 2016-01-25: qty 1

## 2016-01-25 MED ORDER — IPRATROPIUM-ALBUTEROL 0.5-2.5 (3) MG/3ML IN SOLN
3.0000 mL | Freq: Three times a day (TID) | RESPIRATORY_TRACT | Status: DC
  Administered 2016-01-25 – 2016-01-26 (×2): 3 mL via RESPIRATORY_TRACT
  Filled 2016-01-25 (×3): qty 3

## 2016-01-25 MED ORDER — LISINOPRIL 5 MG PO TABS: 5.0000 mg | ORAL_TABLET | Freq: Every day | ORAL | Status: DC

## 2016-01-25 MED ORDER — FUROSEMIDE 10 MG/ML IJ SOLN
40.0000 mg | Freq: Once | INTRAMUSCULAR | Status: AC
  Administered 2016-01-25: 40 mg via INTRAVENOUS
  Filled 2016-01-25: qty 4

## 2016-01-25 MED ORDER — FLUTICASONE PROPIONATE 50 MCG/ACT NA SUSP
2.0000 | Freq: Every day | NASAL | Status: AC
  Administered 2016-01-25: 2 via NASAL
  Filled 2016-01-25: qty 16

## 2016-01-25 NOTE — Progress Notes (Signed)
Physical Therapy Treatment Patient Details Name: Angela Franco MRN: [id number] DOB: [birth year] Today's Date: 01/25/2016    History of Present Illness 82 yo female with onset of acute respiratory failure after onset cough and SOB.  PMHx:  COPD, a-fib, CHF with EF 40-45%, L BBB, HTN    PT Comments    Pt demonstrated improved activity tolerance.  Pt ambulated 150 ft x 2 with min guard.  Pt reports tingling in B LEs and O2 sats decreased to 84% on 2L.  Pt O2 sats improved to 94% on 3L and remained above 90% for second gait trial.  Pt tolerated tx well requiring one seated rest period.    Follow Up Recommendations        Equipment Recommendations  Rolling walker with 5" wheels (unless pt has adequte equipment.  )    Recommendations for Other Services       Precautions / Restrictions Precautions Precautions: Fall Restrictions Weight Bearing Restrictions: No    Mobility  Bed Mobility Overal bed mobility:  (Pt recieved in recliner chair on arrival.  )                Transfers Overall transfer level: Needs assistance Equipment used: Rolling walker (2 wheeled) Transfers: Sit to/from Stand Sit to Stand: Supervision Stand pivot transfers: Supervision       General transfer comment: supervision from chair and 3:1 commode.  Pt required cues for hand placement to improve safety.    Ambulation/Gait Ambulation/Gait assistance: Min guard Ambulation Distance (Feet): 150 Feet (x2) Assistive device: Rolling walker (2 wheeled) Gait Pattern/deviations: Step-through pattern;Trunk flexed     General Gait Details: RW remains for balance as pt intially pulling on counter and holding to chair rails in standing.  Pts O2 sats dropped to 84% on first trial at 2L increased to 3L and O2 sats remain 93% during gait training. Pt required cues for safety with RW to maintain position.  Cues for erect posture.  Increased cadence noted during second trial.     Stairs             Wheelchair Mobility    Modified Rankin (Stroke Patients Only)       Balance     Sitting balance-Leahy Scale: Good       Standing balance-Leahy Scale: Fair                      Cognition Arousal/Alertness: Awake/alert Behavior During Therapy: WFL for tasks assessed/performed Overall Cognitive Status: Within Functional Limits for tasks assessed                      Exercises General Exercises - Lower Extremity Ankle Circles/Pumps: AROM;20 reps;Seated    General Comments        Pertinent Vitals/Pain Pain Assessment: No/denies pain    Home Living                      Prior Function            PT Goals (current goals can now be found in the care plan section) Acute Rehab PT Goals Potential to Achieve Goals: Good Progress towards PT goals: Progressing toward goals    Frequency  Min 2X/week    PT Plan      Co-evaluation             End of Session Equipment Utilized During Treatment: Gait belt;Oxygen Activity Tolerance: Patient tolerated treatment well Patient left: in  chair;with call bell/phone within reach     Time: 1028-1101 PT Time Calculation (min) (ACUTE ONLY): 33 min  Charges:  $Gait Training: 23-37 mins                    G Codes:      Cristela Blue 02-07-16, 11:11 AM  Governor Rooks, PTA pager [phone]

## 2016-01-25 NOTE — Progress Notes (Signed)
Subjective: Overnight, she reports feeling a sudden drop in energy around 7pm in the evening and her systolic blood pressure were noted to be in the 80's as noted per nursing. This morning she denies any dizziness, or shortness of breath, but says that she has not really been up and walking around. She is still having some chest congestion, and cough w/sputum production. In addition, she notes having some congestion.  Objective: Vital signs in last 24 hours: Filed Vitals:   01/25/16 0405 01/25/16 0827 01/25/16 0844 01/25/16 1105  BP:   104/46   Pulse:   48   Temp:      TempSrc:      Resp:      Height:      Weight: 58.4 kg (128 lb 12 oz)     SpO2:  100%  84%   Weight change: -1.2 kg (-2 lb 10.3 oz)  Intake/Output Summary (Last 24 hours) at 01/25/16 1106 Last data filed at 01/25/16 0900  Gross per 24 hour  Intake    720 ml  Output   3300 ml  Net  -2580 ml   Physical Exam  Constitutional: She is oriented to person, place, and time. No distress.  Cardiovascular: Normal rate, regular rhythm and normal heart sounds.   Pulmonary/Chest: Effort normal. No respiratory distress.  Lower inspiratory rhonci heard bilaterally  Musculoskeletal: She exhibits no edema.  Neurological: She is alert and oriented to person, place, and time.  Skin: Skin is warm and dry.    Lab Results: @LABTEST2 @ Micro Results: Recent Results (from the past 240 hour(s))  Blood culture (routine x 2)     Status: None (Preliminary result)   Collection Time: 01/20/16 10:25 PM  Result Value Ref Range Status   Specimen Description BLOOD RIGHT ANTECUBITAL  Final   Special Requests BOTTLES DRAWN AEROBIC AND ANAEROBIC 5CC  Final   Culture NO GROWTH 4 DAYS  Final   Report Status PENDING  Incomplete  Blood culture (routine x 2)     Status: None (Preliminary result)   Collection Time: 01/20/16 10:30 PM  Result Value Ref Range Status   Specimen Description BLOOD RIGHT HAND  Final   Special Requests BOTTLES DRAWN  AEROBIC AND ANAEROBIC 5CC  Final   Culture NO GROWTH 4 DAYS  Final   Report Status PENDING  Incomplete  Respiratory virus panel     Status: Abnormal   Collection Time: 01/21/16  5:56 AM  Result Value Ref Range Status   Respiratory Syncytial Virus A Positive (A) Negative Final   Respiratory Syncytial Virus B Negative Negative Final   Influenza A Negative Negative Final   Influenza B Negative Negative Final   Parainfluenza 1 Negative Negative Final   Parainfluenza 2 Negative Negative Final   Parainfluenza 3 Negative Negative Final   Metapneumovirus Negative Negative Final   Rhinovirus Negative Negative Final   Adenovirus Negative Negative Final    Comment: (NOTE) Performed At: [facility] [place], [place] [id number] Lindon Romp MD [id number]    Studies/Results: No results found. Medications:  Scheduled Meds: . apixaban  2.5 mg Oral BID  . carvedilol  6.25 mg Oral BID WC  . cefTAZidime (FORTAZ)  IV  1 g Intravenous Daily  . feeding supplement  1 Container Oral TID BM  . ferrous sulfate  325 mg Oral Q breakfast  . fluticasone  2 spray Each Nare Daily  . furosemide  40 mg Intravenous Once  . furosemide  80 mg Oral  Daily  . gabapentin  600 mg Oral TID  . guaiFENesin  600 mg Oral BID  . guaiFENesin-dextromethorphan  5 mL Oral Q4H  . ipratropium-albuterol  3 mL Nebulization QID  . [START ON 01/26/2016] lisinopril  10 mg Oral Daily  . simvastatin  5 mg Oral q1800   Continuous Infusions:  PRN Meds:.acetaminophen, albuterol Assessment/Plan: Principal Problem:   COPD exacerbation (HCC) Active Problems:   Normocytic anemia   Atrial fibrillation, chronic (HCC)   Moderate to severe pulmonary hypertension, PA pressure in 70s June 2013   Non-ischemic cardiomyopathy EF improved from 30-35% to 40-45% by Echo in 07/2012   LBBB (left bundle branch block)   HTN (hypertension)   Chronic renal impairment, stage 3 (moderate)   Chronic systolic heart failure  (HCC)   Acute on chronic systolic heart failure (HCC)   Respiratory syncytial virus (RSV) infection   Angela Franco is an 82 year old female with a past medical history of suspected COPD, afib, HTN, CHF, Left bundle branch block, nonischemic cardiomyopathy, and hyperlipidemia presenting with SOB, cough, chest congestion, and fevers a 3 day duration. This is hospital day 4 with some clinical improvement in the past 48 hours. We are continuing supportive care for her RSV pneumonia. In terms of diuresis management for her CHF, she put out 2L yesterday and had an episode of hypotension likely 2/2 to volume loss and the mild vasodilatory effect of IV Lasix. We plan on making adjustments to her diuretic regimen but still have a ways to go, especially since we have not noted any increases in her BUN/Cr in the past 2 days while getting diuresis.   RSV pneumonia : Slight improvement overall still coughing w/some associated sputum production (no hx of smoking, known environmental/ allergen exposure.  -Continue supportive care for RSV infection. -Prednisone and Ceftazidime last dose will be today -Robitussin and Mucinex for cough suppression -Started flonase nasal spray 2 sprays per nare for sinus congestion -Duonebs 4x daily now -Outpatient PFT's for further clarification of lung pathology   CHF Exacerbation: Given most recent events of hypotension 2/2 to IV Lasix dose. We will decrease her Lasix dose to 40mg  IV and give her Lasix 80mg  PO dose later in the day. Continue to watch blood pressures. -Coreg 6.25 mg BID -Lisinopril 10mg  daily, starting today -Strict I/Os, today (723mL and 2.75L out, net down 2L) -Daily weights -Pending TTE to assess for pul HTN   Chronic Atrial fibrillation: Rate controlled on home regimen -Eliquis 2.5mg  daily -Coreg 6.25mg  BID  HTN: Well controlled at 131/67 most recently this morning. -Coreg 6.25 mg BID -Lisinopril 5mg  daily, now increasing to 10mg  daily  Insomnia:  Patient is on Restoril(Temazepam) which is an intermediate acting benzodiazepine being given for her insomnia. This medication has been discontinued at this time and I recommend not restarting this medication per the [place] Criteria it is not recommended for insomnia treatment in patients over the age of 45. If she still has issues with sleep a non-benzo hypnotic would be a better choice, Sonata(Zaleplon) 5mg  starting dose given 1 hr prior to the time the patient wants to sleep would be best since it has a rapid onset of action and would be indicated for sleep initiation.  DVT/PE ppx: Eliquis   Dispo: Disposition is deferred at this time, awaiting improvement of current medical problems. Anticipated discharge in approximately 1-2 day(s).   The patient does have a current PCP Marylynn Pearson, MD) and does not need an [facility] hospital follow-up appointment after  discharge.  The patient does have transportation limitations that hinder transportation to clinic appointments.  This is a Careers information officer Note.  The care of the patient was discussed  and the assessment and plan formulated with their assistance.  Please see their attached note for official documentation of the daily encounter.   LOS: 4 days   Delena Serve., Med Student 01/25/2016, 11:06 AM

## 2016-01-25 NOTE — Progress Notes (Signed)
Spoke with Dr. Merrilyn Puma regarding patient's coreg.  Coreg held today because patient's blood pressure and heart rate had been low.  However, during the course of the day the patient's blood pressure and heart rate have been trending up.  Her heart rate has been in the 70's and 80's while she's resting but has gone up as high as 140 bpm with activity.  Her blood pressure is in the 130's/60's.  Dr. Merrilyn Puma advised to go ahead and give the Coreg dose tonight and continue to monitor.

## 2016-01-25 NOTE — Progress Notes (Signed)
Subjective:  Patient had a transient episode of symptomatic hypotension yesterday evening after evening dose of Carvedilol  Her BP and symptoms normalized with time. She reports slight improvement from yesterday, though she still has cough productive of yellow sputum.  Her breathing is improved, though she is still fatigued overall.  Objective: Vital signs in last 24 hours: Filed Vitals:   01/24/16 1903 01/24/16 2019 01/25/16 0400 01/25/16 0405  BP: 86/54 107/64 128/56   Pulse: 60 76 62   Temp:  98 F (36.7 C) 97.9 F (36.6 C)   TempSrc:  Oral Oral   Resp:  18 18   Height:      Weight:    128 lb 12 oz (58.4 kg)  SpO2:  97% 99%    Weight change: -2 lb 10.3 oz (-1.2 kg)  Intake/Output Summary (Last 24 hours) at 01/25/16 [id number] Last data filed at 01/25/16 0146  Gross per 24 hour  Intake    720 ml  Output   2750 ml  Net  -2030 ml   General: Vital signs reviewed. Patient is elderly, sitting in chair and cooperative with exam.  Cardiovascular: Irregularly irregular. Heart sounds distant.  Pulmonary/Chest: Diffuse inspiratory high pitched breath sounds, diffuse expiratory wheezes and scattered rhonchi.  Minimal crackles in bilateral bases today.   Extremities: Trace pitting edema in lower extremities bilaterally Neuro: Fatigued.  Lab Results: Basic Metabolic Panel:  Recent Labs Lab 01/24/16 0403 01/25/16 0324  NA 133* 137  K 3.3* 4.0  CL 87* 88*  CO2 36* 39*  GLUCOSE 108* 97  BUN 42* 42*  CREATININE 1.38* 1.36*  CALCIUM 8.9 9.0   Liver Function Tests:  Recent Labs Lab 01/20/16 2240  AST 29  ALT 17  ALKPHOS 78  BILITOT 0.6  PROT 8.4*  ALBUMIN 3.2*   CBC:  Recent Labs Lab 01/20/16 2240  WBC 6.1  NEUTROABS 4.9  HGB 10.3*  HCT 32.4*  MCV 96.1  PLT 172   Coagulation:  Recent Labs Lab 01/21/16 0116  LABPROT 19.4*  INR 1.63*   Urine Drug Screen: Drugs of Abuse     Component Value Date/Time   LABOPIA POSITIVE* 08/24/2012 1119   COCAINSCRNUR  NONE DETECTED 08/24/2012 1119   LABBENZ NONE DETECTED 08/24/2012 1119   AMPHETMU NONE DETECTED 08/24/2012 1119   THCU NONE DETECTED 08/24/2012 1119   LABBARB NONE DETECTED 08/24/2012 1119    Urinalysis:  Recent Labs Lab 01/21/16 0631  COLORURINE YELLOW  LABSPEC 1.015  PHURINE 5.5  GLUCOSEU NEGATIVE  HGBUR TRACE*  BILIRUBINUR NEGATIVE  KETONESUR NEGATIVE  PROTEINUR NEGATIVE  NITRITE NEGATIVE  LEUKOCYTESUR NEGATIVE   Micro Results: Recent Results (from the past 240 hour(s))  Blood culture (routine x 2)     Status: None (Preliminary result)   Collection Time: 01/20/16 10:25 PM  Result Value Ref Range Status   Specimen Description BLOOD RIGHT ANTECUBITAL  Final   Special Requests BOTTLES DRAWN AEROBIC AND ANAEROBIC 5CC  Final   Culture NO GROWTH 4 DAYS  Final   Report Status PENDING  Incomplete  Blood culture (routine x 2)     Status: None (Preliminary result)   Collection Time: 01/20/16 10:30 PM  Result Value Ref Range Status   Specimen Description BLOOD RIGHT HAND  Final   Special Requests BOTTLES DRAWN AEROBIC AND ANAEROBIC 5CC  Final   Culture NO GROWTH 4 DAYS  Final   Report Status PENDING  Incomplete  Respiratory virus panel     Status: Abnormal  Collection Time: 01/21/16  5:56 AM  Result Value Ref Range Status   Respiratory Syncytial Virus A Positive (A) Negative Final   Respiratory Syncytial Virus B Negative Negative Final   Influenza A Negative Negative Final   Influenza B Negative Negative Final   Parainfluenza 1 Negative Negative Final   Parainfluenza 2 Negative Negative Final   Parainfluenza 3 Negative Negative Final   Metapneumovirus Negative Negative Final   Rhinovirus Negative Negative Final   Adenovirus Negative Negative Final    Comment: (NOTE) Performed At: [facility] [address] [place], [place] [id number] Lindon Romp MD [id number]    Medications:  I have reviewed the patient's current medications. Prior to Admission:    Prescriptions prior to admission  Medication Sig Dispense Refill Last Dose  . apixaban (ELIQUIS) 2.5 MG TABS tablet Take 1 tablet (2.5 mg total) by mouth 2 (two) times daily. 60 tablet 11 01/20/2016 at Unknown time  . calcium-vitamin D (OSCAL WITH D) 500-200 MG-UNIT tablet Take 1 tablet by mouth daily with breakfast.   01/20/2016 at Unknown time  . carvedilol (COREG) 6.25 MG tablet Take 1 tablet (6.25 mg total) by mouth 2 (two) times daily with a meal. 60 tablet 3 01/20/2016 at 2000  . docusate sodium 100 MG CAPS Take 200 mg by mouth at bedtime as needed. (Patient taking differently: Take 100 mg by mouth at bedtime as needed (constipation). ) 60 capsule 0 Past Month at Unknown time  . ferrous sulfate 325 (65 FE) MG tablet Take 325 mg by mouth daily with breakfast.   01/20/2016 at Unknown time  . furosemide (LASIX) 80 MG tablet Take 1 tablet (80 mg total) by mouth daily. 30 tablet 11 01/20/2016 at Unknown time  . gabapentin (NEURONTIN) 600 MG tablet Take 1 tablet (600 mg total) by mouth 3 (three) times daily. Please dose at 8am, 2pm and 8pm 270 tablet 3 01/20/2016 at Unknown time  . guaifenesin (ROBITUSSIN) 100 MG/5ML syrup Take 400 mg by mouth 3 (three) times daily as needed for cough.   01/20/2016 at Unknown time  . ipratropium-albuterol (DUONEB) 0.5-2.5 (3) MG/3ML SOLN Take 3 mLs by nebulization every 6 (six) hours as needed. (Patient taking differently: Take 3 mLs by nebulization every 6 (six) hours as needed (shortness of breath). ) 360 mL 12 01/20/2016 at Unknown time  . levofloxacin (LEVAQUIN) 500 MG tablet Take 500 mg by mouth daily.   01/20/2016 at Unknown time  . potassium chloride SA (K-DUR,KLOR-CON) 20 MEQ tablet Take 20 mEq by mouth daily.   01/20/2016 at Unknown time  . predniSONE (DELTASONE) 10 MG tablet Take 40 mg by mouth daily with breakfast.   01/20/2016 at Unknown time  . Probiotic Product (ALIGN PO) Take 1 tablet by mouth daily.   01/20/2016 at Unknown time  . simvastatin (ZOCOR) 10 MG  tablet Take 0.5 tablets (5 mg total) by mouth daily at 6 PM. 45 tablet 3 01/20/2016 at Unknown time  . temazepam (RESTORIL) 15 MG capsule Take 15 mg by mouth at bedtime.    01/20/2016 at Unknown time   Scheduled Meds: . apixaban  2.5 mg Oral BID  . carvedilol  6.25 mg Oral BID WC  . cefTAZidime (FORTAZ)  IV  1 g Intravenous Daily  . feeding supplement  1 Container Oral TID BM  . ferrous sulfate  325 mg Oral Q breakfast  . furosemide  80 mg Oral Daily  . gabapentin  600 mg Oral TID  . guaiFENesin  600 mg  Oral BID  . guaiFENesin-dextromethorphan  5 mL Oral Q4H  . ipratropium-albuterol  3 mL Nebulization QID  . lisinopril  5 mg Oral Daily  . predniSONE  40 mg Oral Q breakfast  . simvastatin  5 mg Oral q1800   Continuous Infusions:  PRN Meds:.acetaminophen, albuterol Assessment/Plan: Principal Problem:   COPD exacerbation (HCC) Active Problems:   Normocytic anemia   Atrial fibrillation, chronic (HCC)   Moderate to severe pulmonary hypertension, PA pressure in 70s June 2013   Non-ischemic cardiomyopathy EF improved from 30-35% to 40-45% by Echo in 07/2012   LBBB (left bundle branch block)   HTN (hypertension)   Chronic renal impairment, stage 3 (moderate)   Chronic systolic heart failure (HCC)   Acute on chronic systolic heart failure (HCC)   Respiratory syncytial virus (RSV) infection  Angela Franco is a 82 year old female with a past medical history of COPD, A. fib, hypertension, CHF, left bundle branch block, nonischemic cardiomyopathy, and hyperlipidemia presenting with a hospital with a complaint of shortness of breath, cough, chest congestion, and fevers for 3 days.    Combined CHF Exacerbation 2/2 RSV PNA: Echo from April 2016 showing ejection fraction of 40-45%, mild aortic regurgitation, and mild mitral regurgitation with D shaped septum concerning for pulmonary HTN 2/2 CHF.  Acute CHF exacerbation likely 2/2 RSV PNA, so we will continue symptomatic management of the RSV and volume  control for CHF.  She was not tachypneic this morning, but breath sounds are far from clear. Creatinine has been overall stable at Cr 1.4, at baseline.  We will continue extra dose of Lasix IV for further diuresis, as her labs do not demonstrate uptrending BUN.    -Lasix 80 mg PO daily - Single dose Lasix 40 mg IV once -Coreg 6.25 mg BID -Lisinopril 10 mg daily [ ]  TTE -Strict I/Os -Daily weights  RSV PNA, improving: Patient feels better and appears comfortable today, though she has continued productive cough. She continues to sat well on 2 L of oxygen, but her lungs continue to have diffuse wheezing, high pitched inspiratory sounds, and rhonchi.  We will continue symptomatic management of RSV.  Steroids/Abx will be completed today for her initial presumed COPD exacerbation.  However, without PFTs or COPD risk factors, COPD is felt to be less likely. -Ceftazidime IV 1 g daily (2/17 - 2/21) -Prednisone 40 mg daily (2/17 >> 2/21) -Robitussin-DM Q4H -Mucinex 600 mg BID -Duonebs QID -Blood cultures 2/16>> NGTD -Outpatient PFTs  Hypokalemia: Potassium 3.3 this morning after diuresis yesterday. -KDur 40 mEq once  Chronic Atrial Fibrillation: Rate controlled on home regimen. -Eliquis 2.5 mg BID -Coreg 6.25 mg BID  Hypertension: Well controlled at 131/67 this morning.  -Coreg 6.25 mg BID -Lisinopril 10 mg daily  Insomnia: Patient was very somnolent yesterday. She received her home medication of Restoril 15 mg QHS and later admitted that she sometimes only takes half.  -Hold Restoril for now -If restarting, would do only 7.5 mg QHS  DVT/PE ppx: Eliquis BID  Dispo: Disposition is deferred at this time, awaiting improvement of current medical problems.  Anticipated discharge in approximately 2-3 day(s).   The patient does have a current PCP Marylynn Pearson, MD) and does not need an [facility] hospital follow-up appointment after discharge.  The patient does not have transportation limitations  that hinder transportation to clinic appointments.   LOS: 4 days   Viviano Simas, MD 01/25/2016 7:02 AM

## 2016-01-25 NOTE — Progress Notes (Signed)
   01/25/16 [id number]  PT Visit Information  Reason Eval/Treat Not Completed Other (comment) (Pt bathing will continue efforts.  )

## 2016-01-25 NOTE — Progress Notes (Signed)
Internal Medicine Attending  Date: 01/25/2016  Patient name: Angela Franco Medical record number: [id number] Date of birth: 10/24/1929 Age: 82 y.o. Gender: female  I saw and evaluated the patient. I reviewed the resident's note by Dr. Lovena Le and I agree with the resident's findings and plans as documented in his progress note.  [name] is slightly subjectively improved today from a pulmonary standpoint. She still complaints of head congestion. Physical exam demonstrates bilateral inspiratory crackles with no squeaks. The echocardiogram is documented as complete although we do not have the report available. We will continue supportive care, diuresis following the BUN and creatinine, and track down the results of the echocardiogram.

## 2016-01-26 ENCOUNTER — Inpatient Hospital Stay (HOSPITAL_COMMUNITY): Payer: Medicare Other

## 2016-01-26 DIAGNOSIS — I5033 Acute on chronic diastolic (congestive) heart failure: Secondary | ICD-10-CM | POA: Insufficient documentation

## 2016-01-26 DIAGNOSIS — R0600 Dyspnea, unspecified: Secondary | ICD-10-CM

## 2016-01-26 LAB — BASIC METABOLIC PANEL WITH GFR
Anion gap: 11 (ref 5–15)
BUN: 42 mg/dL — ABNORMAL HIGH (ref 6–20)
CO2: 39 mmol/L — ABNORMAL HIGH (ref 22–32)
Calcium: 9.4 mg/dL (ref 8.9–10.3)
Chloride: 86 mmol/L — ABNORMAL LOW (ref 101–111)
Creatinine, Ser: 1.36 mg/dL — ABNORMAL HIGH (ref 0.44–1.00)
GFR calc Af Amer: 40 mL/min — ABNORMAL LOW
GFR calc non Af Amer: 35 mL/min — ABNORMAL LOW
Glucose, Bld: 85 mg/dL (ref 65–99)
Potassium: 3.3 mmol/L — ABNORMAL LOW (ref 3.5–5.1)
Sodium: 136 mmol/L (ref 135–145)

## 2016-01-26 MED ORDER — TEMAZEPAM 7.5 MG PO CAPS: 7.5000 mg | ORAL_CAPSULE | Freq: Every evening | ORAL | Status: DC | PRN

## 2016-01-26 MED ORDER — SENNOSIDES-DOCUSATE SODIUM 8.6-50 MG PO TABS: 1.0000 | ORAL_TABLET | Freq: Two times a day (BID) | ORAL | Status: DC

## 2016-01-26 MED ORDER — POTASSIUM CHLORIDE CRYS ER 20 MEQ PO TBCR
40.0000 meq | EXTENDED_RELEASE_TABLET | Freq: Once | ORAL | Status: AC
  Administered 2016-01-26: 40 meq via ORAL
  Filled 2016-01-26: qty 2

## 2016-01-26 MED ORDER — SENNOSIDES-DOCUSATE SODIUM 8.6-50 MG PO TABS
2.0000 | ORAL_TABLET | Freq: Two times a day (BID) | ORAL | Status: DC
  Administered 2016-01-26: 2 via ORAL
  Filled 2016-01-26: qty 2

## 2016-01-26 MED ORDER — BOOST / RESOURCE BREEZE PO LIQD: 1.0000 | Freq: Three times a day (TID) | ORAL | Status: DC

## 2016-01-26 MED ORDER — GUAIFENESIN-DM 100-10 MG/5ML PO SYRP: 5.0000 mL | ORAL_SOLUTION | ORAL | Status: DC

## 2016-01-26 MED ORDER — LISINOPRIL 10 MG PO TABS: 10.0000 mg | ORAL_TABLET | Freq: Every day | ORAL | Status: DC

## 2016-01-26 NOTE — Progress Notes (Signed)
Subjective:  NAEON.  Patient states she feels good and wants to be discharged today. She is breathing comfortably and her cough has improved, though remains productive.  She has not had a BM in 2 days.  Objective: Vital signs in last 24 hours: Filed Vitals:   01/25/16 1904 01/25/16 2026 01/26/16 0256 01/26/16 0448  BP: 133/63 116/50  135/67  Pulse: 85 60  55  Temp:  98.3 F (36.8 C)  97.8 F (36.6 C)  TempSrc:  Oral  Oral  Resp:  18  18  Height:      Weight:   127 lb 8 oz (57.834 kg)   SpO2:  97%  100%   Weight change: -1 lb 4 oz (-0.566 kg)  Intake/Output Summary (Last 24 hours) at 01/26/16 0845 Last data filed at 01/25/16 2028  Gross per 24 hour  Intake    960 ml  Output   5250 ml  Net  -4290 ml   General: Vital signs reviewed. Patient is elderly, sitting in chair and cooperative with exam.  Cardiovascular: Irregularly irregular. Heart sounds distant.  Pulmonary/Chest: Diffuse rhonchi with forced expiration. No crackles appreciated. Extremities: No peripheral edema in lower extremities bilaterally Neuro: Alert, happy, asking to be discharged.  Lab Results: Basic Metabolic Panel:  Recent Labs Lab 01/24/16 0403 01/25/16 0324  NA 133* 137  K 3.3* 4.0  CL 87* 88*  CO2 36* 39*  GLUCOSE 108* 97  BUN 42* 42*  CREATININE 1.38* 1.36*  CALCIUM 8.9 9.0   Liver Function Tests:  Recent Labs Lab 01/20/16 2240  AST 29  ALT 17  ALKPHOS 78  BILITOT 0.6  PROT 8.4*  ALBUMIN 3.2*   CBC:  Recent Labs Lab 01/20/16 2240  WBC 6.1  NEUTROABS 4.9  HGB 10.3*  HCT 32.4*  MCV 96.1  PLT 172   Coagulation:  Recent Labs Lab 01/21/16 0116  LABPROT 19.4*  INR 1.63*   Urine Drug Screen: Drugs of Abuse     Component Value Date/Time   LABOPIA POSITIVE* 08/24/2012 1119   COCAINSCRNUR NONE DETECTED 08/24/2012 1119   LABBENZ NONE DETECTED 08/24/2012 1119   AMPHETMU NONE DETECTED 08/24/2012 1119   THCU NONE DETECTED 08/24/2012 1119   LABBARB NONE DETECTED  08/24/2012 1119    Urinalysis:  Recent Labs Lab 01/21/16 0631  COLORURINE YELLOW  LABSPEC 1.015  PHURINE 5.5  GLUCOSEU NEGATIVE  HGBUR TRACE*  BILIRUBINUR NEGATIVE  KETONESUR NEGATIVE  PROTEINUR NEGATIVE  NITRITE NEGATIVE  LEUKOCYTESUR NEGATIVE   Micro Results: Recent Results (from the past 240 hour(s))  Blood culture (routine x 2)     Status: None   Collection Time: 01/20/16 10:25 PM  Result Value Ref Range Status   Specimen Description BLOOD RIGHT ANTECUBITAL  Final   Special Requests BOTTLES DRAWN AEROBIC AND ANAEROBIC 5CC  Final   Culture NO GROWTH 5 DAYS  Final   Report Status 01/25/2016 FINAL  Final  Blood culture (routine x 2)     Status: None   Collection Time: 01/20/16 10:30 PM  Result Value Ref Range Status   Specimen Description BLOOD RIGHT HAND  Final   Special Requests BOTTLES DRAWN AEROBIC AND ANAEROBIC 5CC  Final   Culture NO GROWTH 5 DAYS  Final   Report Status 01/25/2016 FINAL  Final  Respiratory virus panel     Status: Abnormal   Collection Time: 01/21/16  5:56 AM  Result Value Ref Range Status   Respiratory Syncytial Virus A Positive (A) Negative Final  Respiratory Syncytial Virus B Negative Negative Final   Influenza A Negative Negative Final   Influenza B Negative Negative Final   Parainfluenza 1 Negative Negative Final   Parainfluenza 2 Negative Negative Final   Parainfluenza 3 Negative Negative Final   Metapneumovirus Negative Negative Final   Rhinovirus Negative Negative Final   Adenovirus Negative Negative Final    Comment: (NOTE) Performed At: [facility] [address] [place], [place] [id number] Lindon Romp MD [id number]    Medications:  I have reviewed the patient's current medications. Prior to Admission:  Prescriptions prior to admission  Medication Sig Dispense Refill Last Dose  . apixaban (ELIQUIS) 2.5 MG TABS tablet Take 1 tablet (2.5 mg total) by mouth 2 (two) times daily. 60 tablet 11 01/20/2016 at Unknown  time  . calcium-vitamin D (OSCAL WITH D) 500-200 MG-UNIT tablet Take 1 tablet by mouth daily with breakfast.   01/20/2016 at Unknown time  . carvedilol (COREG) 6.25 MG tablet Take 1 tablet (6.25 mg total) by mouth 2 (two) times daily with a meal. 60 tablet 3 01/20/2016 at 2000  . docusate sodium 100 MG CAPS Take 200 mg by mouth at bedtime as needed. (Patient taking differently: Take 100 mg by mouth at bedtime as needed (constipation). ) 60 capsule 0 Past Month at Unknown time  . ferrous sulfate 325 (65 FE) MG tablet Take 325 mg by mouth daily with breakfast.   01/20/2016 at Unknown time  . furosemide (LASIX) 80 MG tablet Take 1 tablet (80 mg total) by mouth daily. 30 tablet 11 01/20/2016 at Unknown time  . gabapentin (NEURONTIN) 600 MG tablet Take 1 tablet (600 mg total) by mouth 3 (three) times daily. Please dose at 8am, 2pm and 8pm 270 tablet 3 01/20/2016 at Unknown time  . guaifenesin (ROBITUSSIN) 100 MG/5ML syrup Take 400 mg by mouth 3 (three) times daily as needed for cough.   01/20/2016 at Unknown time  . ipratropium-albuterol (DUONEB) 0.5-2.5 (3) MG/3ML SOLN Take 3 mLs by nebulization every 6 (six) hours as needed. (Patient taking differently: Take 3 mLs by nebulization every 6 (six) hours as needed (shortness of breath). ) 360 mL 12 01/20/2016 at Unknown time  . levofloxacin (LEVAQUIN) 500 MG tablet Take 500 mg by mouth daily.   01/20/2016 at Unknown time  . potassium chloride SA (K-DUR,KLOR-CON) 20 MEQ tablet Take 20 mEq by mouth daily.   01/20/2016 at Unknown time  . predniSONE (DELTASONE) 10 MG tablet Take 40 mg by mouth daily with breakfast.   01/20/2016 at Unknown time  . Probiotic Product (ALIGN PO) Take 1 tablet by mouth daily.   01/20/2016 at Unknown time  . simvastatin (ZOCOR) 10 MG tablet Take 0.5 tablets (5 mg total) by mouth daily at 6 PM. 45 tablet 3 01/20/2016 at Unknown time  . temazepam (RESTORIL) 15 MG capsule Take 15 mg by mouth at bedtime.    01/20/2016 at Unknown time   Scheduled  Meds: . apixaban  2.5 mg Oral BID  . carvedilol  6.25 mg Oral BID WC  . feeding supplement  1 Container Oral TID BM  . ferrous sulfate  325 mg Oral Q breakfast  . furosemide  80 mg Oral Daily  . gabapentin  600 mg Oral TID  . guaiFENesin  600 mg Oral BID  . guaiFENesin-dextromethorphan  5 mL Oral Q4H  . ipratropium-albuterol  3 mL Nebulization TID  . lisinopril  10 mg Oral Daily  . senna-docusate  2 tablet Oral BID  .  simvastatin  5 mg Oral q1800   Continuous Infusions:  PRN Meds:.acetaminophen, albuterol Assessment/Plan: Principal Problem:   COPD exacerbation (HCC) Active Problems:   Normocytic anemia   Atrial fibrillation, chronic (HCC)   Moderate to severe pulmonary hypertension, PA pressure in 70s June 2013   Non-ischemic cardiomyopathy EF improved from 30-35% to 40-45% by Echo in 07/2012   LBBB (left bundle branch block)   HTN (hypertension)   Chronic renal impairment, stage 3 (moderate)   Chronic systolic heart failure (HCC)   Acute on chronic systolic heart failure (HCC)   Respiratory syncytial virus (RSV) infection  Ms. Gevorkyan is a 82 year old female with a past medical history of COPD, A. fib, hypertension, CHF, left bundle branch block, nonischemic cardiomyopathy, and hyperlipidemia presenting with a hospital with a complaint of shortness of breath, cough, chest congestion, and fevers for 3 days.    Combined CHF Exacerbation 2/2 RSV PNA: Echo from April 2016 showing ejection fraction of 40-45%, mild aortic regurgitation, and mild mitral regurgitation with D shaped septum concerning for pulmonary HTN 2/2 CHF.  Acute CHF exacerbation likely 2/2 RSV PNA, so we will continue symptomatic management of the RSV and volume control for CHF.  She was not tachypneic this morning, but breath sounds are far from clear. Creatinine has been overall stable at Cr 1.4, at baseline.  Patient feels great and desires to be discharged to SNF. -Lasix 80 mg PO daily -Coreg 6.25 mg  BID -Lisinopril 10 mg daily [ ]  TTE -Strict I/Os -Daily weights  RSV PNA, improving: Patient feels better and appears comfortable today, though she has continued productive cough. She continues to sat well on 2 L of oxygen, but her lungs continue to have diffuse wheezing, high pitched inspiratory sounds, and rhonchi.  We will continue symptomatic management of RSV.  Steroids/Abx will be completed today for her initial presumed COPD exacerbation.  However, without PFTs or COPD risk factors, COPD is felt to be less likely. -Ceftazidime IV 1 g daily (2/17 - 2/21) -Prednisone 40 mg daily (2/17 >> 2/21) -Robitussin-DM Q4H -Mucinex 600 mg BID -Duonebs QID -Blood cultures 2/16>> NGTD -Outpatient PFTs  Hypokalemia: Potassium 3.3 this morning after diuresis yesterday. -KDur 40 mEq once  Chronic Atrial Fibrillation: Rate controlled on home regimen. -Eliquis 2.5 mg BID -Coreg 6.25 mg BID  Hypertension: Well controlled at 131/67 this morning.  -Coreg 6.25 mg BID -Lisinopril 10 mg daily  Insomnia: Patient was very somnolent yesterday. She received her home medication of Restoril 15 mg QHS and later admitted that she sometimes only takes half.  -Hold Restoril for now -If restarting, would do only 7.5 mg QHS  DVT/PE ppx: Eliquis BID  Dispo: Disposition is deferred at this time, awaiting improvement of current medical problems.  Anticipated discharge in approximately 2-3 day(s).   The patient does have a current PCP Marylynn Pearson, MD) and does not need an [facility] hospital follow-up appointment after discharge.  The patient does not have transportation limitations that hinder transportation to clinic appointments.   LOS: 5 days   Viviano Simas, MD 01/26/2016 8:45 AM

## 2016-01-26 NOTE — Progress Notes (Signed)
Patient ambulated sat only to low 90's and got as high as 100%

## 2016-01-26 NOTE — Progress Notes (Signed)
Pt. Discharged with daughter to [facility]  Report called and given to Deneise Lever the receiving RN Discharge information reviewed and given All personal belongings given to Pt.  Education discussed IV was d/c and intact upon removal Tele d/c CCMD notified

## 2016-01-26 NOTE — Progress Notes (Signed)
Internal Medicine Attending  Date: 01/26/2016  Patient name: Angela Franco Medical record number: [id number] Date of birth: 08/01/1929 Age: 82 y.o. Gender: female  I saw and evaluated the patient. I reviewed the resident's note by Dr. Lovena Le and I agree with the resident's findings and plans as documented in his progress note.  Ms. Caplan feels much improved today from a breathing standpoint. The echocardiogram was completed and shows no systolic dysfunction at this time. I therefore believe she had an RSV infection which resulted in decompensation of her chronic diastolic heart failure (unable to prove diastolic dysfunction on Echo secondary to a-fib). This responded to supportive therapy and diuresis. I do not believe she has a diagnosis of chronic obstructive pulmonary disease given her lack of risk factors. I agree with her desire to be discharged home today with follow-up in her primary care provider's office.

## 2016-01-26 NOTE — Care Management Note (Signed)
Case Management Note Marvetta Gibbons RN, BSN Unit 2W-Case Manager [phone]  Patient Details  Name: Angela Franco MRN: [id number] Date of Birth: 1928-12-23  Subjective/Objective:    Pt  Admitted with COPD              Action/Plan: PTA pt lived at [place]- was active with [facility] for [place] for disease management- will need resumption order for [facility] when ready for discharge- CM to follow for d/c needs---1450- per conversation with pt and daughter- Angela Franco plan is for pt to go to [facility] SNF- family has made arrangements with Claiborne Billings there prior to admission- CSW has been consulted to assist with this-   Expected Discharge Date:   01/26/16               Expected Discharge Plan:  Skilled Facility  In-House Referral:  Clinical Social WorkSocial work  Discharge planning Services  CM Consult  Post Acute Care Choice:   Choice offered to:     DME Arranged:    DME Agency:     HH Arranged:    [place] Agency:  [facility]  Status of Service:  Completed, signed off  Medicare Important Message Given:  Yes Date Medicare IM Given:    Medicare IM give by:    Date Additional Medicare IM Given:    Additional Medicare Important Message give by:     If discussed at [place] of Stay Meetings, dates discussed:    Discharge Disposition: skilled facility   Additional Comments:  Dawayne Patricia, RN 01/26/2016, 12:01 PM

## 2016-01-26 NOTE — Progress Notes (Signed)
Patient will discharge to [facility] Anticipated discharge date:2/22 Family notified: pt dtr at bedside Transportation by pt dtr- facility request that they don't leave the hospital until 2:30pm Report #: [phone]  CSW signing off.  Domenica Reamer, [place] Social Worker [phone]

## 2016-01-26 NOTE — Progress Notes (Signed)
  Echocardiogram 2D Echocardiogram has been performed.  Darlina Sicilian M 01/26/2016, 11:30 AM

## 2016-02-16 ENCOUNTER — Ambulatory Visit (INDEPENDENT_AMBULATORY_CARE_PROVIDER_SITE_OTHER): Payer: Medicare Other | Admitting: Internal Medicine

## 2016-02-16 ENCOUNTER — Encounter: Payer: Self-pay | Admitting: Internal Medicine

## 2016-02-16 VITALS — BP 108/60 | HR 78 | Ht 63.0 in | Wt 131.0 lb

## 2016-02-16 DIAGNOSIS — I447 Left bundle-branch block, unspecified: Secondary | ICD-10-CM

## 2016-02-16 DIAGNOSIS — I5033 Acute on chronic diastolic (congestive) heart failure: Secondary | ICD-10-CM

## 2016-02-16 DIAGNOSIS — I10 Essential (primary) hypertension: Secondary | ICD-10-CM | POA: Diagnosis not present

## 2016-02-16 DIAGNOSIS — N183 Chronic kidney disease, stage 3 unspecified: Secondary | ICD-10-CM

## 2016-02-16 NOTE — Patient Instructions (Signed)
Your physician wants you to follow-up in: 6 months with Dr. Hilty. You will receive a reminder letter in the mail two months in advance. If you don't receive a letter, please call our office to schedule the follow-up appointment.    

## 2016-02-16 NOTE — Progress Notes (Signed)
OFFICE NOTE  Chief Complaint:  Follow-up hospitalization  Primary Care Physician: Marylynn Pearson, MD  HPI:  Angela Franco is an 82 year old female who was recently hospitalized for hyponatremia and atrial fibrillation. She had a cardiac catheterization last summer that showed an EF of 30% to 35%; however, this improved to 40% to 45% with a repeat echocardiogram. She also has a chronic left bundle branch block. She has had problems with encephalopathy and was on Tegretol in the past. These symptoms have improved; however, still has persistent atrial fibrillation. In addition, during her hospitalization, it was complicated by anemia and was found to be iron deficient. She also had a GI workup, which did not reveal any bleeding source. She was taken off of warfarin, which she was taking at the time, but was never restarted. Overall, she seems much better and in fact is not complaining of nearly as many problems since her hospitalization. It is also important to remember that she had a trigeminal neuralgia as well that stopped.   At her last office visit I recommended starting Eliquis which she has been taking without any bleeding problems. She is maintaining atrial fibrillation today which is probably permanent A. fib at this point. She denies any worsening shortness of breath or chest pain.  I saw Angela Franco back in the office today. She seems to be doing fairly well. She denies any worsening shortness of breath, weight gain or swelling. She is tolerating Eliquis without any bleeding, occasions. She takes Lasix 3 times a week. She was recently told by her primary care provider that she does have some mild chronic kidney disease. This needs to be reassessed. She was also supposed to have an echocardiogram prior to this visit as her last EF was 40-45% in 2013.  Angela Franco returns today for follow-up. She is experiencing some increased shortness of breath and weight gain. Her weights up about 7  pounds since her last office visit. She's noted some worsening leg swelling and even some swelling in her face. She's had an ongoing lower extremity wound problem which she is getting care at the wound care center. She is also dealing with trigeminal neuralgia which is quite painful. She reports some worsening shortness of breath and orthopnea. She is needed to use oxygen at night the last few evenings.  I saw Angela Franco today back in the office. Unfortunately she was hospitalized in January for influenza. She did have the vaccine although was sick for several weeks. She was actually hospitalized twice. She was treated with Tamiflu and then continued to worsen. Fortunately she is recovered. She actually lost about 25 pounds. Some of that may have been with increased diuresis as I increased her Lasix back in November, however her weight now is back up to 133 pounds from 125 pounds.  she says she feels much better finally. Blood pressure is well-controlled. She denies any worsening shortness of breath or lower extremity edema. She was maintained on Eliquis and is not having any bleeding problems.   Angela Franco returns today for follow-up. Unfortunately she was hospitalized again for recurrent pulmonary infection. This time she developed an RSV pneumonia, which is her second pneumonia in 3 months. Fortunately she responded to therapy and seems to be doing fairly well today. There was also mild heart failure exacerbation associated with it but she appears euvolemic and back to baseline today.  PMHx:  Past Medical History  Diagnosis Date  . COPD (chronic obstructive pulmonary disease) ([place])  Never smoked. Questionable diagnosis.  . Atrial fibrillation ([place])   . Osteopenia   . Insomnia   . Hyponatremia   . Hypertension   . UTI (lower urinary tract infection)   . Bursitis of hip   . Trigeminal neuralgia   . Altered mental status 08/24/2012  . Anemia, with significant drop in H/H 08/24/2012  . CHF  (congestive heart failure) ([place])   . Heart disease   . Acute facial pain   . LBBB (left bundle branch block)   . Nonischemic cardiomyopathy ([place])   . Dyslipidemia   . Kidney failure     Stage 3    Past Surgical History  Procedure Laterality Date  . Appendectomy    . Tonsillectomy and adenoidectomy    . Cardiac catheterization  07/04/2012    normal coronaries, EF 30-35%, elevated R & L heart pressures, reduced CO, mild pulm HTN (Dr. K. Mali Dellia Donnelly)  . Transthoracic echocardiogram  2013    mild conc LVH, RV mildly dilated; LA severely dilated; RA mod dilated; mild-mod mitral annular calcif, calcif of anterior & posterior MV leaflet, mod MR; mod-severe TR with RSVP 30-67mmHg; mild calcif of AV leaflets and mod aortic regurg; mild pulm valve regurg; pericardial effusion (right)  . Nm myocar perf wall motion  2011    lexiscan myoview - normal LV systolic function w/normal wall motion, no evidence of scar/ischemia  . Left and right heart catheterization with coronary angiogram N/A 07/04/2012    Procedure: LEFT AND RIGHT HEART CATHETERIZATION WITH CORONARY ANGIOGRAM;  Surgeon: Pixie Casino, MD;  Location: [facility] CATH LAB;  Service: Cardiovascular;  Laterality: N/A;    FAMHx:  Family History  Problem Relation Age of Onset  . Stroke Father   . Heart attack Father   . Heart attack Brother   . Heart attack Brother   . Alzheimer's disease Mother     SOCHx:   reports that she has never smoked. She has never used smokeless tobacco. She reports that she does not drink alcohol or use illicit drugs.  ALLERGIES:  Allergies  Allergen Reactions  . Penicillins Hives    Has patient had a PCN reaction causing immediate rash, facial/tongue/throat swelling, SOB or lightheadedness with hypotension: No Has patient had a PCN reaction causing severe rash involving mucus membranes or skin necrosis: No Has patient had a PCN reaction that required hospitalization No Has patient had a PCN reaction occurring  within the last 10 years: No If all of the above answers are "NO", then may proceed with Cephalosporin use.    ROS: Pertinent items noted in HPI and remainder of comprehensive ROS otherwise negative.  HOME MEDS: Current Outpatient Prescriptions  Medication Sig Dispense Refill  . apixaban (ELIQUIS) 2.5 MG TABS tablet Take 1 tablet (2.5 mg total) by mouth 2 (two) times daily. 60 tablet 11  . calcium-vitamin D (OSCAL WITH D) 500-200 MG-UNIT tablet Take 1 tablet by mouth daily with breakfast.    . carvedilol (COREG) 6.25 MG tablet Take 1 tablet (6.25 mg total) by mouth 2 (two) times daily with a meal. 60 tablet 3  . docusate sodium 100 MG CAPS Take 200 mg by mouth at bedtime as needed. (Patient taking differently: Take 100 mg by mouth at bedtime as needed (constipation). ) 60 capsule 0  . feeding supplement (BOOST / RESOURCE BREEZE) LIQD Take 1 Container by mouth 3 (three) times daily between meals. 237 mL 0  . ferrous sulfate 325 (65 FE) MG tablet Take 325 mg  by mouth daily with breakfast.    . furosemide (LASIX) 80 MG tablet Take 1 tablet (80 mg total) by mouth daily. 30 tablet 11  . gabapentin (NEURONTIN) 600 MG tablet Take 1 tablet (600 mg total) by mouth 3 (three) times daily. Please dose at 8am, 2pm and 8pm 270 tablet 3  . guaiFENesin-dextromethorphan (ROBITUSSIN DM) 100-10 MG/5ML syrup Take 5 mLs by mouth every 4 (four) hours. 118 mL 0  . ipratropium-albuterol (DUONEB) 0.5-2.5 (3) MG/3ML SOLN Take 3 mLs by nebulization every 6 (six) hours as needed. (Patient taking differently: Take 3 mLs by nebulization every 6 (six) hours as needed (shortness of breath). ) 360 mL 12  . lisinopril (PRINIVIL,ZESTRIL) 10 MG tablet Take 1 tablet (10 mg total) by mouth daily. 30 tablet 3  . Melatonin 10 MG TABS Take 10 mg by mouth daily.    . OXcarbazepine (TRILEPTAL) 150 MG tablet Take 150 mg by mouth. TAKE 1/2 TABLET IN THE MORNING, NOON, AND AT NIGHT.    [name] potassium chloride SA (K-DUR,KLOR-CON) 20 MEQ  tablet Take 20 mEq by mouth daily.    . Probiotic Product (ALIGN PO) Take 1 tablet by mouth daily.    [name] senna-docusate (SENOKOT-S) 8.6-50 MG tablet Take 1 tablet by mouth 2 (two) times daily. 60 tablet 0  . simvastatin (ZOCOR) 10 MG tablet Take 0.5 tablets (5 mg total) by mouth daily at 6 PM. 45 tablet 3   No current facility-administered medications for this visit.    LABS/IMAGING: No results found for this or any previous visit (from the past 48 hour(s)). No results found.  VITALS: BP 108/60 mmHg  Pulse 78  Ht 5\' 3"  (1.6 m)  Wt 131 lb (59.421 kg)  BMI 23.21 kg/m2  EXAM: General appearance: alert and no distress Neck: JVD - 9 cm above sternal notch and no carotid bruit Lungs: diminished breath sounds bilaterally and rales bibasilar Heart: irregularly irregular rhythm Abdomen: soft, non-tender; bowel sounds normal; no masses,  no organomegaly Extremities: edema 1+ pitting bilaterally and venous stasis dermatitis noted Pulses: 2+ and symmetric Skin: Skin color, texture, turgor normal. No rashes or lesions Neurologic: Grossly normal Psych: Mood, affect normal  EKG: Deferred   ASSESSMENT: 1. Permanent atrial fibrillation - on Eliquis 2. Left bundle Branch block 3. Ischemic cardiomyopathy EF 40-45% (improved to 55% on recent echo) 4. Dyslipidemia 5. Acute on chronic systolic/diastolic congestive heart failure  6. CKD 4 7. COPD with recurrent viral pneumonia  PLAN: 1.   Mrs. Lovins was just recently seen and then readmitted for a recurrent viral pneumonia. She tells me that she did not have a pneumonia vaccine however I find that hard to believe that she was not vaccinated in the hospital. I would highly recommend that she have this through her primary care provider if it was not administered. I'm pleased to see that her EF appears to have improved on echo up to 55%. Although she has had recurrent diastolic heart failure. She appears euvolemic today on her current medicines. I  have not made any changes to her medicines.  Plan follow-up in 6 months.   Pixie Casino, MD, [facility] Attending Cardiologist [place] [facility] Ahlani Wickes 02/16/2016, 6:32 PM

## 2016-02-18 ENCOUNTER — Ambulatory Visit (INDEPENDENT_AMBULATORY_CARE_PROVIDER_SITE_OTHER): Payer: Medicare Other | Admitting: Physician Assistant

## 2016-02-18 VITALS — BP 102/60 | HR 79 | Temp 98.5°F | Resp 16 | Ht 63.0 in | Wt 133.0 lb

## 2016-02-18 DIAGNOSIS — Z0289 Encounter for other administrative examinations: Secondary | ICD-10-CM | POA: Diagnosis not present

## 2016-02-18 NOTE — Addendum Note (Signed)
Addended by: Ivar Drape D on: 02/18/2016 08:51 PM   Modules accepted: Level of Service

## 2016-02-18 NOTE — Progress Notes (Signed)
[facility] [address] [place] [address] [phone]  Date:  02/18/2016   Name:  Angela Franco   DOB:  29-Jan-1929   MRN:  [id number]  PCP:  Marylynn Pearson, MD   Chief Complaint  Patient presents with  . Pt need form filled out, moving into long term care   History of Present Illness:  Angela Franco is a 82 y.o. female patient who presents to [facility] for a form completion.   She is moving to the [facility].  Former living place is [place].  [facility] was an independent living apartment, but the [facility] has continuing care community.  She was formerly treated by Dr. Dillard Essex of [facility].  She denies cough, sob, cp, leg swelling or dizziness. No hx of cognitive or dementia impairment. She denies urinary and fecal incontinence.  She usually ambulates without assistance, until she is outdoors where she may use her walker, or has assistance by individual she is with. She generally self-administers her medications.  Her daughter separates her medications into weekly dosing.  Hospitalization recent for RSV pneumonia about 3 weeks ago.  Her symptoms have completely resolved.     Patient Active Problem List   Diagnosis Date Noted  . Acute on chronic diastolic heart failure ([place])   . Acute on chronic systolic heart failure ([place])   . Respiratory syncytial virus (RSV) infection   . Chronic systolic heart failure ([place])   . COPD exacerbation ([place]) 01/21/2016  . Chronic renal impairment, stage 3 (moderate) 11/10/2015  . COPD by CXR 08/26/2012  . Non-ischemic cardiomyopathy EF improved from 30-35% to 40-45% by Echo in 07/2012 06/30/2012  . LBBB (left bundle branch block) 06/30/2012  . Chronic anticoagulation 06/30/2012  . HTN (hypertension) 06/30/2012  . Systolic and diastolic CHF, acute on chronic,  06/29/2012  . Hypo-osmolality and hyponatremia 06/29/2012  . Normocytic anemia 06/29/2012  . Atrial fibrillation, chronic ([place]) 06/29/2012  .  Trigeminal neuralgia 06/29/2012  . Moderate to severe pulmonary hypertension, PA pressure in 70s June 2013 06/29/2012    Past Medical History  Diagnosis Date  . COPD (chronic obstructive pulmonary disease) ([place])     Never smoked. Questionable diagnosis.  . Atrial fibrillation ([place])   . Osteopenia   . Insomnia   . Hyponatremia   . Hypertension   . UTI (lower urinary tract infection)   . Bursitis of hip   . Trigeminal neuralgia   . Altered mental status 08/24/2012  . Anemia, with significant drop in H/H 08/24/2012  . CHF (congestive heart failure) ([place])   . Heart disease   . Acute facial pain   . LBBB (left bundle branch block)   . Nonischemic cardiomyopathy ([place])   . Dyslipidemia   . Kidney failure     Stage 3    Past Surgical History  Procedure Laterality Date  . Appendectomy    . Tonsillectomy and adenoidectomy    . Cardiac catheterization  07/04/2012    normal coronaries, EF 30-35%, elevated R & L heart pressures, reduced CO, mild pulm HTN (Dr. K. Mali Hilty)  . Transthoracic echocardiogram  2013    mild conc LVH, RV mildly dilated; LA severely dilated; RA mod dilated; mild-mod mitral annular calcif, calcif of anterior & posterior MV leaflet, mod MR; mod-severe TR with RSVP 30-52mmHg; mild calcif of AV leaflets and mod aortic regurg; mild pulm valve regurg; pericardial effusion (right)  . Nm myocar perf wall motion  2011  lexiscan myoview - normal LV systolic function w/normal wall motion, no evidence of scar/ischemia  . Left and right heart catheterization with coronary angiogram N/A 07/04/2012    Procedure: LEFT AND RIGHT HEART CATHETERIZATION WITH CORONARY ANGIOGRAM;  Surgeon: Pixie Casino, MD;  Location: [facility] CATH LAB;  Service: Cardiovascular;  Laterality: N/A;    Social History  Substance Use Topics  . Smoking status: Never Smoker   . Smokeless tobacco: Never Used  . Alcohol Use: No    Family History  Problem Relation Age of Onset  . Stroke Father   . Heart  attack Father   . Heart attack Brother   . Heart attack Brother   . Alzheimer's disease Mother     Allergies  Allergen Reactions  . Penicillins Hives    Has patient had a PCN reaction causing immediate rash, facial/tongue/throat swelling, SOB or lightheadedness with hypotension: No Has patient had a PCN reaction causing severe rash involving mucus membranes or skin necrosis: No Has patient had a PCN reaction that required hospitalization No Has patient had a PCN reaction occurring within the last 10 years: No If all of the above answers are "NO", then may proceed with Cephalosporin use.    Medication list has been reviewed and updated.  Current Outpatient Prescriptions on File Prior to Visit  Medication Sig Dispense Refill  . apixaban (ELIQUIS) 2.5 MG TABS tablet Take 1 tablet (2.5 mg total) by mouth 2 (two) times daily. 60 tablet 11  . calcium-vitamin D (OSCAL WITH D) 500-200 MG-UNIT tablet Take 1 tablet by mouth daily with breakfast.    . carvedilol (COREG) 6.25 MG tablet Take 1 tablet (6.25 mg total) by mouth 2 (two) times daily with a meal. 60 tablet 3  . feeding supplement (BOOST / RESOURCE BREEZE) LIQD Take 1 Container by mouth 3 (three) times daily between meals. 237 mL 0  . ferrous sulfate 325 (65 FE) MG tablet Take 325 mg by mouth daily with breakfast.    . furosemide (LASIX) 80 MG tablet Take 1 tablet (80 mg total) by mouth daily. 30 tablet 11  . gabapentin (NEURONTIN) 600 MG tablet Take 1 tablet (600 mg total) by mouth 3 (three) times daily. Please dose at 8am, 2pm and 8pm 270 tablet 3  . lisinopril (PRINIVIL,ZESTRIL) 10 MG tablet Take 1 tablet (10 mg total) by mouth daily. 30 tablet 3  . Melatonin 10 MG TABS Take 10 mg by mouth daily.    . OXcarbazepine (TRILEPTAL) 150 MG tablet Take 150 mg by mouth. TAKE 1/2 TABLET IN THE MORNING, NOON, AND AT NIGHT.    [name] potassium chloride SA (K-DUR,KLOR-CON) 20 MEQ tablet Take 20 mEq by mouth daily.    . Probiotic Product (ALIGN PO) Take  1 tablet by mouth daily.    [name] senna-docusate (SENOKOT-S) 8.6-50 MG tablet Take 1 tablet by mouth 2 (two) times daily. 60 tablet 0  . simvastatin (ZOCOR) 10 MG tablet Take 0.5 tablets (5 mg total) by mouth daily at 6 PM. 45 tablet 3  . docusate sodium 100 MG CAPS Take 200 mg by mouth at bedtime as needed. (Patient not taking: Reported on 02/18/2016) 60 capsule 0  . guaiFENesin-dextromethorphan (ROBITUSSIN DM) 100-10 MG/5ML syrup Take 5 mLs by mouth every 4 (four) hours. (Patient not taking: Reported on 02/18/2016) 118 mL 0  . ipratropium-albuterol (DUONEB) 0.5-2.5 (3) MG/3ML SOLN Take 3 mLs by nebulization every 6 (six) hours as needed. (Patient not taking: Reported on 02/18/2016) 360 mL 12  No current facility-administered medications on file prior to visit.    ROS ROS otherwise unremarkable unless listed above.   Physical Examination: BP 102/60 mmHg  Pulse 79  Temp(Src) 98.5 F (36.9 C) (Oral)  Resp 16  Ht 5\' 3"  (1.6 m)  Wt 133 lb (60.328 kg)  BMI 23.57 kg/m2  SpO2 94% Ideal Body Weight: Weight in (lb) to have BMI = 25: 140.8  Physical Exam  Constitutional: She is oriented to person, place, and time. She appears well-developed and well-nourished. No distress.  HENT:  Head: Normocephalic and atraumatic.  Right Ear: Tympanic membrane, external ear and ear canal normal.  Left Ear: Tympanic membrane, external ear and ear canal normal.  Nose: Right sinus exhibits no maxillary sinus tenderness and no frontal sinus tenderness. Left sinus exhibits no maxillary sinus tenderness and no frontal sinus tenderness.  Mouth/Throat: Oropharynx is clear and moist. No uvula swelling. No oropharyngeal exudate, posterior oropharyngeal edema or posterior oropharyngeal erythema.  Eyes: Conjunctivae and EOM are normal. Pupils are equal, round, and reactive to light.  Neck: Normal range of motion. Neck supple. No thyromegaly present.  Cardiovascular: Normal rate, regular rhythm, normal heart sounds and  intact distal pulses.  Exam reveals no gallop, no distant heart sounds and no friction rub.   No murmur heard. Pulmonary/Chest: Effort normal and breath sounds normal. No respiratory distress. She has no decreased breath sounds. She has no wheezes. She has no rhonchi.  Abdominal: Soft. Bowel sounds are normal. She exhibits no distension and no mass. There is no tenderness.  Musculoskeletal: Normal range of motion. She exhibits no edema or tenderness.  Lymphadenopathy:       Head (right side): No submandibular, no tonsillar, no preauricular and no posterior auricular adenopathy present.       Head (left side): No submandibular, no tonsillar, no preauricular and no posterior auricular adenopathy present.    She has no cervical adenopathy.  Neurological: She is alert and oriented to person, place, and time. No cranial nerve deficit. She exhibits normal muscle tone. Coordination (minimal shuffling though apparent) normal.  Skin: Skin is warm and dry. She is not diaphoretic.  Psychiatric: She has a normal mood and affect. Her speech is normal and behavior is normal. Judgment and thought content normal. Cognition and memory are normal.     Assessment and Plan: Fidela Uhlenhake is a 82 y.o. female who is here today for form completion.  Encounter for completion of form with patient  Ivar Drape, PA-C [facility] 3/17/20172:46 PM

## 2016-02-18 NOTE — Patient Instructions (Signed)
Contact me if you would like these immunizations, and the TB test at this time.

## 2016-02-22 ENCOUNTER — Ambulatory Visit (INDEPENDENT_AMBULATORY_CARE_PROVIDER_SITE_OTHER): Payer: Medicare Other | Admitting: Physician Assistant

## 2016-02-22 ENCOUNTER — Ambulatory Visit (INDEPENDENT_AMBULATORY_CARE_PROVIDER_SITE_OTHER): Payer: Medicare Other

## 2016-02-22 VITALS — BP 110/52 | HR 91 | Temp 100.4°F | Ht 62.5 in | Wt 131.4 lb

## 2016-02-22 DIAGNOSIS — R6889 Other general symptoms and signs: Secondary | ICD-10-CM

## 2016-02-22 DIAGNOSIS — J988 Other specified respiratory disorders: Secondary | ICD-10-CM | POA: Diagnosis not present

## 2016-02-22 DIAGNOSIS — R509 Fever, unspecified: Secondary | ICD-10-CM

## 2016-02-22 LAB — POCT CBC
Granulocyte percent: 45.6 %G (ref 37–80)
HCT, POC: 30.8 % — AB (ref 37.7–47.9)
Hemoglobin: 10.5 g/dL — AB (ref 12.2–16.2)
LYMPH, POC: 2.4 (ref 0.6–3.4)
MCH, POC: 32.4 pg — AB (ref 27–31.2)
MCHC: 34.1 g/dL (ref 31.8–35.4)
MCV: 95 fL (ref 80–97)
MID (CBC): 0.7 (ref 0–0.9)
MPV: 5.9 fL (ref 0–99.8)
PLATELET COUNT, POC: 253 10*3/uL (ref 142–424)
POC Granulocyte: 2.6 (ref 2–6.9)
POC LYMPH %: 41.4 % (ref 10–50)
POC MID %: 13 %M — AB (ref 0–12)
RBC: 3.24 M/uL — AB (ref 4.04–5.48)
RDW, POC: 16.2 %
WBC: 5.7 10*3/uL (ref 4.6–10.2)

## 2016-02-22 LAB — POCT INFLUENZA A/B
INFLUENZA A, POC: NEGATIVE
INFLUENZA B, POC: NEGATIVE

## 2016-02-22 MED ORDER — GUAIFENESIN ER 1200 MG PO TB12: 1.0000 | ORAL_TABLET | Freq: Two times a day (BID) | ORAL | Status: DC | PRN

## 2016-02-22 MED ORDER — AZITHROMYCIN 250 MG PO TABS: ORAL_TABLET | ORAL | Status: DC

## 2016-02-22 NOTE — Progress Notes (Signed)
[facility] [address], [place] [address] [phone]  Date:  02/22/2016   Name:  Angela Franco   DOB:  08/02/1929   MRN:  [id number]  PCP:  No PCP Per Patient   Chief Complaint  Patient presents with  . Fever  . Cough    hx of pneumonia    History of Present Illness:  Angela Franco is a 82 y.o. female patient who presents to [facility] for chief complaint of fever and cough. She is here today with her daughter. Patient has complained of 2-3 days of coughing and fever. She states the cough is productive but she swallows it. She denies any shortness of breath or trouble breathing. She has a lot of fatigue, malaise, body aches, and chills. There is no dizziness associated. Patient has a recent history within the last 4 weeks of RSV pneumonia, and influenza at resulted in hospitalization. Patient was also seen here 5 days ago for general form completion without sickness complaint but did spend an extended time in the waiting room.     Patient Active Problem List   Diagnosis Date Noted  . Acute on chronic diastolic heart failure ([place])   . Acute on chronic systolic heart failure ([place])   . Respiratory syncytial virus (RSV) infection   . Chronic systolic heart failure ([place])   . COPD exacerbation ([place]) 01/21/2016  . Chronic renal impairment, stage 3 (moderate) 11/10/2015  . COPD by CXR 08/26/2012  . Non-ischemic cardiomyopathy EF improved from 30-35% to 40-45% by Echo in 07/2012 06/30/2012  . LBBB (left bundle branch block) 06/30/2012  . Chronic anticoagulation 06/30/2012  . HTN (hypertension) 06/30/2012  . Systolic and diastolic CHF, acute on chronic,  06/29/2012  . Hypo-osmolality and hyponatremia 06/29/2012  . Normocytic anemia 06/29/2012  . Atrial fibrillation, chronic ([place]) 06/29/2012  . Trigeminal neuralgia 06/29/2012  . Moderate to severe pulmonary hypertension, PA pressure in 70s June 2013 06/29/2012    Past Medical History  Diagnosis Date  . COPD  (chronic obstructive pulmonary disease) ([place])     Never smoked. Questionable diagnosis.  . Atrial fibrillation ([place])   . Osteopenia   . Insomnia   . Hyponatremia   . Hypertension   . UTI (lower urinary tract infection)   . Bursitis of hip   . Trigeminal neuralgia   . Altered mental status 08/24/2012  . Anemia, with significant drop in H/H 08/24/2012  . CHF (congestive heart failure) ([place])   . Heart disease   . Acute facial pain   . LBBB (left bundle branch block)   . Nonischemic cardiomyopathy ([place])   . Dyslipidemia   . Kidney failure     Stage 3    Past Surgical History  Procedure Laterality Date  . Appendectomy    . Tonsillectomy and adenoidectomy    . Cardiac catheterization  07/04/2012    normal coronaries, EF 30-35%, elevated R & L heart pressures, reduced CO, mild pulm HTN (Dr. K. Mali Hilty)  . Transthoracic echocardiogram  2013    mild conc LVH, RV mildly dilated; LA severely dilated; RA mod dilated; mild-mod mitral annular calcif, calcif of anterior & posterior MV leaflet, mod MR; mod-severe TR with RSVP 30-59mmHg; mild calcif of AV leaflets and mod aortic regurg; mild pulm valve regurg; pericardial effusion (right)  . Nm myocar perf wall motion  2011    lexiscan myoview - normal LV systolic function w/normal wall motion, no evidence of scar/ischemia  . Left and right  heart catheterization with coronary angiogram N/A 07/04/2012    Procedure: LEFT AND RIGHT HEART CATHETERIZATION WITH CORONARY ANGIOGRAM;  Surgeon: Pixie Casino, MD;  Location: [facility] CATH LAB;  Service: Cardiovascular;  Laterality: N/A;    Social History  Substance Use Topics  . Smoking status: Never Smoker   . Smokeless tobacco: Never Used  . Alcohol Use: No    Family History  Problem Relation Age of Onset  . Stroke Father   . Heart attack Father   . Heart attack Brother   . Heart attack Brother   . Alzheimer's disease Mother     Allergies  Allergen Reactions  . Penicillins Hives    Has patient  had a PCN reaction causing immediate rash, facial/tongue/throat swelling, SOB or lightheadedness with hypotension: No Has patient had a PCN reaction causing severe rash involving mucus membranes or skin necrosis: No Has patient had a PCN reaction that required hospitalization No Has patient had a PCN reaction occurring within the last 10 years: No If all of the above answers are "NO", then may proceed with Cephalosporin use.    Medication list has been reviewed and updated.  Current Outpatient Prescriptions on File Prior to Visit  Medication Sig Dispense Refill  . apixaban (ELIQUIS) 2.5 MG TABS tablet Take 1 tablet (2.5 mg total) by mouth 2 (two) times daily. 60 tablet 11  . calcium-vitamin D (OSCAL WITH D) 500-200 MG-UNIT tablet Take 1 tablet by mouth daily with breakfast.    . carvedilol (COREG) 6.25 MG tablet Take 1 tablet (6.25 mg total) by mouth 2 (two) times daily with a meal. 60 tablet 3  . feeding supplement (BOOST / RESOURCE BREEZE) LIQD Take 1 Container by mouth 3 (three) times daily between meals. 237 mL 0  . ferrous sulfate 325 (65 FE) MG tablet Take 325 mg by mouth daily with breakfast.    . furosemide (LASIX) 80 MG tablet Take 1 tablet (80 mg total) by mouth daily. 30 tablet 11  . gabapentin (NEURONTIN) 600 MG tablet Take 1 tablet (600 mg total) by mouth 3 (three) times daily. Please dose at 8am, 2pm and 8pm 270 tablet 3  . guaiFENesin-dextromethorphan (ROBITUSSIN DM) 100-10 MG/5ML syrup Take 5 mLs by mouth every 4 (four) hours. 118 mL 0  . lisinopril (PRINIVIL,ZESTRIL) 10 MG tablet Take 1 tablet (10 mg total) by mouth daily. 30 tablet 3  . Melatonin 10 MG TABS Take 10 mg by mouth daily.    . OXcarbazepine (TRILEPTAL) 150 MG tablet Take 150 mg by mouth. TAKE 1/2 TABLET IN THE MORNING, NOON, AND AT NIGHT.    [name] potassium chloride SA (K-DUR,KLOR-CON) 20 MEQ tablet Take 20 mEq by mouth daily.    . Probiotic Product (ALIGN PO) Take 1 tablet by mouth daily.    [name] senna-docusate  (SENOKOT-S) 8.6-50 MG tablet Take 1 tablet by mouth 2 (two) times daily. 60 tablet 0  . simvastatin (ZOCOR) 10 MG tablet Take 0.5 tablets (5 mg total) by mouth daily at 6 PM. 45 tablet 3  . docusate sodium 100 MG CAPS Take 200 mg by mouth at bedtime as needed. (Patient not taking: Reported on 02/18/2016) 60 capsule 0  . ipratropium-albuterol (DUONEB) 0.5-2.5 (3) MG/3ML SOLN Take 3 mLs by nebulization every 6 (six) hours as needed. (Patient not taking: Reported on 02/18/2016) 360 mL 12   No current facility-administered medications on file prior to visit.    ROS ROS otherwise unremarkable unless listed above.  Physical Examination: BP 110/52 mmHg  Pulse 91  Temp(Src) 100.4 F (38 C) (Oral)  Ht 5' 2.5" (1.588 m)  Wt 131 lb 6.4 oz (59.603 kg)  BMI 23.64 kg/m2  SpO2 93% Ideal Body Weight: Weight in (lb) to have BMI = 25: 138.6  Physical Exam  Constitutional: She is oriented to person, place, and time. She appears well-developed and well-nourished. No distress.  HENT:  Head: Normocephalic and atraumatic.  Right Ear: Tympanic membrane, external ear and ear canal normal.  Left Ear: Tympanic membrane, external ear and ear canal normal.  Nose: Mucosal edema and rhinorrhea present. Right sinus exhibits no maxillary sinus tenderness and no frontal sinus tenderness. Left sinus exhibits no maxillary sinus tenderness and no frontal sinus tenderness.  Mouth/Throat: No uvula swelling. No oropharyngeal exudate, posterior oropharyngeal edema or posterior oropharyngeal erythema.  Eyes: Conjunctivae and EOM are normal. Pupils are equal, round, and reactive to light.  Cardiovascular: Normal rate and regular rhythm.  Exam reveals no gallop, no distant heart sounds and no friction rub.   No murmur heard. Pulmonary/Chest: Effort normal. No respiratory distress. She has no decreased breath sounds. She has no wheezes. She has no rhonchi.  Lymphadenopathy:       Head (right side): No submandibular, no  tonsillar, no preauricular and no posterior auricular adenopathy present.       Head (left side): No submandibular, no tonsillar, no preauricular and no posterior auricular adenopathy present.    She has no cervical adenopathy.  Neurological: She is alert and oriented to person, place, and time.  Skin: She is not diaphoretic.  Psychiatric: She has a normal mood and affect. Her behavior is normal.   Results for orders placed or performed in visit on 02/22/16  POCT Influenza A/B  Result Value Ref Range   Influenza A, POC Negative Negative   Influenza B, POC Negative Negative  POCT CBC  Result Value Ref Range   WBC 5.7 4.6 - 10.2 K/uL   Lymph, poc 2.4 0.6 - 3.4   POC LYMPH PERCENT 41.4 10 - 50 %L   MID (cbc) 0.7 0 - 0.9   POC MID % 13.0 (A) 0 - 12 %M   POC Granulocyte 2.6 2 - 6.9   Granulocyte percent 45.6 37 - 80 %G   RBC 3.24 (A) 4.04 - 5.48 M/uL   Hemoglobin 10.5 (A) 12.2 - 16.2 g/dL   HCT, POC 30.8 (A) 37.7 - 47.9 %   MCV 95.0 80 - 97 fL   MCH, POC 32.4 (A) 27 - 31.2 pg   MCHC 34.1 31.8 - 35.4 g/dL   RDW, POC 16.2 %   Platelet Count, POC 253 142 - 424 K/uL   MPV 5.9 0 - 99.8 fL   Dg Chest 2 View  02/22/2016  CLINICAL DATA:  Productive cough, body aches, and fever. EXAM: CHEST  2 VIEW COMPARISON:  None. FINDINGS: The cardiac silhouette is mildly to moderately enlarged. Thoracic aortic atherosclerosis is noted. The lungs are hyperinflated. There is mild biapical scarring. No confluent airspace opacity, edema, pleural effusion, or pneumothorax is identified. No acute osseous abnormality is seen. IMPRESSION: Cardiomegaly and hyperinflation without evidence of acute cardiopulmonary process. Electronically Signed   By: Logan Bores M.D.   On: 02/22/2016 18:22    Assessment and Plan: Maire Rzepecki is a 82 y.o. female who is here today for 2-3 days of cough and fever. Patient's flu was negative. This could likely be viral, however, with recent history and significant decline, we should  treat at this  time. Advised up to her maximum limit of hydration.  Respiratory infection  Flu-like symptoms - Plan: POCT Influenza A/B, POCT CBC, DG Chest 2 View, azithromycin (ZITHROMAX) 250 MG tablet, Guaifenesin (MUCINEX MAXIMUM STRENGTH) 1200 MG TB12  Fever, unspecified fever cause - Plan: POCT CBC  Ivar Drape, PA-C [facility] 02/22/2016 5:02 PM

## 2016-02-22 NOTE — Patient Instructions (Signed)
Please hydrate with the assigned measurement of water. Take the medication to completion.  You can do over-the-counter Delsym for cough. Do tylenol or ibuprofen for fever. Please let me know if your symptoms worsen.

## 2016-02-23 ENCOUNTER — Other Ambulatory Visit: Payer: Self-pay | Admitting: *Deleted

## 2016-02-23 MED ORDER — LISINOPRIL 10 MG PO TABS: 10.0000 mg | ORAL_TABLET | Freq: Every day | ORAL | Status: DC

## 2016-02-28 ENCOUNTER — Emergency Department (HOSPITAL_COMMUNITY): Payer: Medicare Other

## 2016-02-28 ENCOUNTER — Encounter (HOSPITAL_COMMUNITY): Payer: Self-pay | Admitting: *Deleted

## 2016-02-28 ENCOUNTER — Inpatient Hospital Stay (HOSPITAL_COMMUNITY)
Admission: EM | Admit: 2016-02-28 | Discharge: 2016-03-02 | DRG: 189 | Payer: Medicare Other | Attending: Family Medicine | Admitting: Family Medicine

## 2016-02-28 ENCOUNTER — Telehealth: Payer: Self-pay | Admitting: General Practice

## 2016-02-28 DIAGNOSIS — J9601 Acute respiratory failure with hypoxia: Secondary | ICD-10-CM | POA: Diagnosis not present

## 2016-02-28 DIAGNOSIS — M8580 Other specified disorders of bone density and structure, unspecified site: Secondary | ICD-10-CM | POA: Diagnosis present

## 2016-02-28 DIAGNOSIS — Z9049 Acquired absence of other specified parts of digestive tract: Secondary | ICD-10-CM

## 2016-02-28 DIAGNOSIS — Z9841 Cataract extraction status, right eye: Secondary | ICD-10-CM

## 2016-02-28 DIAGNOSIS — I10 Essential (primary) hypertension: Secondary | ICD-10-CM | POA: Diagnosis present

## 2016-02-28 DIAGNOSIS — Z880 Allergy status to penicillin: Secondary | ICD-10-CM

## 2016-02-28 DIAGNOSIS — N179 Acute kidney failure, unspecified: Secondary | ICD-10-CM | POA: Diagnosis present

## 2016-02-28 DIAGNOSIS — I482 Chronic atrial fibrillation, unspecified: Secondary | ICD-10-CM | POA: Diagnosis present

## 2016-02-28 DIAGNOSIS — J9621 Acute and chronic respiratory failure with hypoxia: Principal | ICD-10-CM | POA: Diagnosis present

## 2016-02-28 DIAGNOSIS — Z9842 Cataract extraction status, left eye: Secondary | ICD-10-CM

## 2016-02-28 DIAGNOSIS — I447 Left bundle-branch block, unspecified: Secondary | ICD-10-CM | POA: Diagnosis present

## 2016-02-28 DIAGNOSIS — I509 Heart failure, unspecified: Secondary | ICD-10-CM | POA: Diagnosis present

## 2016-02-28 DIAGNOSIS — N183 Chronic kidney disease, stage 3 unspecified: Secondary | ICD-10-CM | POA: Diagnosis present

## 2016-02-28 DIAGNOSIS — I255 Ischemic cardiomyopathy: Secondary | ICD-10-CM | POA: Diagnosis present

## 2016-02-28 DIAGNOSIS — I272 Other secondary pulmonary hypertension: Secondary | ICD-10-CM | POA: Diagnosis present

## 2016-02-28 DIAGNOSIS — G4700 Insomnia, unspecified: Secondary | ICD-10-CM | POA: Diagnosis present

## 2016-02-28 DIAGNOSIS — I428 Other cardiomyopathies: Secondary | ICD-10-CM

## 2016-02-28 DIAGNOSIS — D649 Anemia, unspecified: Secondary | ICD-10-CM | POA: Diagnosis present

## 2016-02-28 DIAGNOSIS — Z9889 Other specified postprocedural states: Secondary | ICD-10-CM

## 2016-02-28 DIAGNOSIS — Z8249 Family history of ischemic heart disease and other diseases of the circulatory system: Secondary | ICD-10-CM

## 2016-02-28 DIAGNOSIS — E871 Hypo-osmolality and hyponatremia: Secondary | ICD-10-CM | POA: Diagnosis present

## 2016-02-28 DIAGNOSIS — I130 Hypertensive heart and chronic kidney disease with heart failure and stage 1 through stage 4 chronic kidney disease, or unspecified chronic kidney disease: Secondary | ICD-10-CM | POA: Diagnosis present

## 2016-02-28 DIAGNOSIS — J441 Chronic obstructive pulmonary disease with (acute) exacerbation: Secondary | ICD-10-CM | POA: Diagnosis not present

## 2016-02-28 DIAGNOSIS — Z8589 Personal history of malignant neoplasm of other organs and systems: Secondary | ICD-10-CM

## 2016-02-28 DIAGNOSIS — G500 Trigeminal neuralgia: Secondary | ICD-10-CM | POA: Diagnosis present

## 2016-02-28 DIAGNOSIS — Z961 Presence of intraocular lens: Secondary | ICD-10-CM | POA: Diagnosis present

## 2016-02-28 DIAGNOSIS — D509 Iron deficiency anemia, unspecified: Secondary | ICD-10-CM | POA: Diagnosis present

## 2016-02-28 DIAGNOSIS — Z820 Family history of epilepsy and other diseases of the nervous system: Secondary | ICD-10-CM

## 2016-02-28 DIAGNOSIS — Z79899 Other long term (current) drug therapy: Secondary | ICD-10-CM

## 2016-02-28 DIAGNOSIS — Z66 Do not resuscitate: Secondary | ICD-10-CM | POA: Diagnosis present

## 2016-02-28 DIAGNOSIS — J440 Chronic obstructive pulmonary disease with acute lower respiratory infection: Secondary | ICD-10-CM | POA: Diagnosis present

## 2016-02-28 DIAGNOSIS — Z823 Family history of stroke: Secondary | ICD-10-CM

## 2016-02-28 DIAGNOSIS — J1008 Influenza due to other identified influenza virus with other specified pneumonia: Secondary | ICD-10-CM | POA: Diagnosis present

## 2016-02-28 DIAGNOSIS — Z9981 Dependence on supplemental oxygen: Secondary | ICD-10-CM

## 2016-02-28 HISTORY — DX: Dependence on supplemental oxygen: Z99.81

## 2016-02-28 HISTORY — DX: Other complications of anesthesia, initial encounter: T88.59XA

## 2016-02-28 HISTORY — DX: Adverse effect of unspecified anesthetic, initial encounter: T41.45XA

## 2016-02-28 HISTORY — DX: Unspecified malignant neoplasm of skin of nose: C44.301

## 2016-02-28 HISTORY — DX: Unspecified osteoarthritis, unspecified site: M19.90

## 2016-02-28 LAB — INFLUENZA PANEL BY PCR (TYPE A & B)
H1N1 flu by pcr: NOT DETECTED
Influenza A By PCR: NEGATIVE
Influenza B By PCR: POSITIVE — AB

## 2016-02-28 LAB — CBC WITH DIFFERENTIAL/PLATELET
Basophils Absolute: 0 10*3/uL (ref 0.0–0.1)
Basophils Relative: 0 %
EOS ABS: 0 10*3/uL (ref 0.0–0.7)
EOS PCT: 1 %
HCT: 30.2 % — ABNORMAL LOW (ref 36.0–46.0)
Hemoglobin: 10.1 g/dL — ABNORMAL LOW (ref 12.0–15.0)
LYMPHS ABS: 1.8 10*3/uL (ref 0.7–4.0)
LYMPHS PCT: 42 %
MCH: 32 pg (ref 26.0–34.0)
MCHC: 33.4 g/dL (ref 30.0–36.0)
MCV: 95.6 fL (ref 78.0–100.0)
MONOS PCT: 16 %
Monocytes Absolute: 0.7 10*3/uL (ref 0.1–1.0)
Neutro Abs: 1.8 10*3/uL (ref 1.7–7.7)
Neutrophils Relative %: 41 %
Platelets: 163 10*3/uL (ref 150–400)
RBC: 3.16 MIL/uL — AB (ref 3.87–5.11)
RDW: 15.3 % (ref 11.5–15.5)
WBC: 4.4 10*3/uL (ref 4.0–10.5)

## 2016-02-28 LAB — BASIC METABOLIC PANEL
Anion gap: 10 (ref 5–15)
BUN: 55 mg/dL — ABNORMAL HIGH (ref 6–20)
CHLORIDE: 95 mmol/L — AB (ref 101–111)
CO2: 25 mmol/L (ref 22–32)
Calcium: 8.8 mg/dL — ABNORMAL LOW (ref 8.9–10.3)
Creatinine, Ser: 1.78 mg/dL — ABNORMAL HIGH (ref 0.44–1.00)
GFR calc non Af Amer: 25 mL/min — ABNORMAL LOW (ref >60)
GFR, EST AFRICAN AMERICAN: 29 mL/min — AB (ref >60)
Glucose, Bld: 94 mg/dL (ref 65–99)
POTASSIUM: 4.9 mmol/L (ref 3.5–5.1)
SODIUM: 130 mmol/L — AB (ref 135–145)

## 2016-02-28 LAB — I-STAT TROPONIN, ED: TROPONIN I, POC: 0.01 ng/mL (ref 0.00–0.08)

## 2016-02-28 LAB — BRAIN NATRIURETIC PEPTIDE: B NATRIURETIC PEPTIDE 5: 48.2 pg/mL (ref 0.0–100.0)

## 2016-02-28 LAB — TSH: TSH: 0.619 u[IU]/mL (ref 0.350–4.500)

## 2016-02-28 MED ORDER — SODIUM CHLORIDE 0.9 % IV BOLUS (SEPSIS)
500.0000 mL | Freq: Once | INTRAVENOUS | Status: AC
  Administered 2016-02-28: 500 mL via INTRAVENOUS

## 2016-02-28 MED ORDER — METHYLPREDNISOLONE SODIUM SUCC 125 MG IJ SOLR
60.0000 mg | Freq: Four times a day (QID) | INTRAMUSCULAR | Status: DC
  Administered 2016-02-28 – 2016-02-29 (×4): 60 mg via INTRAVENOUS
  Filled 2016-02-28 (×5): qty 2

## 2016-02-28 MED ORDER — ONDANSETRON HCL 4 MG/2ML IJ SOLN: 4.0000 mg | Freq: Four times a day (QID) | INTRAMUSCULAR | Status: DC | PRN

## 2016-02-28 MED ORDER — CALCIUM CARBONATE-VITAMIN D 500-200 MG-UNIT PO TABS
1.0000 | ORAL_TABLET | Freq: Every day | ORAL | Status: DC
  Administered 2016-02-29 – 2016-03-02 (×3): 1 via ORAL
  Filled 2016-02-28 (×4): qty 1

## 2016-02-28 MED ORDER — GABAPENTIN 300 MG PO CAPS
600.0000 mg | ORAL_CAPSULE | Freq: Three times a day (TID) | ORAL | Status: DC
  Administered 2016-02-28 – 2016-03-02 (×10): 600 mg via ORAL
  Filled 2016-02-28 (×11): qty 2

## 2016-02-28 MED ORDER — ALBUTEROL SULFATE (2.5 MG/3ML) 0.083% IN NEBU: 2.5000 mg | INHALATION_SOLUTION | RESPIRATORY_TRACT | Status: DC | PRN

## 2016-02-28 MED ORDER — ACETAMINOPHEN 650 MG RE SUPP: 650.0000 mg | Freq: Four times a day (QID) | RECTAL | Status: DC | PRN

## 2016-02-28 MED ORDER — ONDANSETRON HCL 4 MG PO TABS: 4.0000 mg | ORAL_TABLET | Freq: Four times a day (QID) | ORAL | Status: DC | PRN

## 2016-02-28 MED ORDER — FERROUS SULFATE 325 (65 FE) MG PO TABS
325.0000 mg | ORAL_TABLET | Freq: Every day | ORAL | Status: DC
  Administered 2016-02-29 – 2016-03-02 (×3): 325 mg via ORAL
  Filled 2016-02-28 (×3): qty 1

## 2016-02-28 MED ORDER — DOXYCYCLINE HYCLATE 100 MG PO TABS
100.0000 mg | ORAL_TABLET | Freq: Two times a day (BID) | ORAL | Status: DC
  Administered 2016-02-28 – 2016-03-02 (×7): 100 mg via ORAL
  Filled 2016-02-28 (×7): qty 1

## 2016-02-28 MED ORDER — ALBUTEROL SULFATE (2.5 MG/3ML) 0.083% IN NEBU
2.5000 mg | INHALATION_SOLUTION | RESPIRATORY_TRACT | Status: DC
  Administered 2016-02-28 (×3): 2.5 mg via RESPIRATORY_TRACT
  Filled 2016-02-28 (×4): qty 3

## 2016-02-28 MED ORDER — APIXABAN 2.5 MG PO TABS
2.5000 mg | ORAL_TABLET | Freq: Two times a day (BID) | ORAL | Status: DC
  Administered 2016-02-28 – 2016-03-02 (×6): 2.5 mg via ORAL
  Filled 2016-02-28 (×7): qty 1

## 2016-02-28 MED ORDER — IPRATROPIUM-ALBUTEROL 0.5-2.5 (3) MG/3ML IN SOLN
3.0000 mL | RESPIRATORY_TRACT | Status: AC
  Administered 2016-02-28 (×3): 3 mL via RESPIRATORY_TRACT
  Filled 2016-02-28: qty 3
  Filled 2016-02-28: qty 6

## 2016-02-28 MED ORDER — GUAIFENESIN ER 600 MG PO TB12
600.0000 mg | ORAL_TABLET | Freq: Two times a day (BID) | ORAL | Status: DC
  Administered 2016-02-28 – 2016-03-02 (×7): 600 mg via ORAL
  Filled 2016-02-28 (×7): qty 1

## 2016-02-28 MED ORDER — SODIUM CHLORIDE 0.9% FLUSH
3.0000 mL | Freq: Two times a day (BID) | INTRAVENOUS | Status: DC
  Administered 2016-02-28 – 2016-03-02 (×5): 3 mL via INTRAVENOUS

## 2016-02-28 MED ORDER — ACETAMINOPHEN 325 MG PO TABS
650.0000 mg | ORAL_TABLET | Freq: Four times a day (QID) | ORAL | Status: DC | PRN
  Administered 2016-03-01 – 2016-03-02 (×2): 650 mg via ORAL
  Filled 2016-02-28 (×2): qty 2

## 2016-02-28 MED ORDER — SIMVASTATIN 10 MG PO TABS
5.0000 mg | ORAL_TABLET | Freq: Every day | ORAL | Status: DC
  Administered 2016-02-28 – 2016-03-01 (×3): 5 mg via ORAL
  Filled 2016-02-28 (×4): qty 1

## 2016-02-28 MED ORDER — IPRATROPIUM-ALBUTEROL 0.5-2.5 (3) MG/3ML IN SOLN: 3.0000 mL | Freq: Once | RESPIRATORY_TRACT | Status: DC

## 2016-02-28 MED ORDER — METHYLPREDNISOLONE SODIUM SUCC 125 MG IJ SOLR
125.0000 mg | Freq: Once | INTRAMUSCULAR | Status: AC
  Administered 2016-02-28: 125 mg via INTRAVENOUS
  Filled 2016-02-28: qty 2

## 2016-02-28 NOTE — ED Notes (Signed)
Doctor at bedside.

## 2016-02-28 NOTE — ED Notes (Signed)
Pt reports recent cough and cold symptoms. Has been to pcp office and tested negative for flu but reports no relief with meds given. Normally wears o2 while sleeping but now having to wear it at all times due to increase in sob.

## 2016-02-28 NOTE — ED Notes (Signed)
ED Doctor removed oxygen pulse oximetry decreased to 89-91%. Placed patient on oxygen 2L [place]. Pulse oximetry increased to 94%.

## 2016-02-28 NOTE — Discharge Planning (Signed)
ERCM consulted to assist with PCP establishment.  ERCM called [place] [place] [facility] PCP to find that they are booked until end of June; however, scheduler will monitor schedule and try to work pt in for a sooner appointment as pt was seen there before.  Scheduler will call pt daughter to arrange appointment date and time.  Information relayed to pt and EDP.

## 2016-02-28 NOTE — ED Notes (Addendum)
Pt made aware of bed assignment 

## 2016-02-28 NOTE — Telephone Encounter (Signed)
Angela Franco, case manager with [facility] called to ask if we could see pt for an establish appointment and follow up from the ED. Pt has cough and congestion, and does not have a primary care. Would like pt to be seen in the next few weeks. Also, the 8 am appointment is too early for this pt. Is it ok to work into your schedule to establish?

## 2016-02-28 NOTE — ED Provider Notes (Signed)
CSN: [id number]     Arrival date & time 02/28/16  [id number] History   First MD Initiated Contact with Patient 02/28/16 1005     Chief Complaint  Patient presents with  . Cough     (Consider location/radiation/quality/duration/timing/severity/associated sxs/prior Treatment) Patient is a 82 y.o. female presenting with shortness of breath. The history is provided by the patient and a relative.  Shortness of Breath Severity:  Moderate Onset quality:  Gradual Duration:  2 days Timing:  Constant Progression:  Worsening Chronicity:  New Context: URI   Relieved by:  Nothing Worsened by:  Nothing tried Ineffective treatments:  None tried Associated symptoms: cough and sputum production (white frothy)   Associated symptoms: no chest pain, no fever and no hemoptysis   Risk factors comment:  Emphysema, pulmonary hypertension, CHF   Past Medical History  Diagnosis Date  . COPD (chronic obstructive pulmonary disease) ([place])     Never smoked. Questionable diagnosis.  . Atrial fibrillation ([place])   . Osteopenia   . Insomnia   . Hyponatremia   . Hypertension   . UTI (lower urinary tract infection)   . Bursitis of hip   . Trigeminal neuralgia   . Altered mental status 08/24/2012  . Anemia, with significant drop in H/H 08/24/2012  . CHF (congestive heart failure) ([place])   . Heart disease   . Acute facial pain   . LBBB (left bundle branch block)   . Nonischemic cardiomyopathy ([place])   . Dyslipidemia   . Kidney failure     Stage 3   Past Surgical History  Procedure Laterality Date  . Appendectomy    . Tonsillectomy and adenoidectomy    . Cardiac catheterization  07/04/2012    normal coronaries, EF 30-35%, elevated R & L heart pressures, reduced CO, mild pulm HTN (Dr. K. Mali Hilty)  . Transthoracic echocardiogram  2013    mild conc LVH, RV mildly dilated; LA severely dilated; RA mod dilated; mild-mod mitral annular calcif, calcif of anterior & posterior MV leaflet, mod MR; mod-severe TR with  RSVP 30-1mmHg; mild calcif of AV leaflets and mod aortic regurg; mild pulm valve regurg; pericardial effusion (right)  . Nm myocar perf wall motion  2011    lexiscan myoview - normal LV systolic function w/normal wall motion, no evidence of scar/ischemia  . Left and right heart catheterization with coronary angiogram N/A 07/04/2012    Procedure: LEFT AND RIGHT HEART CATHETERIZATION WITH CORONARY ANGIOGRAM;  Surgeon: Pixie Casino, MD;  Location: [facility] CATH LAB;  Service: Cardiovascular;  Laterality: N/A;   Family History  Problem Relation Age of Onset  . Stroke Father   . Heart attack Father   . Heart attack Brother   . Heart attack Brother   . Alzheimer's disease Mother    Social History  Substance Use Topics  . Smoking status: Never Smoker   . Smokeless tobacco: Never Used  . Alcohol Use: No   OB History    No data available     Review of Systems  Constitutional: Negative for fever.  Respiratory: Positive for cough, sputum production (white frothy) and shortness of breath. Negative for hemoptysis.   Cardiovascular: Negative for chest pain.  All other systems reviewed and are negative.     Allergies  Penicillins  Home Medications   Prior to Admission medications   Medication Sig Start Date End Date Taking? Authorizing Provider  acetaminophen (TYLENOL) 325 MG tablet Take 650 mg by mouth every 6 (six) hours as needed.  Historical Provider, MD  apixaban (ELIQUIS) 2.5 MG TABS tablet Take 1 tablet (2.5 mg total) by mouth 2 (two) times daily. 11/16/15   Pixie Casino, MD  azithromycin (ZITHROMAX) 250 MG tablet Take 2 tabs PO x 1 dose, then 1 tab PO QD x 4 days 02/22/16   Dorian Heckle English, PA  calcium-vitamin D (OSCAL WITH D) 500-200 MG-UNIT tablet Take 1 tablet by mouth daily with breakfast.    Historical Provider, MD  carvedilol (COREG) 6.25 MG tablet Take 1 tablet (6.25 mg total) by mouth 2 (two) times daily with a meal. 12/08/15   Pixie Casino, MD  docusate sodium 100  MG CAPS Take 200 mg by mouth at bedtime as needed. Patient not taking: Reported on 02/18/2016 08/26/12   Orson Eva, MD  feeding supplement (BOOST / RESOURCE BREEZE) LIQD Take 1 Container by mouth 3 (three) times daily between meals. 01/26/16   Iline Oven, MD  ferrous sulfate 325 (65 FE) MG tablet Take 325 mg by mouth daily with breakfast.    Historical Provider, MD  furosemide (LASIX) 80 MG tablet Take 1 tablet (80 mg total) by mouth daily. 11/10/15 09/21/18  Erlene Quan, PA-C  gabapentin (NEURONTIN) 600 MG tablet Take 1 tablet (600 mg total) by mouth 3 (three) times daily. Please dose at 8am, 2pm and 8pm 01/19/16   Marcial Pacas, MD  Guaifenesin [facility] MAXIMUM STRENGTH) 1200 MG TB12 Take 1 tablet (1,200 mg total) by mouth every 12 (twelve) hours as needed. 02/22/16   Dorian Heckle English, PA  guaiFENesin-dextromethorphan (ROBITUSSIN DM) 100-10 MG/5ML syrup Take 5 mLs by mouth every 4 (four) hours. 01/26/16   Iline Oven, MD  ipratropium-albuterol (DUONEB) 0.5-2.5 (3) MG/3ML SOLN Take 3 mLs by nebulization every 6 (six) hours as needed. Patient not taking: Reported on 02/18/2016 01/04/16   Liberty Handy, MD  lisinopril (PRINIVIL,ZESTRIL) 10 MG tablet Take 1 tablet (10 mg total) by mouth daily. 02/23/16   Pixie Casino, MD  Melatonin 10 MG TABS Take 10 mg by mouth daily.    Historical Provider, MD  OXcarbazepine (TRILEPTAL) 150 MG tablet Take 150 mg by mouth. TAKE 1/2 TABLET IN THE MORNING, NOON, AND AT NIGHT.    Historical Provider, MD  potassium chloride SA (K-DUR,KLOR-CON) 20 MEQ tablet Take 20 mEq by mouth daily.    Historical Provider, MD  Probiotic Product (ALIGN PO) Take 1 tablet by mouth daily.    Historical Provider, MD  senna-docusate (SENOKOT-S) 8.6-50 MG tablet Take 1 tablet by mouth 2 (two) times daily. 01/26/16   Iline Oven, MD  simvastatin (ZOCOR) 10 MG tablet Take 0.5 tablets (5 mg total) by mouth daily at 6 PM. 11/24/15   Pixie Casino, MD   BP 96/59 mmHg  Pulse 63   Temp(Src) 98.2 F (36.8 C) (Oral)  Resp 24  Ht 5\' 3"  (1.6 m)  Wt 125 lb 9.6 oz (56.972 kg)  BMI 22.25 kg/m2  SpO2 95% Physical Exam  Constitutional: She is oriented to person, place, and time. She appears well-developed and well-nourished. No distress.  HENT:  Head: Normocephalic.  Eyes: Conjunctivae are normal.  Neck: Neck supple. No tracheal deviation present.  Cardiovascular: Normal rate, regular rhythm and normal heart sounds.   Pulmonary/Chest: Effort normal. Tachypnea noted. No respiratory distress. She has wheezes (diffuse inspiratory and expiratory).  Abdominal: Soft. She exhibits no distension. There is no tenderness.  Neurological: She is alert and oriented to person, place, and time.  Skin: Skin is  warm and dry.  Psychiatric: She has a normal mood and affect.  Vitals reviewed.   ED Course  Procedures (including critical care time) Labs Review Labs Reviewed  CBC WITH DIFFERENTIAL/PLATELET  BASIC METABOLIC PANEL  BRAIN NATRIURETIC PEPTIDE  I-STAT [place], ED    Imaging Review Dg Chest 2 View  02/28/2016  CLINICAL DATA:  Shortness of breath, cough and hoarseness. EXAM: CHEST  2 VIEW COMPARISON:  01/20/2016 FINDINGS: There is chronic cardiomegaly. Pulmonary vascularity is normal. No infiltrates or effusions. Lungs are hyperinflated consistent with emphysema. No acute osseous abnormality. IMPRESSION: Chronic cardiomegaly. Hyperinflated lungs consistent with emphysema. Electronically Signed   By: Lorriane Shire M.D.   On: 02/28/2016 10:32   I have personally reviewed and evaluated these images and lab results as part of my medical decision-making.   EKG Interpretation   Date/Time:  Monday February 28 2016 09:59:51 EDT Ventricular Rate:  78 PR Interval:    QRS Duration: 127 QT Interval:  444 QTC Calculation: 506 R Axis:   -80 Text Interpretation:  Atrial fibrillation Left bundle branch block No  significant change since last tracing Confirmed by Nicky Kras MD, Kenith Trickel   [id number]) on 02/28/2016 10:05:43 AM      MDM   Final diagnoses:  COPD exacerbation ([place])    82 y.o. female presents with increasing shortness of breath, recently completed azithromycin after testing flu negative at urgent care. Diffusely wheezing on arrival and not cleared after 2 duo-nebs and steroids. Requiring 2L of O2, on intermittently at home but has not been needing. Had been weaned off of all controller meds for possible COPD, appears to be in exacerbation, no infiltrate on CXR to suggest need for ABx currently. Hospitalist was consulted for admission and will see the patient in the emergency department.     Leo Grosser, MD 02/28/16 2209

## 2016-02-28 NOTE — H&P (Signed)
[facility] History and Physical  Angela Franco [id number] DOB: February 12, 1929 DOA: 02/28/2016  Referring physician: Laneta Simmers PCP: No PCP Per Patient   Chief Complaint: sob  HPI: Angela Franco is a very pleasant 82 y.o. female with a past medical history that includes COPD, atrial fib, hypertension, CHF, anemia, left bundle branch block, nonischemic cardiomyopathy presents to the emergency department with a chief complaint of worsening persistent shortness of breath. Initial evaluation reveals acute respiratory failure with hypoxia likely related to the COPD exacerbation  Information is obtained from the patient and the chart. Patient reports hospitalization last month and chart review indicates for hyponatremia and atrial fibrillation as well as RSV pneumonia which was her second in 3 months. States she was discharged home was doing fairly well but over the last several days developed worsening shortness of breath, nasal congestion and cough. She went to an urgent care and reports being tested negative for flu was given antibiotics she completed reports no improvement. She has been provided with oxygen by her cardiologist in the past to be used on an as-needed basis and she reports needing her oxygen over the last 3 days. Reports shortness of breath at rest as well as with exertion. She denies chest pain palpitation headache dizziness syncope or near-syncope. She denies lower extremity edema orthopnea. She denies abdominal pain nausea vomiting dysuria hematuria frequency or urgency. He does report a moist sounding nonproductive frequent cough.  In the emergency department she is afebrile with a somewhat soft blood pressure and intermittent hypoxia. He is provided with nebulizers and Solu-Medrol  Review of Systems:  10 point review of systems complete and all systems are negative except as indicated in the history of present illness  Past Medical History  Diagnosis Date  . COPD (chronic  obstructive pulmonary disease) ([place])     Never smoked. Questionable diagnosis.  . Atrial fibrillation ([place])   . Osteopenia   . Insomnia   . Hyponatremia   . Hypertension   . UTI (lower urinary tract infection)   . Bursitis of hip   . Trigeminal neuralgia   . Altered mental status 08/24/2012  . Anemia, with significant drop in H/H 08/24/2012  . CHF (congestive heart failure) ([place])   . Heart disease   . Acute facial pain   . LBBB (left bundle branch block)   . Nonischemic cardiomyopathy ([place])   . Dyslipidemia   . Kidney failure     Stage 3   Past Surgical History  Procedure Laterality Date  . Appendectomy    . Tonsillectomy and adenoidectomy    . Cardiac catheterization  07/04/2012    normal coronaries, EF 30-35%, elevated R & L heart pressures, reduced CO, mild pulm HTN (Dr. K. Mali Hilty)  . Transthoracic echocardiogram  2013    mild conc LVH, RV mildly dilated; LA severely dilated; RA mod dilated; mild-mod mitral annular calcif, calcif of anterior & posterior MV leaflet, mod MR; mod-severe TR with RSVP 30-60mmHg; mild calcif of AV leaflets and mod aortic regurg; mild pulm valve regurg; pericardial effusion (right)  . Nm myocar perf wall motion  2011    lexiscan myoview - normal LV systolic function w/normal wall motion, no evidence of scar/ischemia  . Left and right heart catheterization with coronary angiogram N/A 07/04/2012    Procedure: LEFT AND RIGHT HEART CATHETERIZATION WITH CORONARY ANGIOGRAM;  Surgeon: Pixie Casino, MD;  Location: [facility] CATH LAB;  Service: Cardiovascular;  Laterality: N/A;   Social History:  reports that she has  never smoked. She has never used smokeless tobacco. She reports that she does not drink alcohol or use illicit drugs.  Allergies  Allergen Reactions  . Penicillins Hives    Has patient had a PCN reaction causing immediate rash, facial/tongue/throat swelling, SOB or lightheadedness with hypotension: No Has patient had a PCN reaction causing severe  rash involving mucus membranes or skin necrosis: No Has patient had a PCN reaction that required hospitalization No Has patient had a PCN reaction occurring within the last 10 years: No If all of the above answers are "NO", then may proceed with Cephalosporin use.    Family History  Problem Relation Age of Onset  . Stroke Father   . Heart attack Father   . Heart attack Brother   . Heart attack Brother   . Alzheimer's disease Mother     Prior to Admission medications   Medication Sig Start Date End Date Taking? Authorizing Provider  apixaban (ELIQUIS) 2.5 MG TABS tablet Take 1 tablet (2.5 mg total) by mouth 2 (two) times daily. 11/16/15  Yes Pixie Casino, MD  calcium-vitamin D (OSCAL WITH D) 500-200 MG-UNIT tablet Take 1 tablet by mouth daily with breakfast.   Yes Historical Provider, MD  carvedilol (COREG) 6.25 MG tablet Take 1 tablet (6.25 mg total) by mouth 2 (two) times daily with a meal. 12/08/15  Yes Pixie Casino, MD  ferrous sulfate 325 (65 FE) MG tablet Take 325 mg by mouth daily with breakfast.   Yes Historical Provider, MD  furosemide (LASIX) 80 MG tablet Take 1 tablet (80 mg total) by mouth daily. 11/10/15 09/21/18 Yes Luke K Kilroy, PA-C  gabapentin (NEURONTIN) 600 MG tablet Take 1 tablet (600 mg total) by mouth 3 (three) times daily. Please dose at 8am, 2pm and 8pm 01/19/16  Yes Marcial Pacas, MD  lisinopril (PRINIVIL,ZESTRIL) 10 MG tablet Take 1 tablet (10 mg total) by mouth daily. 02/23/16  Yes Pixie Casino, MD  Melatonin 10 MG TABS Take 10 mg by mouth daily.   Yes Historical Provider, MD  potassium chloride SA (K-DUR,KLOR-CON) 20 MEQ tablet Take 20 mEq by mouth daily.   Yes Historical Provider, MD  simvastatin (ZOCOR) 10 MG tablet Take 0.5 tablets (5 mg total) by mouth daily at 6 PM. 11/24/15  Yes Pixie Casino, MD  docusate sodium 100 MG CAPS Take 200 mg by mouth at bedtime as needed. Patient not taking: Reported on 02/18/2016 08/26/12   Orson Eva, MD  feeding supplement  (BOOST / RESOURCE BREEZE) LIQD Take 1 Container by mouth 3 (three) times daily between meals. Patient not taking: Reported on 02/28/2016 01/26/16   Iline Oven, MD  Guaifenesin [facility] MAXIMUM STRENGTH) 1200 MG TB12 Take 1 tablet (1,200 mg total) by mouth every 12 (twelve) hours as needed. Patient not taking: Reported on 02/28/2016 02/22/16   Dorian Heckle English, PA  guaiFENesin-dextromethorphan (ROBITUSSIN DM) 100-10 MG/5ML syrup Take 5 mLs by mouth every 4 (four) hours. Patient not taking: Reported on 02/28/2016 01/26/16   Iline Oven, MD  ipratropium-albuterol (DUONEB) 0.5-2.5 (3) MG/3ML SOLN Take 3 mLs by nebulization every 6 (six) hours as needed. Patient not taking: Reported on 02/18/2016 01/04/16   Liberty Handy, MD  senna-docusate (SENOKOT-S) 8.6-50 MG tablet Take 1 tablet by mouth 2 (two) times daily. Patient not taking: Reported on 02/28/2016 01/26/16   Iline Oven, MD   Physical Exam: Filed Vitals:   02/28/16 1150 02/28/16 1200 02/28/16 1215 02/28/16 1230  BP: 105/55 106/62 113/71  114/67  Pulse: 69 65 74 146  Temp:      TempSrc:      Resp: 20 20 23 22   Height:      Weight:      SpO2: 99% 98% 99% 94%    Wt Readings from Last 3 Encounters:  02/28/16 56.972 kg (125 lb 9.6 oz)  02/22/16 59.603 kg (131 lb 6.4 oz)  02/18/16 60.328 kg (133 lb)    General:  Appears calm Slightly pale sitting straight up in bed mildly anxious slightly uncomfortable Eyes: PERRL, normal lids, irises & conjunctiva ENT: grossly normal hearing, lips & tongue, mucous membranes of her mouth are pink slightly dry Neck: no LAD, masses or thyromegaly Cardiovascular: Irregularly irregular I hear no m/r/g. No LE edema. Respiratory: Mild increased work of breathing at rest moderate with conversation. Breath sounds were fairly good air flow fine crackles bilateral bases diffuse rhonchi with faint end expiratory wheeze throughout Abdomen: soft, ntnd positive bowel sounds no guarding or rebounding Skin:  no rash or induration seen on limited exam Musculoskeletal: grossly normal tone BUE/BLE Psychiatric: grossly normal mood and affect, speech fluent and appropriate Neurologic: grossly non-focal. Speech clear facial symmetry some short-term memory deficits as commands able to make once a needs known           Labs on Admission:  Basic Metabolic Panel:  Recent Labs Lab 02/28/16 1020  NA 130*  K 4.9  CL 95*  CO2 25  GLUCOSE 94  BUN 55*  CREATININE 1.78*  CALCIUM 8.8*   Liver Function Tests: No results for input(s): AST, ALT, ALKPHOS, BILITOT, PROT, ALBUMIN in the last 168 hours. No results for input(s): LIPASE, AMYLASE in the last 168 hours. No results for input(s): AMMONIA in the last 168 hours. CBC:  Recent Labs Lab 02/22/16 1742 02/28/16 1020  WBC 5.7 4.4  NEUTROABS  --  1.8  HGB 10.5* 10.1*  HCT 30.8* 30.2*  MCV 95.0 95.6  PLT  --  163   Cardiac Enzymes: No results for input(s): CKTOTAL, CKMB, CKMBINDEX, TROPONINI in the last 168 hours.  BNP (last 3 results)  Recent Labs  01/02/16 1304 01/20/16 2241 02/28/16 1020  BNP 273.0* 234.3* 48.2    ProBNP (last 3 results) No results for input(s): PROBNP in the last 8760 hours.  CBG: No results for input(s): GLUCAP in the last 168 hours.  Radiological Exams on Admission: Dg Chest 2 View  02/28/2016  CLINICAL DATA:  Shortness of breath, cough and hoarseness. EXAM: CHEST  2 VIEW COMPARISON:  01/20/2016 FINDINGS: There is chronic cardiomegaly. Pulmonary vascularity is normal. No infiltrates or effusions. Lungs are hyperinflated consistent with emphysema. No acute osseous abnormality. IMPRESSION: Chronic cardiomegaly. Hyperinflated lungs consistent with emphysema. Electronically Signed   By: Lorriane Shire M.D.   On: 02/28/2016 10:32    EKG: Independently reviewed. Atrial fibrillation Left bundle branch block No significant change since last tracing  Assessment/Plan Principal Problem:   Acute respiratory failure  with hypoxia [facility]) Active Problems:   Normocytic anemia   Atrial fibrillation, chronic (HCC)   Moderate to severe pulmonary hypertension, PA pressure in 70s June 2013   Non-ischemic cardiomyopathy EF improved from 30-35% to 40-45% by Echo in 07/2012   HTN (hypertension)   COPD exacerbation (HCC)   Acute renal failure superimposed on stage 3 chronic kidney disease ([place])  #1. Acute respiratory failure with hypoxia likely related to COPD chest relation. Patient reportedly without formal diagnosis of COPD. Was prescribed oxygen by her cardiologist to  be used on an as-needed basis. Chest x-ray with chronic cardiomegaly no infiltrates or effusions with hyperinflated lungs consistent with emphysema. Oxygen saturation level 94% on 2 L nasal cannula. -Admit to telemetry -Nebulizer treatments every 4 hours -Solu-Medrol 60 mg every 6 hours -Antitussive -Influenza panel -Antibiotics -Oxygen supplementation -wean oxygen as able  #2. COPD exacerbation. Patient reports not being [place] with COPD and cough but home medications do include duo nebs.  -See #1 -may benefit from outpatient pulmonary function tests  3. Hypertension. Blood pressure at the low end of normal on admission. Home medications include Coreg, Lasix, lisinopril. Systolic blood pressure in the 90s on admission -We'll hold Coreg Lasix and with Cipro for now -Monitor blood pressure closely -We'll resume medications as indicated  #4. Nonischemic cardiomyopathy. BNP within the limits of normal Recent cath reveals EF of 35-45% with grade 1 diastolic dysfunction. Appears compensated -Medications include ACE inhibitor and beta blocker -Monitor daily weights -Monitor intake and output  #5. On chronic kidney disease stage III. Chart review indicates baseline creatinine 1.3-1.4 range. Creatinine on admission 1.7 -We'll hold any nephrotoxins -Monitor urine output -Recheck in the morning  6. A. Fib CHADvasc score 5. Currently rate  controlled. On Eliquis -Continue Eliquis -Monitor  #7. Anemia. Normocytic. Underwent recent GI workup. History of iron deficiency anemia. Hemoglobin 10.1 on admission. Chart review indicates this is close to her baseline. -Continue iron supplement -Monitor   Code Status: dnr DVT Prophylaxis: Family Communication: none present Disposition Plan: home when ready  Time spent: 36 minutes  [facility]

## 2016-02-28 NOTE — ED Notes (Signed)
Third breathing treatment given by nurse at 1211. After 2 breathing treatments patient states feels better.

## 2016-02-29 DIAGNOSIS — I255 Ischemic cardiomyopathy: Secondary | ICD-10-CM | POA: Diagnosis present

## 2016-02-29 DIAGNOSIS — J440 Chronic obstructive pulmonary disease with acute lower respiratory infection: Secondary | ICD-10-CM | POA: Diagnosis present

## 2016-02-29 DIAGNOSIS — I130 Hypertensive heart and chronic kidney disease with heart failure and stage 1 through stage 4 chronic kidney disease, or unspecified chronic kidney disease: Secondary | ICD-10-CM | POA: Diagnosis present

## 2016-02-29 DIAGNOSIS — Z9981 Dependence on supplemental oxygen: Secondary | ICD-10-CM | POA: Diagnosis not present

## 2016-02-29 DIAGNOSIS — I429 Cardiomyopathy, unspecified: Secondary | ICD-10-CM | POA: Diagnosis not present

## 2016-02-29 DIAGNOSIS — M8580 Other specified disorders of bone density and structure, unspecified site: Secondary | ICD-10-CM | POA: Diagnosis present

## 2016-02-29 DIAGNOSIS — Z9889 Other specified postprocedural states: Secondary | ICD-10-CM | POA: Diagnosis not present

## 2016-02-29 DIAGNOSIS — I509 Heart failure, unspecified: Secondary | ICD-10-CM | POA: Diagnosis present

## 2016-02-29 DIAGNOSIS — Z8249 Family history of ischemic heart disease and other diseases of the circulatory system: Secondary | ICD-10-CM | POA: Diagnosis not present

## 2016-02-29 DIAGNOSIS — Z9842 Cataract extraction status, left eye: Secondary | ICD-10-CM | POA: Diagnosis not present

## 2016-02-29 DIAGNOSIS — N183 Chronic kidney disease, stage 3 (moderate): Secondary | ICD-10-CM | POA: Diagnosis present

## 2016-02-29 DIAGNOSIS — Z961 Presence of intraocular lens: Secondary | ICD-10-CM | POA: Diagnosis present

## 2016-02-29 DIAGNOSIS — J9621 Acute and chronic respiratory failure with hypoxia: Secondary | ICD-10-CM | POA: Diagnosis present

## 2016-02-29 DIAGNOSIS — J1008 Influenza due to other identified influenza virus with other specified pneumonia: Secondary | ICD-10-CM | POA: Diagnosis present

## 2016-02-29 DIAGNOSIS — Z9049 Acquired absence of other specified parts of digestive tract: Secondary | ICD-10-CM | POA: Diagnosis not present

## 2016-02-29 DIAGNOSIS — Z823 Family history of stroke: Secondary | ICD-10-CM | POA: Diagnosis not present

## 2016-02-29 DIAGNOSIS — I447 Left bundle-branch block, unspecified: Secondary | ICD-10-CM | POA: Diagnosis present

## 2016-02-29 DIAGNOSIS — D509 Iron deficiency anemia, unspecified: Secondary | ICD-10-CM | POA: Diagnosis present

## 2016-02-29 DIAGNOSIS — Z66 Do not resuscitate: Secondary | ICD-10-CM | POA: Diagnosis present

## 2016-02-29 DIAGNOSIS — I482 Chronic atrial fibrillation: Secondary | ICD-10-CM | POA: Diagnosis present

## 2016-02-29 DIAGNOSIS — Z880 Allergy status to penicillin: Secondary | ICD-10-CM | POA: Diagnosis not present

## 2016-02-29 DIAGNOSIS — Z9841 Cataract extraction status, right eye: Secondary | ICD-10-CM | POA: Diagnosis not present

## 2016-02-29 DIAGNOSIS — E871 Hypo-osmolality and hyponatremia: Secondary | ICD-10-CM | POA: Diagnosis present

## 2016-02-29 DIAGNOSIS — Z79899 Other long term (current) drug therapy: Secondary | ICD-10-CM | POA: Diagnosis not present

## 2016-02-29 DIAGNOSIS — J111 Influenza due to unidentified influenza virus with other respiratory manifestations: Secondary | ICD-10-CM

## 2016-02-29 DIAGNOSIS — J441 Chronic obstructive pulmonary disease with (acute) exacerbation: Secondary | ICD-10-CM | POA: Diagnosis present

## 2016-02-29 DIAGNOSIS — J9601 Acute respiratory failure with hypoxia: Secondary | ICD-10-CM | POA: Diagnosis not present

## 2016-02-29 DIAGNOSIS — N179 Acute kidney failure, unspecified: Secondary | ICD-10-CM | POA: Diagnosis present

## 2016-02-29 DIAGNOSIS — I272 Other secondary pulmonary hypertension: Secondary | ICD-10-CM | POA: Diagnosis present

## 2016-02-29 DIAGNOSIS — G500 Trigeminal neuralgia: Secondary | ICD-10-CM | POA: Diagnosis present

## 2016-02-29 DIAGNOSIS — G4700 Insomnia, unspecified: Secondary | ICD-10-CM | POA: Diagnosis present

## 2016-02-29 DIAGNOSIS — Z820 Family history of epilepsy and other diseases of the nervous system: Secondary | ICD-10-CM | POA: Diagnosis not present

## 2016-02-29 DIAGNOSIS — Z8589 Personal history of malignant neoplasm of other organs and systems: Secondary | ICD-10-CM | POA: Diagnosis not present

## 2016-02-29 LAB — BASIC METABOLIC PANEL
Anion gap: 10 (ref 5–15)
BUN: 55 mg/dL — AB (ref 6–20)
CHLORIDE: 98 mmol/L — AB (ref 101–111)
CO2: 25 mmol/L (ref 22–32)
Calcium: 8.8 mg/dL — ABNORMAL LOW (ref 8.9–10.3)
Creatinine, Ser: 2.06 mg/dL — ABNORMAL HIGH (ref 0.44–1.00)
GFR calc Af Amer: 24 mL/min — ABNORMAL LOW (ref >60)
GFR, EST NON AFRICAN AMERICAN: 21 mL/min — AB (ref >60)
GLUCOSE: 152 mg/dL — AB (ref 65–99)
POTASSIUM: 4.2 mmol/L (ref 3.5–5.1)
Sodium: 133 mmol/L — ABNORMAL LOW (ref 135–145)

## 2016-02-29 LAB — CBC
HCT: 29.2 % — ABNORMAL LOW (ref 36.0–46.0)
Hemoglobin: 9.2 g/dL — ABNORMAL LOW (ref 12.0–15.0)
MCH: 30.2 pg (ref 26.0–34.0)
MCHC: 31.5 g/dL (ref 30.0–36.0)
MCV: 95.7 fL (ref 78.0–100.0)
PLATELETS: 143 10*3/uL — AB (ref 150–400)
RBC: 3.05 MIL/uL — ABNORMAL LOW (ref 3.87–5.11)
RDW: 15.3 % (ref 11.5–15.5)
WBC: 1.6 10*3/uL — AB (ref 4.0–10.5)

## 2016-02-29 LAB — MRSA PCR SCREENING: MRSA by PCR: NEGATIVE

## 2016-02-29 MED ORDER — LEVALBUTEROL HCL 1.25 MG/0.5ML IN NEBU
1.2500 mg | INHALATION_SOLUTION | Freq: Three times a day (TID) | RESPIRATORY_TRACT | Status: DC
  Administered 2016-02-29 – 2016-03-02 (×6): 1.25 mg via RESPIRATORY_TRACT
  Filled 2016-02-29 (×6): qty 0.5

## 2016-02-29 MED ORDER — ALBUTEROL SULFATE (2.5 MG/3ML) 0.083% IN NEBU
2.5000 mg | INHALATION_SOLUTION | Freq: Three times a day (TID) | RESPIRATORY_TRACT | Status: DC
  Administered 2016-02-29 (×2): 2.5 mg via RESPIRATORY_TRACT
  Filled 2016-02-29 (×2): qty 3

## 2016-02-29 MED ORDER — OSELTAMIVIR PHOSPHATE 30 MG PO CAPS: 30.0000 mg | ORAL_CAPSULE | Freq: Two times a day (BID) | ORAL | Status: DC

## 2016-02-29 MED ORDER — CARVEDILOL 6.25 MG PO TABS
6.2500 mg | ORAL_TABLET | Freq: Two times a day (BID) | ORAL | Status: DC
  Administered 2016-02-29 – 2016-03-02 (×3): 6.25 mg via ORAL
  Filled 2016-02-29 (×3): qty 1

## 2016-02-29 MED ORDER — IPRATROPIUM BROMIDE 0.02 % IN SOLN
0.5000 mg | Freq: Three times a day (TID) | RESPIRATORY_TRACT | Status: DC
  Administered 2016-02-29 – 2016-03-02 (×6): 0.5 mg via RESPIRATORY_TRACT
  Filled 2016-02-29 (×6): qty 2.5

## 2016-02-29 MED ORDER — OSELTAMIVIR PHOSPHATE 30 MG PO CAPS
30.0000 mg | ORAL_CAPSULE | Freq: Every day | ORAL | Status: DC
  Administered 2016-02-29 – 2016-03-02 (×3): 30 mg via ORAL
  Filled 2016-02-29 (×5): qty 1

## 2016-02-29 MED ORDER — METHYLPREDNISOLONE SODIUM SUCC 125 MG IJ SOLR
60.0000 mg | Freq: Two times a day (BID) | INTRAMUSCULAR | Status: DC
  Administered 2016-03-01 – 2016-03-02 (×3): 60 mg via INTRAVENOUS
  Filled 2016-02-29 (×3): qty 2

## 2016-02-29 NOTE — Evaluation (Signed)
Occupational Therapy Evaluation Patient Details Name: Angela Franco MRN: [id number] DOB: 01-29-1929 Today's Date: 02/29/2016    History of Present Illness Angela Franco is a 82 y.o. female with a Past Medical History of COPD, Afib, hyponatremia, HTN, trigeminal neuralgia, CHF, LBBB, MI, CKD who presents with acute respiratory failure from COPD exacerbation. +Flu.   Clinical Impression   Pt seen for OT assessment followed by OT ADL retraining session today. She presents as deconditioned and benefits from vc's for breathing techniques and rest breaks/energy conservation tech's (see OT problem list below). OT session focused on functional mobiity in room/transfers, grooming standing and sitting at sink as well as UB/LB ADL's. Will follow acutely.    Follow Up Recommendations  Home health OT;Supervision - Intermittent    Equipment Recommendations  None recommended by OT    Recommendations for Other Services       Precautions / Restrictions Precautions Precautions: Fall;Other (comment) Precaution Comments: Droplet, + Flu per MD report Restrictions Weight Bearing Restrictions: No      Mobility Bed Mobility               General bed mobility comments: Pt sitting up at EOB finishing breakfast upon OT arrival today  Transfers Overall transfer level: Needs assistance Equipment used: Rolling walker (2 wheeled) Transfers: Sit to/from [facility] Sit to Stand: Supervision Stand pivot transfers: Min guard       General transfer comment: VC's for hand placement and RW safety as well as rest breaks and breathing techniques. Pt O2 decreases as she is amb and talking. VC's to focus on breathing and not talking during ambulation and ADL's to conserve energy.    Balance Overall balance assessment: Needs assistance Sitting-balance support: No upper extremity supported;Feet supported Sitting balance-Leahy Scale: Good     Standing balance support: No upper  extremity supported;During functional activity Standing balance-Leahy Scale: Fair                              ADL Overall ADL's : Needs assistance/impaired Eating/Feeding: Independent;Sitting   Grooming: Wash/dry hands;Wash/dry face;Oral care;Brushing hair;Supervision/safety;Sitting;Standing Grooming Details (indicate cue type and reason): VC's for rest breaks and pursed lip breathing Upper Body Bathing: Supervision/ safety;Sitting;Standing   Lower Body Bathing: Minimal assistance;Sitting/lateral leans;Sit to/from stand   Upper Body Dressing : Set up;Supervision/safety;Sitting   Lower Body Dressing: Minimal assistance;Sitting/lateral leans;Sit to/from stand   Toilet Transfer: Min guard;Ambulation;RW Toilet Transfer Details (indicate cue type and reason): VC's for safety with RW/hand placement Toileting- Clothing Manipulation and Hygiene: Min guard;Sit to/from stand       Functional mobility during ADLs: Min guard;Rolling walker (To manage O2 line & safety w/ RW) General ADL Comments: Pt seen for OT assessment followed by OT ADL retraining session today. She presents as deconditioned and benefits from vc's for breathing techniques and rest breaks/energy conservation tech's. OT session focused on functional mobiity in room/transfers, grooming standing and sitting at sink as well as UB/LB ADL's. Will follow acutely.     Vision     Perception     Praxis      Pertinent Vitals/Pain Pain Assessment: No/denies pain     Hand Dominance Right   Extremity/Trunk Assessment Upper Extremity Assessment Upper Extremity Assessment: Overall WFL for tasks assessed (Pt reports h/o impairment w/ Right shoulder flexion after pervious injury. Pt is able to reach back of her head.)   Lower Extremity Assessment Lower Extremity Assessment: Defer to PT evaluation  Communication Communication Communication: No difficulties   Cognition Arousal/Alertness: Awake/alert Behavior  During Therapy: WFL for tasks assessed/performed Overall Cognitive Status: Within Functional Limits for tasks assessed                     General Comments       Exercises       Shoulder Instructions      Home Living Family/patient expects to be discharged to:: Other (Comment) ([facility])                                 Additional Comments: Uses O2 PRN      Prior Functioning/Environment Level of Independence: Independent with assistive device(s)        Comments: use of RW and O2 when pt needs. walks to dining hall and attends multiple events at ALF, had done group exercise classes in the past and hopes to return to that    OT Diagnosis: Generalized weakness   OT Problem List: Decreased activity tolerance;Decreased knowledge of use of DME or AE;Impaired UE functional use;Decreased range of motion;Cardiopulmonary status limiting activity;Decreased strength   OT Treatment/Interventions: Self-care/ADL training;Energy conservation;DME and/or AE instruction;Patient/family education;Therapeutic activities    OT Goals(Current goals can be found in the care plan section) Acute Rehab OT Goals Patient Stated Goal: Get well, get rid of this and don't come back Time For Goal Achievement: 03/14/16 Potential to Achieve Goals: Good ADL Goals Pt Will Perform Lower Body Dressing: with modified independence;with adaptive equipment;sitting/lateral leans;sit to/from stand Pt Will Transfer to Toilet: with modified independence;ambulating;bedside commode Pt Will Perform Toileting - Clothing Manipulation and hygiene: with modified independence;sitting/lateral leans;sit to/from stand Additional ADL Goal #1: Pt will I'ly state 2-3 energy conservation techniques and implement during ADL's  OT Frequency: Min 2X/week   Barriers to D/C:            Co-evaluation              End of Session Equipment Utilized During Treatment: Gait belt;Rolling  walker;Oxygen  Activity Tolerance: Patient tolerated treatment well;Other (comment) (O2 decreases from mid-high 90's to 84% while talking and walking today, quickly returns to 95% w/ vc's for breathign tech and no talking.) Patient left: in chair;with call bell/phone within reach;with chair alarm set;Other (comment) (Respiratory in room for breathing treatment)   Time: [id number] OT Time Calculation (min): 39 min Charges:  OT General Charges $OT Visit: 1 Procedure OT Evaluation $OT Eval Moderate Complexity: 1 Procedure OT Treatments $Self Care/Home Management : 8-22 mins G-Codes: OT G-codes **NOT FOR INPATIENT CLASS** Functional Assessment Tool Used: Clinical Judgement Functional Limitation: Self care Self Care Current Status [id number]): At least 1 percent but less than 20 percent impaired, limited or restricted Self Care Goal Status [id number]): At least 1 percent but less than 20 percent impaired, limited or restricted  Almyra Deforest, OTR/L 02/29/2016, 10:49 AM

## 2016-02-29 NOTE — Progress Notes (Signed)
PROGRESS NOTE  Angela Franco [id number] DOB: March 18, 1929 DOA: 02/28/2016 PCP: No PCP Per Patient  HPI/Recap of past 24 hours:  Feeling better, sitting in chair, NAD, very pleasant  Assessment/Plan: Principal Problem:   Acute respiratory failure with hypoxia ([place]) Active Problems:   Normocytic anemia   Atrial fibrillation, chronic (HCC)   Moderate to severe pulmonary hypertension, PA pressure in 70s June 2013   Non-ischemic cardiomyopathy EF improved from 30-35% to 40-45% by Echo in 07/2012   HTN (hypertension)   COPD exacerbation (HCC)   Acute renal failure superimposed on stage 3 chronic kidney disease (HCC)  Acute on chronic hypoxic respiratory failure,  + influenza B, with diffuse wheezing on presentation, no wheezing on today's exam. No official diagnosis of copd, no prior smoking history, but cxr consistent with emphysema.  per chart review patient has been admitted for influenza A in 1/207, then RSV pna on 01/2016, now back with wheezing again and tested + for influenza B, started tamiflu (renal dosing), continue nebs. Wean oxygen, ( reported baseline on home o2 prn)  COPD exacerbation;   No official diagnosis of copd, no prior smoking history, but cxr consistent with emphysema.  cxr no acute findings,  -wheezing improved, taper steroids, continue nebs , d/c albuterol, change to xopenex due to intermittent tachycardia with nebs, on doxycycline -may benefit from outpatient pulmonary function tests  Hypertension.  Blood pressure at the low end of normal on admission. Home medications include Coreg, Lasix, lisinopril. Systolic blood pressure in the 90s on admission -continue coreeg, hold lasix and lisinopirl   Nonischemic cardiomyopathy. BNP within the limits of normal Recent cath reveals EF of 35-45% with grade 1 diastolic dysfunction. Appears compensated -currently dry, with mildly elevated cr from baseline, and low normal bp, continue coreg, hold lasix and  lisinopril.  On chronic kidney disease stage III. Chart review indicates baseline creatinine 1.3-1.4 range. Creatinine on admission 1.7 -cr mildly elevated from baseline, check ua, avoid  nephrotoxin,-Recheck in the morning  A. Fib CHADvasc score 5. Currently rate controlled. On Eliquis -Continue Eliquis and coreg -Monitor on tele   Anemia. Normocytic. Underwent recent GI workup. History of iron deficiency anemia. Hemoglobin 10.1 on admission. Chart review indicates this is close to her baseline. -Continue iron supplement -Monitor   Code Status: DNR  Family Communication: patient   Disposition Plan: pending   Consultants:  none  Procedures:  none  Antibiotics:  doxycyclin    Objective: BP 101/55 mmHg  Pulse 64  Temp(Src) 97.7 F (36.5 C) (Oral)  Resp 18  Ht 5\' 3"  (1.6 m)  Wt 57.199 kg (126 lb 1.6 oz)  BMI 22.34 kg/m2  SpO2 96%  Intake/Output Summary (Last 24 hours) at 02/29/16 1723 Last data filed at 02/29/16 1350  Gross per 24 hour  Intake    680 ml  Output    900 ml  Net   -220 ml   Filed Weights   02/28/16 0940 02/28/16 1813 02/29/16 0607  Weight: 56.972 kg (125 lb 9.6 oz) 57.2 kg (126 lb 1.7 oz) 57.199 kg (126 lb 1.6 oz)    Exam:   General:  NAD  Cardiovascular: IRRR  Respiratory: CTABL  Abdomen: Soft/ND/NT, positive BS  Musculoskeletal: No Edema  Neuro: aaox3  Data Reviewed: Basic Metabolic Panel:  Recent Labs Lab 02/28/16 1020 02/29/16 0439  NA 130* 133*  K 4.9 4.2  CL 95* 98*  CO2 25 25  GLUCOSE 94 152*  BUN 55* 55*  CREATININE 1.78* 2.06*  CALCIUM  8.8* 8.8*   Liver Function Tests: No results for input(s): AST, ALT, ALKPHOS, BILITOT, PROT, ALBUMIN in the last 168 hours. No results for input(s): LIPASE, AMYLASE in the last 168 hours. No results for input(s): AMMONIA in the last 168 hours. CBC:  Recent Labs Lab 02/22/16 1742 02/28/16 1020 02/29/16 0439  WBC 5.7 4.4 1.6*  NEUTROABS  --  1.8  --   HGB 10.5* 10.1*  9.2*  HCT 30.8* 30.2* 29.2*  MCV 95.0 95.6 95.7  PLT  --  163 143*   Cardiac Enzymes:   No results for input(s): CKTOTAL, CKMB, CKMBINDEX, TROPONINI in the last 168 hours. BNP (last 3 results)  Recent Labs  01/02/16 1304 01/20/16 2241 02/28/16 1020  BNP 273.0* 234.3* 48.2    ProBNP (last 3 results) No results for input(s): PROBNP in the last 8760 hours.  CBG: No results for input(s): GLUCAP in the last 168 hours.  Recent Results (from the past 240 hour(s))  MRSA PCR Screening     Status: None   Collection Time: 02/28/16  9:53 PM  Result Value Ref Range Status   MRSA by PCR NEGATIVE NEGATIVE Final    Comment:        The GeneXpert MRSA Assay (FDA approved for NASAL specimens only), is one component of a comprehensive MRSA colonization surveillance program. It is not intended to diagnose MRSA infection nor to guide or monitor treatment for MRSA infections.      Studies: No results found.  Scheduled Meds: . albuterol  2.5 mg Nebulization TID  . apixaban  2.5 mg Oral BID  . calcium-vitamin D  1 tablet Oral Q breakfast  . carvedilol  6.25 mg Oral BID WC  . doxycycline  100 mg Oral Q12H  . ferrous sulfate  325 mg Oral Q breakfast  . gabapentin  600 mg Oral TID  . guaiFENesin  600 mg Oral BID  . methylPREDNISolone (SOLU-MEDROL) injection  60 mg Intravenous Q6H  . oseltamivir  30 mg Oral Daily  . simvastatin  5 mg Oral q1800  . sodium chloride flush  3 mL Intravenous Q12H    Continuous Infusions:    Time spent: 51mins  Liara Holm MD, PhD  [facility] Pager [phone]. If 7PM-7AM, please contact night-coverage at [email or web address], password [facility] 02/29/2016, 5:23 PM  LOS: 0 days

## 2016-02-29 NOTE — Evaluation (Signed)
Physical Therapy Evaluation Patient Details Name: Angela Franco MRN: [id number] DOB: 1929/09/15 Today's Date: 02/29/2016   History of Present Illness  Angela Franco is a 82 y.o. female with a Past Medical History of COPD, Afib, hyponatremia, HTN, trigeminal neuralgia, CHF, LBBB, MI, CKD who presents with acute respiratory failure from COPD exacerbation. +Flu.  Clinical Impression  Patient demonstrates deficits in functional mobility as indicated below. Will need continued skilled PT to address deficits and maximize function. Will see as indicated and progress as tolerated. OF NOTE: Patient with elevation in HR 140s afib, and desaturation to 88% on room air. Returned to 3 liters [place] with saturations returning to 97%.     Follow Up Recommendations Home health PT;Supervision for mobility/OOB    Equipment Recommendations  None recommended by PT    Recommendations for Other Services       Precautions / Restrictions Precautions Precautions: Fall;Other (comment) Precaution Comments: Droplet, + Flu per MD report Restrictions Weight Bearing Restrictions: No      Mobility  Bed Mobility               General bed mobility comments: Pt sitting up at EOB finishing breakfast upon OT arrival today  Transfers Overall transfer level: Needs assistance Equipment used: Rolling walker (2 wheeled) Transfers: Sit to/from Stand Sit to Stand: Supervision         General transfer comment: VC's for hand placement and RW safety as well as rest breaks and breathing techniques. Pt O2 decreases as she is amb and talking. VC's to focus on breathing and not talking during ambulation and ADL's to conserve energy.  Ambulation/Gait Ambulation/Gait assistance: Supervision Ambulation Distance (Feet): 110 Feet Assistive device: Rolling walker (2 wheeled) Gait Pattern/deviations: WFL(Within Functional Limits) Gait velocity: impulsive Gait velocity interpretation: at or above normal speed for  age/gender General Gait Details: ambulated on room air, desaturation to 88% on room air with elevated HR 140s afib. Patient noted with DOE 3/4. increased WOB. Educated on pursed lip breathing and breath support  Financial trader [employer] (Stroke Patients Only)       Balance   Sitting-balance support: No upper extremity supported Sitting balance-Leahy Scale: Good     Standing balance support: No upper extremity supported Standing balance-Leahy Scale: Fair                               Pertinent Vitals/Pain      Home Living Family/patient expects to be discharged to:: Other (Comment) ([facility])                 Additional Comments: Uses O2 PRN    Prior Function Level of Independence: Independent with assistive device(s)         Comments: use of RW and O2 when pt needs. walks to dining hall and attends multiple events at ALF, had done group exercise classes in the past and hopes to return to that     Hand Dominance   Dominant Hand: Right    Extremity/Trunk Assessment   Upper Extremity Assessment: Overall WFL for tasks assessed           Lower Extremity Assessment: Overall WFL for tasks assessed         Communication   Communication: No difficulties  Cognition Arousal/Alertness: Awake/alert Behavior During Therapy: WFL for tasks assessed/performed;Impulsive Overall Cognitive Status: Within Functional  Limits for tasks assessed                      General Comments      Exercises        Assessment/Plan    PT Assessment Patient needs continued PT services  PT Diagnosis Difficulty walking   PT Problem List Decreased activity tolerance;Decreased balance;Decreased mobility;Decreased safety awareness;Cardiopulmonary status limiting activity  PT Treatment Interventions DME instruction;Gait training;Stair training;Functional mobility training;Therapeutic activities;Therapeutic  exercise;Balance training;Patient/family education   PT Goals (Current goals can be found in the Care Plan section) Acute Rehab PT Goals Patient Stated Goal: Get well, get rid of this and don't come back PT Goal Formulation: With patient/family Time For Goal Achievement: 03/14/16 Potential to Achieve Goals: Good    Frequency Min 3X/week   Barriers to discharge        Co-evaluation               End of Session Equipment Utilized During Treatment: Gait belt;Oxygen Activity Tolerance: Patient tolerated treatment well Patient left: in chair;with call bell/phone within reach;with family/visitor present;with chair alarm set Nurse Communication: Mobility status         Time: 1340-1356 PT Time Calculation (min) (ACUTE ONLY): 16 min   Charges:   PT Evaluation $PT Eval Moderate Complexity: 1 Procedure     PT G CodesDuncan Dull 03-03-2016, 4:47 PM  Alben Deeds, [place] DPT  [phone]

## 2016-03-01 DIAGNOSIS — J9601 Acute respiratory failure with hypoxia: Secondary | ICD-10-CM

## 2016-03-01 DIAGNOSIS — J441 Chronic obstructive pulmonary disease with (acute) exacerbation: Secondary | ICD-10-CM

## 2016-03-01 DIAGNOSIS — I482 Chronic atrial fibrillation: Secondary | ICD-10-CM

## 2016-03-01 DIAGNOSIS — I429 Cardiomyopathy, unspecified: Secondary | ICD-10-CM

## 2016-03-01 LAB — BASIC METABOLIC PANEL
ANION GAP: 11 (ref 5–15)
BUN: 58 mg/dL — ABNORMAL HIGH (ref 6–20)
CHLORIDE: 96 mmol/L — AB (ref 101–111)
CO2: 27 mmol/L (ref 22–32)
Calcium: 9.1 mg/dL (ref 8.9–10.3)
Creatinine, Ser: 1.89 mg/dL — ABNORMAL HIGH (ref 0.44–1.00)
GFR, EST AFRICAN AMERICAN: 27 mL/min — AB (ref >60)
GFR, EST NON AFRICAN AMERICAN: 23 mL/min — AB (ref >60)
Glucose, Bld: 115 mg/dL — ABNORMAL HIGH (ref 65–99)
POTASSIUM: 5.4 mmol/L — AB (ref 3.5–5.1)
SODIUM: 134 mmol/L — AB (ref 135–145)

## 2016-03-01 LAB — URINALYSIS, ROUTINE W REFLEX MICROSCOPIC
BILIRUBIN URINE: NEGATIVE
Glucose, UA: NEGATIVE mg/dL
Hgb urine dipstick: NEGATIVE
Ketones, ur: NEGATIVE mg/dL
Leukocytes, UA: NEGATIVE
NITRITE: NEGATIVE
PROTEIN: NEGATIVE mg/dL
SPECIFIC GRAVITY, URINE: 1.015 (ref 1.005–1.030)
pH: 5 (ref 5.0–8.0)

## 2016-03-01 LAB — CBC
HCT: 28.1 % — ABNORMAL LOW (ref 36.0–46.0)
HEMOGLOBIN: 9.3 g/dL — AB (ref 12.0–15.0)
MCH: 31.7 pg (ref 26.0–34.0)
MCHC: 33.1 g/dL (ref 30.0–36.0)
MCV: 95.9 fL (ref 78.0–100.0)
PLATELETS: 151 10*3/uL (ref 150–400)
RBC: 2.93 MIL/uL — AB (ref 3.87–5.11)
RDW: 15.4 % (ref 11.5–15.5)
WBC: 5.9 10*3/uL (ref 4.0–10.5)

## 2016-03-01 LAB — POTASSIUM: POTASSIUM: 4.1 mmol/L (ref 3.5–5.1)

## 2016-03-01 NOTE — Progress Notes (Signed)
PROGRESS NOTE  Angela Franco [id number] DOB: 09-22-1929 DOA: 02/28/2016 PCP: No PCP Per Patient   82 y/o ? Recent admission [facility], 2/16-2/22 2017 with RSV pneumonia and exacerbation bimodal CHF exacerbation Trigeminal neuralgia followed by Dr. Krista Blue on gabapentin 600 3 times a day-previously on Tegretol (stopped because of hyponatremia). Known history of atrial fibrillation, CHad2Vasc2 score=3-4 (prior warfarin-induced coagulopathy with uncontrolled INRs)  chr LBBB, Prior ischemic cardiomyopathy with improvement on last echo EF 55-60% 01/26/16  Moderate to severe pulmonary hypertension based on PA pressures 05/2012---> last echo 33 mmHg CKD stgIII Osteopenia Iron deficiency anemia with apparent recent GI workup for anemia   Admitted from the emergency room at [facility] 02/28/16 after above noted hospitalization. Noted to have hypoxic respiratory failure on admission, probably superimposed on COPD exacerbation    Feeling fair. Seems to get a little anxious When she stands up she is fearful of losing her breath She has no chest pain She has no lower extremity swelling She has no diarrhea She has no fever She has no chills   Acute on chronic hypoxic respiratory failure,  + influenza B, with diffuse wheezing on presentation, no wheezing on today's exam. No official diagnosis of copd, no prior smoking history, but cxr consistent with emphysema.  per chart review patient has been admitted for influenza A in 1/207, then RSV pna on 01/2016, now back with wheezing again and tested + for influenza B, started tamiflu (renal dosing), continue nebs. Wean oxygen, ( reported baseline on home o2 prn)   COPD exacerbation? Diagnosis in early 2013 + emphysema; Never formally diagnosed   -I spoke to the daughter for a long time on 03/01/16 and it appears there hasn't been no formal diagnosis and that patient has been suffering with chronic respiratory issues now for the past 6 months.  Cxr 02/22/16  showed cardiomegaly without any findings consistent with emphysema.  cxr 3/27 showed hyperinflated lungs with a suggestion of emphysema? 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 IPF would explain underlying lung issues which resolved with steroid use -Given her creatinine is trending down from 2.0-1.8, I would hesitate to get a high-resolution CT scan at present -We will ask for PFTs here in the hospital in the next 24-48 hours if she does not improve or we will consult pulmonary -Also considering autoimmune panel to rule out other more rare causes  Hypertension.  Blood pressure- low end of normal on admission.  Home medications include Coreg  6.25 on 3/29, Lasix, lisinopril. Systolic blood pressure in the 90s on admission -continue coreg, holding currently home dose lasix 80 daily and lisinopril 10 daily   Nonischemic cardiomyopathy. BNP within the limits of normal Recent cath reveals EF of 35-45% with grade 1 diastolic dysfunction. Appears compensated -currently euvolemic without JVD 03/01/16-see above discussion regarding medications  On chronic kidney disease stage III. Chart review indicates baseline creatinine 1.3-1.4 range. Creatinine on admission 1.7 -cr mildly elevated from baseline--trending now to normal 1.8 on 3/29 ,-Recheck in the morning  A. Fib CHADvasc score 5. Currently rate controlled. On Eliquis -Continue Eliquis and coreg -Cardiology consulted to assist with bursts of tachycardia along with baseline low normal heart rates -? Digoxin,? Amiodarone -Add TSH to labs for a.m.   Anemia. Normocytic. Underwent recent GI workup. History of iron deficiency anemia. Hemoglobin 10.1 on admission. Chart review indicates this is close to her baseline. -Continue iron  supplement -Monitor   Code Status: DNR  Family Communication: patient   Disposition Plan:  pending   Consultants:  none  Procedures:  none  Antibiotics:  doxycyclin    Objective: BP 127/76 mmHg  Pulse 91  Temp(Src) 97.9 F (36.6 C) (Oral)  Resp 18  Ht 5\' 3"  (1.6 m)  Wt 55.52 kg (122 lb 6.4 oz)  BMI 21.69 kg/m2  SpO2 96%  Intake/Output Summary (Last 24 hours) at 03/01/16 1240 Last data filed at 03/01/16 0900  Gross per 24 hour  Intake    940 ml  Output   1301 ml  Net   -361 ml   Filed Weights   02/28/16 1813 02/29/16 0607 03/01/16 0534  Weight: 57.2 kg (126 lb 1.7 oz) 57.199 kg (126 lb 1.6 oz) 55.52 kg (122 lb 6.4 oz)    Exam:   General:  NAD  Cardiovascular: IRRR, No JVD  Respiratory: CTABL  Abdomen: Soft/ND/NT, positive BS  Musculoskeletal: No Edema  Neuro: aaox3  Data Reviewed: Basic Metabolic Panel:  Recent Labs Lab 02/28/16 1020 02/29/16 0439 03/01/16 0419 03/01/16 1028  NA 130* 133* 134*  --   K 4.9 4.2 5.4* 4.1  CL 95* 98* 96*  --   CO2 25 25 27   --   GLUCOSE 94 152* 115*  --   BUN 55* 55* 58*  --   CREATININE 1.78* 2.06* 1.89*  --   CALCIUM 8.8* 8.8* 9.1  --    Liver Function Tests: No results for input(s): AST, ALT, ALKPHOS, BILITOT, PROT, ALBUMIN in the last 168 hours. No results for input(s): LIPASE, AMYLASE in the last 168 hours. No results for input(s): AMMONIA in the last 168 hours. CBC:  Recent Labs Lab 02/28/16 1020 02/29/16 0439 03/01/16 0419  WBC 4.4 1.6* 5.9  NEUTROABS 1.8  --   --   HGB 10.1* 9.2* 9.3*  HCT 30.2* 29.2* 28.1*  MCV 95.6 95.7 95.9  PLT 163 143* 151   Cardiac Enzymes:   No results for input(s): CKTOTAL, CKMB, CKMBINDEX, TROPONINI in the last 168 hours. BNP (last 3 results)  Recent Labs  01/02/16 1304 01/20/16 2241 02/28/16 1020  BNP 273.0* 234.3* 48.2    ProBNP (last 3 results) No results for input(s): PROBNP in the last 8760 hours.  CBG: No results for input(s): GLUCAP in the last 168 hours.  Recent Results (from the past 240 hour(s))  MRSA PCR Screening     Status:  None   Collection Time: 02/28/16  9:53 PM  Result Value Ref Range Status   MRSA by PCR NEGATIVE NEGATIVE Final    Comment:        The GeneXpert MRSA Assay (FDA approved for NASAL specimens only), is one component of a comprehensive MRSA colonization surveillance program. It is not intended to diagnose MRSA infection nor to guide or monitor treatment for MRSA infections.      Studies: No results found.  Scheduled Meds: . apixaban  2.5 mg Oral BID  . calcium-vitamin D  1 tablet Oral Q breakfast  . carvedilol  6.25 mg Oral BID WC  . doxycycline  100 mg Oral Q12H  . ferrous sulfate  325 mg Oral Q breakfast  . gabapentin  600 mg Oral TID  . guaiFENesin  600 mg Oral BID  . ipratropium  0.5 mg Nebulization 3 times per day  . levalbuterol  1.25 mg Nebulization 3 times per day  . methylPREDNISolone (SOLU-MEDROL) injection  60 mg Intravenous Q12H  . oseltamivir  30 mg Oral Daily  . simvastatin  5 mg Oral q1800  . sodium chloride flush  3 mL Intravenous Q12H    Continuous Infusions:    Time spent: 30mins  Verneita Griffes, MD [facility] [phone]

## 2016-03-01 NOTE — Progress Notes (Signed)
Occupational Therapy Treatment Patient Details Name: Carlinda Tape MRN: [id number] DOB: 1929/02/04 Today's Date: 03/01/2016    History of present illness Gethsemane Hert is a 82 y.o. female with a Past Medical History of COPD, Afib, hyponatremia, HTN, trigeminal neuralgia, CHF, LBBB, MI, CKD who presents with acute respiratory failure from COPD exacerbation. +Flu.   OT comments  Pt seen for OT ADL retraining session to include instruction/education in Energy Conservation techniques. Handout was issued & reviewed with pt. Stressed breathing techniques, bathing and dressing techniques/suggestions, rest breaks PRN, breaking down activities and general home making tasks within her apartment. Pt benefits from vc's for proper breathing techniques as she is noted to rush at times. Pt verbalized understanding but will need re-enforcement to implement during ADL tasks. Cont to follow acutely.     Follow Up Recommendations  Home health OT;Supervision - Intermittent    Equipment Recommendations  None recommended by OT (Pt currently reports that she has DME & no needs)    Recommendations for Other Services      Precautions / Restrictions Precautions Precautions: Fall;Other (comment) Precaution Comments: Droplet, + Flu per MD report; monitor HR Restrictions Weight Bearing Restrictions: No       Mobility Bed Mobility Overal bed mobility:  (Pt supine in bed during OT treatment session today for pt education/energy conservation tech's)                Transfers                      Balance                                   ADL Overall ADL's : Needs assistance/impaired                                       General ADL Comments: Pt seen for OT ADL retraining session to include Energy Conservation techniques and instruction/education. Handout was issued and reviewed with pt. Stressed breathing techniques, bathing and dressing  techniques/suggestions, rest breaks PRN, breaking down activities and general home making tasks within her apartment. Pt benefits from vc's for proper breathing techniques as she is noted to rush at times. Pt verbalized understanding but will need re-enforcement to implement during ADL tasks. Cont to follow. HHOT for home recommendations      Vision  No change from baseline per pt report                   Perception     Praxis      Cognition   Behavior During Therapy: [facility] for tasks assessed/performed;Impulsive Overall Cognitive Status: Within Functional Limits for tasks assessed       Memory: Decreased recall of precautions (Pt tends to repeat questions and req re-enforcement of breathing techniques during functional activity)               Extremity/Trunk Assessment               Exercises     Shoulder Instructions       General Comments      Pertinent Vitals/ Pain       Pain Assessment: No/denies pain  Home Living  Prior Functioning/Environment              Frequency Min 2X/week     Progress Toward Goals  OT Goals(current goals can now be found in the care plan section)  Progress towards OT goals: Progressing toward goals  Acute Rehab OT Goals Patient Stated Goal: Get well, get rid of this and don't come back  Plan Discharge plan remains appropriate    Co-evaluation                 End of Session Equipment Utilized During Treatment: Oxygen   Activity Tolerance Patient tolerated treatment well   Patient Left in bed;with call bell/phone within reach   Nurse Communication          Time: [id number] OT Time Calculation (min): 23 min  Charges: OT General Charges $OT Visit: 1 Procedure OT Treatments $Self Care/Home Management : 23-37 mins  [place], Amy Beth Dixon, OTR/L 03/01/2016, 9:13 AM

## 2016-03-01 NOTE — Progress Notes (Signed)
Physical Therapy Treatment Patient Details Name: Angela Franco MRN: [id number] DOB: 1929/05/19 Today's Date: 03/01/2016    History of Present Illness Angela Franco is a 82 y.o. female with a Past Medical History of COPD, Afib, hyponatremia, HTN, trigeminal neuralgia, CHF, LBBB, MI, CKD who presents with acute respiratory failure from COPD exacerbation. +Flu.    PT Comments    Patient seen for mobility an ambulation with O2 saturation assessment. ambulated on 3 liters [place] with saturations remaining >95% throughout ambulation, no significant DOE, HR afib elevated to 140s. Extended seated rest break then performed ambulation again on room air with saturations dropping to 91% on room air and HR elevating to Afib upper 170s. Increased DOE noted 3/4.    Follow Up Recommendations  Home health PT;Supervision for mobility/OOB     Equipment Recommendations  None recommended by PT    Recommendations for Other Services       Precautions / Restrictions Precautions Precautions: Fall;Other (comment) Precaution Comments: Droplet, + Flu per MD report; monitor HR Restrictions Weight Bearing Restrictions: No    Mobility  Bed Mobility Overal bed mobility: Modified Independent             General bed mobility comments: no physical assist required, increased time to perform  Transfers Overall transfer level: Needs assistance Equipment used: Rolling walker (2 wheeled) Transfers: Sit to/from Stand Sit to Stand: Supervision Stand pivot transfers: Min guard       General transfer comment: VCs for hand placement and safety with mobility. Cues for use of RW instead of furniture to stand  Ambulation/Gait Ambulation/Gait assistance: Supervision Ambulation Distance (Feet): 180 Feet (x2) Assistive device: Rolling walker (2 wheeled) Gait Pattern/deviations: WFL(Within Functional Limits) Gait velocity: impulsive   General Gait Details: ambulated on 3 liters [place] with saturations remaining  >95% throughout ambulation, no significant DOE, HR afib elevated to 140s. Extended seated rest break then performed ambulation again on room air with saturations dropping to 91% on room air and HR elevating to Afib upper 170s. Increased DOE noted 3/4.    Stairs            Wheelchair Mobility    Modified Rankin (Stroke Patients Only)       Balance     Sitting balance-Leahy Scale: Good       Standing balance-Leahy Scale: Fair                      Cognition Arousal/Alertness: Awake/alert Behavior During Therapy: WFL for tasks assessed/performed;Impulsive Overall Cognitive Status: Within Functional Limits for tasks assessed                      Exercises      General Comments        Pertinent Vitals/Pain Pain Assessment: No/denies pain    Home Living                      Prior Function            PT Goals (current goals can now be found in the care plan section) Acute Rehab PT Goals Patient Stated Goal: Get well, get rid of this and don't come back PT Goal Formulation: With patient/family Time For Goal Achievement: 03/14/16 Potential to Achieve Goals: Good Progress towards PT goals: Progressing toward goals    Frequency  Min 3X/week    PT Plan Current plan remains appropriate    Co-evaluation  End of Session Equipment Utilized During Treatment: Gait belt;Oxygen Activity Tolerance: Patient tolerated treatment well Patient left: in chair;with call bell/phone within reach;with family/visitor present;with chair alarm set     Time: [id number] PT Time Calculation (min) (ACUTE ONLY): 25 min  Charges:  $Gait Training: 23-37 mins                    G CodesDuncan Dull 2016/03/21, 3:09 PM Alben Deeds, [place] DPT  [phone]

## 2016-03-01 NOTE — Consult Note (Signed)
   [facility] Inpatient Consult   03/01/2016  Angela Franco January 28, 1929 [id number] Thank you for this consult:   Patient evaluated for [place] Management services.  Patient is not eligible for [facility] Care Management services because she does not have  a [facility] primary care provider or is not [facility] affiliated.   This patient is Not eligible for [facility] Care Management Services.   Reason:  This patient is currently not a beneficiary  attributed to one of the [place].  Membership roster was used to verify non- eligible status. Natividad Brood, RN BSN [facility] Liaison  [phone] business mobile phone Toll free office [phone]

## 2016-03-01 NOTE — Progress Notes (Signed)
Pt's HR down to the 30's non-sustaining, but frequently. Will hold AM Coreg and continue to monitor.

## 2016-03-01 NOTE — Consult Note (Signed)
CARDIOLOGY CONSULT NOTE   Patient ID: Angela Franco MRN: [id number], DOB/AGE: 1928-12-13   Admit date: 02/28/2016 Date of Consult: 03/01/2016   Primary Physician: No PCP Per Patient Primary Cardiologist: Dr. Debara Pickett  Pt. Profile  Angela Franco is a pleasant 82 year old Caucasian female with PMH of chronic afib on eliquis, COPD with recurrent viral URI, LBBB, CKD, NICM with now improved EF, h/o hyponatremia 2/2 HF, HTN, HLD and trigeminal neuralgia presented with recurrent acute respiratory failure secondary to influenza  Problem List  Past Medical History  Diagnosis Date  . COPD (chronic obstructive pulmonary disease) ([place])     Never smoked. Questionable diagnosis.  . Atrial fibrillation ([place])   . Osteopenia   . Insomnia   . Hyponatremia   . Hypertension   . UTI (lower urinary tract infection)   . Bursitis of hip   . Trigeminal neuralgia   . Altered mental status 08/24/2012  . Anemia, with significant drop in H/H 08/24/2012  . CHF (congestive heart failure) ([place])   . Heart disease   . Acute facial pain   . LBBB (left bundle branch block)   . Nonischemic cardiomyopathy ([place])   . Dyslipidemia   . Kidney failure     Stage 3  . Skin cancer of nose   . Complication of anesthesia     "I'm slow to recover when I'm put to sleep"  . On home oxygen therapy     "2L at night and prn during the day" (02/28/2016)  . Arthritis     "fingers" (02/28/2016)    Past Surgical History  Procedure Laterality Date  . Appendectomy    . Cardiac catheterization  07/04/2012    normal coronaries, EF 30-35%, elevated R & L heart pressures, reduced CO, mild pulm HTN (Dr. K. Mali Hilty)  . Transthoracic echocardiogram  2013    mild conc LVH, RV mildly dilated; LA severely dilated; RA mod dilated; mild-mod mitral annular calcif, calcif of anterior & posterior MV leaflet, mod MR; mod-severe TR with RSVP 30-6mmHg; mild calcif of AV leaflets and mod aortic regurg; mild pulm valve regurg; pericardial  effusion (right)  . Nm myocar perf wall motion  2011    lexiscan myoview - normal LV systolic function w/normal wall motion, no evidence of scar/ischemia  . Left and right heart catheterization with coronary angiogram N/A 07/04/2012    Procedure: LEFT AND RIGHT HEART CATHETERIZATION WITH CORONARY ANGIOGRAM;  Surgeon: Pixie Casino, MD;  Location: [facility] CATH LAB;  Service: Cardiovascular;  Laterality: N/A;  . Tonsillectomy and adenoidectomy    . Breast surgery Right     "took out some nodes"  . Skin cancer excision      "nose"  . Dilation and curettage of uterus    . Cataract extraction w/ intraocular lens  implant, bilateral Bilateral      Allergies  Allergies  Allergen Reactions  . Penicillins Hives    Has patient had a PCN reaction causing immediate rash, facial/tongue/throat swelling, SOB or lightheadedness with hypotension: No Has patient had a PCN reaction causing severe rash involving mucus membranes or skin necrosis: No Has patient had a PCN reaction that required hospitalization No Has patient had a PCN reaction occurring within the last 10 years: No If all of the above answers are "NO", then may proceed with Cephalosporin use.    HPI   Angela Franco is a pleasant 82 year old Caucasian female with PMH of chronic afib on eliquis, COPD with recurrent viral URI, LBBB, CKD, NICM  with now improved EF, h/o hyponatremia 2/2 HF, HTN, HLD and trigeminal neuralgia. It did not appear she has any history of CAD, she had a cardiac catheterization on 07/04/2012 which showed EF 30-35%, global hypokinesis, 1+ MR, angiographically normal coronaries, LVEDP 21 mmHg, wedge pressure 23, cardiac index 2.32, cardiac output 3.55. She has had 4 admissions in the last 6 months for acute respiratory failure. Previous echocardiogram in April 2016 has shown that her EF improved to 40-45%. During one her recent hospitalization, echocardiogram was repeated on 01/26/2016 which shows EF 55-60%, no regional wall motion  abnormality, mild AR, mild MR, mild TR, moderately dilated RV with moderately reduced RV EF, peak PA pressure 33 mmHg. Despite multiple admissions, it does not appear patient has used any home nebulizer or need any long-term prednisone therapy.  She is currently living in independent living facility, [facility]. She is largely independent and has been able to take care of herself. Her daughter manage her medication. She has been compliant with her medication at home. After her recent admission in February 2017 for combined CHF exacerbation secondary to RSV PNA. It was felt without PFTs or COPD risk factors, COPD he is felt to be less likely. She was initially doing well after discharge. Because of loss of appetite, her weight has been actually decreasing. She was seen by Dr. Debara Pickett in the office on 02/16/2016, at which time she was doing well with weight 131 pounds. Which was slightly higher than her previous discharge weight in February which was 127 pounds. Since last Tuesday, she began to have chest congestion. She denies any chest pain or lower extremity edema, however shortness of breath has been worsening. Her PCP gave her some antibiotic which did not help. She does not have nebulizer or steroid at home and her condition continued to deteriorate until Sunday when the family decided to seek medical attention.  Upon arrival, she was afebrile however has intermittent hypoxia, she was given nebulizer and Solu-Medrol. She was tested positive for influenza B. Her Lasix and lisinopril were held. Overnight, patient had some bradycardia down to the 40s to 50s as well as tachycardia up to 140s. Cardiology has been consulted for evaluation of potential tachybradycardia.   Inpatient Medications  . apixaban  2.5 mg Oral BID  . calcium-vitamin D  1 tablet Oral Q breakfast  . carvedilol  6.25 mg Oral BID WC  . doxycycline  100 mg Oral Q12H  . ferrous sulfate  325 mg Oral Q breakfast  . gabapentin  600 mg  Oral TID  . guaiFENesin  600 mg Oral BID  . ipratropium  0.5 mg Nebulization 3 times per day  . levalbuterol  1.25 mg Nebulization 3 times per day  . methylPREDNISolone (SOLU-MEDROL) injection  60 mg Intravenous Q12H  . oseltamivir  30 mg Oral Daily  . simvastatin  5 mg Oral q1800  . sodium chloride flush  3 mL Intravenous Q12H    Family History Family History  Problem Relation Age of Onset  . Stroke Father   . Heart attack Father   . Heart attack Brother   . Heart attack Brother   . Alzheimer's disease Mother      Social History Social History   Social History  . Marital Status: Widowed    Spouse Name: N/A  . Number of Children: 3  . Years of Education: 12   Occupational History  . Not on file.   Social History Main Topics  . Smoking status: Never  Smoker   . Smokeless tobacco: Never Used  . Alcohol Use: No  . Drug Use: No  . Sexual Activity: No   Other Topics Concern  . Not on file   Social History Narrative   Patient is widowed.    Patient has 3 children.    Patient is retired.      Review of Systems  General:  No chills, fever, night sweats or weight changes.  Cardiovascular:  No chest pain, edema, orthopnea, palpitations, paroxysmal nocturnal dyspnea. +dyspnea on exertion Dermatological: No rash, lesions/masses Respiratory: +cough, dyspnea Urologic: No hematuria, dysuria Abdominal:   No nausea, vomiting, diarrhea, bright red blood per rectum, melena, or hematemesis Neurologic:  No visual changes, wkns, changes in mental status. All other systems reviewed and are otherwise negative except as noted above.  Physical Exam  Blood pressure 136/66, pulse 66, temperature 97.9 F (36.6 C), temperature source Oral, resp. rate 18, height 5\' 3"  (1.6 m), weight 122 lb 6.4 oz (55.52 kg), SpO2 95 %.  General: Pleasant, NAD Psych: Normal affect. Neuro: Alert and oriented X 3. Moves all extremities spontaneously. HEENT: Normal  Neck: Supple without bruits or  JVD. Lungs:  Resp regular and unlabored. Mild expiratory wheezing with prolonged expiratory phase, markedly diminished breath sounds at bilateral bases, however no radial or rhonchi Heart: RRR no s3, s4, or murmurs. Abdomen: Soft, non-tender, non-distended, BS + x 4.  Extremities: No clubbing, cyanosis or edema. DP/PT/Radials 2+ and equal bilaterally.  Labs  No results for input(s): CKTOTAL, CKMB, TROPONINI in the last 72 hours. Lab Results  Component Value Date   WBC 5.9 03/01/2016   HGB 9.3* 03/01/2016   HCT 28.1* 03/01/2016   MCV 95.9 03/01/2016   PLT 151 03/01/2016    Recent Labs Lab 03/01/16 0419 03/01/16 1028  NA 134*  --   K 5.4* 4.1  CL 96*  --   CO2 27  --   BUN 58*  --   CREATININE 1.89*  --   CALCIUM 9.1  --   GLUCOSE 115*  --    Lab Results  Component Value Date   CHOL 157 08/24/2012   HDL 51 08/24/2012   LDLCALC 96 08/24/2012   TRIG 51 08/24/2012   No results found for: DDIMER  Radiology/Studies  Dg Chest 2 View  02/28/2016  CLINICAL DATA:  Shortness of breath, cough and hoarseness. EXAM: CHEST  2 VIEW COMPARISON:  01/20/2016 FINDINGS: There is chronic cardiomegaly. Pulmonary vascularity is normal. No infiltrates or effusions. Lungs are hyperinflated consistent with emphysema. No acute osseous abnormality. IMPRESSION: Chronic cardiomegaly. Hyperinflated lungs consistent with emphysema. Electronically Signed   By: Lorriane Shire M.D.   On: 02/28/2016 10:32   Dg Chest 2 View  02/22/2016  CLINICAL DATA:  Productive cough, body aches, and fever. EXAM: CHEST  2 VIEW COMPARISON:  None. FINDINGS: The cardiac silhouette is mildly to moderately enlarged. Thoracic aortic atherosclerosis is noted. The lungs are hyperinflated. There is mild biapical scarring. No confluent airspace opacity, edema, pleural effusion, or pneumothorax is identified. No acute osseous abnormality is seen. IMPRESSION: Cardiomegaly and hyperinflation without evidence of acute cardiopulmonary  process. Electronically Signed   By: Logan Bores M.D.   On: 02/22/2016 18:22    ECG    ASSESSMENT AND PLAN  1. Labile HR   - May not be true tachybradycardia syndrome, discussed with the nursing staff, it appears most of the time her heart rate is very well controlled in atrial fibrillation, however the moment she  stand up her heart rate would shoot up to 140s. We ambulated her in the hallway and her HR as expected went up drastically. Since this is reproducible, doubt tachybrady  - Either way, her tachycardia may be related to her breathing issues, she has markedly diminished breath sounds at the bilateral bases, I do not think she is fluid overloaded, she may benefit from PRN nebulizer and prednisone on going home as this is the fourth admission for acute respiratory issue. I think some home nebulizer and prednisone may help decrease admission rate.  - Patient denies any recent dizziness or feeling of passing out, despite occasional heart rate into the 40s, this small lady does not have any symptom at all, would not expect any need for pacemaker at this time.  2. Acute resp failure likely related to influenza B, will benefit from outpatient PFT as physical exam shows expiratory wheezing with prolonged expiratory phase concerning for obstructive lung disease, this can be done as outpatient once she recover from influenza  3. Chronic afib on eliquis  - CHA2DS2-Vasc score 4 (HF, age, HTN)   4. COPD with recurrent viral URI  5. CKD  6. NICM with now improved EF:   - Echo 01/2016 EF 55-60%, no regional wall motion abnormality, mild AR, mild MR, mild TR, moderately dilated RV with moderately reduced RV EF, peak PA pressure 33 mmHg  - Based on recent echocardiogram, she has moderately decreased RV EF, she is more prone to RV heart failure and LV heart failure, despite this, she does not have significant lower extremity edema. Again no sign of heart failure on exam.   Signed, Almyra Deforest,  PA-C 03/01/2016, 1:59 PM  Personally seen and examined. Agree with above. With ambulation HR increases to 140. At rest 60's. AFIB has been persistent for several years. I would advocate continued use of coreg 6.25 BID (missed dose this AM) and hopefully as her lungs improve (noted work of breathing on exam) her AFIB will become more manageable. I would not pursue pacemaker. HR in the past has been 50's. LBBB. Avoid amiodarone (low change of conversion).  Asymptomatic with concern to AFIB, bradycardia.   Please let us know if we can be of further assistance. Will sign off.   Candee Furbish, MD

## 2016-03-02 LAB — BASIC METABOLIC PANEL
Anion gap: 11 (ref 5–15)
BUN: 60 mg/dL — ABNORMAL HIGH (ref 6–20)
CALCIUM: 9.1 mg/dL (ref 8.9–10.3)
CO2: 23 mmol/L (ref 22–32)
CREATININE: 1.84 mg/dL — AB (ref 0.44–1.00)
Chloride: 99 mmol/L — ABNORMAL LOW (ref 101–111)
GFR, EST AFRICAN AMERICAN: 28 mL/min — AB (ref >60)
GFR, EST NON AFRICAN AMERICAN: 24 mL/min — AB (ref >60)
Glucose, Bld: 117 mg/dL — ABNORMAL HIGH (ref 65–99)
Potassium: 4.3 mmol/L (ref 3.5–5.1)
SODIUM: 133 mmol/L — AB (ref 135–145)

## 2016-03-02 MED ORDER — LISINOPRIL 10 MG PO TABS: 5.0000 mg | ORAL_TABLET | Freq: Every day | ORAL | Status: DC

## 2016-03-02 MED ORDER — PREDNISONE 5 MG PO TABS: 5.0000 mg | ORAL_TABLET | Freq: Every day | ORAL | Status: DC

## 2016-03-02 MED ORDER — OSELTAMIVIR PHOSPHATE 30 MG PO CAPS: 30.0000 mg | ORAL_CAPSULE | Freq: Every day | ORAL | Status: DC

## 2016-03-02 MED ORDER — OXYCODONE HCL 5 MG PO TABS
2.5000 mg | ORAL_TABLET | Freq: Four times a day (QID) | ORAL | Status: DC | PRN
  Administered 2016-03-02: 2.5 mg via ORAL
  Filled 2016-03-02: qty 1

## 2016-03-02 MED ORDER — PREDNISONE 20 MG PO TABS: 40.0000 mg | ORAL_TABLET | Freq: Every day | ORAL | Status: DC

## 2016-03-02 MED ORDER — PREDNISONE 20 MG PO TABS: 20.0000 mg | ORAL_TABLET | Freq: Every day | ORAL | Status: DC

## 2016-03-02 MED ORDER — PREDNISONE 50 MG PO TABS: ORAL_TABLET | ORAL | Status: DC

## 2016-03-02 MED ORDER — DOXYCYCLINE HYCLATE 100 MG PO TABS: 100.0000 mg | ORAL_TABLET | Freq: Two times a day (BID) | ORAL | Status: DC

## 2016-03-02 NOTE — Discharge Summary (Signed)
Physician Discharge Summary  Angela Franco [id number] DOB: Dec 31, 1928 DOA: 02/28/2016  PCP: No PCP Per Patient  Admit date: 02/28/2016 Discharge date: 03/02/2016  Time spent: 45 minutes  Recommendations for Outpatient Follow-up:  1. Complete doxycycline and tamiflu 2. Prednisone taper as below 3. OP follow up to be scheduled to with Dr. Aaron Edelman [facility] or IPF work-up  Discharge Diagnoses:  Principal Problem:   Acute respiratory failure with hypoxia [facility]) Active Problems:   Normocytic anemia   Atrial fibrillation, chronic (HCC)   Moderate to severe pulmonary hypertension, PA pressure in 70s June 2013   Non-ischemic cardiomyopathy EF improved from 30-35% to 40-45% by Echo in 07/2012   HTN (hypertension)   COPD exacerbation ([place])   Acute renal failure superimposed on stage 3 chronic kidney disease ([place])   Discharge Condition: good  Diet recommendation: hh  Filed Weights   02/29/16 0607 03/01/16 0534 03/02/16 0511  Weight: 57.199 kg (126 lb 1.6 oz) 55.52 kg (122 lb 6.4 oz) 57.607 kg (127 lb)    History of present illness: 82 y/o ? Recent admission [facility], 2/16-2/22 2017 with RSV pneumonia and exacerbation bimodal CHF exacerbation Trigeminal neuralgia followed by Dr. Krista Blue on gabapentin 600 3 times a day-previously on Tegretol (stopped because of hyponatremia). Known history of atrial fibrillation, CHad2Vasc2 score=3-4 (prior warfarin-induced coagulopathy with uncontrolled INRs)  chr LBBB, Prior ischemic cardiomyopathy with improvement on last echo EF 55-60% 01/26/16   Moderate to severe pulmonary hypertension based on PA pressures 05/2012---> last echo 33 mmHg CKD stgIII Osteopenia Iron deficiency anemia with apparent recent GI workup for anemia   Admitted from the emergency room at [facility] 02/28/16 after above noted hospitalization. Noted to have hypoxic respiratory failure on admission, probably superimposed on COPD exacerbation   Acute on chronic hypoxic  respiratory failure,   + influenza B, with diffuse wheezing on presentation, no wheezing on today's exam. No official diagnosis of copd, no prior smoking history, but cxr consistent with emphysema.   per chart review patient has been admitted for influenza A in 1/207, then RSV pna on 01/2016, now back with wheezing again and tested + for influenza B, started tamiflu (renal dosing), continue nebs. Wean oxygen, ( reported baseline on home o2 prn)   COPD exacerbation? Diagnosis in early 2013 + emphysema; Never formally diagnosed    -I spoke to the daughter for a long time on 03/01/16 and it appears there hasn't been no formal diagnosis and that patient has been suffering with chronic respiratory issues now for the past 6 months.  Cxr 02/22/16 showed cardiomegaly without any findings consistent with emphysema.   cxr 3/27 showed hyperinflated lungs with a suggestion of emphysema? 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 IPF would explain underlying lung issues which resolved with steroid use -Given her creatinine is trending down from 2.0-1.8, I would hesitate to get a high-resolution CT scan at present -We will ask for PFTs/pulmonary follow up as OP  Hypertension.   Blood pressure- low end of normal on admission.   Home medications include Coreg  6.25 on 3/29, Lasix, lisinopril. Systolic blood pressure in the 90s on admission -continue coreg, holding currently home dose lasix 80 daily and  lisinopril 10 daily was switched to 5 mg daily on d/c   Nonischemic cardiomyopathy. BNP within the limits of normal Recent cath reveals EF of 35-45% with grade 1 diastolic dysfunction. Appears compensated -currently euvolemic  without JVD 03/01/16-see above discussion regarding medications  On chronic kidney disease stage III. Chart review indicates baseline creatinine 1.3-1.4 range. Creatinine on admission 1.7 -cr  mildly elevated from baseline--trending now to normal 1.8 on 3/30  A. Fib CHADvasc score 5. Currently rate controlled. On Eliquis -Continue Eliquis and coreg -Cardiology consulted to and felt not tachy-brady syndrome   Anemia. Normocytic. Underwent recent GI workup. History of iron deficiency anemia. Hemoglobin 10.1 on admission. Chart review indicates this is close to her baseline. -Continue iron supplement -Monitor   Procedures: Echo Consultations:  Cardiology  Discharge Exam: Filed Vitals:   03/02/16 0511 03/02/16 1227  BP: 141/73 144/95  Pulse: 71 85  Temp: 97.6 F (36.4 C) 97.5 F (36.4 C)  Resp: 18 18    General: eomi ncat Cardiovascular: s1 s 2 no m/r/g Respiratory: clear no added sound  Discharge Instructions   Discharge Instructions    Diet - low sodium heart healthy    Complete by:  As directed      Discharge instructions    Complete by:  As directed   YOu will notice some of your medicaitons might have changed- follow the instructions about the prednisone carefully. CHekc the dose of lisinopril before you take it Get lab work in 1 week at the Assisted living facility We will give you the name of a good Pulmonary MD s     Increase activity slowly    Complete by:  As directed           Current Discharge Medication List    START taking these medications   Details  doxycycline (VIBRA-TABS) 100 MG tablet Take 1 tablet (100 mg total) by mouth every 12 (twelve) hours. Qty: 2 tablet, Refills: 0    oseltamivir (TAMIFLU) 30 MG capsule Take 1 capsule (30 mg total) by mouth daily. Qty: 4 capsule, Refills: 0    !! predniSONE (DELTASONE) 20 MG tablet Take 2 tablets (40 mg total) by mouth daily with breakfast. Qty: 14 tablet, Refills: 0    !! predniSONE (DELTASONE) 20 MG tablet Take 1 tablet (20 mg total) by mouth daily with breakfast. Qty: 7 tablet, Refills: 0    !! predniSONE (DELTASONE) 5 MG tablet Take 1 tablet (5 mg total) by mouth daily with  breakfast. Qty: 30 tablet, Refills: 0    !! predniSONE (DELTASONE) 50 MG tablet Take 1 tablet daly for 1 week Qty: 7 tablet, Refills: 0     !! - Potential duplicate medications found. Please discuss with provider.    CONTINUE these medications which have CHANGED   Details  lisinopril (PRINIVIL,ZESTRIL) 10 MG tablet Take 0.5 tablets (5 mg total) by mouth daily. Qty: 30 tablet, Refills: 6      CONTINUE these medications which have NOT CHANGED   Details  apixaban (ELIQUIS) 2.5 MG TABS tablet Take 1 tablet (2.5 mg total) by mouth 2 (two) times daily. Qty: 60 tablet, Refills: 11    calcium-vitamin D (OSCAL WITH D) 500-200 MG-UNIT tablet Take 1 tablet by mouth daily with breakfast.    carvedilol (COREG) 6.25 MG tablet Take 1 tablet (6.25 mg total) by mouth 2 (two) times daily with a meal. Qty: 60 tablet, Refills: 3    ferrous sulfate 325 (65 FE) MG tablet Take 325 mg by mouth daily with breakfast.    furosemide (LASIX) 80 MG tablet Take 1 tablet (80 mg total) by mouth daily. Qty: 30 tablet, Refills: 11    gabapentin (NEURONTIN) 600 MG tablet Take 1  tablet (600 mg total) by mouth 3 (three) times daily. Please dose at 8am, 2pm and 8pm Qty: 270 tablet, Refills: 3    Melatonin 10 MG TABS Take 10 mg by mouth daily.    potassium chloride SA (K-DUR,KLOR-CON) 20 MEQ tablet Take 20 mEq by mouth daily.    simvastatin (ZOCOR) 10 MG tablet Take 0.5 tablets (5 mg total) by mouth daily at 6 PM. Qty: 45 tablet, Refills: 3    docusate sodium 100 MG CAPS Take 200 mg by mouth at bedtime as needed. Qty: 60 capsule, Refills: 0    feeding supplement (BOOST / RESOURCE BREEZE) LIQD Take 1 Container by mouth 3 (three) times daily between meals. Qty: 237 mL, Refills: 0    Guaifenesin (MUCINEX MAXIMUM STRENGTH) 1200 MG TB12 Take 1 tablet (1,200 mg total) by mouth every 12 (twelve) hours as needed. Qty: 14 tablet, Refills: 1   Associated Diagnoses: Flu-like symptoms    guaiFENesin-dextromethorphan  (ROBITUSSIN DM) 100-10 MG/5ML syrup Take 5 mLs by mouth every 4 (four) hours. Qty: 118 mL, Refills: 0    ipratropium-albuterol (DUONEB) 0.5-2.5 (3) MG/3ML SOLN Take 3 mLs by nebulization every 6 (six) hours as needed. Qty: 360 mL, Refills: 12    senna-docusate (SENOKOT-S) 8.6-50 MG tablet Take 1 tablet by mouth 2 (two) times daily. Qty: 60 tablet, Refills: 0       Allergies  Allergen Reactions  . Penicillins Hives    Has patient had a PCN reaction causing immediate rash, facial/tongue/throat swelling, SOB or lightheadedness with hypotension: No Has patient had a PCN reaction causing severe rash involving mucus membranes or skin necrosis: No Has patient had a PCN reaction that required hospitalization No Has patient had a PCN reaction occurring within the last 10 years: No If all of the above answers are "NO", then may proceed with Cephalosporin use.   Follow-up Information    Follow up with [facility], MD In 1 month.   Specialty:  Pulmonary Disease   Why:  new patient to Dr. Wynetta Fines 30 min visit   Contact information:   [address] [address] [phone]        The results of significant diagnostics from this hospitalization (including imaging, microbiology, ancillary and laboratory) are listed below for reference.    Significant Diagnostic Studies: Dg Chest 2 View  02/28/2016  CLINICAL DATA:  Shortness of breath, cough and hoarseness. EXAM: CHEST  2 VIEW COMPARISON:  01/20/2016 FINDINGS: There is chronic cardiomegaly. Pulmonary vascularity is normal. No infiltrates or effusions. Lungs are hyperinflated consistent with emphysema. No acute osseous abnormality. IMPRESSION: Chronic cardiomegaly. Hyperinflated lungs consistent with emphysema. Electronically Signed   By: Lorriane Shire M.D.   On: 02/28/2016 10:32   Dg Chest 2 View  02/22/2016  CLINICAL DATA:  Productive cough, body aches, and fever. EXAM: CHEST  2 VIEW COMPARISON:  None. FINDINGS: The  cardiac silhouette is mildly to moderately enlarged. Thoracic aortic atherosclerosis is noted. The lungs are hyperinflated. There is mild biapical scarring. No confluent airspace opacity, edema, pleural effusion, or pneumothorax is identified. No acute osseous abnormality is seen. IMPRESSION: Cardiomegaly and hyperinflation without evidence of acute cardiopulmonary process. Electronically Signed   By: Logan Bores M.D.   On: 02/22/2016 18:22    Microbiology: Recent Results (from the past 240 hour(s))  MRSA PCR Screening     Status: None   Collection Time: 02/28/16  9:53 PM  Result Value Ref Range Status   MRSA by PCR NEGATIVE NEGATIVE Final  Comment:        The GeneXpert MRSA Assay (FDA approved for NASAL specimens only), is one component of a comprehensive MRSA colonization surveillance program. It is not intended to diagnose MRSA infection nor to guide or monitor treatment for MRSA infections.      Labs: Basic Metabolic Panel:  Recent Labs Lab 02/28/16 1020 02/29/16 0439 03/01/16 0419 03/01/16 1028 03/02/16 0917  NA 130* 133* 134*  --  133*  K 4.9 4.2 5.4* 4.1 4.3  CL 95* 98* 96*  --  99*  CO2 25 25 27   --  23  GLUCOSE 94 152* 115*  --  117*  BUN 55* 55* 58*  --  60*  CREATININE 1.78* 2.06* 1.89*  --  1.84*  CALCIUM 8.8* 8.8* 9.1  --  9.1   Liver Function Tests: No results for input(s): AST, ALT, ALKPHOS, BILITOT, PROT, ALBUMIN in the last 168 hours. No results for input(s): LIPASE, AMYLASE in the last 168 hours. No results for input(s): AMMONIA in the last 168 hours. CBC:  Recent Labs Lab 02/28/16 1020 02/29/16 0439 03/01/16 0419  WBC 4.4 1.6* 5.9  NEUTROABS 1.8  --   --   HGB 10.1* 9.2* 9.3*  HCT 30.2* 29.2* 28.1*  MCV 95.6 95.7 95.9  PLT 163 143* 151   Cardiac Enzymes: No results for input(s): CKTOTAL, CKMB, CKMBINDEX, TROPONINI in the last 168 hours. BNP: BNP (last 3 results)  Recent Labs  01/02/16 1304 01/20/16 2241 02/28/16 1020  BNP  273.0* 234.3* 48.2    ProBNP (last 3 results) No results for input(s): PROBNP in the last 8760 hours.  CBG: No results for input(s): GLUCAP in the last 168 hours.     SignedNita Sells MD   [facility] 03/02/2016, 12:51 PM

## 2016-03-02 NOTE — Telephone Encounter (Signed)
Okay to schedule

## 2016-03-02 NOTE — Progress Notes (Signed)
Pt slept well throughout the night, Vitals stable, no any other specific complain, oxygen continue @2l /[place],  will continue to monitor the patient.

## 2016-03-09 ENCOUNTER — Telehealth: Payer: Self-pay | Admitting: Internal Medicine

## 2016-03-09 NOTE — Telephone Encounter (Signed)
Lmomtcb for Angela Franco with Angela Franco.

## 2016-03-10 NOTE — Telephone Encounter (Signed)
Angela Franco returning call wanting verbal order for pt phy therp.[name]'

## 2016-03-10 NOTE — Telephone Encounter (Signed)
Angela Franco returning call.Angela Franco

## 2016-03-10 NOTE — Telephone Encounter (Signed)
Called and spoke to [place] with [facility]. Verdis Frederickson is requesting a VO for PT for twice a week x 6 weeks. Verdis Frederickson is aware that MR is out of the office and will not return until the week of 4/16. Verdis Frederickson verbalized understanding.   MR please advise. Thanks.

## 2016-03-10 NOTE — Telephone Encounter (Signed)
Left message for Verdis Frederickson from [place] to call back.

## 2016-03-19 NOTE — Telephone Encounter (Signed)
That is fine. No problem

## 2016-03-20 ENCOUNTER — Telehealth: Payer: Self-pay | Admitting: Internal Medicine

## 2016-03-20 NOTE — Telephone Encounter (Signed)
Called spoke with Verdis Frederickson, informed her that MR okayed the PT for twice a week x6 weeks Nothing further needed; will sign off

## 2016-03-20 NOTE — Telephone Encounter (Signed)
Scott from [place] calling to get verbal orders:  Skilled nursing order for COPD monitoring. Frequency 2x a wk for 1 wk, 1x a wk for 3wks. He can be reached at [phone] authorization can be left on vm  Dr. Chase Caller, please advise.

## 2016-03-20 NOTE — Telephone Encounter (Signed)
[place] calling back from [place]

## 2016-03-20 NOTE — Telephone Encounter (Signed)
lmtcb for [employer].

## 2016-03-21 NOTE — Telephone Encounter (Signed)
LM for Scott x 1

## 2016-03-21 NOTE — Telephone Encounter (Signed)
Left detailed message on named VM giving ok for orders as requested.  Will close message.

## 2016-03-21 NOTE — Telephone Encounter (Signed)
Pt has decided to go to [place] office

## 2016-03-21 NOTE — Telephone Encounter (Signed)
That is fine 

## 2016-03-23 ENCOUNTER — Other Ambulatory Visit: Payer: Self-pay | Admitting: Internal Medicine

## 2016-03-23 NOTE — Telephone Encounter (Signed)
Rx refill sent to pharmacy. 

## 2016-03-27 ENCOUNTER — Other Ambulatory Visit: Payer: Self-pay | Admitting: Neurology

## 2016-04-06 ENCOUNTER — Ambulatory Visit (INDEPENDENT_AMBULATORY_CARE_PROVIDER_SITE_OTHER): Payer: Medicare Other | Admitting: Internal Medicine

## 2016-04-06 ENCOUNTER — Encounter: Payer: Self-pay | Admitting: Internal Medicine

## 2016-04-06 VITALS — BP 122/76 | HR 75 | Ht 63.0 in | Wt 132.6 lb

## 2016-04-06 DIAGNOSIS — J189 Pneumonia, unspecified organism: Secondary | ICD-10-CM | POA: Diagnosis not present

## 2016-04-06 DIAGNOSIS — J9601 Acute respiratory failure with hypoxia: Secondary | ICD-10-CM

## 2016-04-06 NOTE — Assessment & Plan Note (Signed)
Resolved Acute Respiratory Failure with Hypoxia ? COPD ?ILD Plan: Continue wearing your oxygen at night. Continue your prednisone taper as instructed. Continue eating regular meals. Continue walking for re-conditioning. We will schedule a Pulmonary Function Test.( Prefers to have it done in the office) We will call you with  PFT results. We will schedule a High Resolution CT scan. Follow up with Dr. Chase Caller after PFT and and HRCT We will walk you in the office today to make sure you are not dropping your oxygen level. Please contact office for sooner follow up if symptoms do not improve or worsen or seek emergency care

## 2016-04-06 NOTE — Patient Instructions (Addendum)
It is nice to see you today. We are glad you are feeling better. Continue wearing your oxygen at night. Continue your prednisone taper as instructed. Continue eating regular meals. Continue walking for re-conditioning. We will schedule a Pulmonary Function Test.( Prefers to have it done in the office) We will call you with  PFT results. We will schedule a High Resolution CT scan. Follow up with Dr. Chase Caller after PFT and and HRCT We will walk you in the office today to make sure you are not dropping your oxygen level. Please contact office for sooner follow up if symptoms do not improve or worsen or seek emergency care

## 2016-04-06 NOTE — Progress Notes (Signed)
   Subjective:    Patient ID: [name], female    DOB: May 04, 1929, 82 y.o.   MRN: [id number]  HPI    Review of Systems  Constitutional: Negative for fever and unexpected weight change.  HENT: Negative for congestion, dental problem, ear pain, nosebleeds, postnasal drip, rhinorrhea, sinus pressure, sneezing, sore throat and trouble swallowing.   Eyes: Negative for redness and itching.  Respiratory: Negative for cough, chest tightness, shortness of breath and wheezing.   Cardiovascular: Negative for palpitations and leg swelling.  Gastrointestinal: Negative for nausea and vomiting.  Genitourinary: Negative for dysuria.  Musculoskeletal: Negative for joint swelling.  Skin: Negative for rash.  Neurological: Negative for headaches.  Hematological: Does not bruise/bleed easily.  Psychiatric/Behavioral: Negative for dysphoric mood. The patient is not nervous/anxious.        Objective:   Physical Exam  See np note      Assessment & Plan:

## 2016-04-06 NOTE — Progress Notes (Signed)
Subjective:    Patient ID: Angela Franco, female    DOB: 1929-01-30, 82 y.o.   MRN: [id number]  HPI  82 year never smoker  old female with recent hospital admission for Acute respiratory failure with hypoxia after both influenza A, Influenza B and RSV. She is here for  eval of possible  COPD and ILD as her CXR's are hyperinflated and her pulmonary issues resolved with steroids.She is on chronic anti-coagulation for a fib.  04/06/2016  Pt. Presents to the office for hospital follow up. She is doing well. She has completed her Doxycycline and Tamiflu. She is currently on a prednisone taper which will be complete on 04/22/2016.Denies fever, chest pain,orthopnea,hemoptysis, fatigue leg or calf pain.She has regained her appetite, and is exercising daily.She is using her Nocturnal oxygen at 2 L.She is requesting PFT's for formal diagnosis of COPD.  02/28/2016 EXAM: CHEST 2 VIEW  COMPARISON: 01/20/2016  FINDINGS: There is chronic cardiomegaly. Pulmonary vascularity is normal. No infiltrates or effusions. Lungs are hyperinflated consistent with emphysema. No acute osseous abnormality.  IMPRESSION: Chronic cardiomegaly. Hyperinflated lungs consistent with emphysema.    Significant Events/Studies:  Admit date: 02/28/2016 Discharge date: 03/02/2016  Recommendations for Outpatient Follow-up:  1. Complete doxycycline and tamiflu 2. Prednisone taper as below 3. OP follow up to be scheduled to with Dr. Aaron Edelman [facility] or IPF work-up  Discharge Diagnoses:  Principal Problem:  Acute respiratory failure with hypoxia [facility]) Active Problems:  Normocytic anemia  Atrial fibrillation, chronic (HCC)  Moderate to severe pulmonary hypertension, PA pressure in 70s June 2013  Non-ischemic cardiomyopathy EF improved from 30-35% to 40-45% by Echo in 07/2012  HTN (hypertension)  COPD exacerbation (HCC)  Acute renal failure superimposed   Current outpatient prescriptions:  .   apixaban (ELIQUIS) 2.5 MG TABS tablet, Take 1 tablet (2.5 mg total) by mouth 2 (two) times daily., Disp: 60 tablet, Rfl: 11 .  calcium-vitamin D (OSCAL WITH D) 500-200 MG-UNIT tablet, Take 1 tablet by mouth daily with breakfast., Disp: , Rfl:  .  carvedilol (COREG) 6.25 MG tablet, Take 1 tablet (6.25 mg total) by mouth 2 (two) times daily with a meal., Disp: 60 tablet, Rfl: 4 .  docusate sodium 100 MG CAPS, Take 200 mg by mouth at bedtime as needed., Disp: 60 capsule, Rfl: 0 .  feeding supplement (BOOST / RESOURCE BREEZE) LIQD, Take 1 Container by mouth 3 (three) times daily between meals., Disp: 237 mL, Rfl: 0 .  ferrous sulfate 325 (65 FE) MG tablet, Take 325 mg by mouth daily with breakfast., Disp: , Rfl:  .  furosemide (LASIX) 80 MG tablet, Take 1 tablet (80 mg total) by mouth daily., Disp: 30 tablet, Rfl: 11 .  gabapentin (NEURONTIN) 600 MG tablet, Take 1 tablet (600 mg total) by mouth 3 (three) times daily. Please dose at 8am, 2pm and 8pm, Disp: 270 tablet, Rfl: 3 .  ipratropium-albuterol (DUONEB) 0.5-2.5 (3) MG/3ML SOLN, Take 3 mLs by nebulization every 6 (six) hours as needed., Disp: 360 mL, Rfl: 12 .  lisinopril (PRINIVIL,ZESTRIL) 10 MG tablet, Take 0.5 tablets (5 mg total) by mouth daily., Disp: 30 tablet, Rfl: 6 .  Melatonin 10 MG TABS, Take 10 mg by mouth daily., Disp: , Rfl:  .  potassium chloride SA (K-DUR,KLOR-CON) 20 MEQ tablet, Take 20 mEq by mouth daily., Disp: , Rfl:  .  predniSONE (DELTASONE) 5 MG tablet, Take 1 tablet (5 mg total) by mouth daily with breakfast., Disp: 30 tablet, Rfl: 0 .  senna-docusate (SENOKOT-S)  8.6-50 MG tablet, Take 1 tablet by mouth 2 (two) times daily., Disp: 60 tablet, Rfl: 0 .  simvastatin (ZOCOR) 10 MG tablet, Take 0.5 tablets (5 mg total) by mouth daily at 6 PM., Disp: 45 tablet, Rfl: 3   Past Medical History  Diagnosis Date  . COPD (chronic obstructive pulmonary disease) ([place])     Never smoked. Questionable diagnosis.  . Atrial fibrillation ([place])    . Osteopenia   . Insomnia   . Hyponatremia   . Hypertension   . UTI (lower urinary tract infection)   . Bursitis of hip   . Trigeminal neuralgia   . Altered mental status 08/24/2012  . Anemia, with significant drop in H/H 08/24/2012  . CHF (congestive heart failure) ([place])   . Heart disease   . Acute facial pain   . LBBB (left bundle branch block)   . Nonischemic cardiomyopathy ([place])   . Dyslipidemia   . Kidney failure     Stage 3  . Skin cancer of nose   . Complication of anesthesia     "I'm slow to recover when I'm put to sleep"  . On home oxygen therapy     "2L at night and prn during the day" (02/28/2016)  . Arthritis     "fingers" (02/28/2016)    Allergies  Allergen Reactions  . Penicillins Hives    Has patient had a PCN reaction causing immediate rash, facial/tongue/throat swelling, SOB or lightheadedness with hypotension: No Has patient had a PCN reaction causing severe rash involving mucus membranes or skin necrosis: No Has patient had a PCN reaction that required hospitalization No Has patient had a PCN reaction occurring within the last 10 years: No If all of the above answers are "NO", then may proceed with Cephalosporin use.    Review of Systems Constitutional:   No  weight loss, night sweats,  Fevers, chills, fatigue, or  lassitude.  HEENT:   No headaches,  Difficulty swallowing,  Tooth/dental problems, or  Sore throat,                No sneezing, itching, ear ache, nasal congestion, post nasal drip,   CV:  No chest pain,  Orthopnea, PND, swelling in lower extremities, anasarca, dizziness, palpitations, syncope.   GI  No heartburn, indigestion, abdominal pain, nausea, vomiting, diarrhea, change in bowel habits, loss of appetite, bloody stools.   Resp: No shortness of breath with exertion or at rest.  No excess mucus, no productive cough,  No non-productive cough,  No coughing up of blood.  No change in color of mucus.  No wheezing.  No chest wall  deformity  Skin: no rash or lesions.  GU: no dysuria, change in color of urine, no urgency or frequency.  No flank pain, no hematuria   MS:  No joint pain or swelling.  No decreased range of motion.  No back pain.  Psych:  No change in mood or affect. No depression or anxiety.  No memory loss.        Objective:   Physical Exam BP 122/76 mmHg  Pulse 75  Ht 5\' 3"  (1.6 m)  Wt 132 lb 9.6 oz (60.147 kg)  BMI 23.49 kg/m2  SpO2 96%   Physical Exam:  General- No distress,  A&Ox3, pleasant ENT: No sinus tenderness, TM clear, pale nasal mucosa, no oral exudate,no post nasal drip, no LAN Cardiac: S1, S2, regular rate and rhythm, no murmur Chest: No wheeze/ rales/ dullness; no accessory muscle use, no  nasal flaring, no sternal retractions Abd.: Soft Non-tender Ext: No clubbing cyanosis, edema Neuro:  normal strength Skin: No rashes, warm and dry Psych: normal mood and behavior   Magdalen Spatz, AGACNP-BC [facility] 04/06/2016     Assessment & Plan:    STAFF NOTE: I, Dr Ann Lions have personally reviewed patient's available data, including medical history, events of note, physical examination and test results as part of my evaluation. I have discussed with resident/NP and other care providers such as pharmacist, RN and RRT.  In addition,  I personally evaluated patient and elicited key findings of   S: 82 year old with NICM hx. Moved from [place] to [place] in 2013 to be near daughter.  3 resp viral illnesses in 2017 FLu A Jan 2017 > RSVA A FEb 2017 and then FLu B end march 2017. Then was physically deconditioned. Now back to normal ADL. Denies resp complaints. Due to recurrent different resp viruses (though al;l winter and within realm of ppossibility) given unusual nature areferred to pccm  O: Eldedrly Somewhat deconditioned looking but otherwise well Basal crack;es faint +  Walk test in office - no desats  csxr - visulaized - hyperfinaltin with ?  ILD changes  A: reccurrent resp viral infectio -likely unlucky phenomenon Rule out ILD    ICD-9-CM ICD-10-CM   1. Recurrent pneumonia 486 J18.9 CT Chest High Resolution     Pulmonary Function Test  2. Acute respiratory failure with hypoxia (HCC) 518.81 J96.01      P: HRCT and PFT and regroup   .  Rest per NP/medical resident whose note is outlined above and that I agree with  Dr. Brand Males, M.D., [facility].C.P Pulmonary and Critical Care Medicine Staff Physician [place] [facility] Pager: [phone], If no answer or between  15:00h - 7:00h: call [phone]  04/06/2016 4:09 PM

## 2016-04-10 ENCOUNTER — Telehealth: Payer: Self-pay | Admitting: Neurology

## 2016-04-10 MED ORDER — OXCARBAZEPINE 150 MG PO TABS: ORAL_TABLET | ORAL | Status: DC

## 2016-04-10 NOTE — Telephone Encounter (Signed)
Pt's daughter called said [facility]/[phone] said they faxed script for oxcarbazine 150mg  over twice and called also but has not gotten a response back from clinic. Pt's daughter said she was started back on this medication 03/21/16. She takes 1/2 in am and 1/2 in pm. Please check for refill.

## 2016-04-10 NOTE — Telephone Encounter (Signed)
Dr. Krista Blue has provided prescription for oxcarbazepine 150mg , 0.5mg  tablet BID.  She asked that patient's appt be moved to one month earlier than currently scheduled.  Spoke to [employer] on [employer]) - she is aware the prescription has been sent to the pharmacy and the patient's follow up has been rescheduled.  We had not received a call from [facility] prior to daughter calling today.

## 2016-04-14 ENCOUNTER — Ambulatory Visit (INDEPENDENT_AMBULATORY_CARE_PROVIDER_SITE_OTHER)
Admission: RE | Admit: 2016-04-14 | Discharge: 2016-04-14 | Disposition: A | Discharge status: Home / Self Care | Payer: Medicare Other | Source: Ambulatory Visit | Attending: Internal Medicine | Admitting: Internal Medicine

## 2016-04-14 DIAGNOSIS — J189 Pneumonia, unspecified organism: Secondary | ICD-10-CM | POA: Diagnosis not present

## 2016-04-21 ENCOUNTER — Other Ambulatory Visit: Payer: Self-pay | Admitting: Neurology

## 2016-04-27 ENCOUNTER — Telehealth: Payer: Self-pay | Admitting: Internal Medicine

## 2016-04-27 DIAGNOSIS — R591 Generalized enlarged lymph nodes: Secondary | ICD-10-CM

## 2016-04-27 NOTE — Telephone Encounter (Signed)
Let [name] know that CT shows   -> mild inflammation in lungs that likely is remnamnt from her 3 resp viral infections . We wills ee how PFT is prior to next OV in July (already on schedule ) before we decide on fu CT  -? There is a new lymph node enlargemetn in abdomen. New since 2013. Radiology recomending CT abd/pelvis with IV and po contrast. We can order it or she can talk to pcp ARONSON,RICHARD A, MD about it  Thanks  Dr. Brand Males, M.D., [facility].C.P Pulmonary and Critical Care Medicine Staff Physician [place] [facility] Pager: [phone], If no answer or between  15:00h - 7:00h: call [phone]  04/27/2016 6:39 AM        IMPRESSION: 1. Patchy coalescent ground-glass centrilobular nodularity and extensive air trapping throughout both lungs. This combination of findings is suggestive of subacute hypersensitivity pneumonitis. 2. No evidence of fibrotic interstitial lung disease. 3. Borderline tracheomalacia. 4. Partially visualized mild left para-aortic lymphadenopathy, new since 2013 CT abdomen study, nonspecific. Recommend a dedicated CT abdomen/pelvis with IV and oral contrast for further evaluation. 5. Stable mild-to-moderate cardiomegaly. Two-vessel coronary atherosclerosis.   Electronically Signed  By: Ilona Sorrel M.D.  On: 04/14/2016 11:26

## 2016-04-28 ENCOUNTER — Telehealth: Payer: Self-pay | Admitting: Internal Medicine

## 2016-04-28 NOTE — Telephone Encounter (Signed)
Spoke to [place] in [place]. Change to poral contrast study alone due to high creat gfr  Dr. Brand Males, M.D., [facility].C.P Pulmonary and Critical Care Medicine Staff Physician [place] [facility] Pager: [phone], If no answer or between  15:00h - 7:00h: call [phone]  04/28/2016 4:22 PM

## 2016-04-28 NOTE — Telephone Encounter (Signed)
[phone], pt daughter cb

## 2016-04-28 NOTE — Telephone Encounter (Signed)
Spoke with MR. He wanted me to call Marzetta Board and have call his cell phone. A message has been left with Stacy with MR's cell phone. Nothing further was needed.

## 2016-04-28 NOTE — Telephone Encounter (Signed)
thanks

## 2016-04-28 NOTE — Addendum Note (Signed)
Addended by: Parke Poisson E on: 04/28/2016 02:47 PM   Modules accepted: Orders

## 2016-04-28 NOTE — Telephone Encounter (Signed)
lmtcb x1 for pt. 

## 2016-04-28 NOTE — Telephone Encounter (Signed)
Called spoke with patient's daughter Hilda Blades and discussed CT results / recs as stated by MR below.  Hilda Blades voiced her understanding.  She would like MR to go ahead and order the CT abd/pelvis and they can follow up with PCP if needed afterward.  Since MR okayed ordering the CT below, order has been placed with Debra's name/number listed for scheduling.  She is aware we will contact her.  Will sign and forward to MR to make him aware of CT abd/pelvis order.

## 2016-05-05 ENCOUNTER — Ambulatory Visit (INDEPENDENT_AMBULATORY_CARE_PROVIDER_SITE_OTHER)
Admission: RE | Admit: 2016-05-05 | Discharge: 2016-05-05 | Disposition: A | Discharge status: Home / Self Care | Payer: Medicare Other | Source: Ambulatory Visit | Attending: Internal Medicine | Admitting: Internal Medicine

## 2016-05-05 ENCOUNTER — Inpatient Hospital Stay: Admission: RE | Admit: 2016-05-05 | Payer: Medicare Other | Source: Ambulatory Visit

## 2016-05-05 ENCOUNTER — Telehealth: Payer: Self-pay | Admitting: Internal Medicine

## 2016-05-05 DIAGNOSIS — R591 Generalized enlarged lymph nodes: Secondary | ICD-10-CM | POA: Diagnosis not present

## 2016-05-05 NOTE — Telephone Encounter (Signed)
  Let Angela Franco know that ct abdomnen shows that the lymph node enlargement in abdomen is new since 2013 and is not normal and suspicious of cancer. I am a lung doctor and is outside my field at this point. She needs biopsy. So a) plese tell her to conact her PCP ARONSON,RICHARD A, MD\ and also please send report and make a call to PCP office. They have to take it from her (I have copied Dr Reynaldo Minium though)  Thanks  Dr. Brand Males, M.D., [facility].C.P Pulmonary and Critical Care Medicine Staff Physician [place] [facility] Pager: [phone], If no answer or between  15:00h - 7:00h: call [phone]  05/05/2016 4:34 PM     Ct Abdomen Pelvis Wo Contrast  05/05/2016  CLINICAL DATA:  82 year old female with partially visualized periaortic abdominal lymphadenopathy on recent noncontrast chest CT. Subsequent encounter. EXAM: CT ABDOMEN AND PELVIS WITHOUT CONTRAST TECHNIQUE: Multidetector CT imaging of the abdomen and pelvis was performed following the standard protocol without IV contrast. COMPARISON:  Chest CT 04/14/2016.  CT Abdomen and Pelvis 07/06/2012. FINDINGS: Chronic moderate to severe cardiomegaly. No pericardial or pleural effusion. Stable lung bases to the recent chest CT. Stable visualized osseous structures. No acute or suspicious osseous lesion identified. No pelvic free fluid. Negative uterus and adnexa. Diminutive in negative urinary bladder. Negative rectum. Negative sigmoid colon. Negative left colon. Negative transverse and right colon aside from some retained stool. Normal terminal ileum. Oral contrast has reached the ileocecal valve. No dilated or abnormal small bowel loops. Negative stomach and duodenum. No abdominal free air or free fluid. Noncontrast appearance of the liver remarkable for mildly increased density, possibly related to amiodarone therapy in this setting. Negative noncontrast spleen, no splenomegaly. Negative noncontrast  pancreas and adrenal glands. Negative noncontrast kidneys. Aortoiliac calcified atherosclerosis noted. Pathologically enlarged retroperitoneal lymph nodes individually measuring 21 mm short axis along the anterior and left lateral aspect of the aorta. The adenopathy continues to the bilateral common iliac nodal stations worse on the left (series 2, image 41). No definite pelvic sidewall lymphadenopathy. No inguinal lymphadenopathy. Nonenlarged retrocrural nodes at the level of the diaphragm see mildly increased in conspicuity since 2013. There is also a more mildly abnormal 11 mm mesenteric lymph node at the level of the aortoiliac bifurcation on series 2, image 31 (and also on coronal image 25). No other associated mesenteric or other upper abdominal lymphadenopathy. IMPRESSION: 1. Pathologically enlarged retroperitoneal lymph nodes in the abdomen and upper pelvis new since 2013 and most compatible with Lymphoma/leukemia, or other Lymphoproliferative disorder. These might be amenable to posterior approach CT-guided needle biopsy. 2. Questionable early involvement of some mesenteric and retrocrural nodes. 3. No other acute or malignant process identified in the abdomen and pelvis in the absence of IV contrast. No splenomegaly. 4. Chronic moderate to severe cardiomegaly. Calcified aortic atherosclerosis. Electronically Signed   By: Genevie Ann M.D.   On: 05/05/2016 16:06

## 2016-05-09 NOTE — Telephone Encounter (Signed)
Spoke with pt's daughter, aware of results/recs.  Results forwarded to Dr. Jacquiline Doe office.   Will call Dr. Jacquiline Doe office at [facility] request, but it is after 5:00 and office is closed. Will hold message and call back. Dr. Jacquiline Doe office is [phone]

## 2016-05-11 ENCOUNTER — Ambulatory Visit: Payer: Medicare Other | Admitting: Internal Medicine

## 2016-05-13 ENCOUNTER — Emergency Department (HOSPITAL_BASED_OUTPATIENT_CLINIC_OR_DEPARTMENT_OTHER)
Admission: EM | Admit: 2016-05-13 | Discharge: 2016-05-13 | Disposition: A | Discharge status: Home / Self Care | Payer: Medicare Other | Attending: Emergency Medicine | Admitting: Emergency Medicine

## 2016-05-13 ENCOUNTER — Encounter (HOSPITAL_BASED_OUTPATIENT_CLINIC_OR_DEPARTMENT_OTHER): Payer: Self-pay | Admitting: Emergency Medicine

## 2016-05-13 ENCOUNTER — Emergency Department (HOSPITAL_BASED_OUTPATIENT_CLINIC_OR_DEPARTMENT_OTHER): Payer: Medicare Other

## 2016-05-13 DIAGNOSIS — J449 Chronic obstructive pulmonary disease, unspecified: Secondary | ICD-10-CM | POA: Diagnosis not present

## 2016-05-13 DIAGNOSIS — M1990 Unspecified osteoarthritis, unspecified site: Secondary | ICD-10-CM | POA: Insufficient documentation

## 2016-05-13 DIAGNOSIS — I509 Heart failure, unspecified: Secondary | ICD-10-CM | POA: Insufficient documentation

## 2016-05-13 DIAGNOSIS — E785 Hyperlipidemia, unspecified: Secondary | ICD-10-CM | POA: Diagnosis not present

## 2016-05-13 DIAGNOSIS — M79645 Pain in left finger(s): Secondary | ICD-10-CM | POA: Diagnosis present

## 2016-05-13 DIAGNOSIS — L03012 Cellulitis of left finger: Secondary | ICD-10-CM | POA: Diagnosis not present

## 2016-05-13 DIAGNOSIS — I110 Hypertensive heart disease with heart failure: Secondary | ICD-10-CM | POA: Insufficient documentation

## 2016-05-13 DIAGNOSIS — N19 Unspecified kidney failure: Secondary | ICD-10-CM | POA: Diagnosis not present

## 2016-05-13 DIAGNOSIS — B999 Unspecified infectious disease: Secondary | ICD-10-CM

## 2016-05-13 MED ORDER — ACETAMINOPHEN-CODEINE #3 300-30 MG PO TABS: 1.0000 | ORAL_TABLET | Freq: Four times a day (QID) | ORAL | Status: DC | PRN

## 2016-05-13 MED ORDER — DOXYCYCLINE HYCLATE 100 MG PO CAPS: 100.0000 mg | ORAL_CAPSULE | Freq: Two times a day (BID) | ORAL | Status: DC

## 2016-05-13 MED ORDER — TETANUS-DIPHTH-ACELL PERTUSSIS 5-2.5-18.5 LF-MCG/0.5 IM SUSP
0.5000 mL | Freq: Once | INTRAMUSCULAR | Status: AC
  Administered 2016-05-13: 0.5 mL via INTRAMUSCULAR
  Filled 2016-05-13: qty 0.5

## 2016-05-13 MED ORDER — DOXYCYCLINE HYCLATE 100 MG PO TABS
100.0000 mg | ORAL_TABLET | Freq: Once | ORAL | Status: AC
  Administered 2016-05-13: 100 mg via ORAL
  Filled 2016-05-13: qty 1

## 2016-05-13 MED ORDER — LIDOCAINE HCL 2 % IJ SOLN
10.0000 mL | Freq: Once | INTRAMUSCULAR | Status: AC
  Administered 2016-05-13: 200 mg via INTRADERMAL
  Filled 2016-05-13: qty 20

## 2016-05-13 NOTE — Discharge Instructions (Signed)
Please follow up with your doctor in 2 days for a wound recheck.  Follow up with hand specialist if your condition worsen    Cellulitis Cellulitis is an infection of the skin and the tissue beneath it. The infected area is usually red and tender. Cellulitis occurs most often in the arms and lower legs.  CAUSES  Cellulitis is caused by bacteria that enter the skin through cracks or cuts in the skin. The most common types of bacteria that cause cellulitis are staphylococci and streptococci. SIGNS AND SYMPTOMS   Redness and warmth.  Swelling.  Tenderness or pain.  Fever. DIAGNOSIS  Your health care provider can usually determine what is wrong based on a physical exam. Blood tests may also be done. TREATMENT  Treatment usually involves taking an antibiotic medicine. HOME CARE INSTRUCTIONS   Take your antibiotic medicine as directed by your health care provider. Finish the antibiotic even if you start to feel better.  Keep the infected arm or leg elevated to reduce swelling.  Apply a warm cloth to the affected area up to 4 times per day to relieve pain.  Take medicines only as directed by your health care provider.  Keep all follow-up visits as directed by your health care provider. SEEK MEDICAL CARE IF:   You notice red streaks coming from the infected area.  Your red area gets larger or turns dark in color.  Your bone or joint underneath the infected area becomes painful after the skin has healed.  Your infection returns in the same area or another area.  You notice a swollen bump in the infected area.  You develop new symptoms.  You have a fever. SEEK IMMEDIATE MEDICAL CARE IF:   You feel very sleepy.  You develop vomiting or diarrhea.  You have a general ill feeling (malaise) with muscle aches and pains.   This information is not intended to replace advice given to you by your health care provider. Make sure you discuss any questions you have with your health care  provider.   Document Released: 08/30/2005 Document Revised: 08/11/2015 Document Reviewed: 02/05/2012 Elsevier Interactive Patient Education [employer].

## 2016-05-13 NOTE — ED Provider Notes (Signed)
CSN: [id number]     Arrival date & time 05/13/16  1015 History   First MD Initiated Contact with Patient 05/13/16 1027     Chief Complaint  Patient presents with  . finger swelling      (Consider location/radiation/quality/duration/timing/severity/associated sxs/prior Treatment) HPI   82 year old female with history of COPD on home O2, atrial fibrillation currently on pelvic was, osteopenia, CHF, cardiomyopathy presenting today with complaints of finger swelling.Patient report pain and swelling to her left ring finger since exudate. Her pain is described as a throbbing achy sensation, moderate in severity, worsening with palpation. She denies any specific injury. She endorses subjective fever. She has been taking extra strength Tylenol with some improvement. She has also soaked her finger in warm water with epsom salt.  She has previous history of cellulitis/abscess to the same finger requiring I&D in the past. She cannot recall her last tetanus status. She denies any numbness. She is right-hand dominant.  Past Medical History  Diagnosis Date  . COPD (chronic obstructive pulmonary disease) ([place])     Never smoked. Questionable diagnosis.  . Atrial fibrillation ([place])   . Osteopenia   . Insomnia   . Hyponatremia   . Hypertension   . UTI (lower urinary tract infection)   . Bursitis of hip   . Trigeminal neuralgia   . Altered mental status 08/24/2012  . Anemia, with significant drop in H/H 08/24/2012  . CHF (congestive heart failure) ([place])   . Heart disease   . Acute facial pain   . LBBB (left bundle branch block)   . Nonischemic cardiomyopathy ([place])   . Dyslipidemia   . Kidney failure     Stage 3  . Skin cancer of nose   . Complication of anesthesia     "I'm slow to recover when I'm put to sleep"  . On home oxygen therapy     "2L at night and prn during the day" (02/28/2016)  . Arthritis     "fingers" (02/28/2016)   Past Surgical History  Procedure Laterality Date  .  Appendectomy    . Cardiac catheterization  07/04/2012    normal coronaries, EF 30-35%, elevated R & L heart pressures, reduced CO, mild pulm HTN (Dr. K. Mali Hilty)  . Transthoracic echocardiogram  2013    mild conc LVH, RV mildly dilated; LA severely dilated; RA mod dilated; mild-mod mitral annular calcif, calcif of anterior & posterior MV leaflet, mod MR; mod-severe TR with RSVP 30-19mmHg; mild calcif of AV leaflets and mod aortic regurg; mild pulm valve regurg; pericardial effusion (right)  . Nm myocar perf wall motion  2011    lexiscan myoview - normal LV systolic function w/normal wall motion, no evidence of scar/ischemia  . Left and right heart catheterization with coronary angiogram N/A 07/04/2012    Procedure: LEFT AND RIGHT HEART CATHETERIZATION WITH CORONARY ANGIOGRAM;  Surgeon: Pixie Casino, MD;  Location: [facility] CATH LAB;  Service: Cardiovascular;  Laterality: N/A;  . Tonsillectomy and adenoidectomy    . Breast surgery Right     "took out some nodes"  . Skin cancer excision      "nose"  . Dilation and curettage of uterus    . Cataract extraction w/ intraocular lens  implant, bilateral Bilateral    Family History  Problem Relation Age of Onset  . Stroke Father   . Heart attack Father   . Heart attack Brother   . Heart attack Brother   . Alzheimer's disease Mother  Social History  Substance Use Topics  . Smoking status: Never Smoker   . Smokeless tobacco: Never Used  . Alcohol Use: No   OB History    No data available     Review of Systems  Constitutional: Positive for fever.  Musculoskeletal: Positive for arthralgias.  Skin: Positive for rash.      Allergies  Penicillins  Home Medications   Prior to Admission medications   Medication Sig Start Date End Date Taking? Authorizing Provider  apixaban (ELIQUIS) 2.5 MG TABS tablet Take 1 tablet (2.5 mg total) by mouth 2 (two) times daily. 11/16/15   Pixie Casino, MD  calcium-vitamin D (OSCAL WITH D) 500-200  MG-UNIT tablet Take 1 tablet by mouth daily with breakfast.    Historical Provider, MD  carvedilol (COREG) 6.25 MG tablet Take 1 tablet (6.25 mg total) by mouth 2 (two) times daily with a meal. 03/23/16   Pixie Casino, MD  docusate sodium 100 MG CAPS Take 200 mg by mouth at bedtime as needed. 08/26/12   Orson Eva, MD  feeding supplement (BOOST / RESOURCE BREEZE) LIQD Take 1 Container by mouth 3 (three) times daily between meals. 01/26/16   Iline Oven, MD  ferrous sulfate 325 (65 FE) MG tablet Take 325 mg by mouth daily with breakfast.    Historical Provider, MD  furosemide (LASIX) 80 MG tablet Take 1 tablet (80 mg total) by mouth daily. 11/10/15 09/21/18  Erlene Quan, PA-C  gabapentin (NEURONTIN) 600 MG tablet Take 1 tablet (600 mg total) by mouth 3 (three) times daily. Please dose at 8am, 2pm and 8pm 01/19/16   Marcial Pacas, MD  gabapentin (NEURONTIN) 600 MG tablet TAKE 1 TABLET (600 MG TOTAL) BY MOUTH THREE   TIMES DAILY. PLEASE DOSE AT [place] AND 8PM 04/24/16   Marcial Pacas, MD  ipratropium-albuterol (DUONEB) 0.5-2.5 (3) MG/3ML SOLN Take 3 mLs by nebulization every 6 (six) hours as needed. 01/04/16   Liberty Handy, MD  lisinopril (PRINIVIL,ZESTRIL) 10 MG tablet Take 0.5 tablets (5 mg total) by mouth daily. 03/02/16   Nita Sells, MD  Melatonin 10 MG TABS Take 10 mg by mouth daily.    Historical Provider, MD  OXcarbazepine (TRILEPTAL) 150 MG tablet Take 0.5 tablet twice daily. 04/10/16   Marcial Pacas, MD  potassium chloride SA (K-DUR,KLOR-CON) 20 MEQ tablet Take 20 mEq by mouth daily.    Historical Provider, MD  predniSONE (DELTASONE) 5 MG tablet Take 1 tablet (5 mg total) by mouth daily with breakfast. 03/02/16   Nita Sells, MD  senna-docusate (SENOKOT-S) 8.6-50 MG tablet Take 1 tablet by mouth 2 (two) times daily. 01/26/16   Iline Oven, MD  simvastatin (ZOCOR) 10 MG tablet Take 0.5 tablets (5 mg total) by mouth daily at 6 PM. 11/24/15   Pixie Casino, MD   BP 113/55 mmHg  Pulse  67  Temp(Src) 99.2 F (37.3 C) (Oral)  Resp 22  Wt 60.782 kg  SpO2 89% Physical Exam  Constitutional: She appears well-developed and well-nourished. No distress.  HENT:  Head: Atraumatic.  Eyes: Conjunctivae are normal.  Neck: Neck supple.  Musculoskeletal: She exhibits tenderness (Left hand: Edema, warmth, and tenderness noted throughout left ring finger most significant at the distal phalanx without any nail involvement. Exquisite tenderness to palpation. Able to flex and extend her PIP and DIP. Lymphangitis to dorsum of hand).  Neurological: She is alert.  Skin: Rash noted.  Psychiatric: She has a normal mood and affect.  Nursing  note and vitals reviewed.   ED Course  Procedures (including critical care time)  INCISION AND DRAINAGE Performed by: Domenic Moras Consent: Verbal consent obtained. Risks and benefits: risks, benefits and alternatives were discussed Type: abscess  Body area: L ring finger  Anesthesia: digital nerve block  Incision was made with a scalpel.  Local anesthetic: lidocaine 2% w/o epinephrine  Anesthetic total: 5 ml  Complexity: complex Blunt dissection to break up loculations  Drainage: purulent  Drainage amount: minimal  Packing material: pressure dressing  Patient tolerance: Patient tolerated the procedure well with no immediate complications.     MDM   Final diagnoses:  Infection  Cellulitis of ring finger of left hand    BP 113/55 mmHg  Pulse 67  Temp(Src) 99.2 F (37.3 C) (Oral)  Resp 22  Wt 60.782 kg  SpO2 93%   10:46 AM Patient presents with left ring finger pain. I suspect cellulitis with possible early forming abscess to the dorsum of the distal aspect without any nail involvement. Although there's evidence of lymphangitis, pt able to flex and extend finger, doubt tenosynovitis at the current time.  Care discussed with Dr. Oleta Mouse.  Will perform I&D    Domenic Moras, PA-C 05/13/16 [place], MD 05/13/16  1537

## 2016-05-13 NOTE — ED Notes (Signed)
Onset yesterday with left 4th finger swelling, redness and warmth. Today with fever 100.3 at home. Pt took tylenol extra strength prior to arrival. Previous hx of cellulitis to same finger a year ago.   On [place].

## 2016-05-15 LAB — AEROBIC CULTURE W GRAM STAIN (SUPERFICIAL SPECIMEN): Culture: NO GROWTH

## 2016-05-15 LAB — AEROBIC CULTURE  (SUPERFICIAL SPECIMEN): SPECIAL REQUESTS: NORMAL

## 2016-05-29 ENCOUNTER — Ambulatory Visit (INDEPENDENT_AMBULATORY_CARE_PROVIDER_SITE_OTHER): Payer: Medicare Other | Admitting: Internal Medicine

## 2016-05-29 DIAGNOSIS — J189 Pneumonia, unspecified organism: Secondary | ICD-10-CM | POA: Diagnosis not present

## 2016-05-29 LAB — PULMONARY FUNCTION TEST
DL/VA % pred: 109 %
DL/VA: 5.12 ml/min/mmHg/L
DLCO COR % PRED: 49 %
DLCO UNC % PRED: 49 %
DLCO UNC: 11.44 ml/min/mmHg
DLCO cor: 11.27 ml/min/mmHg
FEF 25-75 POST: 0.77 L/s
FEF 25-75 PRE: 0.55 L/s
FEF2575-%Change-Post: 41 %
FEF2575-%PRED-POST: 68 %
FEF2575-%Pred-Pre: 48 %
FEV1-%Change-Post: 8 %
FEV1-%PRED-POST: 52 %
FEV1-%Pred-Pre: 48 %
FEV1-PRE: 0.82 L
FEV1-Post: 0.89 L
FEV1FVC-%Change-Post: -2 %
FEV1FVC-%PRED-PRE: 100 %
FEV6-%Change-Post: 12 %
FEV6-%PRED-POST: 58 %
FEV6-%PRED-PRE: 51 %
FEV6-POST: 1.25 L
FEV6-Pre: 1.12 L
FEV6FVC-%PRED-POST: 106 %
FEV6FVC-%Pred-Pre: 106 %
FVC-%CHANGE-POST: 11 %
FVC-%PRED-PRE: 48 %
FVC-%Pred-Post: 54 %
FVC-POST: 1.25 L
FVC-Pre: 1.12 L
POST FEV6/FVC RATIO: 100 %
PRE FEV1/FVC RATIO: 73 %
Post FEV1/FVC ratio: 71 %
Pre FEV6/FVC Ratio: 100 %
RV % PRED: 126 %
RV: 3.07 L
TLC % PRED: 84 %
TLC: 4.14 L

## 2016-05-29 NOTE — Progress Notes (Signed)
PFT done today. 

## 2016-06-09 ENCOUNTER — Other Ambulatory Visit: Payer: Medicare Other

## 2016-06-09 ENCOUNTER — Telehealth: Payer: Self-pay

## 2016-06-09 ENCOUNTER — Ambulatory Visit (INDEPENDENT_AMBULATORY_CARE_PROVIDER_SITE_OTHER): Payer: Medicare Other | Admitting: Internal Medicine

## 2016-06-09 ENCOUNTER — Encounter: Payer: Self-pay | Admitting: Internal Medicine

## 2016-06-09 VITALS — BP 122/64 | HR 85 | Ht 63.0 in | Wt 135.0 lb

## 2016-06-09 DIAGNOSIS — R591 Generalized enlarged lymph nodes: Secondary | ICD-10-CM | POA: Diagnosis not present

## 2016-06-09 DIAGNOSIS — R918 Other nonspecific abnormal finding of lung field: Secondary | ICD-10-CM | POA: Diagnosis not present

## 2016-06-09 DIAGNOSIS — R599 Enlarged lymph nodes, unspecified: Secondary | ICD-10-CM

## 2016-06-09 DIAGNOSIS — R942 Abnormal results of pulmonary function studies: Secondary | ICD-10-CM

## 2016-06-09 DIAGNOSIS — IMO0002 Reserved for concepts with insufficient information to code with codable children: Secondary | ICD-10-CM

## 2016-06-09 NOTE — Patient Instructions (Addendum)
ICD-9-CM ICD-10-CM   1. Abnormal PFT 794.2 R94.2   2. Abnormal CT scan of lung 793.19 R91.8   3. Lymphadenopathy 785.6 R59.1    Glad you are well  - do alpha 1 AT blood test for genetic cause of emphysema - repeat HRCT chest in 3 months  - use your duo-neb nebulizer atleast daily and see if you feel better - continue night time o2 use but if you feel is not making a diference we can in future look at removing it  Please follow with PCP ARONSON,RICHARD A, MD ASAP about abdomonal lymph-node  followup HRCT 3 months ROV with Dr Chase Caller in 3 months

## 2016-06-09 NOTE — Telephone Encounter (Signed)
Left VM at Dr. Jacquiline Doe office for a earlier appointment for a lymph node per MR Will await call back

## 2016-06-09 NOTE — Progress Notes (Signed)
Subjective:    Patient ID: Angela Franco, female    DOB: 05/10/1929, 82 y.o.   MRN: [id number]  HPI   29 year never smoker  old female with recent hospital admission for Acute respiratory failure with hypoxia after both influenza A, Influenza B and RSV. She is here for  eval of possible  COPD and ILD as her CXR's are hyperinflated and her pulmonary issues resolved with steroids.She is on chronic anti-coagulation for a fib.  04/06/2016  Pt. Presents to the office for hospital follow up. She is doing well. She has completed her Doxycycline and Tamiflu. She is currently on a prednisone taper which will be complete on 04/22/2016.Denies fever, chest pain,orthopnea,hemoptysis, fatigue leg or calf pain.She has regained her appetite, and is exercising daily.She is using her Nocturnal oxygen at 2 L.She is requesting PFT's for formal diagnosis of COPD.   OV 06/09/2016  Chief Complaint  Patient presents with  . Follow-up    CT & PFT results. breathing is doing well. no new concerns today.       Follow-up for acute respiratory failure in early 2017 due to multiple respiratory infections. This included influenza A in January 2017, RSV A in February 2017 influenza B in March 2017. At this point in time she is fully recovered. She is feeling independent. She uses a walker and uses nocturnal oxygen. However she feels nocturnal oxygen does not necessarily help her make her feel better. For activities of daily living with a walker she does not feel any dyspnea. This no cough. There is no wheeze. This no edema or orthopnea. There is no weight loss. She feels good. She has a nebulizer DuoNeb with her but she does not use it and she feels she does not needed. She did have a follow-up high-resolution CT chest in May 2017 that shows some ground glass opacities. This is probably residual infiltrates from her viral infection but radiologist was concerned about hypersensitive pneumonitis. She had full pulmonary function  test in 05/29/2016 that shows mixed obstructive restrictive pattern spirometry with normal total lung capacity and reduced DLCO to 49%. Her daughter is not with her but HER-2 sons are with her and they have been told by their sister the daughter to get an alpha 1 genetic test for emphysema.  Of note during the CT chest evaluation she did have a retroperitoneal lymph node that is new since 2013. We referred her to the primary care physician and left message with him ARONSON,RICHARD A, MD with the patient and son's attest that the next follow-up in October 2017.     has a past medical history of COPD (chronic obstructive pulmonary disease) ([place]); Atrial fibrillation ([place]); Osteopenia; Insomnia; Hyponatremia; Hypertension; UTI (lower urinary tract infection); Bursitis of hip; Trigeminal neuralgia; Altered mental status (08/24/2012); Anemia, with significant drop in H/H (08/24/2012); CHF (congestive heart failure) ([place]); Heart disease; Acute facial pain; LBBB (left bundle branch block); Nonischemic cardiomyopathy ([place]); Dyslipidemia; Kidney failure; Skin cancer of nose; Complication of anesthesia; On home oxygen therapy; and Arthritis.   reports that she has never smoked. She has never used smokeless tobacco.  Past Surgical History  Procedure Laterality Date  . Appendectomy    . Cardiac catheterization  07/04/2012    normal coronaries, EF 30-35%, elevated R & L heart pressures, reduced CO, mild pulm HTN (Dr. K. Mali Hilty)  . Transthoracic echocardiogram  2013    mild conc LVH, RV mildly dilated; LA severely dilated; RA mod dilated; mild-mod mitral annular calcif,  calcif of anterior & posterior MV leaflet, mod MR; mod-severe TR with RSVP 30-44mHg; mild calcif of AV leaflets and mod aortic regurg; mild pulm valve regurg; pericardial effusion (right)  . Nm myocar perf wall motion  2011    lexiscan myoview - normal LV systolic function w/normal wall motion, no evidence of scar/ischemia  . Left and right  heart catheterization with coronary angiogram N/A 07/04/2012    Procedure: LEFT AND RIGHT HEART CATHETERIZATION WITH CORONARY ANGIOGRAM;  Surgeon: KPixie Casino MD;  [facility];  Service: Cardiovascular;  Laterality: N/A;  . Tonsillectomy and adenoidectomy    . Breast surgery Right     "took out some nodes"  . Skin cancer excision      "nose"  . Dilation and curettage of uterus    . Cataract extraction w/ intraocular lens  implant, bilateral Bilateral     Allergies  Allergen Reactions  . Penicillins Hives    Has patient had a PCN reaction causing immediate rash, facial/tongue/throat swelling, SOB or lightheadedness with hypotension: No Has patient had a PCN reaction causing severe rash involving mucus membranes or skin necrosis: No Has patient had a PCN reaction that required hospitalization No Has patient had a PCN reaction occurring within the last 10 years: No If all of the above answers are "NO", then may proceed with Cephalosporin use.    Immunization History  Administered Date(s) Administered  . Influenza Split 08/26/2012  . Influenza-Unspecified 09/04/2015  . PPD Test 07/05/2012  . Tdap 05/13/2016    Family History  Problem Relation Age of Onset  . Stroke Father   . Heart attack Father   . Heart attack Brother   . Heart attack Brother   . Alzheimer's disease Mother      Current outpatient prescriptions:  .  acetaminophen-codeine (TYLENOL #3) 300-30 MG tablet, Take 1-2 tablets by mouth every 6 (six) hours as needed for moderate pain., Disp: 15 tablet, Rfl: 0 .  allopurinol (ZYLOPRIM) 100 MG tablet, Take 200 mg by mouth daily., Disp: , Rfl: 5 .  apixaban (ELIQUIS) 2.5 MG TABS tablet, Take 1 tablet (2.5 mg total) by mouth 2 (two) times daily., Disp: 60 tablet, Rfl: 11 .  calcium-vitamin D (OSCAL WITH D) 500-200 MG-UNIT tablet, Take 1 tablet by mouth daily with breakfast., Disp: , Rfl:  .  carvedilol (COREG) 6.25 MG tablet, Take 1 tablet (6.25 mg total) by  mouth 2 (two) times daily with a meal., Disp: 60 tablet, Rfl: 4 .  feeding supplement (BOOST / RESOURCE BREEZE) LIQD, Take 1 Container by mouth 3 (three) times daily between meals., Disp: 237 mL, Rfl: 0 .  ferrous sulfate 325 (65 FE) MG tablet, Take 325 mg by mouth daily with breakfast., Disp: , Rfl:  .  furosemide (LASIX) 80 MG tablet, Take 1 tablet (80 mg total) by mouth daily., Disp: 30 tablet, Rfl: 11 .  gabapentin (NEURONTIN) 600 MG tablet, Take 1 tablet (600 mg total) by mouth 3 (three) times daily. Please dose at 8am, 2pm and 8pm, Disp: 270 tablet, Rfl: 3 .  gabapentin (NEURONTIN) 600 MG tablet, TAKE 1 TABLET (600 MG TOTAL) BY MOUTH THREE   TIMES DAILY. PLEASE DOSE AT 8AM, 2PM AND 8PM, Disp: 270 tablet, Rfl: 3 .  ipratropium-albuterol (DUONEB) 0.5-2.5 (3) MG/3ML SOLN, Take 3 mLs by nebulization every 6 (six) hours as needed., Disp: 360 mL, Rfl: 12 .  Melatonin 10 MG TABS, Take 10 mg by mouth daily., Disp: , Rfl:  .  OXcarbazepine (TRILEPTAL) 150 MG tablet, Take 0.5 tablet twice daily., Disp: 30 tablet, Rfl: 2 .  potassium chloride SA (K-DUR,KLOR-CON) 20 MEQ tablet, Take 20 mEq by mouth daily., Disp: , Rfl:  .  simvastatin (ZOCOR) 10 MG tablet, Take 0.5 tablets (5 mg total) by mouth daily at 6 PM., Disp: 45 tablet, Rfl: 3     Review of Systems     Objective:   Physical Exam  Constitutional: She is oriented to person, place, and time. She appears well-developed and well-nourished. No distress.  HENT:  Head: Normocephalic and atraumatic.  Right Ear: External ear normal.  Left Ear: External ear normal.  Mouth/Throat: Oropharynx is clear and moist. No oropharyngeal exudate.  Eyes: Conjunctivae and EOM are normal. Pupils are equal, round, and reactive to light. Right eye exhibits no discharge. Left eye exhibits no discharge. No scleral icterus.  Neck: Normal range of motion. Neck supple. No JVD present. No tracheal deviation present. No thyromegaly present.  Cardiovascular: Normal rate,  regular rhythm, normal heart sounds and intact distal pulses.  Exam reveals no gallop and no friction rub.   No murmur heard. Pulmonary/Chest: Effort normal and breath sounds normal. No respiratory distress. She has no wheezes. She has no rales. She exhibits no tenderness.  Abdominal: Soft. Bowel sounds are normal. She exhibits no distension and no mass. There is no tenderness. There is no rebound and no guarding.  Musculoskeletal: Normal range of motion. She exhibits no edema or tenderness.  Kyphotic Uses walker  Lymphadenopathy:    She has no cervical adenopathy.  Neurological: She is alert and oriented to person, place, and time. She has normal reflexes. No cranial nerve deficit. She exhibits normal muscle tone. Coordination normal.  Skin: Skin is warm and dry. No rash noted. She is not diaphoretic. No erythema. No pallor.  Psychiatric: She has a normal mood and affect. Her behavior is normal. Judgment and thought content normal.  Vitals reviewed.   Filed Vitals:   06/09/16 1448  BP: 122/64  Pulse: 85  Height: '5\' 3"'  (1.6 m)  Weight: 135 lb (61.236 kg)  SpO2: 90%         Assessment & Plan:     ICD-9-CM ICD-10-CM   1. Abnormal PFT 794.2 R94.2 Alpha-1 antitrypsin phenotype  2. Abnormal CT scan of lung 793.19 R91.8 Alpha-1 antitrypsin phenotype     CT Chest High Resolution  3. Lymphadenopathy 785.6 R59.1 Alpha-1 antitrypsin phenotype    Very unusual situation of having 3 respiratory viruses within 3 seconds months in early 2017. But this is possible. The first time I'm seeing this personally. Her PFTs several months out in June 2017 is abnormal and could represent a hit and run phenomena because she never had any respiratory complaints before the viral infections. She is asymptomatic. I've asked her to try nebulizer or inhaler and she prefers to try nebulizer to see if taking them were making her feel even better. I doubt she'll comply. The family wants at alpha 1  genetic test so  we will get this. We will get a high-resolution CT chest in 3 months to ensure infiltrates are resolving.  She does have a new retroperitoneal lymphadenopathy. We will send this note to primary care physician again to ensure she is following up. I told the family that they need to follow-up with primary care physician. They understand and will do that   (> 50% of this 15 min visit spent in face to face counseling or/and coordination of care)  Dr. Brand Males, M.D., [facility]C.P Pulmonary and Critical Care Medicine Staff Physician [place] [facility] Pager: [phone], If no answer or between  15:00h - 7:00h: call [phone]  06/09/2016 4:01 PM

## 2016-06-13 LAB — ALPHA-1 ANTITRYPSIN PHENOTYPE: A-1 Antitrypsin: 188 mg/dL (ref 83–199)

## 2016-06-13 NOTE — Telephone Encounter (Signed)
Danae Chen with [employer] returning [place] call, CB is [phone].

## 2016-06-13 NOTE — Telephone Encounter (Signed)
[place] for Angela Franco with Dr Reynaldo Minium x 1

## 2016-06-13 NOTE — Telephone Encounter (Signed)
Spoke with Angela Franco at [employer], states that they have spoken with pt's daughter for an earlier appt with their office to be scheduled.  Nothing further needed.

## 2016-06-13 NOTE — Telephone Encounter (Signed)
[place] w/ Dr Jacquiline Doe office returned call Advised of MR's recs per his 7.7.17 ov note:  Of note during the CT chest evaluation she did have a retroperitoneal lymph node that is new since 2013. We referred her to the primary care physician and left message with him ARONSON,RICHARD A, MD with the patient and son's attest that the next follow-up in October 2017.  Per the 6.2.17 CT: 1. Pathologically enlarged retroperitoneal lymph nodes in the abdomen and upper pelvis new since 2013 and most compatible with Lymphoma/leukemia, or other Lymphoproliferative disorder. These might be amenable to posterior approach CT-guided needle biopsy. 2. Questionable early involvement of some mesenteric and retrocrural nodes. 3. No other acute or malignant process identified in the abdomen and pelvis in the absence of IV contrast. No splenomegaly. 4. Chronic moderate to severe cardiomegaly. Calcified aortic Atherosclerosis.  Judie Petit stated she will communicate with Dr Reynaldo Minium about appt for pt and call the pt with appt/date and time - she will communicate this with our office once complete.  Will route back triage for follow up.

## 2016-06-16 ENCOUNTER — Other Ambulatory Visit (HOSPITAL_COMMUNITY): Payer: Self-pay | Admitting: Internal Medicine

## 2016-06-16 DIAGNOSIS — R599 Enlarged lymph nodes, unspecified: Secondary | ICD-10-CM

## 2016-06-19 ENCOUNTER — Ambulatory Visit (INDEPENDENT_AMBULATORY_CARE_PROVIDER_SITE_OTHER): Payer: Medicare Other | Admitting: Nurse Practitioner

## 2016-06-19 ENCOUNTER — Encounter: Payer: Self-pay | Admitting: Nurse Practitioner

## 2016-06-19 VITALS — BP 125/68 | HR 95 | Ht 63.0 in | Wt 135.4 lb

## 2016-06-19 DIAGNOSIS — G500 Trigeminal neuralgia: Secondary | ICD-10-CM | POA: Diagnosis not present

## 2016-06-19 MED ORDER — OXCARBAZEPINE 150 MG PO TABS: ORAL_TABLET | ORAL | Status: DC

## 2016-06-19 NOTE — Patient Instructions (Signed)
Continue gabapentin at current dose Continue Trileptal at current dose will refill Follow-up in 6-8 months

## 2016-06-19 NOTE — Progress Notes (Signed)
[facility]  PATIENT: Angela Franco DOB: 24-Oct-1929   REASON FOR VISIT: Follow-up for trigeminal neuralgia HISTORY FROM: Patient and daughter    HISTORY OF PRESENT ILLNESS:Angela Franco, 82 year old female returns for followup. She has a history of facial pain and mild memory loss. Her memory score was 30/30 at last visit. She had previously been on carbamazepine but due to sodium levels in the low 120s her carbamazepine has been discontinued and she is on gabapentin for her facial pain with good control. She returns today with her daughter. She is currently in an assisted living. She exercises on a regular basis. She has no new complaints. She returns for reevaluation. She has attempted to taper the gabapentin with return of the facial pain. She was treated for pneumonia 2 months ago  HISTORY: She has history of facial pain and history of memory loss. She is doing well with her facial pain and her memory loss is stable. Her primary care physician Angela Franco called to the office 08/01/2013 and spoke to Angela Franco about decreasing carbamazepine due to sodium levels in the low 120s. She continues to take one carbamazepine daily She is also on gabapentin. She is accompanied by her son today. She is currently at an assisted living. She has no new complaints    UPDATE June 14 2015:YY He is with her daughter at today's clinical visit, she is seeing nephrologist for worsening chronic renal insufficiency, she is taking Gabapentin 600mg  tid, there was no recurrent right facial pain,  She lives alone at independent living, still active, exercise regularly, no significant memory trouble,  She does not want changes of her gabapentin, despite worsening kidney function, because recurrent right facial pain the past with lower dose of gabapentin,   UPDATE Oct 13 2015:YY She is with her daughter, she now complains of worsening right facial/cheek pain since end of Oct 2016, she is taking  gabapentin 600mg  tid, oxycodone as needed, which does help her pain, the benefit of oxycodone lasted about 6 hours. It does make her dizziness, confusion, previously carbamazepine was helpful, but caused hyponatremia,    UPDATE Jan 19 2016:YY Her right facial pain last till Jan 2017, She was seen by Angela Franco, she wants to hold off any intervention at this point At her worst, she was taking gabapentin 600 mg 3 times a day, Trileptal 150 mg half tablet 3 times a day, oxycodone 5 mg half to 1 tablet daily as needed, usually less than 2 tablets a day, she was giving fentanyl patch 25 g, but never has to use it.   UPDATE 07/17/2017CM Angela Franco, 82 year old female returns for follow-up. She has a history of trigeminal neuralgia which is currently well-controlled on gabapentin 600 mg 3 times a day and Trileptal 150 mg half tablet twice daily. She is pleased with her response to therapy she returns for reevaluation. Recent sodium level in primary care office was normal. She has had 3 hospitalizations this year for pneumonia and the flu   REVIEW OF SYSTEMS: Full 14 system review of systems performed and notable only for those listed, all others are neg:  Constitutional: neg  Cardiovascular: Leg swelling Ear/Nose/Throat: neg  Skin: neg Eyes: neg Respiratory: neg Gastroitestinal: neg  Hematology/Lymphatic: Swollen lymph nodes Endocrine: neg Musculoskeletal:neg Allergy/Immunology: neg Neurological: neg Psychiatric: neg Sleep : neg   ALLERGIES: Allergies  Allergen Reactions  . Penicillins Hives    Has patient had a PCN reaction causing immediate rash, facial/tongue/throat swelling, SOB or lightheadedness with  hypotension: No Has patient had a PCN reaction causing severe rash involving mucus membranes or skin necrosis: No Has patient had a PCN reaction that required hospitalization No Has patient had a PCN reaction occurring within the last 10 years: No If all of the above answers are "NO",  then may proceed with Cephalosporin use.    HOME MEDICATIONS: Outpatient Prescriptions Prior to Visit  Medication Sig Dispense Refill  . acetaminophen-codeine (TYLENOL #3) 300-30 MG tablet Take 1-2 tablets by mouth every 6 (six) hours as needed for moderate pain. 15 tablet 0  . allopurinol (ZYLOPRIM) 100 MG tablet Take 200 mg by mouth daily.  5  . apixaban (ELIQUIS) 2.5 MG TABS tablet Take 1 tablet (2.5 mg total) by mouth 2 (two) times daily. 60 tablet 11  . calcium-vitamin D (OSCAL WITH D) 500-200 MG-UNIT tablet Take 1 tablet by mouth daily with breakfast.    . carvedilol (COREG) 6.25 MG tablet Take 1 tablet (6.25 mg total) by mouth 2 (two) times daily with a meal. 60 tablet 4  . feeding supplement (BOOST / RESOURCE BREEZE) LIQD Take 1 Container by mouth 3 (three) times daily between meals. (Patient taking differently: Take 1 Container by mouth 3 (three) times daily between meals as needed. ) 237 mL 0  . ferrous sulfate 325 (65 FE) MG tablet Take 325 mg by mouth daily with breakfast.    . furosemide (LASIX) 80 MG tablet Take 1 tablet (80 mg total) by mouth daily. 30 tablet 11  . gabapentin (NEURONTIN) 600 MG tablet Take 1 tablet (600 mg total) by mouth 3 (three) times daily. Please dose at 8am, 2pm and 8pm 270 tablet 3  . ipratropium-albuterol (DUONEB) 0.5-2.5 (3) MG/3ML SOLN Take 3 mLs by nebulization every 6 (six) hours as needed. 360 mL 12  . Melatonin 10 MG TABS Take 10 mg by mouth daily.    . OXcarbazepine (TRILEPTAL) 150 MG tablet Take 0.5 tablet twice daily. 30 tablet 2  . potassium chloride SA (K-DUR,KLOR-CON) 20 MEQ tablet Take 20 mEq by mouth daily.    . simvastatin (ZOCOR) 10 MG tablet Take 0.5 tablets (5 mg total) by mouth daily at 6 PM. 45 tablet 3  . gabapentin (NEURONTIN) 600 MG tablet TAKE 1 TABLET (600 MG TOTAL) BY MOUTH THREE   TIMES DAILY. PLEASE DOSE AT 8AM, 2PM AND 8PM 270 tablet 3   No facility-administered medications prior to visit.    PAST MEDICAL HISTORY: Past  Medical History  Diagnosis Date  . COPD (chronic obstructive pulmonary disease) ([place])     Never smoked. Questionable diagnosis.  . Atrial fibrillation ([place])   . Osteopenia   . Insomnia   . Hyponatremia   . Hypertension   . UTI (lower urinary tract infection)   . Bursitis of hip   . Trigeminal neuralgia   . Altered mental status 08/24/2012  . Anemia, with significant drop in H/H 08/24/2012  . CHF (congestive heart failure) ([place])   . Heart disease   . Acute facial pain   . LBBB (left bundle branch block)   . Nonischemic cardiomyopathy ([place])   . Dyslipidemia   . Kidney failure     Stage 3  . Skin cancer of nose   . Complication of anesthesia     "I'm slow to recover when I'm put to sleep"  . On home oxygen therapy     "2L at night and prn during the day" (02/28/2016)  . Arthritis     "fingers" (02/28/2016)  PAST SURGICAL HISTORY: Past Surgical History  Procedure Laterality Date  . Appendectomy    . Cardiac catheterization  07/04/2012    normal coronaries, EF 30-35%, elevated R & L heart pressures, reduced CO, mild pulm HTN (Dr. K. Mali Hilty)  . Transthoracic echocardiogram  2013    mild conc LVH, RV mildly dilated; LA severely dilated; RA mod dilated; mild-mod mitral annular calcif, calcif of anterior & posterior MV leaflet, mod MR; mod-severe TR with RSVP 30-64mmHg; mild calcif of AV leaflets and mod aortic regurg; mild pulm valve regurg; pericardial effusion (right)  . Nm myocar perf wall motion  2011    lexiscan myoview - normal LV systolic function w/normal wall motion, no evidence of scar/ischemia  . Left and right heart catheterization with coronary angiogram N/A 07/04/2012    Procedure: LEFT AND RIGHT HEART CATHETERIZATION WITH CORONARY ANGIOGRAM;  Surgeon: Pixie Casino, MD;  Location: [facility] CATH LAB;  Service: Cardiovascular;  Laterality: N/A;  . Tonsillectomy and adenoidectomy    . Breast surgery Right     "took out some nodes"  . Skin cancer excision      "nose"  .  Dilation and curettage of uterus    . Cataract extraction w/ intraocular lens  implant, bilateral Bilateral     FAMILY HISTORY: Family History  Problem Relation Age of Onset  . Stroke Father   . Heart attack Father   . Heart attack Brother   . Heart attack Brother   . Alzheimer's disease Mother     SOCIAL HISTORY: Social History   Social History  . Marital Status: Widowed    Spouse Name: N/A  . Number of Children: 3  . Years of Education: 12   Occupational History  . Not on file.   Social History Main Topics  . Smoking status: Never Smoker   . Smokeless tobacco: Never Used  . Alcohol Use: No  . Drug Use: No  . Sexual Activity: No   Other Topics Concern  . Not on file   Social History Narrative   Patient is widowed.    Patient has 3 children.    Patient is retired.      PHYSICAL EXAM  Filed Vitals:   06/19/16 1437  BP: 125/68  Pulse: 95  Height: 5\' 3"  (1.6 m)  Weight: 135 lb 6.4 oz (61.417 kg)   Body mass index is 23.99 kg/(m^2). Gen: NAD, conversant, well nourised,well groomed  Cardiovascular: Regular rate rhythm, Neck: Supple, no carotid bruit. NEUROLOGICAL EXAM:  MENTAL STATUS: SpeechSpeech is normal; fluent and spontaneous with normal comprehension.  Cognition:Orientation to time, place and person Normal recent and remote memory Normal Attention span and concentration, Normal Language, naming, repeating,spontaneous speech Fund of knowledge  CRANIAL NERVES: CN II: Visual fields are full to confrontation. Pupils are round equal and briskly reactive to light. CN III, IV, VI: extraocular movement are normal. No ptosis. CN V: Facial sensation is intact to pinprick in all 3 divisions bilaterally. .  CN VII: Face is symmetric with normal eye closure and smile. CN VIII: Hearing is normal to rubbing fingers CN IX, X: Palate elevates symmetrically. Phonation is normal. CN XI: Head turning and shoulder shrug are intact CN XII:  Tongue is midline with normal movements and no atrophy.  MOTOR:There is no pronator drift of out-stretched arms. Muscle bulk and tone are normal. Muscle strength is normal. REFLEXES:Reflexes are 2+ and symmetric at the biceps, triceps, knees, and ankles. Plantar responses are flexor. SENSORY:Intact to light touch.  COORDINATION:Rapid alternating movements and fine finger movements are intact. There is no dysmetria on finger-to-nose and heel-knee-shin.  GAIT/STANCE: Slightly stooped posture, steady with turns no assistive device  DIAGNOSTIC DATA (LABS, IMAGING, TESTING) - I reviewed patient records, labs, notes, testing and imaging myself where available.  Lab Results  Component Value Date   WBC 5.9 03/01/2016   HGB 9.3* 03/01/2016   HCT 28.1* 03/01/2016   MCV 95.9 03/01/2016   PLT 151 03/01/2016      Component Value Date/Time   NA 133* 03/02/2016 0917   NA 133* 10/25/2015 1637   K 4.3 03/02/2016 0917   CL 99* 03/02/2016 0917   CO2 23 03/02/2016 0917   GLUCOSE 117* 03/02/2016 0917   GLUCOSE 94 10/25/2015 1637   BUN 60* 03/02/2016 0917   BUN 27 10/25/2015 1637   CREATININE 1.84* 03/02/2016 0917   CREATININE 1.69* 11/08/2015 0900   CALCIUM 9.1 03/02/2016 0917   PROT 8.4* 01/20/2016 2240   PROT 8.5 10/25/2015 1637   ALBUMIN 3.2* 01/20/2016 2240   ALBUMIN 3.8 10/25/2015 1637   AST 29 01/20/2016 2240   ALT 17 01/20/2016 2240   ALKPHOS 78 01/20/2016 2240   BILITOT 0.6 01/20/2016 2240   BILITOT 0.3 10/25/2015 1637   GFRNONAA 24* 03/02/2016 0917   GFRAA 28* 03/02/2016 0917    Lab Results  Component Value Date   TSH 0.619 02/28/2016      ASSESSMENT AND PLAN  82 y.o. year old female  has a past medical history of Trigeminal neuralgia here to follow-up. Her symptoms are well controlled on Trileptal and gabapentin.  PLAN: Continue gabapentin at current dose Continue Trileptal at current dose will refill Follow-up in 6-8 months Dennie Bible, [facility] [address], [facility],  APRN  [facility] [address] [place], [place] [address] [phone]

## 2016-06-20 NOTE — Progress Notes (Signed)
I have reviewed and agreed above plan. 

## 2016-06-29 ENCOUNTER — Other Ambulatory Visit: Payer: Self-pay | Admitting: Radiology

## 2016-06-30 ENCOUNTER — Encounter (HOSPITAL_COMMUNITY): Payer: Self-pay

## 2016-06-30 ENCOUNTER — Ambulatory Visit (HOSPITAL_COMMUNITY)
Admission: RE | Admit: 2016-06-30 | Discharge: 2016-06-30 | Disposition: A | Discharge status: Home / Self Care | Payer: Medicare Other | Source: Ambulatory Visit | Attending: Internal Medicine | Admitting: Internal Medicine

## 2016-06-30 DIAGNOSIS — J449 Chronic obstructive pulmonary disease, unspecified: Secondary | ICD-10-CM | POA: Insufficient documentation

## 2016-06-30 DIAGNOSIS — I509 Heart failure, unspecified: Secondary | ICD-10-CM | POA: Insufficient documentation

## 2016-06-30 DIAGNOSIS — I130 Hypertensive heart and chronic kidney disease with heart failure and stage 1 through stage 4 chronic kidney disease, or unspecified chronic kidney disease: Secondary | ICD-10-CM | POA: Diagnosis not present

## 2016-06-30 DIAGNOSIS — E785 Hyperlipidemia, unspecified: Secondary | ICD-10-CM | POA: Insufficient documentation

## 2016-06-30 DIAGNOSIS — C480 Malignant neoplasm of retroperitoneum: Secondary | ICD-10-CM | POA: Insufficient documentation

## 2016-06-30 DIAGNOSIS — M1990 Unspecified osteoarthritis, unspecified site: Secondary | ICD-10-CM | POA: Insufficient documentation

## 2016-06-30 DIAGNOSIS — N183 Chronic kidney disease, stage 3 (moderate): Secondary | ICD-10-CM | POA: Insufficient documentation

## 2016-06-30 DIAGNOSIS — Z79899 Other long term (current) drug therapy: Secondary | ICD-10-CM | POA: Diagnosis not present

## 2016-06-30 DIAGNOSIS — Z880 Allergy status to penicillin: Secondary | ICD-10-CM | POA: Insufficient documentation

## 2016-06-30 DIAGNOSIS — I429 Cardiomyopathy, unspecified: Secondary | ICD-10-CM | POA: Insufficient documentation

## 2016-06-30 DIAGNOSIS — R591 Generalized enlarged lymph nodes: Secondary | ICD-10-CM | POA: Diagnosis present

## 2016-06-30 DIAGNOSIS — Z7901 Long term (current) use of anticoagulants: Secondary | ICD-10-CM | POA: Insufficient documentation

## 2016-06-30 DIAGNOSIS — R599 Enlarged lymph nodes, unspecified: Secondary | ICD-10-CM

## 2016-06-30 DIAGNOSIS — I4891 Unspecified atrial fibrillation: Secondary | ICD-10-CM | POA: Insufficient documentation

## 2016-06-30 LAB — BASIC METABOLIC PANEL
ANION GAP: 9 (ref 5–15)
BUN: 24 mg/dL — ABNORMAL HIGH (ref 6–20)
CALCIUM: 9.4 mg/dL (ref 8.9–10.3)
CHLORIDE: 93 mmol/L — AB (ref 101–111)
CO2: 32 mmol/L (ref 22–32)
CREATININE: 1.47 mg/dL — AB (ref 0.44–1.00)
GFR calc non Af Amer: 32 mL/min — ABNORMAL LOW (ref >60)
GFR, EST AFRICAN AMERICAN: 37 mL/min — AB (ref >60)
Glucose, Bld: 98 mg/dL (ref 65–99)
Potassium: 3.2 mmol/L — ABNORMAL LOW (ref 3.5–5.1)
SODIUM: 134 mmol/L — AB (ref 135–145)

## 2016-06-30 LAB — CBC WITH DIFFERENTIAL/PLATELET
BASOS PCT: 0 %
Basophils Absolute: 0 10*3/uL (ref 0.0–0.1)
EOS ABS: 0.3 10*3/uL (ref 0.0–0.7)
Eosinophils Relative: 4 %
HEMATOCRIT: 32.9 % — AB (ref 36.0–46.0)
HEMOGLOBIN: 10.8 g/dL — AB (ref 12.0–15.0)
Lymphocytes Relative: 23 %
Lymphs Abs: 1.5 10*3/uL (ref 0.7–4.0)
MCH: 32.2 pg (ref 26.0–34.0)
MCHC: 32.8 g/dL (ref 30.0–36.0)
MCV: 98.2 fL (ref 78.0–100.0)
Monocytes Absolute: 1 10*3/uL (ref 0.1–1.0)
Monocytes Relative: 15 %
NEUTROS ABS: 3.8 10*3/uL (ref 1.7–7.7)
NEUTROS PCT: 58 %
Platelets: 204 10*3/uL (ref 150–400)
RBC: 3.35 MIL/uL — AB (ref 3.87–5.11)
RDW: 13.8 % (ref 11.5–15.5)
WBC: 6.6 10*3/uL (ref 4.0–10.5)

## 2016-06-30 LAB — PROTIME-INR
INR: 1.18
Prothrombin Time: 15 seconds (ref 11.4–15.2)

## 2016-06-30 MED ORDER — SODIUM CHLORIDE 0.9 % IV SOLN
INTRAVENOUS | Status: DC
  Administered 2016-06-30: 10:00:00 via INTRAVENOUS

## 2016-06-30 MED ORDER — LIDOCAINE HCL 1 % IJ SOLN
INTRAMUSCULAR | Status: AC
  Filled 2016-06-30: qty 20

## 2016-06-30 MED ORDER — FENTANYL CITRATE (PF) 100 MCG/2ML IJ SOLN
INTRAMUSCULAR | Status: AC
  Filled 2016-06-30: qty 4

## 2016-06-30 MED ORDER — MIDAZOLAM HCL 2 MG/2ML IJ SOLN
INTRAMUSCULAR | Status: AC | PRN
  Administered 2016-06-30: 1 mg via INTRAVENOUS

## 2016-06-30 MED ORDER — FENTANYL CITRATE (PF) 100 MCG/2ML IJ SOLN
INTRAMUSCULAR | Status: AC | PRN
  Administered 2016-06-30: 50 ug via INTRAVENOUS

## 2016-06-30 MED ORDER — MIDAZOLAM HCL 2 MG/2ML IJ SOLN
INTRAMUSCULAR | Status: AC
  Filled 2016-06-30: qty 4

## 2016-06-30 NOTE — H&P (Signed)
Chief Complaint: RTP LAD  Referring Physician:Dr. Burnard Bunting  Supervising Physician: Corrie Mckusick  Patient Status: Out-pt  HPI: Angela Franco is an 82 y.o. female who was admitted earlier this year with respiratory failure secondary to the flu.  She was also found to later have RSV as well.  In her work up for all of this she had a CT scan that revealed retroperitoneal lymphadenopathy.  She was referred back to her PCP for follow up on this.  A request was made for a biopsy, hence her presence here today.  She has no new complaints today and is otherwise in her normal health.  Past Medical History:  Past Medical History:  Diagnosis Date  . Acute facial pain   . Altered mental status 08/24/2012  . Anemia, with significant drop in H/H 08/24/2012  . Arthritis    "fingers" (02/28/2016)  . Atrial fibrillation ([place])   . Bursitis of hip   . CHF (congestive heart failure) ([place])   . Complication of anesthesia    "I'm slow to recover when I'm put to sleep"  . COPD (chronic obstructive pulmonary disease) ([place])    Never smoked. Questionable diagnosis.  [name] Dyslipidemia   . Heart disease   . Hypertension   . Hyponatremia   . Insomnia   . Kidney failure    Stage 3  . LBBB (left bundle branch block)   . Nonischemic cardiomyopathy ([place])   . On home oxygen therapy    "2L at night and prn during the day" (02/28/2016)  . Osteopenia   . Skin cancer of nose   . Trigeminal neuralgia   . UTI (lower urinary tract infection)     Past Surgical History:  Past Surgical History:  Procedure Laterality Date  . APPENDECTOMY    . BREAST SURGERY Right    "took out some nodes"  . CARDIAC CATHETERIZATION  07/04/2012   normal coronaries, EF 30-35%, elevated R & L heart pressures, reduced CO, mild pulm HTN (Dr. K. Mali Hilty)  . CATARACT EXTRACTION W/ INTRAOCULAR LENS  IMPLANT, BILATERAL Bilateral   . DILATION AND CURETTAGE OF UTERUS    . LEFT AND RIGHT HEART CATHETERIZATION WITH CORONARY  ANGIOGRAM N/A 07/04/2012   Procedure: LEFT AND RIGHT HEART CATHETERIZATION WITH CORONARY ANGIOGRAM;  Surgeon: Pixie Casino, MD;  Location: [facility] CATH LAB;  Service: Cardiovascular;  Laterality: N/A;  . NM MYOCAR PERF WALL MOTION  2011   lexiscan myoview - normal LV systolic function w/normal wall motion, no evidence of scar/ischemia  . SKIN CANCER EXCISION     "nose"  . TONSILLECTOMY AND ADENOIDECTOMY    . TRANSTHORACIC ECHOCARDIOGRAM  2013   mild conc LVH, RV mildly dilated; LA severely dilated; RA mod dilated; mild-mod mitral annular calcif, calcif of anterior & posterior MV leaflet, mod MR; mod-severe TR with RSVP 30-91mmHg; mild calcif of AV leaflets and mod aortic regurg; mild pulm valve regurg; pericardial effusion (right)    Family History:  Family History  Problem Relation Age of Onset  . Stroke Father   . Heart attack Father   . Alzheimer's disease Mother   . Heart attack Brother   . Heart attack Brother     Social History:  reports that she has never smoked. She has never used smokeless tobacco. She reports that she does not drink alcohol or use drugs.  Allergies:  Allergies  Allergen Reactions  . Penicillins Hives    Has patient had a PCN reaction causing immediate rash,  facial/tongue/throat swelling, SOB or lightheadedness with hypotension: No Has patient had a PCN reaction causing severe rash involving mucus membranes or skin necrosis: No Has patient had a PCN reaction that required hospitalization No Has patient had a PCN reaction occurring within the last 10 years: No If all of the above answers are "NO", then may proceed with Cephalosporin use.    Medications:   Medication List    ASK your doctor about these medications   acetaminophen-codeine 300-30 MG tablet Commonly known as:  TYLENOL #3 Take 1-2 tablets by mouth every 6 (six) hours as needed for moderate pain.   allopurinol 100 MG tablet Commonly known as:  ZYLOPRIM Take 200 mg by mouth daily.     apixaban 2.5 MG Tabs tablet Commonly known as:  ELIQUIS Take 1 tablet (2.5 mg total) by mouth 2 (two) times daily.   calcium-vitamin D 500-200 MG-UNIT tablet Commonly known as:  OSCAL WITH D Take 1 tablet by mouth daily with breakfast.   carvedilol 6.25 MG tablet Commonly known as:  COREG Take 1 tablet (6.25 mg total) by mouth 2 (two) times daily with a meal.   ferrous sulfate 325 (65 FE) MG tablet Take 325 mg by mouth daily with breakfast.   furosemide 80 MG tablet Commonly known as:  LASIX Take 1 tablet (80 mg total) by mouth daily.   gabapentin 600 MG tablet Commonly known as:  NEURONTIN Take 1 tablet (600 mg total) by mouth 3 (three) times daily. Please dose at 8am, 2pm and 8pm   ipratropium-albuterol 0.5-2.5 (3) MG/3ML Soln Commonly known as:  DUONEB Take 3 mLs by nebulization every 6 (six) hours as needed.   Melatonin 10 MG Tabs Take 10 mg by mouth at bedtime.   OXcarbazepine 150 MG tablet Commonly known as:  TRILEPTAL Take 0.5 tablet twice daily.   potassium chloride SA 20 MEQ tablet Commonly known as:  K-DUR,KLOR-CON Take 20 mEq by mouth daily.   simvastatin 10 MG tablet Commonly known as:  ZOCOR Take 0.5 tablets (5 mg total) by mouth daily at 6 PM.       Please HPI for pertinent positives, otherwise complete 10 system ROS negative.  Mallampati Score: MD Evaluation Airway: WNL Heart: WNL Abdomen: WNL Chest/ Lungs: WNL ASA  Classification: 3 Mallampati/Airway Score: Two  Physical Exam: Vitals pending General: pleasant, WD, WN, elderly white female who is laying in bed in NAD HEENT: head is normocephalic, atraumatic.  Sclera are noninjected.  PERRL.  Ears and nose without any masses or lesions.  Mouth is pink and moist Heart: irregularly irregular.  Normal s1,s2. No obvious murmurs, gallops, or rubs noted.  Palpable radial and pedal pulses bilaterally Lungs: CTAB, no wheezes, rhonchi, or rales noted.  Respiratory effort nonlabored Abd: soft, NT,  ND, +BS, no masses, hernias, or organomegaly MS: all 4 extremities are symmetrical with no cyanosis, clubbing, or edema. Psych: A&Ox3 with an appropriate affect.   Labs: Results for orders placed or performed during the hospital encounter of 06/30/16 (from the past 48 hour(s))  CBC with Differential/Platelet     Status: Abnormal   Collection Time: 06/30/16 10:15 AM  Result Value Ref Range   WBC 6.6 4.0 - 10.5 K/uL   RBC 3.35 (L) 3.87 - 5.11 MIL/uL   Hemoglobin 10.8 (L) 12.0 - 15.0 g/dL   HCT 32.9 (L) 36.0 - 46.0 %   MCV 98.2 78.0 - 100.0 fL   MCH 32.2 26.0 - 34.0 pg   MCHC 32.8 30.0 - 36.0  g/dL   RDW 13.8 11.5 - 15.5 %   Platelets 204 150 - 400 K/uL   Neutrophils Relative % 58 %   Neutro Abs 3.8 1.7 - 7.7 K/uL   Lymphocytes Relative 23 %   Lymphs Abs 1.5 0.7 - 4.0 K/uL   Monocytes Relative 15 %   Monocytes Absolute 1.0 0.1 - 1.0 K/uL   Eosinophils Relative 4 %   Eosinophils Absolute 0.3 0.0 - 0.7 K/uL   Basophils Relative 0 %   Basophils Absolute 0.0 0.0 - 0.1 K/uL    Imaging: No results found.  Assessment/Plan 1. Retroperitoneal lymphadenopathy -we will plan to proceed with biopsy today -she is on eliquis and this has been held for 48 hrs. -labs are pending -Risks and Benefits discussed with the patient including, but not limited to bleeding, infection, damage to adjacent structures or low yield requiring additional tests. All of the patient's questions were answered, patient is agreeable to proceed. Consent signed and in chart.   Thank you for this interesting consult.  I greatly enjoyed meeting Zaydi Gilleland and look forward to participating in their care.  A copy of this report was sent to the requesting provider on this date.  Electronically Signed: Henreitta Cea 06/30/2016, 10:34 AM   I spent a total of  30 Minutes   in face to face in clinical consultation, greater than 50% of which was counseling/coordinating care for retroperitoneal lymphadenopathy

## 2016-06-30 NOTE — Procedures (Signed)
Interventional Radiology Procedure Note  Procedure: CT guided biopsy of left retroperitoneal lymph nodes.  Complications: None Recommendations:  - Ok to shower tomorrow - Do not submerge for 7 days - Routine wound care  - Follow up pathology - 1 hour obs  Signed,  Dulcy Fanny. Earleen Newport, DO

## 2016-06-30 NOTE — Discharge Instructions (Signed)
Needle Biopsy, Care After °These instructions give you information about caring for yourself after your procedure. Your doctor may also give you more specific instructions. Call your doctor if you have any problems or questions after your procedure. °HOME CARE °· Rest as told by your doctor. °· Take medicines only as told by your doctor. °· There are many different ways to close and cover the biopsy site, including stitches (sutures), skin glue, and adhesive strips. Follow instructions from your doctor about: °¨ How to take care of your biopsy site. °¨ When and how you should change your bandage (dressing). °¨ When you should remove your dressing. °¨ Removing whatever was used to close your biopsy site. °· Check your biopsy site every day for signs of infection. Watch for: °¨ Redness, swelling, or pain. °¨ Fluid, blood, or pus. °GET HELP IF: °· You have a fever. °· You have redness, swelling, or pain at the biopsy site, and it lasts longer than a few days. °· You have fluid, blood, or pus coming from the biopsy site. °· You feel sick to your stomach (nauseous). °· You throw up (vomit). °GET HELP RIGHT AWAY IF: °· You are short of breath. °· You have trouble breathing. °· Your chest hurts. °· You feel dizzy or you pass out (faint). °· You have bleeding that does not stop with pressure or a bandage. °· You cough up blood. °· Your belly (abdomen) hurts. °  °This information is not intended to replace advice given to you by your health care provider. Make sure you discuss any questions you have with your health care provider. °  °Document Released: 11/02/2008 Document Revised: 04/06/2015 Document Reviewed: 11/16/2014 °Elsevier Interactive Patient Education ©2016 [employer] ° °

## 2016-07-17 ENCOUNTER — Ambulatory Visit: Payer: Medicare Other | Admitting: Nurse Practitioner

## 2016-07-18 ENCOUNTER — Emergency Department (HOSPITAL_COMMUNITY)
Admission: EM | Admit: 2016-07-18 | Discharge: 2016-07-18 | Disposition: A | Discharge status: Home / Self Care | Payer: Medicare Other | Source: Home / Self Care | Attending: Emergency Medicine | Admitting: Emergency Medicine

## 2016-07-18 ENCOUNTER — Emergency Department (HOSPITAL_COMMUNITY): Payer: Medicare Other

## 2016-07-18 ENCOUNTER — Encounter (HOSPITAL_COMMUNITY): Payer: Self-pay

## 2016-07-18 ENCOUNTER — Ambulatory Visit (INDEPENDENT_AMBULATORY_CARE_PROVIDER_SITE_OTHER): Payer: Medicare Other | Admitting: Family Medicine

## 2016-07-18 VITALS — BP 122/66 | HR 76 | Temp 97.9°F | Resp 16 | Ht 62.0 in | Wt 135.0 lb

## 2016-07-18 DIAGNOSIS — I5043 Acute on chronic combined systolic (congestive) and diastolic (congestive) heart failure: Secondary | ICD-10-CM | POA: Insufficient documentation

## 2016-07-18 DIAGNOSIS — I130 Hypertensive heart and chronic kidney disease with heart failure and stage 1 through stage 4 chronic kidney disease, or unspecified chronic kidney disease: Secondary | ICD-10-CM

## 2016-07-18 DIAGNOSIS — Z85828 Personal history of other malignant neoplasm of skin: Secondary | ICD-10-CM | POA: Insufficient documentation

## 2016-07-18 DIAGNOSIS — R109 Unspecified abdominal pain: Secondary | ICD-10-CM

## 2016-07-18 DIAGNOSIS — Z7901 Long term (current) use of anticoagulants: Secondary | ICD-10-CM | POA: Insufficient documentation

## 2016-07-18 DIAGNOSIS — A419 Sepsis, unspecified organism: Secondary | ICD-10-CM | POA: Diagnosis not present

## 2016-07-18 DIAGNOSIS — R110 Nausea: Secondary | ICD-10-CM | POA: Insufficient documentation

## 2016-07-18 DIAGNOSIS — R1084 Generalized abdominal pain: Secondary | ICD-10-CM | POA: Insufficient documentation

## 2016-07-18 DIAGNOSIS — N183 Chronic kidney disease, stage 3 (moderate): Secondary | ICD-10-CM

## 2016-07-18 DIAGNOSIS — J449 Chronic obstructive pulmonary disease, unspecified: Secondary | ICD-10-CM | POA: Insufficient documentation

## 2016-07-18 DIAGNOSIS — R509 Fever, unspecified: Secondary | ICD-10-CM | POA: Diagnosis not present

## 2016-07-18 LAB — URINALYSIS, ROUTINE W REFLEX MICROSCOPIC
Bilirubin Urine: NEGATIVE
GLUCOSE, UA: NEGATIVE mg/dL
Hgb urine dipstick: NEGATIVE
KETONES UR: NEGATIVE mg/dL
Leukocytes, UA: NEGATIVE
NITRITE: NEGATIVE
PH: 7.5 (ref 5.0–8.0)
PROTEIN: NEGATIVE mg/dL
SPECIFIC GRAVITY, URINE: 1.012 (ref 1.005–1.030)

## 2016-07-18 LAB — COMPREHENSIVE METABOLIC PANEL
ALBUMIN: 3.7 g/dL (ref 3.5–5.0)
ALT: 18 U/L (ref 14–54)
ANION GAP: 11 (ref 5–15)
AST: 31 U/L (ref 15–41)
Alkaline Phosphatase: 73 U/L (ref 38–126)
BUN: 24 mg/dL — AB (ref 6–20)
CHLORIDE: 91 mmol/L — AB (ref 101–111)
CO2: 28 mmol/L (ref 22–32)
Calcium: 9.8 mg/dL (ref 8.9–10.3)
Creatinine, Ser: 1.59 mg/dL — ABNORMAL HIGH (ref 0.44–1.00)
GFR calc Af Amer: 33 mL/min — ABNORMAL LOW (ref >60)
GFR calc non Af Amer: 29 mL/min — ABNORMAL LOW (ref >60)
GLUCOSE: 106 mg/dL — AB (ref 65–99)
POTASSIUM: 3.4 mmol/L — AB (ref 3.5–5.1)
SODIUM: 130 mmol/L — AB (ref 135–145)
Total Bilirubin: 0.6 mg/dL (ref 0.3–1.2)
Total Protein: 8.1 g/dL (ref 6.5–8.1)

## 2016-07-18 LAB — LIPASE, BLOOD: Lipase: 36 U/L (ref 11–51)

## 2016-07-18 LAB — CBC
HEMATOCRIT: 33.1 % — AB (ref 36.0–46.0)
HEMOGLOBIN: 10.5 g/dL — AB (ref 12.0–15.0)
MCH: 31 pg (ref 26.0–34.0)
MCHC: 31.7 g/dL (ref 30.0–36.0)
MCV: 97.6 fL (ref 78.0–100.0)
Platelets: 241 10*3/uL (ref 150–400)
RBC: 3.39 MIL/uL — ABNORMAL LOW (ref 3.87–5.11)
RDW: 14.4 % (ref 11.5–15.5)
WBC: 5.8 10*3/uL (ref 4.0–10.5)

## 2016-07-18 MED ORDER — GABAPENTIN 600 MG PO TABS
600.0000 mg | ORAL_TABLET | Freq: Once | ORAL | Status: AC
  Administered 2016-07-18: 600 mg via ORAL
  Filled 2016-07-18: qty 1

## 2016-07-18 MED ORDER — ONDANSETRON 4 MG PO TBDP
8.0000 mg | ORAL_TABLET | Freq: Once | ORAL | Status: AC
  Administered 2016-07-18: 8 mg via ORAL
  Filled 2016-07-18: qty 2

## 2016-07-18 MED ORDER — ONDANSETRON HCL 4 MG PO TABS: 4.0000 mg | ORAL_TABLET | Freq: Four times a day (QID) | ORAL | 0 refills | Status: DC

## 2016-07-18 MED ORDER — MORPHINE SULFATE (PF) 2 MG/ML IV SOLN
2.0000 mg | Freq: Once | INTRAVENOUS | Status: AC
  Administered 2016-07-18: 2 mg via INTRAVENOUS
  Filled 2016-07-18: qty 1

## 2016-07-18 MED ORDER — TRAMADOL HCL 50 MG PO TABS: 50.0000 mg | ORAL_TABLET | Freq: Four times a day (QID) | ORAL | 0 refills | Status: DC | PRN

## 2016-07-18 NOTE — Discharge Instructions (Signed)
You can take Tylenol with Tramadol if your pain is still severe

## 2016-07-18 NOTE — ED Notes (Signed)
Pt returned from CT and place back on monitor.

## 2016-07-18 NOTE — ED Notes (Signed)
Dr. Zavits at bedside   

## 2016-07-18 NOTE — ED Triage Notes (Addendum)
Per [facility]: Pt complaining of abdominal pain that started last nigh, been going on since then. Pt went to urgent care today and urgent care called EMS. Pt states that it is generalized abdominal pain, pain is "cramping", pt is tender to palpation to all quadrants, pain is not in one particular quadrant, rates pain 10/10. Pt was recently dx with cancer, unsure of origin of cancer, pts family did not tell the pt about the cancer diagnoses and does not want the pt to know, per the pts son. They did a biopsy of a lymph node in her abdomen and found cancer there.

## 2016-07-18 NOTE — Progress Notes (Signed)
Patient ID: Angela Franco, female    DOB: 03/02/1929, 82 y.o.   MRN: [id number]  PCP: Geoffery Lyons, MD  Chief Complaint  Patient presents with  . Abdominal Pain    started last night about 9pm    Subjective:   HPI Presents for evaluation of abdominal pain x 1-2 weeks.  She has been taking Tylenol three times per day for abdominal pain with no improvement in pain level. Reports pain as 10/10.  She took tylenol with codeine last night and reports no improvement.  She attempted to eat breakfast, and vomited.  Pain is felt bilateral lower quadrants of abdomen. No position make pain better. No diarrhea or fever. Reports regular daily bowel patterns. Denies blood in stool.  Son accompanied patient at visit and reports privately that patient had a biopsy last week that confirmed cancer of lymph nodes. The family and physician elected not to tell that patient and she is unaware of the diagnosis.    . Social History   Social History  . Marital status: Widowed    Spouse name: N/A  . Number of children: 3  . Years of education: 12   Occupational History  . Not on file.   Social History Main Topics  . Smoking status: Never Smoker  . Smokeless tobacco: Never Used  . Alcohol use No  . Drug use: No  . Sexual activity: No   Other Topics Concern  . Not on file   Social History Narrative   Patient is widowed.    Patient has 3 children.    Patient is retired.    . Family History  Problem Relation Age of Onset  . Stroke Father   . Heart attack Father   . Alzheimer's disease Mother   . Heart attack Brother   . Heart attack Brother    Review of Systems  Respiratory: Negative.   Cardiovascular: Negative.   Gastrointestinal: Positive for abdominal pain, nausea and vomiting.  Genitourinary: Negative.   Neurological: Negative.     Patient Active Problem List   Diagnosis Date Noted  . Acute respiratory failure with hypoxia ([place]) 02/28/2016  . Acute renal failure  superimposed on stage 3 chronic kidney disease ([place]) 02/28/2016  . Acute on chronic diastolic heart failure ([place])   . Acute on chronic systolic heart failure ([place])   . Respiratory syncytial virus (RSV) infection   . Chronic systolic heart failure ([place])   . COPD exacerbation ([place]) 01/21/2016  . Chronic renal impairment, stage 3 (moderate) 11/10/2015  . COPD by CXR 08/26/2012  . Non-ischemic cardiomyopathy EF improved from 30-35% to 40-45% by Echo in 07/2012 06/30/2012  . LBBB (left bundle branch block) 06/30/2012  . Chronic anticoagulation 06/30/2012  . HTN (hypertension) 06/30/2012  . Systolic and diastolic CHF, acute on chronic,  06/29/2012  . Hypo-osmolality and hyponatremia 06/29/2012  . Normocytic anemia 06/29/2012  . Atrial fibrillation, chronic ([place]) 06/29/2012  . Trigeminal neuralgia 06/29/2012  . Moderate to severe pulmonary hypertension, PA pressure in 70s June 2013 06/29/2012     Prior to Admission medications   Medication Sig Start Date End Date Taking? Authorizing Provider  acetaminophen-codeine (TYLENOL #3) 300-30 MG tablet Take 1-2 tablets by mouth every 6 (six) hours as needed for moderate pain. 05/13/16  Yes Domenic Moras, PA-C  allopurinol (ZYLOPRIM) 100 MG tablet Take 200 mg by mouth daily. 05/19/16  Yes Historical Provider, MD  apixaban (ELIQUIS) 2.5 MG TABS tablet Take 1 tablet (2.5 mg total) by mouth 2 (two)  times daily. 11/16/15  Yes Pixie Casino, MD  calcium-vitamin D (OSCAL WITH D) 500-200 MG-UNIT tablet Take 1 tablet by mouth daily with breakfast.   Yes Historical Provider, MD  carvedilol (COREG) 6.25 MG tablet Take 1 tablet (6.25 mg total) by mouth 2 (two) times daily with a meal. 03/23/16  Yes Pixie Casino, MD  ferrous sulfate 325 (65 FE) MG tablet Take 325 mg by mouth daily with breakfast.   Yes Historical Provider, MD  furosemide (LASIX) 80 MG tablet Take 1 tablet (80 mg total) by mouth daily. 11/10/15 09/21/18 Yes Luke K Kilroy, PA-C  gabapentin (NEURONTIN)  600 MG tablet Take 1 tablet (600 mg total) by mouth 3 (three) times daily. Please dose at 8am, 2pm and 8pm 01/19/16  Yes Marcial Pacas, MD  ipratropium-albuterol (DUONEB) 0.5-2.5 (3) MG/3ML SOLN Take 3 mLs by nebulization every 6 (six) hours as needed. Patient taking differently: Take 3 mLs by nebulization every 6 (six) hours as needed (for wheezing or shortness of breath).  01/04/16  Yes Liberty Handy, MD  Melatonin 10 MG TABS Take 10 mg by mouth at bedtime.    Yes Historical Provider, MD  OXcarbazepine (TRILEPTAL) 150 MG tablet Take 0.5 tablet twice daily. Patient taking differently: Take 75 mg by mouth 2 (two) times daily.  06/19/16  Yes Dennie Bible, NP  potassium chloride SA (K-DUR,KLOR-CON) 20 MEQ tablet Take 20 mEq by mouth daily.   Yes Historical Provider, MD  simvastatin (ZOCOR) 10 MG tablet Take 0.5 tablets (5 mg total) by mouth daily at 6 PM. 11/24/15  Yes Pixie Casino, MD     Allergies  Allergen Reactions  . Penicillins Hives    Has patient had a PCN reaction causing immediate rash, facial/tongue/throat swelling, SOB or lightheadedness with hypotension: No Has patient had a PCN reaction causing severe rash involving mucus membranes or skin necrosis: No Has patient had a PCN reaction that required hospitalization No Has patient had a PCN reaction occurring within the last 10 years: No If all of the above answers are "NO", then may proceed with Cephalosporin use.       Objective:  Physical Exam  Constitutional: She is oriented to person, place, and time. She appears well-developed.  HENT:  Head: Normocephalic.  Neck: Normal range of motion. Neck supple.  Cardiovascular: Normal rate, regular rhythm, normal heart sounds and intact distal pulses.   Pulmonary/Chest: Effort normal and breath sounds normal.  Abdominal: Soft. There is tenderness. There is guarding.  Hyperactive bowel sound x 4 quads Tenderness x 4 quads with pronounced pain in left lower quadrant with palpation.    Musculoskeletal: Normal range of motion.  Neurological: She is alert and oriented to person, place, and time.  Skin: Skin is warm and dry.     Vitals:   07/18/16 1212  BP: 122/66  Pulse: 76  Resp: 16  Temp: 97.9 F (36.6 C)     Assessment & Plan:  1. Abdominal pain, unspecified abdominal location Patient appears in significant distress and unable to tolerated complete abdominal exam. She reports tenderness with palpation in right and left lower quadrant with increased tenderness in left lower quadrant. She vomited clear emesis after exam and began to experience dyspnea while at rest.  EMS called emergently. Report called to [facility] ED.  Carroll Sage. Kenton Kingfisher, MSN, FNP-C [facility]

## 2016-07-18 NOTE — ED Provider Notes (Signed)
[facility] DEPT Provider Note   CSN: [id number] Arrival date & time: 07/18/16  1318     History   Chief Complaint Chief Complaint  Patient presents with  . Abdominal Pain    HPI Angela Franco is a 82 y.o. female who presents with abdominal pain for the past 1-2 weeks. PMH significant for CKD stage 3, COPD, CHF, A. Fib currently on anticoagulation, chronic anemia, trigeminal neuralgia. Also of note she has a questionable diagnosis of lymphoma. She had a recent biopsy on 7/28 of retroperitoneal lymph nodes which showed malignant cells but per her son they were unable to tell where the cancer was coming from. The family and her PCP elected to not tell the patient that she has cancer. She states her abdominal pain is across her lower abdomen. It is intermittent however since yesterday has become severe and constant. She has been taking extra strength Tylenol TID with no relief. She reports associated nausea with "dry heaves" and decreased appetite. She denies fever, chills, chest pain, SOB, constipation/diarrhea, melena or hematochezia, irritative voiding symptoms. She has had an appendectomy. She has had a colonoscopy many years ago which she states was unremarkable.   HPI  Past Medical History:  Diagnosis Date  . Acute facial pain   . Altered mental status 08/24/2012  . Anemia, with significant drop in H/H 08/24/2012  . Arthritis    "fingers" (02/28/2016)  . Atrial fibrillation ([place])   . Bursitis of hip   . CHF (congestive heart failure) ([place])   . Complication of anesthesia    "I'm slow to recover when I'm put to sleep"  . COPD (chronic obstructive pulmonary disease) ([place])    Never smoked. Questionable diagnosis.  [name] Dyslipidemia   . Heart disease   . Hypertension   . Hyponatremia   . Insomnia   . Kidney failure    Stage 3  . LBBB (left bundle branch block)   . Nonischemic cardiomyopathy ([place])   . On home oxygen therapy    "2L at night and prn during the day" (02/28/2016)    . Osteopenia   . Skin cancer of nose   . Trigeminal neuralgia   . UTI (lower urinary tract infection)     Patient Active Problem List   Diagnosis Date Noted  . Acute respiratory failure with hypoxia ([place]) 02/28/2016  . Acute renal failure superimposed on stage 3 chronic kidney disease ([place]) 02/28/2016  . Acute on chronic diastolic heart failure ([place])   . Acute on chronic systolic heart failure ([place])   . Respiratory syncytial virus (RSV) infection   . Chronic systolic heart failure ([place])   . COPD exacerbation ([place]) 01/21/2016  . Chronic renal impairment, stage 3 (moderate) 11/10/2015  . COPD by CXR 08/26/2012  . Non-ischemic cardiomyopathy EF improved from 30-35% to 40-45% by Echo in 07/2012 06/30/2012  . LBBB (left bundle branch block) 06/30/2012  . Chronic anticoagulation 06/30/2012  . HTN (hypertension) 06/30/2012  . Systolic and diastolic CHF, acute on chronic,  06/29/2012  . Hypo-osmolality and hyponatremia 06/29/2012  . Normocytic anemia 06/29/2012  . Atrial fibrillation, chronic ([place]) 06/29/2012  . Trigeminal neuralgia 06/29/2012  . Moderate to severe pulmonary hypertension, PA pressure in 70s June 2013 06/29/2012    Past Surgical History:  Procedure Laterality Date  . APPENDECTOMY    . BREAST SURGERY Right    "took out some nodes"  . CARDIAC CATHETERIZATION  07/04/2012   normal coronaries, EF 30-35%, elevated R & L heart pressures, reduced CO,  mild pulm HTN (Dr. Raliegh Ip. Mali Hilty)  . CATARACT EXTRACTION W/ INTRAOCULAR LENS  IMPLANT, BILATERAL Bilateral   . DILATION AND CURETTAGE OF UTERUS    . LEFT AND RIGHT HEART CATHETERIZATION WITH CORONARY ANGIOGRAM N/A 07/04/2012   Procedure: LEFT AND RIGHT HEART CATHETERIZATION WITH CORONARY ANGIOGRAM;  Surgeon: Pixie Casino, MD;  [facility];  Service: Cardiovascular;  Laterality: N/A;  . NM MYOCAR PERF WALL MOTION  2011   lexiscan myoview - normal LV systolic function w/normal wall motion, no evidence of scar/ischemia   . SKIN CANCER EXCISION     "nose"  . TONSILLECTOMY AND ADENOIDECTOMY    . TRANSTHORACIC ECHOCARDIOGRAM  2013   mild conc LVH, RV mildly dilated; LA severely dilated; RA mod dilated; mild-mod mitral annular calcif, calcif of anterior & posterior MV leaflet, mod MR; mod-severe TR with RSVP 30-69mmHg; mild calcif of AV leaflets and mod aortic regurg; mild pulm valve regurg; pericardial effusion (right)    OB History    No data available       Home Medications    Prior to Admission medications   Medication Sig Start Date End Date Taking? Authorizing Provider  acetaminophen-codeine (TYLENOL #3) 300-30 MG tablet Take 1-2 tablets by mouth every 6 (six) hours as needed for moderate pain. 05/13/16   Domenic Moras, PA-C  allopurinol (ZYLOPRIM) 100 MG tablet Take 200 mg by mouth daily. 05/19/16   Historical Provider, MD  apixaban (ELIQUIS) 2.5 MG TABS tablet Take 1 tablet (2.5 mg total) by mouth 2 (two) times daily. 11/16/15   Pixie Casino, MD  calcium-vitamin D (OSCAL WITH D) 500-200 MG-UNIT tablet Take 1 tablet by mouth daily with breakfast.    Historical Provider, MD  carvedilol (COREG) 6.25 MG tablet Take 1 tablet (6.25 mg total) by mouth 2 (two) times daily with a meal. 03/23/16   Pixie Casino, MD  ferrous sulfate 325 (65 FE) MG tablet Take 325 mg by mouth daily with breakfast.    Historical Provider, MD  furosemide (LASIX) 80 MG tablet Take 1 tablet (80 mg total) by mouth daily. 11/10/15 09/21/18  Erlene Quan, PA-C  gabapentin (NEURONTIN) 600 MG tablet Take 1 tablet (600 mg total) by mouth 3 (three) times daily. Please dose at 8am, 2pm and 8pm 01/19/16   Marcial Pacas, MD  ipratropium-albuterol (DUONEB) 0.5-2.5 (3) MG/3ML SOLN Take 3 mLs by nebulization every 6 (six) hours as needed. Patient taking differently: Take 3 mLs by nebulization every 6 (six) hours as needed (for wheezing or shortness of breath).  01/04/16   Liberty Handy, MD  Melatonin 10 MG TABS Take 10 mg by mouth at bedtime.      Historical Provider, MD  OXcarbazepine (TRILEPTAL) 150 MG tablet Take 0.5 tablet twice daily. Patient taking differently: Take 75 mg by mouth 2 (two) times daily.  06/19/16   Dennie Bible, NP  potassium chloride SA (K-DUR,KLOR-CON) 20 MEQ tablet Take 20 mEq by mouth daily.    Historical Provider, MD  simvastatin (ZOCOR) 10 MG tablet Take 0.5 tablets (5 mg total) by mouth daily at 6 PM. 11/24/15   Pixie Casino, MD    Family History Family History  Problem Relation Age of Onset  . Stroke Father   . Heart attack Father   . Alzheimer's disease Mother   . Heart attack Brother   . Heart attack Brother     Social History Social History  Substance Use Topics  . Smoking status: Never Smoker  .  Smokeless tobacco: Never Used  . Alcohol use No     Allergies   Penicillins   Review of Systems Review of Systems  Constitutional: Positive for appetite change. Negative for chills, fever and unexpected weight change.  Respiratory: Negative for shortness of breath.   Cardiovascular: Negative for chest pain.  Gastrointestinal: Positive for abdominal pain, nausea and vomiting. Negative for abdominal distention and diarrhea.  Genitourinary: Negative for dysuria and frequency.  All other systems reviewed and are negative.    Physical Exam Updated Vital Signs BP 149/61   Pulse 60   Temp 98.6 F (37 C) (Oral)   Resp 23   Ht 5\' 2"  (1.575 m)   Wt 61.2 kg   SpO2 92%   BMI 24.69 kg/m   Physical Exam  Constitutional: She is oriented to person, place, and time. She appears well-developed and well-nourished. No distress.  HENT:  Head: Normocephalic and atraumatic.  Eyes: Conjunctivae are normal. Pupils are equal, round, and reactive to light. Right eye exhibits no discharge. Left eye exhibits no discharge. No scleral icterus.  Neck: Normal range of motion. Neck supple.  Cardiovascular: Normal rate and regular rhythm.  Exam reveals no gallop and no friction rub.   No murmur  heard. Pulmonary/Chest: Effort normal and breath sounds normal. No respiratory distress. She has no wheezes. She has no rales. She exhibits no tenderness.  Abdominal: Soft. Bowel sounds are normal. She exhibits no distension and no mass. There is tenderness. There is no rebound and no guarding. No hernia.  Generalized tenderness - states it makes her feel like she has to go to the bathroom  Musculoskeletal: She exhibits no edema.  Neurological: She is alert and oriented to person, place, and time.  Skin: Skin is warm and dry.  Psychiatric: She has a normal mood and affect. Her behavior is normal.  Nursing note and vitals reviewed.    ED Treatments / Results  Labs (all labs ordered are listed, but only abnormal results are displayed) Labs Reviewed  COMPREHENSIVE METABOLIC PANEL - Abnormal; Notable for the following:       Result Value   Sodium 130 (*)    Potassium 3.4 (*)    Chloride 91 (*)    Glucose, Bld 106 (*)    BUN 24 (*)    Creatinine, Ser 1.59 (*)    GFR calc non Af Amer 29 (*)    GFR calc Af Amer 33 (*)    All other components within normal limits  CBC - Abnormal; Notable for the following:    RBC 3.39 (*)    Hemoglobin 10.5 (*)    HCT 33.1 (*)    All other components within normal limits  LIPASE, BLOOD  URINALYSIS, ROUTINE W REFLEX MICROSCOPIC (NOT AT [facility])    EKG  EKG Interpretation None       Radiology Ct Abdomen Pelvis Wo Contrast  Result Date: 07/18/2016 CLINICAL DATA:  Abdominal pain. Recent retroperitoneal lymph node biopsy 06/30/2016 revealing adenocarcinoma EXAM: CT ABDOMEN AND PELVIS WITHOUT CONTRAST TECHNIQUE: Multidetector CT imaging of the abdomen and pelvis was performed following the standard protocol without IV contrast. COMPARISON:  CT abdomen 05/05/2016 FINDINGS: Lower chest: Marked cardiac enlargement. Marked right atrial enlargement. No pericardial effusion. Hepatobiliary: Unenhanced images of the liver demonstrate no liver mass. Normal liver  size. Gallbladder and bile ducts normal. Pancreas: Negative Spleen: Negative Adrenals/Urinary Tract: No renal obstruction. No mass or stone. Urinary bladder well distended without focal mass. Stomach/Bowel: Stomach and duodenum normal. Negative  for bowel obstruction. No bowel mass or edema. Vascular/Lymphatic: Atherosclerotic calcification in the aorta without aneurysm. Periaortic lymphadenopathy appears unchanged. Left periaortic lymph node 22 x 23 mm. Left lower periaortic lymph node 20 x 29 mm. Left hypogastric lymph node 17 mm. Lymph node anterior to the aorta below the SMA measures 13 x 25 mm. Reproductive: Normal uterus.  No pelvic mass. Other: No free fluid or free air.  Negative for hernia. Musculoskeletal: Moderate lumbar disc and facet degeneration. Negative for fracture or metastatic disease. No acute skeletal abnormality. IMPRESSION: Periaortic adenopathy similar to 05/05/2016. Recent biopsy revealed adenocarcinoma. Primary lesion not identified. No superimposed acute abnormality to explain the patient's acute abdominal pain Marked cardiac enlargement. Electronically Signed   By: Franchot Gallo M.D.   On: 07/18/2016 16:09    Procedures Procedures (including critical care time)  Medications Ordered in ED Medications  gabapentin (NEURONTIN) tablet 600 mg (600 mg Oral Given 07/18/16 1505)  morphine 2 MG/ML injection 2 mg (2 mg Intravenous Given 07/18/16 1502)  ondansetron (ZOFRAN-ODT) disintegrating tablet 8 mg (8 mg Oral Given 07/18/16 1717)     Initial Impression / Assessment and Plan / ED Course  I have reviewed the triage vital signs and the nursing notes.  Pertinent labs & imaging results that were available during my care of the patient were reviewed by me and considered in my medical decision making (see chart for details).  Clinical Course   82 year old female presents with abdominal pain. Unclear etiology. Patient is afebrile, not tachycardic or tachypneic, and not hypoxic. She is  hypertensive. CBC is remarkable for anemia which is at baseline. Lipase is normal. CMP remarkable for hyponatremia, mild hypokalemia and hypochloremia. BUN/SCr are at baseline. 500 cc IVF given. UA is clean.  CT of abdomen and pelvis is unremarkable for any acute changes when compared to last. Unlikely this is due to her enlarged lymph nodes as she has had this for a while and is unchanged from her last study in June. Possibly a viral illness. Zofran and morphine given.   Discussed with son about disclosing results about malignancy. Since I do not have access to biopsy report I advised son that the patient does have a right to know her results but I would not be able to give her full information. This information should be given to her by the ordering provider. Note sent via EPIC to Dr. Reynaldo Minium. Strict return precautions given.   Final Clinical Impressions(s) / ED Diagnoses   Final diagnoses:  Generalized abdominal pain  Nausea    New Prescriptions Discharge Medication List as of 07/18/2016  4:40 PM    START taking these medications   Details  traMADol (ULTRAM) 50 MG tablet Take 1 tablet (50 mg total) by mouth every 6 (six) hours as needed., Starting Tue 07/18/2016, Print         Recardo Evangelist, PA-C 07/19/16 [id number]    Elnora Morrison, MD 07/19/16 1214

## 2016-07-18 NOTE — ED Notes (Signed)
Kelly PA at bedside   

## 2016-07-18 NOTE — Patient Instructions (Signed)
     IF you received an x-ray today, you will receive an invoice from [facility]. Please contact [facility] at [phone] with questions or concerns regarding your invoice.   IF you received labwork today, you will receive an invoice from [facility]. Please contact [facility] at [phone] with questions or concerns regarding your invoice.   Our billing staff will not be able to assist you with questions regarding bills from these companies.  You will be contacted with the lab results as soon as they are available. The fastest way to get your results is to activate your My Chart account. Instructions are located on the last page of this paperwork. If you have not heard from us regarding the results in 2 weeks, please contact this office.      

## 2016-07-18 NOTE — ED Notes (Signed)
Pt began dry heaving, pt did this multiple times. This RN informed [employer].

## 2016-07-19 ENCOUNTER — Inpatient Hospital Stay (HOSPITAL_COMMUNITY)
Admission: EM | Admit: 2016-07-19 | Discharge: 2016-07-22 | DRG: 871 | Disposition: A | Discharge status: Nursing Home (Includes ICF) | Payer: Medicare Other | Attending: Internal Medicine | Admitting: Internal Medicine

## 2016-07-19 ENCOUNTER — Emergency Department (HOSPITAL_COMMUNITY): Payer: Medicare Other

## 2016-07-19 ENCOUNTER — Encounter (HOSPITAL_COMMUNITY): Payer: Self-pay | Admitting: *Deleted

## 2016-07-19 DIAGNOSIS — I428 Other cardiomyopathies: Secondary | ICD-10-CM | POA: Diagnosis present

## 2016-07-19 DIAGNOSIS — I10 Essential (primary) hypertension: Secondary | ICD-10-CM | POA: Diagnosis present

## 2016-07-19 DIAGNOSIS — J690 Pneumonitis due to inhalation of food and vomit: Secondary | ICD-10-CM | POA: Diagnosis present

## 2016-07-19 DIAGNOSIS — R509 Fever, unspecified: Secondary | ICD-10-CM

## 2016-07-19 DIAGNOSIS — I272 Other secondary pulmonary hypertension: Secondary | ICD-10-CM | POA: Diagnosis present

## 2016-07-19 DIAGNOSIS — Z9842 Cataract extraction status, left eye: Secondary | ICD-10-CM | POA: Diagnosis not present

## 2016-07-19 DIAGNOSIS — Z9841 Cataract extraction status, right eye: Secondary | ICD-10-CM | POA: Diagnosis not present

## 2016-07-19 DIAGNOSIS — Z880 Allergy status to penicillin: Secondary | ICD-10-CM | POA: Diagnosis not present

## 2016-07-19 DIAGNOSIS — E876 Hypokalemia: Secondary | ICD-10-CM | POA: Diagnosis present

## 2016-07-19 DIAGNOSIS — N183 Chronic kidney disease, stage 3 unspecified: Secondary | ICD-10-CM | POA: Diagnosis present

## 2016-07-19 DIAGNOSIS — A419 Sepsis, unspecified organism: Secondary | ICD-10-CM | POA: Diagnosis present

## 2016-07-19 DIAGNOSIS — N390 Urinary tract infection, site not specified: Secondary | ICD-10-CM | POA: Diagnosis present

## 2016-07-19 DIAGNOSIS — J449 Chronic obstructive pulmonary disease, unspecified: Secondary | ICD-10-CM | POA: Diagnosis present

## 2016-07-19 DIAGNOSIS — Z8249 Family history of ischemic heart disease and other diseases of the circulatory system: Secondary | ICD-10-CM

## 2016-07-19 DIAGNOSIS — Z9981 Dependence on supplemental oxygen: Secondary | ICD-10-CM

## 2016-07-19 DIAGNOSIS — G500 Trigeminal neuralgia: Secondary | ICD-10-CM

## 2016-07-19 DIAGNOSIS — I5042 Chronic combined systolic (congestive) and diastolic (congestive) heart failure: Secondary | ICD-10-CM | POA: Diagnosis present

## 2016-07-19 DIAGNOSIS — I482 Chronic atrial fibrillation, unspecified: Secondary | ICD-10-CM | POA: Diagnosis present

## 2016-07-19 DIAGNOSIS — Z66 Do not resuscitate: Secondary | ICD-10-CM | POA: Diagnosis present

## 2016-07-19 DIAGNOSIS — E785 Hyperlipidemia, unspecified: Secondary | ICD-10-CM | POA: Diagnosis present

## 2016-07-19 DIAGNOSIS — Z85828 Personal history of other malignant neoplasm of skin: Secondary | ICD-10-CM | POA: Diagnosis not present

## 2016-07-19 DIAGNOSIS — Z7901 Long term (current) use of anticoagulants: Secondary | ICD-10-CM | POA: Diagnosis not present

## 2016-07-19 DIAGNOSIS — I130 Hypertensive heart and chronic kidney disease with heart failure and stage 1 through stage 4 chronic kidney disease, or unspecified chronic kidney disease: Secondary | ICD-10-CM | POA: Diagnosis present

## 2016-07-19 DIAGNOSIS — Z961 Presence of intraocular lens: Secondary | ICD-10-CM | POA: Diagnosis present

## 2016-07-19 DIAGNOSIS — E871 Hypo-osmolality and hyponatremia: Secondary | ICD-10-CM | POA: Diagnosis present

## 2016-07-19 DIAGNOSIS — Z79899 Other long term (current) drug therapy: Secondary | ICD-10-CM

## 2016-07-19 DIAGNOSIS — D72829 Elevated white blood cell count, unspecified: Secondary | ICD-10-CM

## 2016-07-19 DIAGNOSIS — I5022 Chronic systolic (congestive) heart failure: Secondary | ICD-10-CM | POA: Diagnosis present

## 2016-07-19 LAB — CBC WITH DIFFERENTIAL/PLATELET
BASOS ABS: 0 10*3/uL (ref 0.0–0.1)
Basophils Relative: 0 %
EOS ABS: 0 10*3/uL (ref 0.0–0.7)
EOS PCT: 0 %
HCT: 29.4 % — ABNORMAL LOW (ref 36.0–46.0)
Hemoglobin: 9.9 g/dL — ABNORMAL LOW (ref 12.0–15.0)
LYMPHS PCT: 16 %
Lymphs Abs: 1.9 10*3/uL (ref 0.7–4.0)
MCH: 32.6 pg (ref 26.0–34.0)
MCHC: 33.7 g/dL (ref 30.0–36.0)
MCV: 96.7 fL (ref 78.0–100.0)
MONO ABS: 1.7 10*3/uL — AB (ref 0.1–1.0)
Monocytes Relative: 14 %
Neutro Abs: 8.3 10*3/uL — ABNORMAL HIGH (ref 1.7–7.7)
Neutrophils Relative %: 70 %
PLATELETS: 233 10*3/uL (ref 150–400)
RBC: 3.04 MIL/uL — ABNORMAL LOW (ref 3.87–5.11)
RDW: 14.9 % (ref 11.5–15.5)
WBC: 11.9 10*3/uL — ABNORMAL HIGH (ref 4.0–10.5)

## 2016-07-19 LAB — COMPREHENSIVE METABOLIC PANEL
ALBUMIN: 3.9 g/dL (ref 3.5–5.0)
ALK PHOS: 69 U/L (ref 38–126)
ALT: 14 U/L (ref 14–54)
AST: 27 U/L (ref 15–41)
Anion gap: 11 (ref 5–15)
BILIRUBIN TOTAL: 0.8 mg/dL (ref 0.3–1.2)
BUN: 25 mg/dL — AB (ref 6–20)
CALCIUM: 9.4 mg/dL (ref 8.9–10.3)
CO2: 30 mmol/L (ref 22–32)
CREATININE: 1.72 mg/dL — AB (ref 0.44–1.00)
Chloride: 90 mmol/L — ABNORMAL LOW (ref 101–111)
GFR calc Af Amer: 30 mL/min — ABNORMAL LOW (ref >60)
GFR, EST NON AFRICAN AMERICAN: 26 mL/min — AB (ref >60)
GLUCOSE: 108 mg/dL — AB (ref 65–99)
Potassium: 3.3 mmol/L — ABNORMAL LOW (ref 3.5–5.1)
Sodium: 131 mmol/L — ABNORMAL LOW (ref 135–145)
TOTAL PROTEIN: 7.7 g/dL (ref 6.5–8.1)

## 2016-07-19 LAB — LIPASE, BLOOD: LIPASE: 31 U/L (ref 11–51)

## 2016-07-19 LAB — URINALYSIS, ROUTINE W REFLEX MICROSCOPIC
Bilirubin Urine: NEGATIVE
Glucose, UA: NEGATIVE mg/dL
KETONES UR: NEGATIVE mg/dL
NITRITE: NEGATIVE
PH: 6.5 (ref 5.0–8.0)
Protein, ur: NEGATIVE mg/dL
Specific Gravity, Urine: 1.01 (ref 1.005–1.030)

## 2016-07-19 LAB — I-STAT CG4 LACTIC ACID, ED
Lactic Acid, Venous: 1.2 mmol/L (ref 0.5–1.9)
Lactic Acid, Venous: 1.09 mmol/L (ref 0.5–1.9)

## 2016-07-19 LAB — URINE MICROSCOPIC-ADD ON

## 2016-07-19 LAB — TROPONIN I: TROPONIN I: 0.03 ng/mL — AB (ref <0.03)

## 2016-07-19 MED ORDER — DEXTROSE 5 % IV SOLN
2.0000 g | INTRAVENOUS | Status: AC
  Administered 2016-07-19: 2 g via INTRAVENOUS
  Filled 2016-07-19: qty 2

## 2016-07-19 MED ORDER — SODIUM CHLORIDE 0.9 % IV BOLUS (SEPSIS)
1000.0000 mL | Freq: Once | INTRAVENOUS | Status: AC
  Administered 2016-07-19: 1000 mL via INTRAVENOUS

## 2016-07-19 MED ORDER — VANCOMYCIN HCL IN DEXTROSE 750-5 MG/150ML-% IV SOLN
750.0000 mg | INTRAVENOUS | Status: DC
  Administered 2016-07-20 – 2016-07-21 (×2): 750 mg via INTRAVENOUS
  Filled 2016-07-19 (×2): qty 150

## 2016-07-19 MED ORDER — LEVOFLOXACIN IN D5W 750 MG/150ML IV SOLN: 750.0000 mg | Freq: Once | INTRAVENOUS | Status: DC

## 2016-07-19 MED ORDER — DEXTROSE 5 % IV SOLN: 2.0000 g | Freq: Once | INTRAVENOUS | Status: DC

## 2016-07-19 MED ORDER — CEFEPIME HCL 2 G IJ SOLR
2.0000 g | INTRAMUSCULAR | Status: DC
  Administered 2016-07-20: 2 g via INTRAVENOUS
  Filled 2016-07-19: qty 2

## 2016-07-19 MED ORDER — VANCOMYCIN HCL IN DEXTROSE 1-5 GM/200ML-% IV SOLN
1000.0000 mg | Freq: Once | INTRAVENOUS | Status: AC
  Administered 2016-07-19: 1000 mg via INTRAVENOUS
  Filled 2016-07-19: qty 200

## 2016-07-19 NOTE — Progress Notes (Signed)
Pharmacy Antibiotic Note  Angela Franco is a 82 y.o. female admitted on 07/19/2016 with fever, increased WBC, and suspected sepsis.  PMH includes CKD-3, COPD, CHF, AFib on Eliquis, chronic anemia, trigeminal neuralgia, and a questionable diagnosis of lymphoma. Recent retroperitoneal lymph node biopsy (7/28) showed malignant cells.  She was seen yesterday at [facility] ED on 8/15 for abdominal pain (CT unremarkable for new acute issues, unclear etiology).  Pharmacy has been consulted for Cefepime and Vancomycin dosing.  Noted PCN allergy (hives) but patient tolerated several days of ceftazidime in 01/2016.  Plan:  Cefepime 2g IV stat, then 2g IV q24h  Vancomycin 1g IV stat, then 750 mg IV q24h  Measure Vanc trough at steady state  Follow up renal fxn, culture results, and clinical course   Height: 5\' 2"  (157.5 cm) Weight: 135 lb (61.2 kg) IBW/kg (Calculated) : 50.1  Temp (24hrs), Avg:99.7 F (37.6 C), Min:99.7 F (37.6 C), Max:99.7 F (37.6 C)   Recent Labs Lab 07/18/16 1355  WBC 5.8  CREATININE 1.59*    Estimated Creatinine Clearance: 22.7 mL/min (by C-G formula based on SCr of 1.59 mg/dL).    Allergies  Allergen Reactions  . Penicillins Hives    Has patient had a PCN reaction causing immediate rash, facial/tongue/throat swelling, SOB or lightheadedness with hypotension: No Has patient had a PCN reaction causing severe rash involving mucus membranes or skin necrosis: No Has patient had a PCN reaction that required hospitalization No Has patient had a PCN reaction occurring within the last 10 years: No If all of the above answers are "NO", then may proceed with Cephalosporin use.    Antimicrobials this admission: 8/16 Cefepime >>  8/16 Vancomycin >>   Dose adjustments this admission:  Microbiology results: 8/16 BCx: ordered 8/16 UCx: ordered   Thank you for allowing pharmacy to be a part of this patient's care.  Gretta Arab PharmD, BCPS Pager [phone] 07/19/2016  7:40 PM

## 2016-07-19 NOTE — ED Triage Notes (Signed)
Per EMS pt comes from PCP for fever, increase in WBC count since yesterday when patient was seen at [facility] ED.  Patient also has low O2 saturation at PCP and put on Oxygen via [place].  Patient uses O2 at home as needed.  Patient c/o fatigue today and had some nausea and dry heaves yesterday.  Patient given Acetaminophen 1000mg  at PCP office.   Vitals: 128/68, 98HR, 18RR, 96% 2L of O2, CBG 138.

## 2016-07-19 NOTE — H&P (Signed)
History and Physical    Angela Franco [id number] DOB: 1929-03-07 DOA: 07/19/2016  PCP: Geoffery Lyons, MD   Patient coming from: Assisted living facility  Chief Complaint: Referred from PCP's office for evaluation of fever to 103  HPI: Angela Franco is a 82 y.o. woman with a history of chronic atrial fibrillation (CHADS-Vasc score of 5, anticoagulated with Eliquis), HTN, COPD, diastolic heart failure, and CKD 3 who has had recent lymph node biopsy (results have NOT been disclosed to the patient).  She presented to the ED last night complaining of abdominal pain.  CT A/P without contrast did not show findings to explain her acute symptoms.  She was sent home and followed up with her PCP today as recommended.  There, she reported at least three episodes of vomiting last night.  No diarrhea.  Abdominal pain has actually resolved, but fever to 103 and mild hypoxia were noted at her PCP's office, which prompted referral to the ED.  Patient reports recent dry cough.  No sputum production.  She wears 2L [place] qHS at baseline; she typically does not need it during the day.  No chest pain or shortness of breath.  She has had low back pain for three weeks.  She denies any falls or trauma.  ED Course: CODE SEPSIS called based on fever, hypoxia (O2 sat mid 80's on RA here), and WBC of almost 12.  Chest xray interpreted as cardiomegaly with pulmonary edema but I think she could have evolving consolidation in the RLL.  The patient has received IV fluid resuscitation per protocol and IV cefepime and zosyn.  Blood and urine cultures ordered.  Lactic acid level 1.2.  Hospitalist asked to admit for further management.  Review of Systems: As per HPI otherwise 10 point review of systems negative.    Past Medical History:  Diagnosis Date  . Acute facial pain   . Altered mental status 08/24/2012  . Anemia, with significant drop in H/H 08/24/2012  . Arthritis    "fingers" (02/28/2016)  . Atrial fibrillation  ([place])   . Bursitis of hip   . CHF (congestive heart failure) ([place])   . Complication of anesthesia    "I'm slow to recover when I'm put to sleep"  . COPD (chronic obstructive pulmonary disease) ([place])    Never smoked. Questionable diagnosis.  [name] Dyslipidemia   . Heart disease   . Hypertension   . Hyponatremia   . Insomnia   . Kidney failure    Stage 3  . LBBB (left bundle branch block)   . Nonischemic cardiomyopathy ([place])   . On home oxygen therapy    "2L at night and prn during the day" (02/28/2016)  . Osteopenia   . Skin cancer of nose   . Trigeminal neuralgia   . UTI (lower urinary tract infection)     Past Surgical History:  Procedure Laterality Date  . APPENDECTOMY    . BREAST SURGERY Right    "took out some nodes"  . CARDIAC CATHETERIZATION  07/04/2012   normal coronaries, EF 30-35%, elevated R & L heart pressures, reduced CO, mild pulm HTN (Dr. K. Mali Hilty)  . CATARACT EXTRACTION W/ INTRAOCULAR LENS  IMPLANT, BILATERAL Bilateral   . DILATION AND CURETTAGE OF UTERUS    . LEFT AND RIGHT HEART CATHETERIZATION WITH CORONARY ANGIOGRAM N/A 07/04/2012   Procedure: LEFT AND RIGHT HEART CATHETERIZATION WITH CORONARY ANGIOGRAM;  Surgeon: Pixie Casino, MD;  Location: [facility] CATH LAB;  Service: Cardiovascular;  Laterality: N/A;  .  NM MYOCAR PERF WALL MOTION  2011   lexiscan myoview - normal LV systolic function w/normal wall motion, no evidence of scar/ischemia  . SKIN CANCER EXCISION     "nose"  . TONSILLECTOMY AND ADENOIDECTOMY    . TRANSTHORACIC ECHOCARDIOGRAM  2013   mild conc LVH, RV mildly dilated; LA severely dilated; RA mod dilated; mild-mod mitral annular calcif, calcif of anterior & posterior MV leaflet, mod MR; mod-severe TR with RSVP 30-59mmHg; mild calcif of AV leaflets and mod aortic regurg; mild pulm valve regurg; pericardial effusion (right)     reports that she has never smoked. She has never used smokeless tobacco. She reports that she does not drink alcohol or use  drugs.  She is a widow.  She has 3 adult children who share healthcare medical POA.  Allergies  Allergen Reactions  . Penicillins Hives    Has patient had a PCN reaction causing immediate rash, facial/tongue/throat swelling, SOB or lightheadedness with hypotension: No Has patient had a PCN reaction causing severe rash involving mucus membranes or skin necrosis: No Has patient had a PCN reaction that required hospitalization No Has patient had a PCN reaction occurring within the last 10 years: No If all of the above answers are "NO", then may proceed with Cephalosporin use.    Family History  Problem Relation Age of Onset  . Stroke Father   . Heart attack Father   . Alzheimer's disease Mother   . Heart attack Brother   . Heart attack Brother      Prior to Admission medications   Medication Sig Start Date End Date Taking? Authorizing Provider  acetaminophen-codeine (TYLENOL #3) 300-30 MG tablet Take 1-2 tablets by mouth every 6 (six) hours as needed for moderate pain. 05/13/16  Yes Domenic Moras, PA-C  allopurinol (ZYLOPRIM) 100 MG tablet Take 200 mg by mouth daily. 05/19/16  Yes Historical Provider, MD  apixaban (ELIQUIS) 2.5 MG TABS tablet Take 1 tablet (2.5 mg total) by mouth 2 (two) times daily. 11/16/15  Yes Pixie Casino, MD  calcium-vitamin D (OSCAL WITH D) 500-200 MG-UNIT tablet Take 1 tablet by mouth daily with breakfast.   Yes Historical Provider, MD  carvedilol (COREG) 6.25 MG tablet Take 1 tablet (6.25 mg total) by mouth 2 (two) times daily with a meal. 03/23/16  Yes Pixie Casino, MD  ferrous sulfate 325 (65 FE) MG tablet Take 325 mg by mouth daily with breakfast.   Yes Historical Provider, MD  furosemide (LASIX) 80 MG tablet Take 1 tablet (80 mg total) by mouth daily. 11/10/15 09/21/18 Yes Luke K Kilroy, PA-C  gabapentin (NEURONTIN) 600 MG tablet Take 1 tablet (600 mg total) by mouth 3 (three) times daily. Please dose at 8am, 2pm and 8pm 01/19/16  Yes Marcial Pacas, MD    ipratropium-albuterol (DUONEB) 0.5-2.5 (3) MG/3ML SOLN Take 3 mLs by nebulization every 6 (six) hours as needed. Patient taking differently: Take 3 mLs by nebulization daily.  01/04/16  Yes Liberty Handy, MD  Melatonin 10 MG TABS Take 10 mg by mouth at bedtime.    Yes Historical Provider, MD  ondansetron (ZOFRAN) 4 MG tablet Take 1 tablet (4 mg total) by mouth every 6 (six) hours. 07/18/16  Yes Recardo Evangelist, PA-C  OXcarbazepine (TRILEPTAL) 150 MG tablet Take 0.5 tablet twice daily. Patient taking differently: Take 75 mg by mouth 2 (two) times daily.  06/19/16  Yes Dennie Bible, NP  potassium chloride SA (K-DUR,KLOR-CON) 20 MEQ tablet Take 20 mEq by mouth  daily.   Yes Historical Provider, MD  simvastatin (ZOCOR) 10 MG tablet Take 0.5 tablets (5 mg total) by mouth daily at 6 PM. 11/24/15  Yes Pixie Casino, MD  traMADol (ULTRAM) 50 MG tablet Take 1 tablet (50 mg total) by mouth every 6 (six) hours as needed. Patient taking differently: Take 50 mg by mouth every 6 (six) hours as needed for moderate pain or severe pain.  07/18/16  Yes Recardo Evangelist, PA-C    Physical Exam: Vitals:   07/19/16 1937 07/19/16 2000 07/19/16 2102 07/19/16 2200  BP:  123/69 124/72 121/61  Pulse:  70 75 81  Resp:  25 26 26   Temp:   98 F (36.7 C)   TempSrc:   Oral   SpO2:  98% 99% 97%  Weight: 61.2 kg (135 lb)     Height: 5\' 2"  (1.575 m)         Constitutional: NAD, calm, comfortable Vitals:   07/19/16 1937 07/19/16 2000 07/19/16 2102 07/19/16 2200  BP:  123/69 124/72 121/61  Pulse:  70 75 81  Resp:  25 26 26   Temp:   98 F (36.7 C)   TempSrc:   Oral   SpO2:  98% 99% 97%  Weight: 61.2 kg (135 lb)     Height: 5\' 2"  (1.575 m)      Eyes: PERRL, lids and conjunctivae normal ENMT: Mucous membranes are moist. Posterior pharynx clear of any exudate or lesions. Normal dentition.  Neck: normal appearance, supple, no masses Respiratory: clear to auscultation bilaterally, no wheezing, no crackles.  Normal respiratory effort. No accessory muscle use.  Cardiovascular: Irregular but rate controlled.  No murmurs / rubs / gallops. No extremity edema. 2+ pedal pulses. GI: abdomen is soft and compressible.  No distention.  No tenderness.  No masses palpated.  Bowel sounds are present. Musculoskeletal:  No joint deformity in upper and lower extremities. Good ROM, no contractures. Normal muscle tone.  Skin: no rashes, warm and dry, chronic venous stasis changes in bilateral lower extremities Neurologic: CN 2-12 grossly intact. Sensation intact, Strength symmetric bilaterally, 5/5  Psychiatric: Normal judgment and insight. Alert and oriented x 3. Normal mood.     Labs on Admission: I have personally reviewed following labs and imaging studies  CBC:  Recent Labs Lab 07/18/16 1355 07/19/16 1957  WBC 5.8 11.9*  NEUTROABS  --  8.3*  HGB 10.5* 9.9*  HCT 33.1* 29.4*  MCV 97.6 96.7  PLT 241 0000000   Basic Metabolic Panel:  Recent Labs Lab 07/18/16 1355 07/19/16 1957  NA 130* 131*  K 3.4* 3.3*  CL 91* 90*  CO2 28 30  GLUCOSE 106* 108*  BUN 24* 25*  CREATININE 1.59* 1.72*  CALCIUM 9.8 9.4   GFR: Estimated Creatinine Clearance: 20.9 mL/min (by C-G formula based on SCr of 1.72 mg/dL). Liver Function Tests:  Recent Labs Lab 07/18/16 1355 07/19/16 1957  AST 31 27  ALT 18 14  ALKPHOS 73 69  BILITOT 0.6 0.8  PROT 8.1 7.7  ALBUMIN 3.7 3.9    Recent Labs Lab 07/18/16 1355 07/19/16 1957  LIPASE 36 31   Cardiac Enzymes:  Recent Labs Lab 07/19/16 1957  TROPONINI 0.03*   Urine analysis:    Component Value Date/Time   COLORURINE YELLOW 07/19/2016 1926   APPEARANCEUR CLEAR 07/19/2016 1926   LABSPEC 1.010 07/19/2016 1926   PHURINE 6.5 07/19/2016 1926   GLUCOSEU NEGATIVE 07/19/2016 1926   HGBUR TRACE (A) 07/19/2016 1926   BILIRUBINUR NEGATIVE  07/19/2016 1926   KETONESUR NEGATIVE 07/19/2016 1926   PROTEINUR NEGATIVE 07/19/2016 1926   UROBILINOGEN 0.2 08/23/2012 1449    NITRITE NEGATIVE 07/19/2016 1926   LEUKOCYTESUR TRACE (A) 07/19/2016 1926   Sepsis Labs:  Lactic acid level 1.2  Radiological Exams on Admission: Dg Chest 2 View  Result Date: 07/19/2016 CLINICAL DATA:  Fever with chest congestion. EXAM: CHEST  2 VIEW COMPARISON:  02/28/2016. FINDINGS: The cardio pericardial silhouette is enlarged. There is pulmonary vascular congestion without overt pulmonary edema. Likely underlying component of interstitial edema. No focal airspace consolidation. No substantial pleural effusion. Bones are diffusely demineralized. Degenerative changes noted right shoulder. Telemetry leads overlie the chest. IMPRESSION: Cardiomegaly with interstitial pulmonary edema. Electronically Signed   By: Misty Stanley M.D.   On: 07/19/2016 20:44    EKG: Independently reviewed. Rate controlled atrial fibrillation.  LBBB is not new.  Assessment/Plan Principal Problem:   Sepsis ([place]) Active Problems:   Atrial fibrillation, chronic (HCC)   Trigeminal neuralgia   HTN (hypertension)   Chronic renal impairment, stage 3 (moderate)   Chronic systolic heart failure (HCC)   Aspiration pneumonia (HCC)   Fever   Leukocytosis      Sepsis secondary to pneumonia, aspiration (recent nausea and vomiting) vs HCAP --Continue IV cefepime and vancomycin per protocol --Aggressive volume resuscitation ordered per protocol in the ED.  Will not continue maintenance fluids for now since she has a history of CHF and she is not hypotensive.  Monitor volume status. --Blood and urine cultures pending --Urine streptococcal antigen per Pneumonia admission orderset --Repeat lactic acid level and procalcitonin levels pending --Will check coags now since she is on chronic anticoagulation --Repeat CBC in the AM to follow WBC trend --She will need repeat imaging of her chest in 24-48 hours  COPD --Supplemental O2 as needed --Duonebs q6h prn  Chronic atrial fibrillation --Continue Eliquis,  coreg  CHF --Hold lasix for at least the first 24 hours.  Monitor volume status.  CKD 3 --Appears stable  Appears to have a chronic anemia (likely multifactorial, on iron supplement at home) --On Eliquis, recent lymph node biopsy, monitor for transfusion requirement  Hypokalemia --Replacement ordered, repeat BMP in the AM     DVT prophylaxis: Full anticoagulation with Eliquis Code Status: DNR/DNI Family Communication: Daughter [name] [place]t at bedside in the ED at time of admission.  She would like to be called tomorrow if she is not present for rounds [phone] Disposition Plan: To be determined Consults called: NONE Admission status: Inpatient, telemetry   TIME SPENT: 65 minutes   Eber Jones MD [facility] Pager [phone]  If 7PM-7AM, please contact night-coverage [email or web address] Password [email or web address]  07/19/2016, 11:20 PM

## 2016-07-19 NOTE — ED Provider Notes (Signed)
[facility] DEPT Provider Note   CSN: [id number] Arrival date & time: 07/19/16  1747     History   Chief Complaint Chief Complaint  Patient presents with  . Fever  . elevation in WBC  . low oxygen saturation    HPI Angela Franco is a 82 y.o. female.  HPI Patient was seen in the emergency department yesterday for severe lower abdominal pain. She followed up in her primary care physician's office today and was noted to have a fever of 103 with hypoxia and doubling of her leukocyte count since yesterday. She is referred to the emergency department for admission and treatment for possible sepsis. The patient reports that her abdominal pain has actually resolved. She did have 3 episodes of vomiting yesterday. Her appetite has somewhat returned today. She however has felt very fatigued and weak. She has had a small amount of coughing. No chest pain. Past Medical History:  Diagnosis Date  . Acute facial pain   . Altered mental status 08/24/2012  . Anemia, with significant drop in H/H 08/24/2012  . Arthritis    "fingers" (02/28/2016)  . Atrial fibrillation ([place])   . Bursitis of hip   . CHF (congestive heart failure) ([place])   . Complication of anesthesia    "I'm slow to recover when I'm put to sleep"  . COPD (chronic obstructive pulmonary disease) ([place])    Never smoked. Questionable diagnosis.  [name] Dyslipidemia   . Heart disease   . Hypertension   . Hyponatremia   . Insomnia   . Kidney failure    Stage 3  . LBBB (left bundle branch block)   . Nonischemic cardiomyopathy ([place])   . On home oxygen therapy    "2L at night and prn during the day" (02/28/2016)  . Osteopenia   . Skin cancer of nose   . Trigeminal neuralgia   . UTI (lower urinary tract infection)     Patient Active Problem List   Diagnosis Date Noted  . Sepsis ([place]) 07/19/2016  . Aspiration pneumonia ([place]) 07/19/2016  . Fever 07/19/2016  . Leukocytosis 07/19/2016  . Acute respiratory failure with hypoxia ([place])  02/28/2016  . Acute renal failure superimposed on stage 3 chronic kidney disease ([place]) 02/28/2016  . Acute on chronic diastolic heart failure ([place])   . Acute on chronic systolic heart failure ([place])   . Respiratory syncytial virus (RSV) infection   . Chronic systolic heart failure ([place])   . COPD exacerbation ([place]) 01/21/2016  . Chronic renal impairment, stage 3 (moderate) 11/10/2015  . COPD by CXR 08/26/2012  . Non-ischemic cardiomyopathy EF improved from 30-35% to 40-45% by Echo in 07/2012 06/30/2012  . LBBB (left bundle branch block) 06/30/2012  . Chronic anticoagulation 06/30/2012  . HTN (hypertension) 06/30/2012  . Systolic and diastolic CHF, acute on chronic,  06/29/2012  . Hypo-osmolality and hyponatremia 06/29/2012  . Normocytic anemia 06/29/2012  . Atrial fibrillation, chronic ([place]) 06/29/2012  . Trigeminal neuralgia 06/29/2012  . Moderate to severe pulmonary hypertension, PA pressure in 70s June 2013 06/29/2012    Past Surgical History:  Procedure Laterality Date  . APPENDECTOMY    . BREAST SURGERY Right    "took out some nodes"  . CARDIAC CATHETERIZATION  07/04/2012   normal coronaries, EF 30-35%, elevated R & L heart pressures, reduced CO, mild pulm HTN (Dr. K. Mali Hilty)  . CATARACT EXTRACTION W/ INTRAOCULAR LENS  IMPLANT, BILATERAL Bilateral   . DILATION AND CURETTAGE OF UTERUS    . LEFT AND  RIGHT HEART CATHETERIZATION WITH CORONARY ANGIOGRAM N/A 07/04/2012   Procedure: LEFT AND RIGHT HEART CATHETERIZATION WITH CORONARY ANGIOGRAM;  Surgeon: Pixie Casino, MD;  Location: [facility] CATH LAB;  Service: Cardiovascular;  Laterality: N/A;  . NM MYOCAR PERF WALL MOTION  2011   lexiscan myoview - normal LV systolic function w/normal wall motion, no evidence of scar/ischemia  . SKIN CANCER EXCISION     "nose"  . TONSILLECTOMY AND ADENOIDECTOMY    . TRANSTHORACIC ECHOCARDIOGRAM  2013   mild conc LVH, RV mildly dilated; LA severely dilated; RA mod dilated; mild-mod mitral annular  calcif, calcif of anterior & posterior MV leaflet, mod MR; mod-severe TR with RSVP 30-61mmHg; mild calcif of AV leaflets and mod aortic regurg; mild pulm valve regurg; pericardial effusion (right)    OB History    No data available       Home Medications    Prior to Admission medications   Medication Sig Start Date End Date Taking? Authorizing Provider  acetaminophen-codeine (TYLENOL #3) 300-30 MG tablet Take 1-2 tablets by mouth every 6 (six) hours as needed for moderate pain. 05/13/16  Yes Domenic Moras, PA-C  allopurinol (ZYLOPRIM) 100 MG tablet Take 200 mg by mouth daily. 05/19/16  Yes Historical Provider, MD  apixaban (ELIQUIS) 2.5 MG TABS tablet Take 1 tablet (2.5 mg total) by mouth 2 (two) times daily. 11/16/15  Yes Pixie Casino, MD  calcium-vitamin D (OSCAL WITH D) 500-200 MG-UNIT tablet Take 1 tablet by mouth daily with breakfast.   Yes Historical Provider, MD  carvedilol (COREG) 6.25 MG tablet Take 1 tablet (6.25 mg total) by mouth 2 (two) times daily with a meal. 03/23/16  Yes Pixie Casino, MD  ferrous sulfate 325 (65 FE) MG tablet Take 325 mg by mouth daily with breakfast.   Yes Historical Provider, MD  furosemide (LASIX) 80 MG tablet Take 1 tablet (80 mg total) by mouth daily. 11/10/15 09/21/18 Yes Luke K Kilroy, PA-C  gabapentin (NEURONTIN) 600 MG tablet Take 1 tablet (600 mg total) by mouth 3 (three) times daily. Please dose at 8am, 2pm and 8pm 01/19/16  Yes Marcial Pacas, MD  ipratropium-albuterol (DUONEB) 0.5-2.5 (3) MG/3ML SOLN Take 3 mLs by nebulization every 6 (six) hours as needed. Patient taking differently: Take 3 mLs by nebulization daily.  01/04/16  Yes Liberty Handy, MD  Melatonin 10 MG TABS Take 10 mg by mouth at bedtime.    Yes Historical Provider, MD  ondansetron (ZOFRAN) 4 MG tablet Take 1 tablet (4 mg total) by mouth every 6 (six) hours. 07/18/16  Yes Recardo Evangelist, PA-C  OXcarbazepine (TRILEPTAL) 150 MG tablet Take 0.5 tablet twice daily. Patient taking  differently: Take 75 mg by mouth 2 (two) times daily.  06/19/16  Yes Dennie Bible, NP  potassium chloride SA (K-DUR,KLOR-CON) 20 MEQ tablet Take 20 mEq by mouth daily.   Yes Historical Provider, MD  simvastatin (ZOCOR) 10 MG tablet Take 0.5 tablets (5 mg total) by mouth daily at 6 PM. 11/24/15  Yes Pixie Casino, MD  traMADol (ULTRAM) 50 MG tablet Take 1 tablet (50 mg total) by mouth every 6 (six) hours as needed. Patient taking differently: Take 50 mg by mouth every 6 (six) hours as needed for moderate pain or severe pain.  07/18/16  Yes Recardo Evangelist, PA-C    Family History Family History  Problem Relation Age of Onset  . Stroke Father   . Heart attack Father   . Alzheimer's disease Mother   .  Heart attack Brother   . Heart attack Brother     Social History Social History  Substance Use Topics  . Smoking status: Never Smoker  . Smokeless tobacco: Never Used  . Alcohol use No     Allergies   Penicillins   Review of Systems Review of Systems  10 Systems reviewed and are negative for acute change except as noted in the HPI.  Physical Exam Updated Vital Signs BP 121/61   Pulse 81   Temp 98 F (36.7 C) (Oral)   Resp 26   Ht 5\' 2"  (1.575 m)   Wt 135 lb (61.2 kg)   SpO2 97%   BMI 24.69 kg/m   Physical Exam  Constitutional: She is oriented to person, place, and time. She appears well-developed and well-nourished. No distress.  HENT:  Head: Normocephalic and atraumatic.  Right Ear: External ear normal.  Left Ear: External ear normal.  Mouth/Throat: Oropharynx is clear and moist.  Eyes: Conjunctivae and EOM are normal.  Neck: Neck supple.  Cardiovascular: Normal rate and regular rhythm.   No murmur heard. Pulmonary/Chest: Effort normal. No respiratory distress.  Slight rail on the left lung field. Good air flow no gross wheeze.  Abdominal: Soft. She exhibits no distension. There is no tenderness. There is no guarding.  Musculoskeletal: Normal range of  motion. She exhibits no edema, tenderness or deformity.  Neurological: She is alert and oriented to person, place, and time. No cranial nerve deficit. She exhibits normal muscle tone. Coordination normal.  Skin: Skin is warm and dry. No rash noted.  Psychiatric: She has a normal mood and affect.  Nursing note and vitals reviewed.    ED Treatments / Results  Labs (all labs ordered are listed, but only abnormal results are displayed) Labs Reviewed  COMPREHENSIVE METABOLIC PANEL - Abnormal; Notable for the following:       Result Value   Sodium 131 (*)    Potassium 3.3 (*)    Chloride 90 (*)    Glucose, Bld 108 (*)    BUN 25 (*)    Creatinine, Ser 1.72 (*)    GFR calc non Af Amer 26 (*)    GFR calc Af Amer 30 (*)    All other components within normal limits  CBC WITH DIFFERENTIAL/PLATELET - Abnormal; Notable for the following:    WBC 11.9 (*)    RBC 3.04 (*)    Hemoglobin 9.9 (*)    HCT 29.4 (*)    Neutro Abs 8.3 (*)    Monocytes Absolute 1.7 (*)    All other components within normal limits  URINALYSIS, ROUTINE W REFLEX MICROSCOPIC (NOT AT [facility]) - Abnormal; Notable for the following:    Hgb urine dipstick TRACE (*)    Leukocytes, UA TRACE (*)    All other components within normal limits  TROPONIN I - Abnormal; Notable for the following:    Troponin I 0.03 (*)    All other components within normal limits  URINE MICROSCOPIC-ADD ON - Abnormal; Notable for the following:    Squamous Epithelial / LPF 0-5 (*)    Bacteria, UA FEW (*)    All other components within normal limits  CULTURE, BLOOD (ROUTINE X 2)  CULTURE, BLOOD (ROUTINE X 2)  URINE CULTURE  LIPASE, BLOOD  I-STAT CG4 LACTIC ACID, ED  I-STAT CG4 LACTIC ACID, ED    EKG  EKG Interpretation  Date/Time:  Wednesday July 19 2016 19:34:09 EDT Ventricular Rate:  72 PR Interval:  QRS Duration: 140 QT Interval:  456 QTC Calculation: 500 R Axis:   -53 Text Interpretation:  Atrial fibrillation Left bundle branch  block old left bundle. prior atrial fibrillation. dynamic lateral T wave. Confirmed by Johnney Killian, MD, Jeannie Done [id number]) on 07/19/2016 7:59:19 PM       Radiology Ct Abdomen Pelvis Wo Contrast  Result Date: 07/18/2016 CLINICAL DATA:  Abdominal pain. Recent retroperitoneal lymph node biopsy 06/30/2016 revealing adenocarcinoma EXAM: CT ABDOMEN AND PELVIS WITHOUT CONTRAST TECHNIQUE: Multidetector CT imaging of the abdomen and pelvis was performed following the standard protocol without IV contrast. COMPARISON:  CT abdomen 05/05/2016 FINDINGS: Lower chest: Marked cardiac enlargement. Marked right atrial enlargement. No pericardial effusion. Hepatobiliary: Unenhanced images of the liver demonstrate no liver mass. Normal liver size. Gallbladder and bile ducts normal. Pancreas: Negative Spleen: Negative Adrenals/Urinary Tract: No renal obstruction. No mass or stone. Urinary bladder well distended without focal mass. Stomach/Bowel: Stomach and duodenum normal. Negative for bowel obstruction. No bowel mass or edema. Vascular/Lymphatic: Atherosclerotic calcification in the aorta without aneurysm. Periaortic lymphadenopathy appears unchanged. Left periaortic lymph node 22 x 23 mm. Left lower periaortic lymph node 20 x 29 mm. Left hypogastric lymph node 17 mm. Lymph node anterior to the aorta below the SMA measures 13 x 25 mm. Reproductive: Normal uterus.  No pelvic mass. Other: No free fluid or free air.  Negative for hernia. Musculoskeletal: Moderate lumbar disc and facet degeneration. Negative for fracture or metastatic disease. No acute skeletal abnormality. IMPRESSION: Periaortic adenopathy similar to 05/05/2016. Recent biopsy revealed adenocarcinoma. Primary lesion not identified. No superimposed acute abnormality to explain the patient's acute abdominal pain Marked cardiac enlargement. Electronically Signed   By: Franchot Gallo M.D.   On: 07/18/2016 16:09   Dg Chest 2 View  Result Date: 07/19/2016 CLINICAL DATA:   Fever with chest congestion. EXAM: CHEST  2 VIEW COMPARISON:  02/28/2016. FINDINGS: The cardio pericardial silhouette is enlarged. There is pulmonary vascular congestion without overt pulmonary edema. Likely underlying component of interstitial edema. No focal airspace consolidation. No substantial pleural effusion. Bones are diffusely demineralized. Degenerative changes noted right shoulder. Telemetry leads overlie the chest. IMPRESSION: Cardiomegaly with interstitial pulmonary edema. Electronically Signed   By: Misty Stanley M.D.   On: 07/19/2016 20:44    Procedures Procedures (including critical care time) CRITICAL CARE Performed by: Charlesetta Shanks   Total critical care time: 30 minutes  Critical care time was exclusive of separately billable procedures and treating other patients.  Critical care was necessary to treat or prevent imminent or life-threatening deterioration.  Critical care was time spent personally by me on the following activities: development of treatment plan with patient and/or surrogate as well as nursing, discussions with consultants, evaluation of patient's response to treatment, examination of patient, obtaining history from patient or surrogate, ordering and performing treatments and interventions, ordering and review of laboratory studies, ordering and review of radiographic studies, pulse oximetry and re-evaluation of patient's condition.  Medications Ordered in ED Medications  ceFEPIme (MAXIPIME) 2 g in dextrose 5 % 50 mL IVPB (not administered)  vancomycin (VANCOCIN) IVPB 750 mg/150 ml premix (not administered)  sodium chloride 0.9 % bolus 1,000 mL (0 mLs Intravenous Stopped 07/19/16 2041)    And  sodium chloride 0.9 % bolus 1,000 mL (0 mLs Intravenous Stopped 07/19/16 2214)  vancomycin (VANCOCIN) IVPB 1000 mg/200 mL premix (0 mg Intravenous Stopped 07/19/16 2214)  ceFEPIme (MAXIPIME) 2 g in dextrose 5 % 50 mL IVPB (0 g Intravenous Stopped 07/19/16 2041)  Initial Impression / Assessment and Plan / ED Course  I have reviewed the triage vital signs and the nursing notes.  Pertinent labs & imaging results that were available during my care of the patient were reviewed by me and considered in my medical decision making (see chart for details).  Clinical Course   Consult: Dr. Cindie Crumbly [facility] hospitalist for admission.  Final Clinical Impressions(s) / ED Diagnoses   Final diagnoses:  Sepsis, due to unspecified organism [facility])  Patient was at outpatient providers office where she was found to have fever of 103 in conjunction with hypoxia. Patient had high suspicion for sepsis with leukocytosis double compared with yesterday. Possible foci are UTI and pneumonia. Broad coverage antibiotics initiated as well as fluid hydration. Patient is responded very well to this and has remained afebrile. She did have Tylenol prior to arrival at her primary care providers office.  New Prescriptions New Prescriptions   No medications on file     Charlesetta Shanks, MD 07/20/16 0002

## 2016-07-20 DIAGNOSIS — I482 Chronic atrial fibrillation: Secondary | ICD-10-CM

## 2016-07-20 DIAGNOSIS — A419 Sepsis, unspecified organism: Principal | ICD-10-CM

## 2016-07-20 DIAGNOSIS — J690 Pneumonitis due to inhalation of food and vomit: Secondary | ICD-10-CM

## 2016-07-20 DIAGNOSIS — N183 Chronic kidney disease, stage 3 (moderate): Secondary | ICD-10-CM

## 2016-07-20 LAB — BASIC METABOLIC PANEL
ANION GAP: 11 (ref 5–15)
BUN: 25 mg/dL — ABNORMAL HIGH (ref 6–20)
CALCIUM: 8.5 mg/dL — AB (ref 8.9–10.3)
CO2: 27 mmol/L (ref 22–32)
Chloride: 95 mmol/L — ABNORMAL LOW (ref 101–111)
Creatinine, Ser: 1.32 mg/dL — ABNORMAL HIGH (ref 0.44–1.00)
GFR calc Af Amer: 42 mL/min — ABNORMAL LOW (ref >60)
GFR calc non Af Amer: 36 mL/min — ABNORMAL LOW (ref >60)
GLUCOSE: 104 mg/dL — AB (ref 65–99)
Potassium: 3.1 mmol/L — ABNORMAL LOW (ref 3.5–5.1)
Sodium: 133 mmol/L — ABNORMAL LOW (ref 135–145)

## 2016-07-20 LAB — CBC
HEMATOCRIT: 29.2 % — AB (ref 36.0–46.0)
Hemoglobin: 9.7 g/dL — ABNORMAL LOW (ref 12.0–15.0)
MCH: 31.9 pg (ref 26.0–34.0)
MCHC: 33.2 g/dL (ref 30.0–36.0)
MCV: 96.1 fL (ref 78.0–100.0)
Platelets: 175 10*3/uL (ref 150–400)
RBC: 3.04 MIL/uL — ABNORMAL LOW (ref 3.87–5.11)
RDW: 15 % (ref 11.5–15.5)
WBC: 10.9 10*3/uL — AB (ref 4.0–10.5)

## 2016-07-20 LAB — APTT: aPTT: 39 seconds — ABNORMAL HIGH (ref 24–36)

## 2016-07-20 LAB — PROTIME-INR
INR: 1.24
Prothrombin Time: 15.7 seconds — ABNORMAL HIGH (ref 11.4–15.2)

## 2016-07-20 LAB — LACTIC ACID, PLASMA: LACTIC ACID, VENOUS: 0.8 mmol/L (ref 0.5–1.9)

## 2016-07-20 LAB — PROCALCITONIN: PROCALCITONIN: 0.15 ng/mL

## 2016-07-20 LAB — STREP PNEUMONIAE URINARY ANTIGEN: STREP PNEUMO URINARY ANTIGEN: NEGATIVE

## 2016-07-20 MED ORDER — SIMVASTATIN 10 MG PO TABS
5.0000 mg | ORAL_TABLET | Freq: Every day | ORAL | Status: DC
  Administered 2016-07-20 – 2016-07-21 (×2): 5 mg via ORAL
  Filled 2016-07-20 (×2): qty 1

## 2016-07-20 MED ORDER — ACETAMINOPHEN-CODEINE #3 300-30 MG PO TABS: 1.0000 | ORAL_TABLET | Freq: Four times a day (QID) | ORAL | Status: DC | PRN

## 2016-07-20 MED ORDER — OXCARBAZEPINE 150 MG PO TABS
75.0000 mg | ORAL_TABLET | Freq: Two times a day (BID) | ORAL | Status: DC
  Administered 2016-07-20 – 2016-07-22 (×5): 75 mg via ORAL
  Filled 2016-07-20 (×5): qty 0.5

## 2016-07-20 MED ORDER — POTASSIUM CHLORIDE CRYS ER 20 MEQ PO TBCR
40.0000 meq | EXTENDED_RELEASE_TABLET | Freq: Once | ORAL | Status: AC
  Administered 2016-07-20: 40 meq via ORAL
  Filled 2016-07-20: qty 2

## 2016-07-20 MED ORDER — POLYETHYLENE GLYCOL 3350 17 G PO PACK
17.0000 g | PACK | Freq: Every day | ORAL | Status: DC
  Administered 2016-07-20 – 2016-07-21 (×2): 17 g via ORAL
  Filled 2016-07-20 (×3): qty 1

## 2016-07-20 MED ORDER — GABAPENTIN 300 MG PO CAPS
600.0000 mg | ORAL_CAPSULE | ORAL | Status: DC
  Administered 2016-07-20 – 2016-07-22 (×8): 600 mg via ORAL
  Filled 2016-07-20 (×8): qty 2

## 2016-07-20 MED ORDER — ACETAMINOPHEN 325 MG PO TABS
650.0000 mg | ORAL_TABLET | Freq: Four times a day (QID) | ORAL | Status: DC | PRN
  Administered 2016-07-20: 650 mg via ORAL
  Filled 2016-07-20: qty 2

## 2016-07-20 MED ORDER — IPRATROPIUM-ALBUTEROL 0.5-2.5 (3) MG/3ML IN SOLN: 3.0000 mL | Freq: Four times a day (QID) | RESPIRATORY_TRACT | Status: DC | PRN

## 2016-07-20 MED ORDER — MELATONIN 10 MG PO TABS
10.0000 mg | ORAL_TABLET | Freq: Every day | ORAL | Status: DC
Start: 2016-07-20 — End: 2016-07-20

## 2016-07-20 MED ORDER — ALLOPURINOL 100 MG PO TABS
200.0000 mg | ORAL_TABLET | Freq: Every day | ORAL | Status: DC
  Administered 2016-07-20 – 2016-07-22 (×3): 200 mg via ORAL
  Filled 2016-07-20 (×3): qty 2

## 2016-07-20 MED ORDER — POTASSIUM CHLORIDE CRYS ER 20 MEQ PO TBCR
20.0000 meq | EXTENDED_RELEASE_TABLET | Freq: Once | ORAL | Status: AC
  Administered 2016-07-20: 20 meq via ORAL

## 2016-07-20 MED ORDER — BISACODYL 10 MG RE SUPP: 10.0000 mg | Freq: Once | RECTAL | Status: DC

## 2016-07-20 MED ORDER — FUROSEMIDE 40 MG PO TABS
80.0000 mg | ORAL_TABLET | Freq: Every day | ORAL | Status: DC
  Administered 2016-07-20 – 2016-07-22 (×3): 80 mg via ORAL
  Filled 2016-07-20 (×3): qty 2

## 2016-07-20 MED ORDER — CARVEDILOL 6.25 MG PO TABS
6.2500 mg | ORAL_TABLET | Freq: Two times a day (BID) | ORAL | Status: DC
  Administered 2016-07-20 – 2016-07-22 (×5): 6.25 mg via ORAL
  Filled 2016-07-20 (×5): qty 1

## 2016-07-20 MED ORDER — ENSURE ENLIVE PO LIQD
237.0000 mL | Freq: Two times a day (BID) | ORAL | Status: DC
  Administered 2016-07-20 – 2016-07-21 (×3): 237 mL via ORAL

## 2016-07-20 MED ORDER — POTASSIUM CHLORIDE CRYS ER 20 MEQ PO TBCR
20.0000 meq | EXTENDED_RELEASE_TABLET | Freq: Every day | ORAL | Status: DC
  Administered 2016-07-20 – 2016-07-22 (×3): 20 meq via ORAL
  Filled 2016-07-20 (×3): qty 1

## 2016-07-20 MED ORDER — APIXABAN 2.5 MG PO TABS
2.5000 mg | ORAL_TABLET | Freq: Two times a day (BID) | ORAL | Status: DC
  Administered 2016-07-20 – 2016-07-22 (×6): 2.5 mg via ORAL
  Filled 2016-07-20 (×6): qty 1

## 2016-07-20 MED ORDER — TRAMADOL HCL 50 MG PO TABS: 50.0000 mg | ORAL_TABLET | Freq: Four times a day (QID) | ORAL | Status: DC | PRN

## 2016-07-20 MED ORDER — ONDANSETRON HCL 4 MG/2ML IJ SOLN: 4.0000 mg | Freq: Four times a day (QID) | INTRAMUSCULAR | Status: DC | PRN

## 2016-07-20 NOTE — Clinical Social Work Note (Addendum)
Clinical Social Work Assessment  Patient Details  Name: Angela Franco MRN: [id number] Date of Birth: [birth year]  Date of referral:  07/20/16               Reason for consult:  Facility Placement                Permission sought to share information with:  Facility Sport and exercise psychologist, Family Supports Permission granted to share information::     Name::        Agency::     Relationship::  Daughter: Angela Franco  Contact Information:   [phone]  Housing/Transportation Living arrangements for the past 2 months:  [place] of Information:  Adult Children Patient Interpreter Needed:  None Criminal Activity/Legal Involvement Pertinent to Current Situation/Hospitalization:  No - Comment as needed Significant Relationships:  Adult Children Lives with:  Self, Facility Resident Do you feel safe going back to the place where you live?  Yes Need for family participation in patient care:  Yes (Comment)  Care giving concerns:  Patient daughter reports the patient has been living in [place] independent living four years, until this week. Patient lives in [facility] where she will be able to get more support. Patient daughter reports she is worried about her mother's decline in health. She inquired about ways to care for her mother and be supportive.  Social Worker assessment / plan:   LCSWA met with patient daughter, as patient was sleeping during assessment. Patient daughter is concerned about patient decline in health. LCSWA offered emotional support. LCSWA will assist with patient disposition to ALF.  LCSWA spoke with social worker Truman Hayward Eanes:[phone] Ext. [phone] about patient care coordination and requested information upon return.   Employment status:  Retired Forensic scientist:  Medicare PT Recommendations:  Not assessed at this time [place] / Referral to community resources:  [place]  Patient/Family's Response to  care: Agreeable. Patient/Family's Understanding of and Emotional Response to Diagnosis, Current Treatment, and Prognosis:  "She will return to ALF, and I will need to be more supportive." Patient is agreeable to care and treatment at this time. The patient will continue treatment at ALF.   Emotional Assessment Appearance:  Appears stated age Attitude/Demeanor/Rapport:    Affect (typically observed):  Quiet, Calm Orientation:  Oriented to Self Alcohol / Substance use:  Not Applicable Psych involvement (Current and /or in the community):  No (Comment)  Discharge Needs  Concerns to be addressed:  Care Coordination Readmission within the last 30 days:    Current discharge risk:  None Barriers to Discharge:  No Barriers Identified   Lia Hopping, LCSW 07/20/2016, 12:42 PM

## 2016-07-20 NOTE — NC FL2 (Addendum)
[place] MEDICAID FL2 LEVEL OF CARE SCREENING TOOL     IDENTIFICATION  Patient Name: Angela Franco Birthdate: 08-11-1929 Sex: female Admission Date (Current Location): 07/19/2016  [facility] and [place] Number:  Herbalist and Address:  [facility],  [place] [address], [place]      Provider Number: [phone]  Attending Physician Name and Address:  Elmarie Shiley, MD  Relative Name and Phone Number:       Current Level of Care: Hospital Recommended Level of Care: [place] Prior Approval Number:    Date Approved/Denied:   PASRR Number:    Discharge Plan: Other (Comment) (ALF)    Current Diagnoses: Patient Active Problem List   Diagnosis Date Noted  . Sepsis ([place]) 07/19/2016  . Aspiration pneumonia ([place]) 07/19/2016  . Fever 07/19/2016  . Leukocytosis 07/19/2016  . Acute respiratory failure with hypoxia ([place]) 02/28/2016  . Acute renal failure superimposed on stage 3 chronic kidney disease ([place]) 02/28/2016  . Acute on chronic diastolic heart failure ([place])   . Acute on chronic systolic heart failure ([place])   . Respiratory syncytial virus (RSV) infection   . Chronic systolic heart failure ([place])   . COPD exacerbation ([place]) 01/21/2016  . Chronic renal impairment, stage 3 (moderate) 11/10/2015  . COPD by CXR 08/26/2012  . Non-ischemic cardiomyopathy EF improved from 30-35% to 40-45% by Echo in 07/2012 06/30/2012  . LBBB (left bundle branch block) 06/30/2012  . Chronic anticoagulation 06/30/2012  . HTN (hypertension) 06/30/2012  . Systolic and diastolic CHF, acute on chronic,  06/29/2012  . Hypo-osmolality and hyponatremia 06/29/2012  . Normocytic anemia 06/29/2012  . Atrial fibrillation, chronic ([place]) 06/29/2012  . Trigeminal neuralgia 06/29/2012  . Moderate to severe pulmonary hypertension, PA pressure in 70s June 2013 06/29/2012    Orientation RESPIRATION BLADDER Height & Weight     Self, Time, Situation, Place  Continent Weight: 135 lb (61.2 kg) Height:  5\' 2"  (157.5 cm)  BEHAVIORAL SYMPTOMS/MOOD NEUROLOGICAL BOWEL NUTRITION STATUS      Continent Diet  AMBULATORY STATUS COMMUNICATION OF NEEDS Skin     Verbally Normal                       Personal Care Assistance Level of Assistance  Bathing, Feeding, Dressing Bathing Assistance: Limited assistance Feeding assistance: Independent Dressing Assistance: Limited assistance     Functional Limitations Info  Sight, Hearing, Speech Sight Info: Adequate Hearing Info: Adequate Speech Info: Adequate    SPECIAL CARE FACTORS FREQUENCY  PT (By licensed PT), OT (By licensed OT)                    Contractures Contractures Info: Not present    Additional Factors Info  Code Status, Allergies, Psychotropic               Current Medications (07/20/2016):  This is the current hospital active medication list Current Facility-Administered Medications  Medication Dose Route Frequency Provider Last Rate Last Dose  . acetaminophen-codeine (TYLENOL #3) 300-30 MG per tablet 1-2 tablet  1-2 tablet Oral Q6H PRN Lily Kocher, MD      . allopurinol (ZYLOPRIM) tablet 200 mg  200 mg Oral Daily Lily Kocher, MD   200 mg at 07/20/16 0842  . apixaban (ELIQUIS) tablet 2.5 mg  2.5 mg Oral BID Lily Kocher, MD   2.5 mg at 07/20/16 0841  . bisacodyl (DULCOLAX) suppository 10 mg  10 mg Rectal Once Belkys A  Regalado, MD      . carvedilol (COREG) tablet 6.25 mg  6.25 mg Oral BID WC Lily Kocher, MD   6.25 mg at 07/20/16 0841  . ceFEPIme (MAXIPIME) 2 g in dextrose 5 % 50 mL IVPB  2 g Intravenous Q24H Christine E Shade, RPH      . feeding supplement (ENSURE ENLIVE) (ENSURE ENLIVE) liquid 237 mL  237 mL Oral BID BM Belkys A Regalado, MD   237 mL at 07/20/16 1240  . furosemide (LASIX) tablet 80 mg  80 mg Oral Daily Belkys A Regalado, MD   80 mg at 07/20/16 1052  . gabapentin (NEURONTIN) capsule 600 mg  600 mg Oral 3 times per day Lily Kocher, MD   600 mg at  07/20/16 0841  . ipratropium-albuterol (DUONEB) 0.5-2.5 (3) MG/3ML nebulizer solution 3 mL  3 mL Nebulization Q6H PRN Lily Kocher, MD      . ondansetron [facility]) injection 4 mg  4 mg Intravenous Q6H PRN Lily Kocher, MD      . OXcarbazepine (TRILEPTAL) tablet 75 mg  75 mg Oral BID Lily Kocher, MD   75 mg at 07/20/16 0841  . polyethylene glycol (MIRALAX / GLYCOLAX) packet 17 g  17 g Oral Daily Belkys A Regalado, MD   17 g at 07/20/16 1240  . potassium chloride SA (K-DUR,KLOR-CON) CR tablet 20 mEq  20 mEq Oral Daily Lily Kocher, MD   20 mEq at 07/20/16 0841  . simvastatin (ZOCOR) tablet 5 mg  5 mg Oral q1800 Lily Kocher, MD      . traMADol Veatrice Bourbon) tablet 50 mg  50 mg Oral Q6H PRN Lily Kocher, MD      . vancomycin (VANCOCIN) IVPB 750 mg/150 ml premix  750 mg Intravenous Q24H Randa Spike, [facility]         Discharge Medications: Please see discharge summary for a list of discharge medications.  Relevant Imaging Results:  Relevant Lab Results:   Additional Information SS #  [id number]  Lia Hopping, LCSW

## 2016-07-20 NOTE — Progress Notes (Signed)
PROGRESS NOTE    Cay Meanor  [id number] DOB: May 30, 1929 DOA: 07/19/2016 PCP: Geoffery Lyons, MD    Brief Narrative: Elean Karras is a 82 y.o. woman with a history of chronic atrial fibrillation (CHADS-Vasc score of 5, anticoagulated with Eliquis), HTN, COPD, diastolic heart failure, and CKD 3 who has had recent lymph node biopsy (results have NOT been disclosed to the patient).  She presented to the ED last night complaining of abdominal pain.  CT A/P without contrast did not show findings to explain her acute symptoms.  She was sent home and followed up with her PCP today as recommended.  There, she reported at least three episodes of vomiting last night.  No diarrhea.  Abdominal pain has actually resolved, but fever to 103 and mild hypoxia were noted at her PCP's office, which prompted referral to the ED.  Patient reports recent dry cough.  No sputum production.  She wears 2L [place] qHS at baseline; she typically does not need it during the day.  No chest pain or shortness of breath.  She has had low back pain for three weeks.  She denies any falls or trauma.    Assessment & Plan:   Principal Problem:   Sepsis ([place]) Active Problems:   Atrial fibrillation, chronic (HCC)   Trigeminal neuralgia   HTN (hypertension)   Chronic renal impairment, stage 3 (moderate)   Chronic systolic heart failure (HCC)   Aspiration pneumonia (HCC)   Fever   Leukocytosis   Sepsis secondary to pneumonia, aspiration (recent nausea and vomiting) vs HCAP --Continue IV cefepime and vancomycin per protocol --Blood and urine cultures pending --Urine streptococcal antigen per Pneumonia admission orderset --lactic acid level and procalcitonin levels normal.  --WBC trending down.  --chest in 24- hours  COPD --Supplemental O2 as needed --Duonebs q6h prn  Chronic atrial fibrillation --Continue Eliquis, coreg  Chronic Diastolic HF --weight: 61 kg at baseline. -will resume lasix.   CKD 3;  cr per records 1.4---1.8.  --Appears stable  Appears to have a chronic anemia (likely multifactorial, on iron supplement at home) --On Eliquis, recent lymph node biopsy, monitor for transfusion requirement  Hypokalemia --Replacement ordered.  Hyponatremia; Improved.    DVT prophylaxis: on Eliquis.  Code Status: DNR Family Communication: Daughter at bedside.  Disposition Plan: to be determine/    Consultants:   none   Procedures:  none  Antimicrobials:   Cefepime 8-16  Vancomycin 8-16   Subjective: Feeling tired, weak, was not able to sleep last night.  Mild SOB.    Objective: Vitals:   07/19/16 2102 07/19/16 2200 07/20/16 0230 07/20/16 0530  BP: 124/72 121/61 (!) 142/68 126/68  Pulse: 75 81 (!) 112 92  Resp: 26 26 20 20   Temp: 98 F (36.7 C)  99.5 F (37.5 C) 98.2 F (36.8 C)  TempSrc: Oral  Oral Oral  SpO2: 99% 97% 96% 98%  Weight:      Height:       No intake or output data in the 24 hours ending 07/20/16 0856 Filed Weights   07/19/16 1806 07/19/16 1937  Weight: 61.2 kg (135 lb) 61.2 kg (135 lb)    Examination:  General exam: Appears calm and comfortable  Respiratory system: Clear to auscultation. Respiratory effort normal. Cardiovascular system: S1 & S2 heard, RRR. Positive JVD, murmurs, rubs, gallops or clicks. No pedal edema. Gastrointestinal system: Abdomen is nondistended, soft and nontender. No organomegaly or masses felt. Normal bowel sounds heard. Central nervous system: Alert and oriented. No focal  neurological deficits. Extremities: Symmetric 5 x 5 power. Skin: No rashes, lesions or ulcers Psychiatry: Judgement and insight appear normal. Mood & affect appropriate.     Data Reviewed: I have personally reviewed following labs and imaging studies  CBC:  Recent Labs Lab 07/18/16 1355 07/19/16 1957 07/20/16 0325  WBC 5.8 11.9* 10.9*  NEUTROABS  --  8.3*  --   HGB 10.5* 9.9* 9.7*  HCT 33.1* 29.4* 29.2*  MCV 97.6 96.7  96.1  PLT 241 233 0000000   Basic Metabolic Panel:  Recent Labs Lab 07/18/16 1355 07/19/16 1957 07/20/16 0325  NA 130* 131* 133*  K 3.4* 3.3* 3.1*  CL 91* 90* 95*  CO2 28 30 27   GLUCOSE 106* 108* 104*  BUN 24* 25* 25*  CREATININE 1.59* 1.72* 1.32*  CALCIUM 9.8 9.4 8.5*   GFR: Estimated Creatinine Clearance: 27.3 mL/min (by C-G formula based on SCr of 1.32 mg/dL). Liver Function Tests:  Recent Labs Lab 07/18/16 1355 07/19/16 1957  AST 31 27  ALT 18 14  ALKPHOS 73 69  BILITOT 0.6 0.8  PROT 8.1 7.7  ALBUMIN 3.7 3.9    Recent Labs Lab 07/18/16 1355 07/19/16 1957  LIPASE 36 31   No results for input(s): AMMONIA in the last 168 hours. Coagulation Profile:  Recent Labs Lab 07/20/16 0325  INR 1.24   Cardiac Enzymes:  Recent Labs Lab 07/19/16 1957  TROPONINI 0.03*   BNP (last 3 results) No results for input(s): PROBNP in the last 8760 hours. HbA1C: No results for input(s): HGBA1C in the last 72 hours. CBG: No results for input(s): GLUCAP in the last 168 hours. Lipid Profile: No results for input(s): CHOL, HDL, LDLCALC, TRIG, CHOLHDL, LDLDIRECT in the last 72 hours. Thyroid Function Tests: No results for input(s): TSH, T4TOTAL, FREET4, T3FREE, THYROIDAB in the last 72 hours. Anemia Panel: No results for input(s): VITAMINB12, FOLATE, FERRITIN, TIBC, IRON, RETICCTPCT in the last 72 hours. Sepsis Labs:  Recent Labs Lab 07/19/16 2008 07/19/16 2306 07/20/16 0325  PROCALCITON  --   --  0.15  LATICACIDVEN 1.20 1.09 0.8    Recent Results (from the past 240 hour(s))  Blood Culture (routine x 2)     Status: None (Preliminary result)   Collection Time: 07/19/16  7:45 PM  Result Value Ref Range Status   Specimen Description   Final    BLOOD RIGHT ANTECUBITAL Performed at [facility]    Special Requests BOTTLES DRAWN AEROBIC AND ANAEROBIC 5CC  Final   Culture PENDING  Incomplete   Report Status PENDING  Incomplete  Blood Culture (routine x 2)      Status: None (Preliminary result)   Collection Time: 07/19/16  7:57 PM  Result Value Ref Range Status   Specimen Description   Final    BLOOD LEFT ANTECUBITAL Performed at [facility]  Final   Culture PENDING  Incomplete   Report Status PENDING  Incomplete         Radiology Studies: Ct Abdomen Pelvis Wo Contrast  Result Date: 07/18/2016 CLINICAL DATA:  Abdominal pain. Recent retroperitoneal lymph node biopsy 06/30/2016 revealing adenocarcinoma EXAM: CT ABDOMEN AND PELVIS WITHOUT CONTRAST TECHNIQUE: Multidetector CT imaging of the abdomen and pelvis was performed following the standard protocol without IV contrast. COMPARISON:  CT abdomen 05/05/2016 FINDINGS: Lower chest: Marked cardiac enlargement. Marked right atrial enlargement. No pericardial effusion. Hepatobiliary: Unenhanced images of the liver demonstrate no liver mass. Normal liver size.  Gallbladder and bile ducts normal. Pancreas: Negative Spleen: Negative Adrenals/Urinary Tract: No renal obstruction. No mass or stone. Urinary bladder well distended without focal mass. Stomach/Bowel: Stomach and duodenum normal. Negative for bowel obstruction. No bowel mass or edema. Vascular/Lymphatic: Atherosclerotic calcification in the aorta without aneurysm. Periaortic lymphadenopathy appears unchanged. Left periaortic lymph node 22 x 23 mm. Left lower periaortic lymph node 20 x 29 mm. Left hypogastric lymph node 17 mm. Lymph node anterior to the aorta below the SMA measures 13 x 25 mm. Reproductive: Normal uterus.  No pelvic mass. Other: No free fluid or free air.  Negative for hernia. Musculoskeletal: Moderate lumbar disc and facet degeneration. Negative for fracture or metastatic disease. No acute skeletal abnormality. IMPRESSION: Periaortic adenopathy similar to 05/05/2016. Recent biopsy revealed adenocarcinoma. Primary lesion not identified. No superimposed acute abnormality to explain  the patient's acute abdominal pain Marked cardiac enlargement. Electronically Signed   By: Franchot Gallo M.D.   On: 07/18/2016 16:09   Dg Chest 2 View  Result Date: 07/19/2016 CLINICAL DATA:  Fever with chest congestion. EXAM: CHEST  2 VIEW COMPARISON:  02/28/2016. FINDINGS: The cardio pericardial silhouette is enlarged. There is pulmonary vascular congestion without overt pulmonary edema. Likely underlying component of interstitial edema. No focal airspace consolidation. No substantial pleural effusion. Bones are diffusely demineralized. Degenerative changes noted right shoulder. Telemetry leads overlie the chest. IMPRESSION: Cardiomegaly with interstitial pulmonary edema. Electronically Signed   By: Misty Stanley M.D.   On: 07/19/2016 20:44        Scheduled Meds: . allopurinol  200 mg Oral Daily  . apixaban  2.5 mg Oral BID  . carvedilol  6.25 mg Oral BID WC  . ceFEPime (MAXIPIME) IV  2 g Intravenous Q24H  . gabapentin  600 mg Oral 3 times per day  . OXcarbazepine  75 mg Oral BID  . potassium chloride SA  20 mEq Oral Daily  . simvastatin  5 mg Oral q1800  . vancomycin  750 mg Intravenous Q24H   Continuous Infusions:    LOS: 1 day    Time spent: 25 minutes.     Elmarie Shiley, MD [facility] Pager [phone]  If 7PM-7AM, please contact night-coverage [email or web address] Password [facility] 07/20/2016, 8:56 AM

## 2016-07-21 ENCOUNTER — Inpatient Hospital Stay (HOSPITAL_COMMUNITY): Payer: Medicare Other

## 2016-07-21 LAB — BASIC METABOLIC PANEL
Anion gap: 8 (ref 5–15)
BUN: 23 mg/dL — ABNORMAL HIGH (ref 6–20)
CALCIUM: 8.6 mg/dL — AB (ref 8.9–10.3)
CO2: 30 mmol/L (ref 22–32)
CREATININE: 1.47 mg/dL — AB (ref 0.44–1.00)
Chloride: 93 mmol/L — ABNORMAL LOW (ref 101–111)
GFR calc non Af Amer: 32 mL/min — ABNORMAL LOW (ref >60)
GFR, EST AFRICAN AMERICAN: 37 mL/min — AB (ref >60)
Glucose, Bld: 98 mg/dL (ref 65–99)
Potassium: 4 mmol/L (ref 3.5–5.1)
SODIUM: 131 mmol/L — AB (ref 135–145)

## 2016-07-21 LAB — CBC
HCT: 28.7 % — ABNORMAL LOW (ref 36.0–46.0)
Hemoglobin: 9.1 g/dL — ABNORMAL LOW (ref 12.0–15.0)
MCH: 30.7 pg (ref 26.0–34.0)
MCHC: 31.7 g/dL (ref 30.0–36.0)
MCV: 97 fL (ref 78.0–100.0)
PLATELETS: 161 10*3/uL (ref 150–400)
RBC: 2.96 MIL/uL — ABNORMAL LOW (ref 3.87–5.11)
RDW: 15 % (ref 11.5–15.5)
WBC: 10.1 10*3/uL (ref 4.0–10.5)

## 2016-07-21 LAB — URINE CULTURE: Culture: <10000 — AB

## 2016-07-21 MED ORDER — DEXTROSE 5 % IV SOLN
1.0000 g | INTRAVENOUS | Status: DC
  Administered 2016-07-21: 1 g via INTRAVENOUS
  Filled 2016-07-21: qty 1

## 2016-07-21 MED ORDER — SACCHAROMYCES BOULARDII 250 MG PO CAPS
250.0000 mg | ORAL_CAPSULE | Freq: Two times a day (BID) | ORAL | Status: DC
  Administered 2016-07-21 – 2016-07-22 (×3): 250 mg via ORAL
  Filled 2016-07-21 (×3): qty 1

## 2016-07-21 MED ORDER — SENNOSIDES-DOCUSATE SODIUM 8.6-50 MG PO TABS
1.0000 | ORAL_TABLET | Freq: Two times a day (BID) | ORAL | Status: DC
  Administered 2016-07-21 – 2016-07-22 (×3): 1 via ORAL
  Filled 2016-07-21 (×3): qty 1

## 2016-07-21 MED ORDER — BISACODYL 5 MG PO TBEC: 5.0000 mg | DELAYED_RELEASE_TABLET | Freq: Every day | ORAL | Status: DC | PRN

## 2016-07-21 NOTE — Evaluation (Signed)
Physical Therapy Evaluation Patient Details Name: Angela Franco MRN: [id number] DOB: [birth year] Today's Date: 07/21/2016   History of Present Illness  Angela Franco is a 82 y.o. woman with a history of chronic atrial fibrillation (CHADS-Vasc score of 5, anticoagulated with Eliquis), HTN, COPD, diastolic heart failure, and CKD 3 who has had recent lymph node biopsy (results have NOT been disclosed to the patient).  She presented to the ED 07/19/16 complaining of abdominal pain.  Clinical Impression  Pt admitted with above diagnosis. Pt currently with functional limitations due to the deficits listed below (see PT Problem List). * Pt will benefit from skilled PT to increase their independence and safety with mobility to allow discharge to the venue listed below.       Follow Up Recommendations Home health PT (at ALF)    Equipment Recommendations  None recommended by PT    Recommendations for Other Services       Precautions / Restrictions        Mobility  Bed Mobility               General bed mobility comments: in chair  Transfers Overall transfer level: Needs assistance Equipment used: Rolling walker (2 wheeled) Transfers: Sit to/from Stand Sit to Stand: Min guard         General transfer comment: for safety  Ambulation/Gait Ambulation/Gait assistance: Min guard Ambulation Distance (Feet): 160 Feet Assistive device: Rolling walker (2 wheeled) Gait Pattern/deviations: Step-through pattern;Decreased stride length Gait velocity: incr WOB, sats 100% at rest on 2L, had difficulty getting a good reading on dynamap--with improved wave form sats 91-94% on RA; O2 replaced once pt back to room and RN aware   General Gait Details: cues for RW safety and breathing  Stairs            Wheelchair Mobility    Modified Rankin (Stroke Patients Only)       Balance Overall balance assessment: Needs assistance   Sitting balance-Leahy Scale: Good     Standing  balance support: During functional activity Standing balance-Leahy Scale: Fair Standing balance comment: at least fair; mod challenges not given; pt i sable to walk forward and back 4' without UE support/min-guard                             Pertinent Vitals/Pain Pain Assessment: No/denies pain    Home Living Family/patient expects to be discharged to:: Assisted living Living Arrangements: Alone             Home Equipment: Walker - 2 wheels;Grab bars - toilet;Shower seat - built in;Hand held shower head;Grab bars - tub/shower Additional Comments: Uses O2 PRN    Prior Function Level of Independence: Independent with assistive device(s)         Comments: use of RW and O2 when needed and at night; walks to dining hall and attends multiple events at ALF     Hand Dominance        Extremity/Trunk Assessment   Upper Extremity Assessment: Overall WFL for tasks assessed           Lower Extremity Assessment: Overall WFL for tasks assessed         Communication   Communication: No difficulties  Cognition Arousal/Alertness: Awake/alert Behavior During Therapy: WFL for tasks assessed/performed Overall Cognitive Status: Within Functional Limits for tasks assessed  General Comments      Exercises        Assessment/Plan    PT Assessment Patient needs continued PT services  PT Diagnosis Difficulty walking   PT Problem List Decreased activity tolerance;Decreased mobility  PT Treatment Interventions DME instruction;Functional mobility training;Gait training;Therapeutic activities;Therapeutic exercise;Patient/family education   PT Goals (Current goals can be found in the Care Plan section) Acute Rehab PT Goals Patient Stated Goal: home PT Goal Formulation: With patient Time For Goal Achievement: 08-14-2016 Potential to Achieve Goals: Good    Frequency Min 3X/week   Barriers to discharge        Co-evaluation                End of Session Equipment Utilized During Treatment: Gait belt Activity Tolerance: Patient tolerated treatment well Patient left: in chair           Time: 1204-1221 PT Time Calculation (min) (ACUTE ONLY): 17 min   Charges:   PT Evaluation $PT Eval Low Complexity: 1 Procedure     PT G Codes:        Angela Franco 2016-08-14, 12:34 PM

## 2016-07-21 NOTE — Progress Notes (Signed)
PROGRESS NOTE    Angela Franco  [id number] DOB: 1929-07-08 DOA: 07/19/2016 PCP: Geoffery Lyons, MD    Brief Narrative: Angela Franco is a 82 y.o. woman with a history of chronic atrial fibrillation (CHADS-Vasc score of 5, anticoagulated with Eliquis), HTN, COPD, diastolic heart failure, and CKD 3 who has had recent lymph node biopsy (results have NOT been disclosed to the patient).  She presented to the ED last night complaining of abdominal pain.  CT A/P without contrast did not show findings to explain her acute symptoms.  She was sent home and followed up with her PCP today as recommended.  There, she reported at least three episodes of vomiting last night.  No diarrhea.  Abdominal pain has actually resolved, but fever to 103 and mild hypoxia were noted at her PCP's office, which prompted referral to the ED.  Patient reports recent dry cough.  No sputum production.  She wears 2L [place] qHS at baseline; she typically does not need it during the day.  No chest pain or shortness of breath.  She has had low back pain for three weeks.  She denies any falls or trauma.    Assessment & Plan:   Principal Problem:   Sepsis ([place]) Active Problems:   Atrial fibrillation, chronic (HCC)   Trigeminal neuralgia   HTN (hypertension)   Chronic renal impairment, stage 3 (moderate)   Chronic systolic heart failure (HCC)   Aspiration pneumonia (HCC)   Fever   Leukocytosis   Sepsis secondary to pneumonia, aspiration (recent nausea and vomiting) vs HCAP --Continue IV cefepime and vancomycin per protocol --Blood and urine cultures no growth to date,.  --Urine streptococcal antigen negative --lactic acid level and procalcitonin levels normal.  --WBC trending down.  --chest x ray with atelectasis, vascular congestion. Due to fever will treat for PNA>   COPD --Supplemental O2 as needed --Duonebs q6h prn  Chronic atrial fibrillation --Continue Eliquis, coreg  Chronic Diastolic  HF --weight: 61 kg at baseline. -continue with  lasix. Home dose. Monitor renal function   CKD 3; cr per records 1.4---1.8.  --Appears stable  Appears to have a chronic anemia (likely multifactorial, on iron supplement at home) --On Eliquis, recent lymph node biopsy, monitor for transfusion requirement  Hypokalemia --Replacement ordered.  Hyponatremia; Fluctuates.     DVT prophylaxis: on Eliquis.  Code Status: DNR Family Communication: Daughter at bedside.  Disposition Plan: ASL when afebrile.    Consultants:   none   Procedures:  none  Antimicrobials:   Cefepime 8-16  Vancomycin 8-16   Subjective: She is feeling much better today, more energy. Breathing better.    Objective: Vitals:   07/20/16 2152 07/20/16 2226 07/21/16 0546 07/21/16 0738  BP: (!) 90/55  115/60 109/72  Pulse: 85  81 90  Resp: 18  16 17   Temp: 97.4 F (36.3 C)  98.2 F (36.8 C) 98.9 F (37.2 C)  TempSrc: Oral  Oral Oral  SpO2: 100%  99% 98%  Weight:  61.2 kg (134 lb 14.7 oz) 60.7 kg (133 lb 14.4 oz)   Height:        Intake/Output Summary (Last 24 hours) at 07/21/16 1429 Last data filed at 07/21/16 1300  Gross per 24 hour  Intake              680 ml  Output                0 ml  Net  680 ml   Filed Weights   07/19/16 1937 07/20/16 2226 07/21/16 0546  Weight: 61.2 kg (135 lb) 61.2 kg (134 lb 14.7 oz) 60.7 kg (133 lb 14.4 oz)    Examination:  General exam: Appears calm and comfortable  Respiratory system:  Respiratory effort normal. Bilateral crackles.  Cardiovascular system: S1 & S2 heard, RRR. Positive JVD, murmurs, rubs, gallops or clicks. No pedal edema. Gastrointestinal system: Abdomen is nondistended, soft and nontender. No organomegaly or masses felt. Normal bowel sounds heard. Central nervous system: Alert and oriented. No focal neurological deficits. Extremities: Symmetric 5 x 5 power. Skin: No rashes, lesions or ulcers Psychiatry: Judgement and  insight appear normal. Mood & affect appropriate.     Data Reviewed: I have personally reviewed following labs and imaging studies  CBC:  Recent Labs Lab 07/18/16 1355 07/19/16 1957 07/20/16 0325 07/21/16 0501  WBC 5.8 11.9* 10.9* 10.1  NEUTROABS  --  8.3*  --   --   HGB 10.5* 9.9* 9.7* 9.1*  HCT 33.1* 29.4* 29.2* 28.7*  MCV 97.6 96.7 96.1 97.0  PLT 241 233 175 [id number]   Basic Metabolic Panel:  Recent Labs Lab 07/18/16 1355 07/19/16 1957 07/20/16 0325 07/21/16 0501  NA 130* 131* 133* 131*  K 3.4* 3.3* 3.1* 4.0  CL 91* 90* 95* 93*  CO2 28 30 27 30   GLUCOSE 106* 108* 104* 98  BUN 24* 25* 25* 23*  CREATININE 1.59* 1.72* 1.32* 1.47*  CALCIUM 9.8 9.4 8.5* 8.6*   GFR: Estimated Creatinine Clearance: 24.4 mL/min (by C-G formula based on SCr of 1.47 mg/dL). Liver Function Tests:  Recent Labs Lab 07/18/16 1355 07/19/16 1957  AST 31 27  ALT 18 14  ALKPHOS 73 69  BILITOT 0.6 0.8  PROT 8.1 7.7  ALBUMIN 3.7 3.9    Recent Labs Lab 07/18/16 1355 07/19/16 1957  LIPASE 36 31   No results for input(s): AMMONIA in the last 168 hours. Coagulation Profile:  Recent Labs Lab 07/20/16 0325  INR 1.24   Cardiac Enzymes:  Recent Labs Lab 07/19/16 1957  TROPONINI 0.03*   BNP (last 3 results) No results for input(s): PROBNP in the last 8760 hours. HbA1C: No results for input(s): HGBA1C in the last 72 hours. CBG: No results for input(s): GLUCAP in the last 168 hours. Lipid Profile: No results for input(s): CHOL, HDL, LDLCALC, TRIG, CHOLHDL, LDLDIRECT in the last 72 hours. Thyroid Function Tests: No results for input(s): TSH, T4TOTAL, FREET4, T3FREE, THYROIDAB in the last 72 hours. Anemia Panel: No results for input(s): VITAMINB12, FOLATE, FERRITIN, TIBC, IRON, RETICCTPCT in the last 72 hours. Sepsis Labs:  Recent Labs Lab 07/19/16 2008 07/19/16 2306 07/20/16 0325  PROCALCITON  --   --  0.15  LATICACIDVEN 1.20 1.09 0.8    Recent Results (from the past  240 hour(s))  Urine culture     Status: Abnormal   Collection Time: 07/19/16  7:26 PM  Result Value Ref Range Status   Specimen Description URINE, RANDOM  Final   Special Requests NONE  Final   Culture (A)  Final    <10,000 COLONIES/mL INSIGNIFICANT GROWTH Performed at [facility]    Report Status 07/21/2016 FINAL  Final  Blood Culture (routine x 2)     Status: None (Preliminary result)   Collection Time: 07/19/16  7:45 PM  Result Value Ref Range Status   Specimen Description BLOOD RIGHT ANTECUBITAL  Final   Special Requests BOTTLES DRAWN AEROBIC AND ANAEROBIC 5CC  Final  Culture   Final    NO GROWTH 2 DAYS Performed at [facility]    Report Status PENDING  Incomplete  Blood Culture (routine x 2)     Status: None (Preliminary result)   Collection Time: 07/19/16  7:57 PM  Result Value Ref Range Status   Specimen Description BLOOD LEFT ANTECUBITAL  Final   Special Requests BOTTLES DRAWN AEROBIC ONLY 5CC  Final   Culture   Final    NO GROWTH 2 DAYS Performed at [facility]    Report Status PENDING  Incomplete         Radiology Studies: Dg Chest 2 View  Result Date: 07/21/2016 CLINICAL DATA:  Fever, cough. EXAM: CHEST  2 VIEW COMPARISON:  07/19/2016 FINDINGS: Cardiomegaly with vascular congestion he. Linear subsegmental atelectasis at the left base. No confluent opacity on the right. No effusions, edema or acute bony abnormality. IMPRESSION: Cardiomegaly, vascular congestion. Left base atelectasis. Electronically Signed   By: Rolm Baptise M.D.   On: 07/21/2016 08:33   Dg Chest 2 View  Result Date: 07/19/2016 CLINICAL DATA:  Fever with chest congestion. EXAM: CHEST  2 VIEW COMPARISON:  02/28/2016. FINDINGS: The cardio pericardial silhouette is enlarged. There is pulmonary vascular congestion without overt pulmonary edema. Likely underlying component of interstitial edema. No focal airspace consolidation. No substantial pleural effusion. Bones are  diffusely demineralized. Degenerative changes noted right shoulder. Telemetry leads overlie the chest. IMPRESSION: Cardiomegaly with interstitial pulmonary edema. Electronically Signed   By: Misty Stanley M.D.   On: 07/19/2016 20:44        Scheduled Meds: . allopurinol  200 mg Oral Daily  . apixaban  2.5 mg Oral BID  . carvedilol  6.25 mg Oral BID WC  . ceFEPime (MAXIPIME) IV  1 g Intravenous Q24H  . feeding supplement (ENSURE ENLIVE)  237 mL Oral BID BM  . furosemide  80 mg Oral Daily  . gabapentin  600 mg Oral 3 times per day  . OXcarbazepine  75 mg Oral BID  . polyethylene glycol  17 g Oral Daily  . potassium chloride SA  20 mEq Oral Daily  . senna-docusate  1 tablet Oral BID  . simvastatin  5 mg Oral q1800  . vancomycin  750 mg Intravenous Q24H   Continuous Infusions:    LOS: 2 days    Time spent: 25 minutes.     Elmarie Shiley, MD [facility] Pager [phone]  If 7PM-7AM, please contact night-coverage [email or web address] Password [facility] 07/21/2016, 2:29 PM

## 2016-07-21 NOTE — Progress Notes (Signed)
Pharmacy Antibiotic Note  Angela Franco is a 82 y.o. female admitted on 07/19/2016 with fever, increased WBC, and suspected sepsis.  PMH includes CKD-3, COPD, CHF, AFib on Eliquis, chronic anemia, trigeminal neuralgia, and a questionable diagnosis of lymphoma. Recent retroperitoneal lymph node biopsy (7/28) showed malignant cells.  She was seen yesterday at [facility] ED on 8/15 for abdominal pain (CT unremarkable for new acute issues, unclear etiology).  Pharmacy has been consulted for Cefepime and Vancomycin dosing for PNA (Asp vs HCAP)  Noted PCN allergy (hives) but patient tolerated several days of ceftazidime in 01/2016.  8/18: D3 Abx Cultures negative to date SCr bumped a little overnight (baseline 1.4-1.8 per notes), CrCl remains < 30 ml/min  Plan:  Reduce to Cefepime 1g IV q24h  Continue Vancomycin 750 mg IV q24h  Measure Vanc trough at steady state  Follow up renal fxn, culture results, and clinical course  Height: 5\' 2"  (157.5 cm) Weight: 133 lb 14.4 oz (60.7 kg) IBW/kg (Calculated) : 50.1  Temp (24hrs), Avg:98.7 F (37.1 C), Min:97.4 F (36.3 C), Max:100.4 F (38 C)   Recent Labs Lab 07/18/16 1355 07/19/16 1957 07/19/16 2008 07/19/16 2306 07/20/16 0325 07/21/16 0501  WBC 5.8 11.9*  --   --  10.9* 10.1  CREATININE 1.59* 1.72*  --   --  1.32* 1.47*  LATICACIDVEN  --   --  1.20 1.09 0.8  --     Estimated Creatinine Clearance: 24.4 mL/min (by C-G formula based on SCr of 1.47 mg/dL).    Allergies  Allergen Reactions  . Penicillins Hives    Has patient had a PCN reaction causing immediate rash, facial/tongue/throat swelling, SOB or lightheadedness with hypotension: No Has patient had a PCN reaction causing severe rash involving mucus membranes or skin necrosis: No Has patient had a PCN reaction that required hospitalization No Has patient had a PCN reaction occurring within the last 10 years: No If all of the above answers are "NO", then may proceed with Cephalosporin  use.    Antimicrobials this admission: 8/16 Cefepime >>  8/16 Vancomycin >>   Dose adjustments this admission: 8/18: Cefepime 2g q24h --> 1g q24h for CrCl < 30 ml/min  Microbiology results: 8/16 BCx: NGTD 8/16 UCx: Collected 8/16 Strep pneumo UAg: negative  Thank you for allowing pharmacy to be a part of this patient's care.  Ralene Bathe, PharmD, BCPS 07/21/2016, 8:08 AM  Pager: [phone]

## 2016-07-22 LAB — BASIC METABOLIC PANEL
ANION GAP: 10 (ref 5–15)
BUN: 25 mg/dL — ABNORMAL HIGH (ref 6–20)
CHLORIDE: 90 mmol/L — AB (ref 101–111)
CO2: 31 mmol/L (ref 22–32)
Calcium: 8.7 mg/dL — ABNORMAL LOW (ref 8.9–10.3)
Creatinine, Ser: 1.35 mg/dL — ABNORMAL HIGH (ref 0.44–1.00)
GFR calc non Af Amer: 35 mL/min — ABNORMAL LOW (ref >60)
GFR, EST AFRICAN AMERICAN: 41 mL/min — AB (ref >60)
GLUCOSE: 103 mg/dL — AB (ref 65–99)
Potassium: 3.3 mmol/L — ABNORMAL LOW (ref 3.5–5.1)
Sodium: 131 mmol/L — ABNORMAL LOW (ref 135–145)

## 2016-07-22 MED ORDER — CEFPODOXIME PROXETIL 200 MG PO TABS
200.0000 mg | ORAL_TABLET | Freq: Two times a day (BID) | ORAL | Status: DC
  Administered 2016-07-22: 200 mg via ORAL
  Filled 2016-07-22: qty 1

## 2016-07-22 MED ORDER — BISACODYL 5 MG PO TBEC: 5.0000 mg | DELAYED_RELEASE_TABLET | Freq: Every day | ORAL | 0 refills | Status: DC | PRN

## 2016-07-22 MED ORDER — TRAMADOL HCL 50 MG PO TABS: 50.0000 mg | ORAL_TABLET | Freq: Four times a day (QID) | ORAL | 0 refills | Status: DC | PRN

## 2016-07-22 MED ORDER — CEFPODOXIME PROXETIL 200 MG PO TABS: 200.0000 mg | ORAL_TABLET | Freq: Two times a day (BID) | ORAL | 0 refills | Status: DC

## 2016-07-22 MED ORDER — ENSURE ENLIVE PO LIQD: 237.0000 mL | Freq: Two times a day (BID) | ORAL | 12 refills | Status: DC

## 2016-07-22 MED ORDER — POTASSIUM CHLORIDE CRYS ER 20 MEQ PO TBCR
40.0000 meq | EXTENDED_RELEASE_TABLET | Freq: Once | ORAL | Status: AC
  Administered 2016-07-22: 40 meq via ORAL
  Filled 2016-07-22: qty 2

## 2016-07-22 MED ORDER — SACCHAROMYCES BOULARDII 250 MG PO CAPS: 250.0000 mg | ORAL_CAPSULE | Freq: Two times a day (BID) | ORAL | 0 refills | Status: DC

## 2016-07-22 NOTE — Discharge Summary (Addendum)
Physician Discharge Summary  Angela Franco [id number] DOB: [birth year] DOA: 80/16/2017  PCP: Geoffery Lyons, MD  Admit date: 07/19/2016 Discharge date: 07/22/2016  Admitted From: ALF Disposition:  ALF  Recommendations for Outpatient Follow-up:  1. Follow up with PCP in 1-2 weeks 2. Please obtain BMP/CBC in one week   Home Health; yes Equipment/Devices: oxygen   Discharge Condition:stable.  CODE STATUS: DNR Diet recommendation: Heart Healthy   Brief/Interim Summary: Brief Narrative: Angela Powellis a 82 y.o.woman with a history of chronic atrial fibrillation (CHADS-Vasc score of 5, anticoagulated with Eliquis), HTN, COPD, diastolic heart failure, and CKD 3 who has had recent lymph node biopsy (results have NOT been disclosed to the patient). She presented to the ED last night complaining of abdominal pain. CT A/P without contrast did not show findings to explain her acute symptoms. She was sent home and followed up with her PCP today as recommended. There, she reported at least three episodes of vomiting last night. No diarrhea. Abdominal pain has actually resolved, but fever to 103 and mild hypoxia were noted at her PCP's office, which prompted referral to the ED. Patient reports recent dry cough. No sputum production. She wears 2L [place] qHS at baseline; she typically does not need it during the day. No chest pain or shortness of breath. She has had low back pain for three weeks. She denies any falls or trauma.    Assessment & Plan:  Sepsis secondary to pneumonia, aspiration (recent nausea and vomiting) vs HCAP --Continue IV cefepime and vancomycin per protocol --Blood and urine cultures no growth to date,.  --Urine streptococcal antigen negative --lactic acid level and procalcitonin levels normal.  --WBC trending down.  --chest x ray with atelectasis, vascular congestion. Due to fever will treat for PNA>  --discharge on Cefpodoxime for 3 more days.    COPD --Supplemental O2 as needed --Duonebs q6h prn  Chronic atrial fibrillation --Continue Eliquis, coreg  Chronic Diastolic HF --weight: 61 kg at baseline. -continue with  lasix. Home dose. Monitor renal function   CKD 3; cr per records 1.4---1.8.  --Appears stable --await labs for this morning.   Appears to have a chronic anemia (likely multifactorial, on iron supplement at home) --On Eliquis, recent lymph node biopsy, monitor for transfusion requirement  Hypokalemia --Replacement ordered.  Hyponatremia; Fluctuates.   lymph node biopsy, adenocarcinoma, unknown primary; follow by PCP . Per family patient is not aware of results.   Discharge Diagnoses:  Principal Problem:   Sepsis ([place]) Active Problems:   Atrial fibrillation, chronic (HCC)   Trigeminal neuralgia   HTN (hypertension)   Chronic renal impairment, stage 3 (moderate)   Chronic systolic heart failure (HCC)   Aspiration pneumonia (HCC)   Fever   Leukocytosis    Discharge Instructions   Medication List    STOP taking these medications   ondansetron 4 MG tablet Commonly known as:  ZOFRAN     TAKE these medications   acetaminophen-codeine 300-30 MG tablet Commonly known as:  TYLENOL #3 Take 1-2 tablets by mouth every 6 (six) hours as needed for moderate pain.   allopurinol 100 MG tablet Commonly known as:  ZYLOPRIM Take 200 mg by mouth daily.   apixaban 2.5 MG Tabs tablet Commonly known as:  ELIQUIS Take 1 tablet (2.5 mg total) by mouth 2 (two) times daily.   bisacodyl 5 MG EC tablet Commonly known as:  DULCOLAX Take 1 tablet (5 mg total) by mouth daily as needed for moderate constipation.   calcium-vitamin D 500-200 MG-UNIT  tablet Commonly known as:  OSCAL WITH D Take 1 tablet by mouth daily with breakfast.   carvedilol 6.25 MG tablet Commonly known as:  COREG Take 1 tablet (6.25 mg total) by mouth 2 (two) times daily with a meal.   cefpodoxime 200 MG tablet Commonly known  as:  VANTIN Take 1 tablet (200 mg total) by mouth every 12 (twelve) hours.   feeding supplement (ENSURE ENLIVE) Liqd Take 237 mLs by mouth 2 (two) times daily between meals.   ferrous sulfate 325 (65 FE) MG tablet Take 325 mg by mouth daily with breakfast.   furosemide 80 MG tablet Commonly known as:  LASIX Take 1 tablet (80 mg total) by mouth daily.   gabapentin 600 MG tablet Commonly known as:  NEURONTIN Take 1 tablet (600 mg total) by mouth 3 (three) times daily. Please dose at 8am, 2pm and 8pm   ipratropium-albuterol 0.5-2.5 (3) MG/3ML Soln Commonly known as:  DUONEB Take 3 mLs by nebulization every 6 (six) hours as needed. What changed:  when to take this   Melatonin 10 MG Tabs Take 10 mg by mouth at bedtime.   OXcarbazepine 150 MG tablet Commonly known as:  TRILEPTAL Take 0.5 tablet twice daily. What changed:  how much to take  how to take this  when to take this  additional instructions   potassium chloride SA 20 MEQ tablet Commonly known as:  K-DUR,KLOR-CON Take 20 mEq by mouth daily.   saccharomyces boulardii 250 MG capsule Commonly known as:  FLORASTOR Take 1 capsule (250 mg total) by mouth 2 (two) times daily.   simvastatin 10 MG tablet Commonly known as:  ZOCOR Take 0.5 tablets (5 mg total) by mouth daily at 6 PM.   traMADol 50 MG tablet Commonly known as:  ULTRAM Take 1 tablet (50 mg total) by mouth every 6 (six) hours as needed. What changed:  reasons to take this         Allergies  Allergen Reactions  . Penicillins Hives        Consultations:  none   Procedures/Studies: Ct Abdomen Pelvis Wo Contrast  Result Date: 07/18/2016 CLINICAL DATA:  Abdominal pain. Recent retroperitoneal lymph node biopsy 06/30/2016 revealing adenocarcinoma EXAM: CT ABDOMEN AND PELVIS WITHOUT CONTRAST TECHNIQUE: Multidetector CT imaging of the abdomen and pelvis was performed following the standard protocol without IV contrast. COMPARISON:  CT abdomen  05/05/2016 FINDINGS: Lower chest: Marked cardiac enlargement. Marked right atrial enlargement. No pericardial effusion. Hepatobiliary: Unenhanced images of the liver demonstrate no liver mass. Normal liver size. Gallbladder and bile ducts normal. Pancreas: Negative Spleen: Negative Adrenals/Urinary Tract: No renal obstruction. No mass or stone. Urinary bladder well distended without focal mass. Stomach/Bowel: Stomach and duodenum normal. Negative for bowel obstruction. No bowel mass or edema. Vascular/Lymphatic: Atherosclerotic calcification in the aorta without aneurysm. Periaortic lymphadenopathy appears unchanged. Left periaortic lymph node 22 x 23 mm. Left lower periaortic lymph node 20 x 29 mm. Left hypogastric lymph node 17 mm. Lymph node anterior to the aorta below the SMA measures 13 x 25 mm. Reproductive: Normal uterus.  No pelvic mass. Other: No free fluid or free air.  Negative for hernia. Musculoskeletal: Moderate lumbar disc and facet degeneration. Negative for fracture or metastatic disease. No acute skeletal abnormality. IMPRESSION: Periaortic adenopathy similar to 05/05/2016. Recent biopsy revealed adenocarcinoma. Primary lesion not identified. No superimposed acute abnormality to explain the patient's acute abdominal pain Marked cardiac enlargement. Electronically Signed   By: Franchot Gallo M.D.   On: 07/18/2016  16:09   Dg Chest 2 View  Result Date: 07/21/2016 CLINICAL DATA:  Fever, cough. EXAM: CHEST  2 VIEW COMPARISON:  07/19/2016 FINDINGS: Cardiomegaly with vascular congestion he. Linear subsegmental atelectasis at the left base. No confluent opacity on the right. No effusions, edema or acute bony abnormality. IMPRESSION: Cardiomegaly, vascular congestion. Left base atelectasis. Electronically Signed   By: Rolm Baptise M.D.   On: 07/21/2016 08:33   Dg Chest 2 View  Result Date: 07/19/2016 CLINICAL DATA:  Fever with chest congestion. EXAM: CHEST  2 VIEW COMPARISON:  02/28/2016. FINDINGS:  The cardio pericardial silhouette is enlarged. There is pulmonary vascular congestion without overt pulmonary edema. Likely underlying component of interstitial edema. No focal airspace consolidation. No substantial pleural effusion. Bones are diffusely demineralized. Degenerative changes noted right shoulder. Telemetry leads overlie the chest. IMPRESSION: Cardiomegaly with interstitial pulmonary edema. Electronically Signed   By: Misty Stanley M.D.   On: 07/19/2016 20:44   Ct Biopsy  Result Date: 06/30/2016 INDICATION: 82 year old female with a history of lymphadenopathy EXAM: CT BIOPSY MEDICATIONS: None. ANESTHESIA/SEDATION: Moderate (conscious) sedation was employed during this procedure. A total of Versed 1.0 mg and Fentanyl 50 mcg was administered intravenously. Moderate Sedation Time: 10 minutes. The patient's level of consciousness and vital signs were monitored continuously by radiology nursing throughout the procedure under my direct supervision. FLUOROSCOPY TIME:  CT COMPLICATIONS: None PROCEDURE: Informed written consent was obtained from the patient after a thorough discussion of the procedural risks, benefits and alternatives. All questions were addressed. Maximal Sterile Barrier Technique was utilized including caps, mask, sterile gowns, sterile gloves, sterile drape, hand hygiene and skin antiseptic. A timeout was performed prior to the initiation of the procedure. Patient position prone position on the CT table and a scout CT was acquired for planning purposes. The patient is then prepped and draped in the usual sterile fashion. The skin and subcutaneous tissues were generously infiltrated 1% lidocaine for local anesthesia. A left retroperitoneal lymph node was then targeted for biopsy by advancing a 17 gauge guide needle, confirming the tip with CT, with multiple 18 gauge biopsy acquired. Needle was removed and a final image was stored. Patient tolerated the procedure well and remained  hemodynamically stable throughout. No complications were encountered and no significant blood loss encountered. IMPRESSION: Status post CT-guided biopsy of left retroperitoneal lymph node. Tissue specimen sent to pathology for complete histopathologic analysis. Signed, Dulcy Fanny. Earleen Newport, DO [facility] Radiology Electronically Signed   By: Corrie Mckusick D.O.   On: 06/30/2016 13:12      Subjective: She is feeling better, with more energy. Denies dyspnea.   Discharge Exam: Vitals:   07/21/16 2158 07/22/16 0405  BP: 118/76 127/75  Pulse: 89 88  Resp: 19 20  Temp: 98.2 F (36.8 C) 98 F (36.7 C)   Vitals:   07/21/16 0738 07/21/16 1454 07/21/16 2158 07/22/16 0405  BP: 109/72 (!) 119/56 118/76 127/75  Pulse: 90 87 89 88  Resp: 17  19 20   Temp: 98.9 F (37.2 C) 98.4 F (36.9 C) 98.2 F (36.8 C) 98 F (36.7 C)  TempSrc: Oral Oral Oral Oral  SpO2: 98% 100% 99% 98%  Weight:    62.8 kg (138 lb 7.2 oz)  Height:        General: Pt is alert, awake, not in acute distress Cardiovascular: RRR, S1/S2 +, no rubs, no gallops Respiratory: , no wheezing, no rhonchi, crackles fine bases Abdominal: Soft, NT, ND, bowel sounds + Extremities:  no edema, no cyanosis    The results of significant diagnostics from this hospitalization (including imaging, microbiology, ancillary and laboratory) are listed below for reference.     Microbiology: Recent Results (from the past 240 hour(s))  Urine culture     Status: Abnormal   Collection Time: 07/19/16  7:26 PM  Result Value Ref Range Status   Specimen Description URINE, RANDOM  Final   Special Requests NONE  Final   Culture (A)  Final    <10,000 COLONIES/mL INSIGNIFICANT GROWTH Performed at [facility]    Report Status 07/21/2016 FINAL  Final  Blood Culture (routine x 2)     Status: None (Preliminary result)   Collection Time: 07/19/16  7:45 PM  Result Value Ref Range Status   Specimen  Description BLOOD RIGHT ANTECUBITAL  Final   Special Requests BOTTLES DRAWN AEROBIC AND ANAEROBIC 5CC  Final   Culture   Final    NO GROWTH 2 DAYS Performed at [facility]    Report Status PENDING  Incomplete  Blood Culture (routine x 2)     Status: None (Preliminary result)   Collection Time: 07/19/16  7:57 PM  Result Value Ref Range Status   Specimen Description BLOOD LEFT ANTECUBITAL  Final   Special Requests BOTTLES DRAWN AEROBIC ONLY 5CC  Final   Culture   Final    NO GROWTH 2 DAYS Performed at [facility]    Report Status PENDING  Incomplete     Labs: BNP (last 3 results)  Recent Labs  01/02/16 1304 01/20/16 2241 02/28/16 1020  BNP 273.0* 234.3* [id number]   Basic Metabolic Panel:  Recent Labs Lab 07/18/16 1355 07/19/16 1957 07/20/16 0325 07/21/16 0501  NA 130* 131* 133* 131*  K 3.4* 3.3* 3.1* 4.0  CL 91* 90* 95* 93*  CO2 28 30 27 30   GLUCOSE 106* 108* 104* 98  BUN 24* 25* 25* 23*  CREATININE 1.59* 1.72* 1.32* 1.47*  CALCIUM 9.8 9.4 8.5* 8.6*   Liver Function Tests:  Recent Labs Lab 07/18/16 1355 07/19/16 1957  AST 31 27  ALT 18 14  ALKPHOS 73 69  BILITOT 0.6 0.8  PROT 8.1 7.7  ALBUMIN 3.7 3.9    Recent Labs Lab 07/18/16 1355 07/19/16 1957  LIPASE 36 31   No results for input(s): AMMONIA in the last 168 hours. CBC:  Recent Labs Lab 07/18/16 1355 07/19/16 1957 07/20/16 0325 07/21/16 0501  WBC 5.8 11.9* 10.9* 10.1  NEUTROABS  --  8.3*  --   --   HGB 10.5* 9.9* 9.7* 9.1*  HCT 33.1* 29.4* 29.2* 28.7*  MCV 97.6 96.7 96.1 97.0  PLT 241 233 175 161   Cardiac Enzymes:  Recent Labs Lab 07/19/16 1957  TROPONINI 0.03*   BNP: Invalid input(s): POCBNP CBG: No results for input(s): GLUCAP in the last 168 hours. D-Dimer No results for input(s): DDIMER in the last 72 hours. Hgb A1c No results for input(s): HGBA1C in the last 72 hours. Lipid Profile No results for input(s): CHOL, HDL, LDLCALC, TRIG, CHOLHDL, LDLDIRECT in  the last 72 hours. Thyroid function studies No results for input(s): TSH, T4TOTAL, T3FREE, THYROIDAB in the last 72 hours.  Invalid input(s): FREET3 Anemia work up No results for input(s): VITAMINB12, FOLATE, FERRITIN, TIBC, IRON, RETICCTPCT in the last 72 hours. Urinalysis    Component Value Date/Time   COLORURINE YELLOW 07/19/2016 1926   APPEARANCEUR CLEAR 07/19/2016 1926   LABSPEC 1.010 07/19/2016 1926   PHURINE 6.5 07/19/2016 1926  GLUCOSEU NEGATIVE 07/19/2016 1926   HGBUR TRACE (A) 07/19/2016 1926   BILIRUBINUR NEGATIVE 07/19/2016 1926   KETONESUR NEGATIVE 07/19/2016 1926   PROTEINUR NEGATIVE 07/19/2016 1926   UROBILINOGEN 0.2 08/23/2012 1449   NITRITE NEGATIVE 07/19/2016 1926   LEUKOCYTESUR TRACE (A) 07/19/2016 1926   Sepsis Labs Invalid input(s): PROCALCITONIN,  WBC,  LACTICIDVEN Microbiology Recent Results (from the past 240 hour(s))  Urine culture     Status: Abnormal   Collection Time: 07/19/16  7:26 PM  Result Value Ref Range Status   Specimen Description URINE, RANDOM  Final   Special Requests NONE  Final   Culture (A)  Final    <10,000 COLONIES/mL INSIGNIFICANT GROWTH Performed at [facility]    Report Status 07/21/2016 FINAL  Final  Blood Culture (routine x 2)     Status: None (Preliminary result)   Collection Time: 07/19/16  7:45 PM  Result Value Ref Range Status   Specimen Description BLOOD RIGHT ANTECUBITAL  Final   Special Requests BOTTLES DRAWN AEROBIC AND ANAEROBIC 5CC  Final   Culture   Final    NO GROWTH 2 DAYS Performed at [facility]    Report Status PENDING  Incomplete  Blood Culture (routine x 2)     Status: None (Preliminary result)   Collection Time: 07/19/16  7:57 PM  Result Value Ref Range Status   Specimen Description BLOOD LEFT ANTECUBITAL  Final   Special Requests BOTTLES DRAWN AEROBIC ONLY 5CC  Final   Culture   Final    NO GROWTH 2 DAYS Performed at [facility]    Report Status PENDING  Incomplete      Time coordinating discharge: Over 30 minutes  SIGNED:   Elmarie Shiley, MD  [facility] 07/22/2016, 9:35 AM Pager [phone]  If 7PM-7AM, please contact night-coverage [email or web address] Password [email or web address]

## 2016-07-22 NOTE — Progress Notes (Signed)
Pt left at this time with her son and daughter-in-law. Alert, oriented, and without c/o. Discharge instructions/prescriptions given and discussed with son (son to deliver prescriptions to staff at [place]). Writer gave report to receiving nurse, Helene Kelp, at this time. Pt's son and daughter-in-law will take pt to ALF.

## 2016-07-22 NOTE — Clinical Social Work Note (Addendum)
Per MD patient ready for DC to [facility]. RN, patient, patient's family, and facility notified of DC. RN given number for report. Patient transported to facility by her son. CSW signing off.    Liz Beach MSW, [place], [place], [id number]

## 2016-07-24 LAB — CULTURE, BLOOD (ROUTINE X 2)
CULTURE: NO GROWTH
CULTURE: NO GROWTH

## 2016-08-08 ENCOUNTER — Telehealth: Payer: Self-pay | Admitting: Internal Medicine

## 2016-08-08 NOTE — Telephone Encounter (Signed)
Received records from [facility] for appointment on 08/22/16 with Dr Debara Pickett.  Records given to [facility] (medical records) for Dr Lysbeth Penner schedule on 08/22/16. lp

## 2016-08-20 ENCOUNTER — Emergency Department (HOSPITAL_COMMUNITY): Payer: Medicare Other

## 2016-08-20 ENCOUNTER — Emergency Department (HOSPITAL_COMMUNITY)
Admission: EM | Admit: 2016-08-20 | Discharge: 2016-08-20 | Disposition: A | Discharge status: Home / Self Care | Payer: Medicare Other | Attending: Emergency Medicine | Admitting: Emergency Medicine

## 2016-08-20 ENCOUNTER — Encounter (HOSPITAL_COMMUNITY): Payer: Self-pay | Admitting: Emergency Medicine

## 2016-08-20 DIAGNOSIS — Z79899 Other long term (current) drug therapy: Secondary | ICD-10-CM | POA: Insufficient documentation

## 2016-08-20 DIAGNOSIS — R0602 Shortness of breath: Secondary | ICD-10-CM | POA: Diagnosis not present

## 2016-08-20 DIAGNOSIS — Z85828 Personal history of other malignant neoplasm of skin: Secondary | ICD-10-CM | POA: Insufficient documentation

## 2016-08-20 DIAGNOSIS — N390 Urinary tract infection, site not specified: Secondary | ICD-10-CM | POA: Insufficient documentation

## 2016-08-20 DIAGNOSIS — N183 Chronic kidney disease, stage 3 (moderate): Secondary | ICD-10-CM | POA: Insufficient documentation

## 2016-08-20 DIAGNOSIS — I130 Hypertensive heart and chronic kidney disease with heart failure and stage 1 through stage 4 chronic kidney disease, or unspecified chronic kidney disease: Secondary | ICD-10-CM | POA: Diagnosis not present

## 2016-08-20 DIAGNOSIS — I5022 Chronic systolic (congestive) heart failure: Secondary | ICD-10-CM | POA: Insufficient documentation

## 2016-08-20 DIAGNOSIS — R509 Fever, unspecified: Secondary | ICD-10-CM | POA: Diagnosis present

## 2016-08-20 DIAGNOSIS — J441 Chronic obstructive pulmonary disease with (acute) exacerbation: Secondary | ICD-10-CM | POA: Insufficient documentation

## 2016-08-20 LAB — I-STAT CG4 LACTIC ACID, ED
Lactic Acid, Venous: 1.31 mmol/L (ref 0.5–1.9)
Lactic Acid, Venous: 1.3 mmol/L (ref 0.5–1.9)

## 2016-08-20 LAB — COMPREHENSIVE METABOLIC PANEL
ALBUMIN: 3.2 g/dL — AB (ref 3.5–5.0)
ALK PHOS: 69 U/L (ref 38–126)
ALT: 9 U/L — AB (ref 14–54)
AST: 23 U/L (ref 15–41)
Anion gap: 9 (ref 5–15)
BILIRUBIN TOTAL: 0.7 mg/dL (ref 0.3–1.2)
BUN: 28 mg/dL — AB (ref 6–20)
CO2: 30 mmol/L (ref 22–32)
CREATININE: 1.59 mg/dL — AB (ref 0.44–1.00)
Calcium: 9.1 mg/dL (ref 8.9–10.3)
Chloride: 93 mmol/L — ABNORMAL LOW (ref 101–111)
GFR calc Af Amer: 33 mL/min — ABNORMAL LOW (ref >60)
GFR, EST NON AFRICAN AMERICAN: 29 mL/min — AB (ref >60)
GLUCOSE: 113 mg/dL — AB (ref 65–99)
POTASSIUM: 3.4 mmol/L — AB (ref 3.5–5.1)
Sodium: 132 mmol/L — ABNORMAL LOW (ref 135–145)
TOTAL PROTEIN: 7.1 g/dL (ref 6.5–8.1)

## 2016-08-20 LAB — CBC WITH DIFFERENTIAL/PLATELET
BASOS ABS: 0 10*3/uL (ref 0.0–0.1)
Basophils Relative: 0 %
EOS ABS: 0 10*3/uL (ref 0.0–0.7)
EOS PCT: 0 %
HCT: 27.3 % — ABNORMAL LOW (ref 36.0–46.0)
Hemoglobin: 8.9 g/dL — ABNORMAL LOW (ref 12.0–15.0)
LYMPHS ABS: 1.8 10*3/uL (ref 0.7–4.0)
LYMPHS PCT: 17 %
MCH: 31.6 pg (ref 26.0–34.0)
MCHC: 32.6 g/dL (ref 30.0–36.0)
MCV: 96.8 fL (ref 78.0–100.0)
MONO ABS: 1.6 10*3/uL — AB (ref 0.1–1.0)
Monocytes Relative: 15 %
Neutro Abs: 7.4 10*3/uL (ref 1.7–7.7)
Neutrophils Relative %: 68 %
PLATELETS: 202 10*3/uL (ref 150–400)
RBC: 2.82 MIL/uL — AB (ref 3.87–5.11)
RDW: 15.7 % — AB (ref 11.5–15.5)
WBC: 10.8 10*3/uL — AB (ref 4.0–10.5)

## 2016-08-20 LAB — URINALYSIS, ROUTINE W REFLEX MICROSCOPIC
Bilirubin Urine: NEGATIVE
Glucose, UA: NEGATIVE mg/dL
Ketones, ur: NEGATIVE mg/dL
Nitrite: NEGATIVE
Protein, ur: 100 mg/dL — AB
Specific Gravity, Urine: 1.013 (ref 1.005–1.030)
pH: 6 (ref 5.0–8.0)

## 2016-08-20 LAB — URINE MICROSCOPIC-ADD ON

## 2016-08-20 MED ORDER — SODIUM CHLORIDE 0.9 % IV BOLUS (SEPSIS)
1000.0000 mL | Freq: Once | INTRAVENOUS | Status: AC
  Administered 2016-08-20: 1000 mL via INTRAVENOUS

## 2016-08-20 MED ORDER — VANCOMYCIN HCL 500 MG IV SOLR: 500.0000 mg | INTRAVENOUS | Status: DC

## 2016-08-20 MED ORDER — VANCOMYCIN HCL IN DEXTROSE 1-5 GM/200ML-% IV SOLN
1000.0000 mg | Freq: Once | INTRAVENOUS | Status: AC
  Administered 2016-08-20: 1000 mg via INTRAVENOUS
  Filled 2016-08-20: qty 200

## 2016-08-20 MED ORDER — DEXTROSE 5 % IV SOLN
2.0000 g | Freq: Once | INTRAVENOUS | Status: AC
  Administered 2016-08-20: 2 g via INTRAVENOUS
  Filled 2016-08-20: qty 2

## 2016-08-20 MED ORDER — ACETAMINOPHEN 500 MG PO TABS
1000.0000 mg | ORAL_TABLET | Freq: Once | ORAL | Status: AC
  Administered 2016-08-20: 1000 mg via ORAL
  Filled 2016-08-20: qty 2

## 2016-08-20 MED ORDER — SULFAMETHOXAZOLE-TRIMETHOPRIM 800-160 MG PO TABS: 1.0000 | ORAL_TABLET | Freq: Two times a day (BID) | ORAL | 0 refills | Status: AC

## 2016-08-20 MED ORDER — DEXTROSE 5 % IV SOLN: 1.0000 g | INTRAVENOUS | Status: DC

## 2016-08-20 NOTE — ED Notes (Signed)
Patient given peanut butter, crackers, and ginger ale per MD.

## 2016-08-20 NOTE — Progress Notes (Signed)
Pharmacy Antibiotic Note  Angela Franco is a 82 y.o. female admitted on 08/20/2016 with sepsis from suspected pneumonia.  Pharmacy has been consulted for vancomycin and cefepime dosing.  Plan:  Vancomycin 1000 mg IV now, then 500 mg IV q12 hr; goal trough 15-20 mcg/mL  Measure vancomycin trough levels at steady state as indicated  Cefepime 2 g IV x 1, then 1 g IV q24 hr  Follow clinical course, renal function, culture results as available  Follow for de-escalation of antibiotics and LOT   Weight: 130 lb (59 kg)  Temp (24hrs), Avg:98.9 F (37.2 C), Min:98.9 F (37.2 C), Max:98.9 F (37.2 C)  No results for input(s): WBC, CREATININE, LATICACIDVEN, VANCOTROUGH, VANCOPEAK, VANCORANDOM, GENTTROUGH, GENTPEAK, GENTRANDOM, TOBRATROUGH, TOBRAPEAK, TOBRARND, AMIKACINPEAK, AMIKACINTROU, AMIKACIN in the last 168 hours.  CrCl cannot be calculated (Patient's most recent lab result is older than the maximum 21 days allowed.).    Allergies  Allergen Reactions  . Penicillins Hives    Has patient had a PCN reaction causing immediate rash, facial/tongue/throat swelling, SOB or lightheadedness with hypotension: No Has patient had a PCN reaction causing severe rash involving mucus membranes or skin necrosis: No Has patient had a PCN reaction that required hospitalization No Has patient had a PCN reaction occurring within the last 10 years: No If all of the above answers are "NO", then may proceed with Cephalosporin use.    Antimicrobials this admission: 9/17 Vancomycin >>  9/17 Cefepime >>   Dose adjustments this admission: ---  Microbiology results:   Thank you for allowing pharmacy to be a part of this patient's care.  Reuel Boom, PharmD, BCPS Pager: [phone] 08/20/2016, 3:45 PM

## 2016-08-20 NOTE — ED Notes (Signed)
Bed: WA02 Expected date: 08/20/16 Expected time: 2:58 PM Means of arrival: Ambulance Comments: Ca pt, recent Pneumonia Shob

## 2016-08-20 NOTE — ED Triage Notes (Signed)
Pt's family took her temperature last night and said fever was 103.6. Pt was confused at nursing home and sat hunched over and did not seem herself. Pt has BP of 80/52 but does not say she feels different. Daughter says she has been more tired today than normal.

## 2016-08-20 NOTE — ED Provider Notes (Signed)
[facility] DEPT Provider Note   CSN: [id number] Arrival date & time: 08/20/16  1455     History   Chief Complaint Chief Complaint  Patient presents with  . Fever    HPI Angela Franco is a 82 y.o. female.  She presents with fever and confusion.  Significant past medical history of nonischemic cardio myopathy, permanent atrial fibrillation, anticoagulation with L [place], rate controlled with Coreg, CHF, last echo 2/17 with EF of 55-60, reactive airways disease with no smoking history, followed by pulmonary, on home O2 2 L at night and bronchodilators, stage III kidney disease, hyponatremia, left bundle branch block, recurrent pneumonias.  She was feeling well yesterday with exception of a cough. Last night had some nausea and a cough and vomited once. Family went to visit this morning. By 11:00 she was confused per family. Had recorded temp to 103.5 at her care facility. Had a pressure of 90 systolic on arrival. At my exam laying supine her blood pressures 101. Family states she is "better"  HPI  Past Medical History:  Diagnosis Date  . Acute facial pain   . Altered mental status 08/24/2012  . Anemia, with significant drop in H/H 08/24/2012  . Arthritis    "fingers" (02/28/2016)  . Atrial fibrillation ([place])   . Bursitis of hip   . CHF (congestive heart failure) ([place])   . Complication of anesthesia    "I'm slow to recover when I'm put to sleep"  . COPD (chronic obstructive pulmonary disease) ([place])    Never smoked. Questionable diagnosis.  [name] Dyslipidemia   . Heart disease   . Hypertension   . Hyponatremia   . Insomnia   . Kidney failure    Stage 3  . LBBB (left bundle branch block)   . Nonischemic cardiomyopathy ([place])   . On home oxygen therapy    "2L at night and prn during the day" (02/28/2016)  . Osteopenia   . Skin cancer of nose   . Trigeminal neuralgia   . UTI (lower urinary tract infection)     Patient Active Problem List   Diagnosis Date Noted  . Sepsis  ([place]) 07/19/2016  . Aspiration pneumonia ([place]) 07/19/2016  . Fever 07/19/2016  . Leukocytosis 07/19/2016  . Acute respiratory failure with hypoxia ([place]) 02/28/2016  . Acute renal failure superimposed on stage 3 chronic kidney disease ([place]) 02/28/2016  . Acute on chronic diastolic heart failure ([place])   . Acute on chronic systolic heart failure ([place])   . Respiratory syncytial virus (RSV) infection   . Chronic systolic heart failure ([place])   . COPD exacerbation ([place]) 01/21/2016  . Chronic renal impairment, stage 3 (moderate) 11/10/2015  . COPD by CXR 08/26/2012  . Non-ischemic cardiomyopathy EF improved from 30-35% to 40-45% by Echo in 07/2012 06/30/2012  . LBBB (left bundle branch block) 06/30/2012  . Chronic anticoagulation 06/30/2012  . HTN (hypertension) 06/30/2012  . Systolic and diastolic CHF, acute on chronic,  06/29/2012  . Hypo-osmolality and hyponatremia 06/29/2012  . Normocytic anemia 06/29/2012  . Atrial fibrillation, chronic ([place]) 06/29/2012  . Trigeminal neuralgia 06/29/2012  . Moderate to severe pulmonary hypertension, PA pressure in 70s June 2013 06/29/2012    Past Surgical History:  Procedure Laterality Date  . APPENDECTOMY    . BREAST SURGERY Right    "took out some nodes"  . CARDIAC CATHETERIZATION  07/04/2012   normal coronaries, EF 30-35%, elevated R & L heart pressures, reduced CO, mild pulm HTN (Dr. K. Mali Hilty)  .  CATARACT EXTRACTION W/ INTRAOCULAR LENS  IMPLANT, BILATERAL Bilateral   . DILATION AND CURETTAGE OF UTERUS    . LEFT AND RIGHT HEART CATHETERIZATION WITH CORONARY ANGIOGRAM N/A 07/04/2012   Procedure: LEFT AND RIGHT HEART CATHETERIZATION WITH CORONARY ANGIOGRAM;  Surgeon: Pixie Casino, MD;  [facility];  Service: Cardiovascular;  Laterality: N/A;  . NM MYOCAR PERF WALL MOTION  2011   lexiscan myoview - normal LV systolic function w/normal wall motion, no evidence of scar/ischemia  . SKIN CANCER EXCISION     "nose"  . TONSILLECTOMY AND  ADENOIDECTOMY    . TRANSTHORACIC ECHOCARDIOGRAM  2013   mild conc LVH, RV mildly dilated; LA severely dilated; RA mod dilated; mild-mod mitral annular calcif, calcif of anterior & posterior MV leaflet, mod MR; mod-severe TR with RSVP 30-49mmHg; mild calcif of AV leaflets and mod aortic regurg; mild pulm valve regurg; pericardial effusion (right)    OB History    No data available       Home Medications    Prior to Admission medications   Medication Sig Start Date End Date Taking? Authorizing Provider  acetaminophen (TYLENOL) 325 MG tablet Take 650 mg by mouth every 6 (six) hours as needed for moderate pain or fever.   Yes Historical Provider, MD  acetaminophen-codeine (TYLENOL #3) 300-30 MG tablet Take 1-2 tablets by mouth every 6 (six) hours as needed for moderate pain. 05/13/16  Yes Domenic Moras, PA-C  allopurinol (ZYLOPRIM) 100 MG tablet Take 200 mg by mouth daily. 05/19/16  Yes Historical Provider, MD  apixaban (ELIQUIS) 2.5 MG TABS tablet Take 1 tablet (2.5 mg total) by mouth 2 (two) times daily. 11/16/15  Yes Pixie Casino, MD  calcium-vitamin D (OSCAL WITH D) 500-200 MG-UNIT tablet Take 1 tablet by mouth daily with breakfast.   Yes Historical Provider, MD  carvedilol (COREG) 6.25 MG tablet Take 1 tablet (6.25 mg total) by mouth 2 (two) times daily with a meal. 03/23/16  Yes Pixie Casino, MD  ferrous sulfate 325 (65 FE) MG tablet Take 325 mg by mouth daily with breakfast.   Yes Historical Provider, MD  furosemide (LASIX) 80 MG tablet Take 1 tablet (80 mg total) by mouth daily. 11/10/15 09/21/18 Yes Luke K Kilroy, PA-C  gabapentin (NEURONTIN) 600 MG tablet Take 1 tablet (600 mg total) by mouth 3 (three) times daily. Please dose at 8am, 2pm and 8pm 01/19/16  Yes Marcial Pacas, MD  ipratropium-albuterol (DUONEB) 0.5-2.5 (3) MG/3ML SOLN Take 3 mLs by nebulization every 6 (six) hours as needed. Patient taking differently: Take 3 mLs by nebulization daily.  01/04/16  Yes Liberty Handy, MD  Melatonin  5 MG TABS Take 5 mg by mouth at bedtime.   Yes Historical Provider, MD  OXcarbazepine (TRILEPTAL) 150 MG tablet Take 0.5 tablet twice daily. Patient taking differently: Take 75 mg by mouth 2 (two) times daily.  06/19/16  Yes Dennie Bible, NP  potassium chloride SA (K-DUR,KLOR-CON) 20 MEQ tablet Take 20 mEq by mouth daily.   Yes Historical Provider, MD  simvastatin (ZOCOR) 10 MG tablet Take 0.5 tablets (5 mg total) by mouth daily at 6 PM. 11/24/15  Yes Pixie Casino, MD  traMADol (ULTRAM) 50 MG tablet Take 1 tablet (50 mg total) by mouth every 6 (six) hours as needed. Patient taking differently: Take 50 mg by mouth every 6 (six) hours as needed for moderate pain.  07/22/16  Yes Belkys A Regalado, MD  bisacodyl (DULCOLAX) 5 MG EC tablet Take  1 tablet (5 mg total) by mouth daily as needed for moderate constipation. Patient not taking: Reported on 08/20/2016 07/22/16   Belkys A Regalado, MD  cefpodoxime (VANTIN) 200 MG tablet Take 1 tablet (200 mg total) by mouth every 12 (twelve) hours. Patient not taking: Reported on 08/20/2016 07/22/16   Belkys A Regalado, MD  feeding supplement, ENSURE ENLIVE, (ENSURE ENLIVE) LIQD Take 237 mLs by mouth 2 (two) times daily between meals. Patient not taking: Reported on 08/20/2016 07/22/16   Belkys A Regalado, MD  Melatonin 10 MG TABS Take 10 mg by mouth at bedtime.     Historical Provider, MD  saccharomyces boulardii (FLORASTOR) 250 MG capsule Take 1 capsule (250 mg total) by mouth 2 (two) times daily. Patient not taking: Reported on 08/20/2016 07/22/16   Belkys A Regalado, MD  sulfamethoxazole-trimethoprim (BACTRIM DS,SEPTRA DS) 800-160 MG tablet Take 1 tablet by mouth 2 (two) times daily. 08/20/16 08/27/16  Tanna Furry, MD    Family History Family History  Problem Relation Age of Onset  . Stroke Father   . Heart attack Father   . Alzheimer's disease Mother   . Heart attack Brother   . Heart attack Brother     Social History Social History  Substance Use  Topics  . Smoking status: Never Smoker  . Smokeless tobacco: Never Used  . Alcohol use No     Allergies   Penicillins   Review of Systems Review of Systems  Constitutional: Positive for fatigue and fever. Negative for appetite change, chills and diaphoresis.  HENT: Positive for congestion. Negative for mouth sores, sore throat and trouble swallowing.   Eyes: Negative for visual disturbance.  Respiratory: Positive for cough and shortness of breath. Negative for chest tightness and wheezing.   Gastrointestinal: Positive for nausea and vomiting. Negative for abdominal distention, abdominal pain and diarrhea.  Endocrine: Negative for polydipsia, polyphagia and polyuria.  Genitourinary: Negative for decreased urine volume, dysuria, frequency and hematuria.  Musculoskeletal: Negative for gait problem.  Skin: Negative for color change, pallor and rash.  Neurological: Positive for weakness. Negative for dizziness, syncope, light-headedness and headaches.  Hematological: Does not bruise/bleed easily.  Psychiatric/Behavioral: Negative for behavioral problems and confusion.     Physical Exam Updated Vital Signs BP 134/71 (BP Location: Right Arm)   Pulse 71   Temp 98.9 F (37.2 C) (Oral)   Resp 16   Ht 5\' 1"  (1.549 m)   Wt 130 lb (59 kg)   SpO2 96%   BMI 24.56 kg/m   Physical Exam  Constitutional: She is oriented to person, place, and time. She appears well-developed and well-nourished. No distress.  HENT:  Head: Normocephalic.  Eyes: Conjunctivae are normal. Pupils are equal, round, and reactive to light. No scleral icterus.  Neck: Normal range of motion. Neck supple. No thyromegaly present.  Cardiovascular: Normal rate and regular rhythm.  Exam reveals no gallop and no friction rub.   No murmur heard. Pulmonary/Chest: Effort normal and breath sounds normal. No respiratory distress. She has no wheezes. She has no rales.  Diffuse rhonchi. Mild end for a wheeze. No increased work  of breathing. She is dyspneic with 3-4 words during conversation.  Abdominal: Soft. Bowel sounds are normal. She exhibits no distension. There is no tenderness. There is no rebound.  Musculoskeletal: Normal range of motion.  Neurological: She is alert and oriented to person, place, and time.  Skin: Skin is warm and dry. No rash noted.  No lower extremity edema  Psychiatric: She has  a normal mood and affect. Her behavior is normal.     ED Treatments / Results  Labs (all labs ordered are listed, but only abnormal results are displayed) Labs Reviewed  COMPREHENSIVE METABOLIC PANEL - Abnormal; Notable for the following:       Result Value   Sodium 132 (*)    Potassium 3.4 (*)    Chloride 93 (*)    Glucose, Bld 113 (*)    BUN 28 (*)    Creatinine, Ser 1.59 (*)    Albumin 3.2 (*)    ALT 9 (*)    GFR calc non Af Amer 29 (*)    GFR calc Af Amer 33 (*)    All other components within normal limits  CBC WITH DIFFERENTIAL/PLATELET - Abnormal; Notable for the following:    WBC 10.8 (*)    RBC 2.82 (*)    Hemoglobin 8.9 (*)    HCT 27.3 (*)    RDW 15.7 (*)    Monocytes Absolute 1.6 (*)    All other components within normal limits  URINALYSIS, ROUTINE W REFLEX MICROSCOPIC (NOT AT [facility]) - Abnormal; Notable for the following:    APPearance CLOUDY (*)    Hgb urine dipstick MODERATE (*)    Protein, ur 100 (*)    Leukocytes, UA LARGE (*)    All other components within normal limits  URINE MICROSCOPIC-ADD ON - Abnormal; Notable for the following:    Squamous Epithelial / LPF 0-5 (*)    Bacteria, UA FEW (*)    All other components within normal limits  CULTURE, BLOOD (ROUTINE X 2)  CULTURE, BLOOD (ROUTINE X 2)  URINE CULTURE  I-STAT CG4 LACTIC ACID, ED  I-STAT CG4 LACTIC ACID, ED    EKG  EKG Interpretation None       Radiology Dg Chest 2 View  Result Date: 08/20/2016 CLINICAL DATA:  Fever 103.6, confusion, low blood pressure, more tired than normal today, history atrial  fibrillation, COPD, heart disease/nonischemic cardiomyopathy, hypertension EXAM: CHEST  2 VIEW COMPARISON:  07/21/2016 FINDINGS: Enlargement of cardiac silhouette with vascular congestion. Atherosclerotic calcification aorta. Mediastinal contour stable. Lungs emphysematous but clear. No pleural effusion or pneumothorax. Bones demineralized. IMPRESSION: Enlargement of cardiac silhouette with pulmonary vascular congestion. COPD changes without acute infiltrate. Aortic atherosclerosis. Electronically Signed   By: Lavonia Dana M.D.   On: 08/20/2016 18:08    Procedures Procedures (including critical care time)  Medications Ordered in ED Medications  vancomycin (VANCOCIN) 500 mg in sodium chloride 0.9 % 100 mL IVPB (not administered)  ceFEPIme (MAXIPIME) 1 g in dextrose 5 % 50 mL IVPB (not administered)  acetaminophen (TYLENOL) tablet 1,000 mg (1,000 mg Oral Given 08/20/16 1635)  sodium chloride 0.9 % bolus 1,000 mL (0 mLs Intravenous Stopped 08/20/16 1828)  vancomycin (VANCOCIN) IVPB 1000 mg/200 mL premix (0 mg Intravenous Stopped 08/20/16 1747)  ceFEPIme (MAXIPIME) 2 g in dextrose 5 % 50 mL IVPB (0 g Intravenous Stopped 08/20/16 1734)     Initial Impression / Assessment and Plan / ED Course  I have reviewed the triage vital signs and the nursing notes.  Pertinent labs & imaging results that were available during my care of the patient were reviewed by me and considered in my medical decision making (see chart for details).  Clinical Course    On reexamination multiple times patient is awake alert conversant pleasant. His UTI. Remainder of her studies are reassuring. Her vitals are stable. I discussed inpatient admission versus home treatment with patient  and her family. They probably prefer outpatient treatment. I think this is reasonable. She is ambulatory here has no orthostasis or additional symptoms. Discharge prescription for Bactrim. Return here with any worsening symptoms including vomiting  confusion dizziness lightheaded weakness or other changes.  Final Clinical Impressions(s) / ED Diagnoses   Final diagnoses:  UTI (lower urinary tract infection)    New Prescriptions Discharge Medication List as of 08/20/2016  8:56 PM    START taking these medications   Details  sulfamethoxazole-trimethoprim (BACTRIM DS,SEPTRA DS) 800-160 MG tablet Take 1 tablet by mouth 2 (two) times daily., Starting Sun 08/20/2016, Until Sun 08/27/2016, Print         Tanna Furry, MD 08/20/16 2333

## 2016-08-20 NOTE — ED Notes (Signed)
Patient transported to X-ray 

## 2016-08-20 NOTE — Discharge Instructions (Signed)
Return to ER with any weakness, lightheadedness, vomiting, confusion, or other changes.

## 2016-08-20 NOTE — ED Notes (Signed)
Pt ambulated to bathroom w/minimal assistance  

## 2016-08-22 ENCOUNTER — Ambulatory Visit (INDEPENDENT_AMBULATORY_CARE_PROVIDER_SITE_OTHER): Payer: Medicare Other | Admitting: Internal Medicine

## 2016-08-22 ENCOUNTER — Encounter: Payer: Self-pay | Admitting: Internal Medicine

## 2016-08-22 VITALS — BP 110/56 | HR 84 | Ht 62.0 in | Wt 135.0 lb

## 2016-08-22 DIAGNOSIS — I482 Chronic atrial fibrillation, unspecified: Secondary | ICD-10-CM

## 2016-08-22 DIAGNOSIS — I447 Left bundle-branch block, unspecified: Secondary | ICD-10-CM | POA: Diagnosis not present

## 2016-08-22 DIAGNOSIS — I429 Cardiomyopathy, unspecified: Secondary | ICD-10-CM

## 2016-08-22 DIAGNOSIS — I428 Other cardiomyopathies: Secondary | ICD-10-CM

## 2016-08-22 LAB — URINE CULTURE

## 2016-08-22 NOTE — Progress Notes (Signed)
OFFICE NOTE  Chief Complaint:  Follow-up hospitalization  Primary Care Physician: Geoffery Lyons, MD  HPI:  Angela Franco is an 82 year old female who was recently hospitalized for hyponatremia and atrial fibrillation. She had a cardiac catheterization last summer that showed an EF of 30% to 35%; however, this improved to 40% to 45% with a repeat echocardiogram. She also has a chronic left bundle branch block. She has had problems with encephalopathy and was on Tegretol in the past. These symptoms have improved; however, still has persistent atrial fibrillation. In addition, during her hospitalization, it was complicated by anemia and was found to be iron deficient. She also had a GI workup, which did not reveal any bleeding source. She was taken off of warfarin, which she was taking at the time, but was never restarted. Overall, she seems much better and in fact is not complaining of nearly as many problems since her hospitalization. It is also important to remember that she had a trigeminal neuralgia as well that stopped.   At her last office visit I recommended starting Eliquis which she has been taking without any bleeding problems. She is maintaining atrial fibrillation today which is probably permanent A. fib at this point. She denies any worsening shortness of breath or chest pain.  I saw Angela Franco back in the office today. She seems to be doing fairly well. She denies any worsening shortness of breath, weight gain or swelling. She is tolerating Eliquis without any bleeding, occasions. She takes Lasix 3 times a week. She was recently told by her primary care provider that she does have some mild chronic kidney disease. This needs to be reassessed. She was also supposed to have an echocardiogram prior to this visit as her last EF was 40-45% in 2013.  Angela Franco returns today for follow-up. She is experiencing some increased shortness of breath and weight gain. Her weights up  about 7 pounds since her last office visit. She's noted some worsening leg swelling and even some swelling in her face. She's had an ongoing lower extremity wound problem which she is getting care at the wound care center. She is also dealing with trigeminal neuralgia which is quite painful. She reports some worsening shortness of breath and orthopnea. She is needed to use oxygen at night the last few evenings.  I saw Angela Franco today back in the office. Unfortunately she was hospitalized in January for influenza. She did have the vaccine although was sick for several weeks. She was actually hospitalized twice. She was treated with Tamiflu and then continued to worsen. Fortunately she is recovered. She actually lost about 25 pounds. Some of that may have been with increased diuresis as I increased her Lasix back in November, however her weight now is back up to 133 pounds from 125 pounds.  she says she feels much better finally. Blood pressure is well-controlled. She denies any worsening shortness of breath or lower extremity edema. She was maintained on Eliquis and is not having any bleeding problems.   Angela Franco returns today for follow-up. Unfortunately she was hospitalized again for recurrent pulmonary infection. This time she developed an RSV pneumonia, which is her second pneumonia in 3 months. Fortunately she responded to therapy and seems to be doing fairly well today. There was also mild heart failure exacerbation associated with it but she appears euvolemic and back to baseline today.  08/22/2016  Angela Franco was seen today in follow-up. Unfortunately she was again in the hospital recently  for UTI. She's been hospitalized numerous times over this past year mostly for recurrent lung infections and was ultimately found to have a likely lung cancer. From a cardiac standpoint her EF has improved significantly up to 55% on recent echo. She's had no new heart failure symptoms. Recently her nephrologist  took her off of lisinopril partially due to low blood pressure and elevated creatinine. Blood pressure appears to be normal today however was recently low when she presented with UTI. She is on antibiotics.   PMHx:  Past Medical History:  Diagnosis Date  . Acute facial pain   . Altered mental status 08/24/2012  . Anemia, with significant drop in H/H 08/24/2012  . Arthritis    "fingers" (02/28/2016)  . Atrial fibrillation ([place])   . Bursitis of hip   . CHF (congestive heart failure) ([place])   . Complication of anesthesia    "I'm slow to recover when I'm put to sleep"  . COPD (chronic obstructive pulmonary disease) ([place])    Never smoked. Questionable diagnosis.  [name] Dyslipidemia   . Heart disease   . Hypertension   . Hyponatremia   . Insomnia   . Kidney failure    Stage 3  . LBBB (left bundle branch block)   . Nonischemic cardiomyopathy ([place])   . On home oxygen therapy    "2L at night and prn during the day" (02/28/2016)  . Osteopenia   . Skin cancer of nose   . Trigeminal neuralgia   . UTI (lower urinary tract infection)     Past Surgical History:  Procedure Laterality Date  . APPENDECTOMY    . BREAST SURGERY Right    "took out some nodes"  . CARDIAC CATHETERIZATION  07/04/2012   normal coronaries, EF 30-35%, elevated R & L heart pressures, reduced CO, mild pulm HTN (Dr. K. Mali Novi Calia)  . CATARACT EXTRACTION W/ INTRAOCULAR LENS  IMPLANT, BILATERAL Bilateral   . DILATION AND CURETTAGE OF UTERUS    . LEFT AND RIGHT HEART CATHETERIZATION WITH CORONARY ANGIOGRAM N/A 07/04/2012   Procedure: LEFT AND RIGHT HEART CATHETERIZATION WITH CORONARY ANGIOGRAM;  Surgeon: Pixie Casino, MD;  Location: [facility] CATH LAB;  Service: Cardiovascular;  Laterality: N/A;  . NM MYOCAR PERF WALL MOTION  2011   lexiscan myoview - normal LV systolic function w/normal wall motion, no evidence of scar/ischemia  . SKIN CANCER EXCISION     "nose"  . TONSILLECTOMY AND ADENOIDECTOMY    . TRANSTHORACIC ECHOCARDIOGRAM   2013   mild conc LVH, RV mildly dilated; LA severely dilated; RA mod dilated; mild-mod mitral annular calcif, calcif of anterior & posterior MV leaflet, mod MR; mod-severe TR with RSVP 30-60mmHg; mild calcif of AV leaflets and mod aortic regurg; mild pulm valve regurg; pericardial effusion (right)    FAMHx:  Family History  Problem Relation Age of Onset  . Stroke Father   . Heart attack Father   . Alzheimer's disease Mother   . Heart attack Brother   . Heart attack Brother     SOCHx:   reports that she has never smoked. She has never used smokeless tobacco. She reports that she does not drink alcohol or use drugs.  ALLERGIES:  Allergies  Allergen Reactions  . Penicillins Hives    Has patient had a PCN reaction causing immediate rash, facial/tongue/throat swelling, SOB or lightheadedness with hypotension: No Has patient had a PCN reaction causing severe rash involving mucus membranes or skin necrosis: No Has patient had a PCN reaction that  required hospitalization No Has patient had a PCN reaction occurring within the last 10 years: No If all of the above answers are "NO", then may proceed with Cephalosporin use.    ROS: Pertinent items noted in HPI and remainder of comprehensive ROS otherwise negative.  HOME MEDS: Current Outpatient Prescriptions  Medication Sig Dispense Refill  . acetaminophen (TYLENOL) 325 MG tablet Take 650 mg by mouth every 6 (six) hours as needed for moderate pain or fever.    [name] acetaminophen-codeine (TYLENOL #3) 300-30 MG tablet Take 1-2 tablets by mouth every 6 (six) hours as needed for moderate pain. 15 tablet 0  . allopurinol (ZYLOPRIM) 100 MG tablet Take 200 mg by mouth daily.  5  . apixaban (ELIQUIS) 2.5 MG TABS tablet Take 1 tablet (2.5 mg total) by mouth 2 (two) times daily. 60 tablet 11  . bisacodyl (DULCOLAX) 5 MG EC tablet Take 1 tablet (5 mg total) by mouth daily as needed for moderate constipation. 30 tablet 0  . calcium-vitamin D (OSCAL WITH  D) 500-200 MG-UNIT tablet Take 1 tablet by mouth daily with breakfast.    . carvedilol (COREG) 6.25 MG tablet Take 1 tablet (6.25 mg total) by mouth 2 (two) times daily with a meal. 60 tablet 4  . cefpodoxime (VANTIN) 200 MG tablet Take 1 tablet (200 mg total) by mouth every 12 (twelve) hours. 6 tablet 0  . feeding supplement, ENSURE ENLIVE, (ENSURE ENLIVE) LIQD Take 237 mLs by mouth 2 (two) times daily between meals. 237 mL 12  . ferrous sulfate 325 (65 FE) MG tablet Take 325 mg by mouth daily with breakfast.    . furosemide (LASIX) 80 MG tablet Take 1 tablet (80 mg total) by mouth daily. 30 tablet 11  . gabapentin (NEURONTIN) 600 MG tablet Take 1 tablet (600 mg total) by mouth 3 (three) times daily. Please dose at 8am, 2pm and 8pm 270 tablet 3  . ipratropium-albuterol (DUONEB) 0.5-2.5 (3) MG/3ML SOLN Take 3 mLs by nebulization every 6 (six) hours as needed. (Patient taking differently: Take 3 mLs by nebulization daily. ) 360 mL 12  . Melatonin 10 MG TABS Take 10 mg by mouth at bedtime.     . Melatonin 5 MG TABS Take 5 mg by mouth at bedtime.    . OXcarbazepine (TRILEPTAL) 150 MG tablet Take 0.5 tablet twice daily. (Patient taking differently: Take 75 mg by mouth 2 (two) times daily. ) 30 tablet 8  . potassium chloride SA (K-DUR,KLOR-CON) 20 MEQ tablet Take 20 mEq by mouth daily.    [name] saccharomyces boulardii (FLORASTOR) 250 MG capsule Take 1 capsule (250 mg total) by mouth 2 (two) times daily. 20 capsule 0  . simvastatin (ZOCOR) 10 MG tablet Take 0.5 tablets (5 mg total) by mouth daily at 6 PM. 45 tablet 3  . sulfamethoxazole-trimethoprim (BACTRIM DS,SEPTRA DS) 800-160 MG tablet Take 1 tablet by mouth 2 (two) times daily. 14 tablet 0  . traMADol (ULTRAM) 50 MG tablet Take 1 tablet (50 mg total) by mouth every 6 (six) hours as needed. (Patient taking differently: Take 50 mg by mouth every 6 (six) hours as needed for moderate pain. ) 15 tablet 0   No current facility-administered medications for this  visit.     LABS/IMAGING: Results for orders placed or performed during the hospital encounter of 08/20/16 (from the past 48 hour(s))  I-Stat CG4 Lactic Acid, ED  (not at  [facility])     Status: None   Collection Time: 08/20/16  6:34  PM  Result Value Ref Range   Lactic Acid, Venous 1.30 0.5 - 1.9 mmol/L   Dg Chest 2 View  Result Date: 08/20/2016 CLINICAL DATA:  Fever 103.6, confusion, low blood pressure, more tired than normal today, history atrial fibrillation, COPD, heart disease/nonischemic cardiomyopathy, hypertension EXAM: CHEST  2 VIEW COMPARISON:  07/21/2016 FINDINGS: Enlargement of cardiac silhouette with vascular congestion. Atherosclerotic calcification aorta. Mediastinal contour stable. Lungs emphysematous but clear. No pleural effusion or pneumothorax. Bones demineralized. IMPRESSION: Enlargement of cardiac silhouette with pulmonary vascular congestion. COPD changes without acute infiltrate. Aortic atherosclerosis. Electronically Signed   By: Lavonia Dana M.D.   On: 08/20/2016 18:08    VITALS: BP (!) 110/56   Pulse 84   Ht 5\' 2"  (1.575 m)   Wt 135 lb (61.2 kg)   BMI 24.69 kg/m   EXAM: General appearance: alert and no distress Neck: no carotid bruit and no JVD Lungs: diminished breath sounds bilaterally and rales RLL Heart: irregularly irregular rhythm Abdomen: soft, non-tender; bowel sounds normal; no masses,  no organomegaly Extremities: edema trace bilaterally and venous stasis dermatitis noted Pulses: 2+ and symmetric Skin: Skin color, texture, turgor normal. No rashes or lesions Neurologic: Grossly normal Psych: Mood, affect normal  EKG: Deferred   ASSESSMENT: 1. Permanent atrial fibrillation - on Eliquis 2. Left bundle Branch block 3. Ischemic cardiomyopathy EF 40-45% (improved to 55% on recent echo) 4. Dyslipidemia 5. Acute on chronic systolic/diastolic congestive heart failure  6. CKD 4 7. COPD with recurrent viral pneumonia 8. Lung cancer  PLAN: 1.   Mrs.  Pavon seems to be stable on her current medications. EF is improved up to 55% however she is no longer on lisinopril due to worsening chronic kidney disease. She was recently dehydrated and found to have UTI for which she was treated. Unfortunate she was also found to have lung cancer and is due for another CT scan in October. Apparently is poorly differentiated and there are limited treatment options. I'm not recommending any medication changes at this time. Follow-up with me 6 months.   Pixie Casino, MD, [facility] Attending Cardiologist [place] C Debby Clyne 08/22/2016, 5:41 PM

## 2016-08-22 NOTE — Patient Instructions (Signed)
Medication Instructions:  Your physician recommends that you continue on your current medications as directed. Please refer to the Current Medication list given to you today.  Labwork: None ordered  Testing/Procedures: None Ordered   Follow-Up: Your physician wants you to follow-up in: 6 Months with Dr Debara Pickett. You will receive a reminder letter in the mail two months in advance. If you don't receive a letter, please call our office to schedule the follow-up appointment.   Any Other Special Instructions Will Be Listed Below (If Applicable).     If you need a refill on your cardiac medications before your next appointment, please call your pharmacy.

## 2016-08-23 ENCOUNTER — Telehealth (HOSPITAL_BASED_OUTPATIENT_CLINIC_OR_DEPARTMENT_OTHER): Payer: Self-pay | Admitting: Emergency Medicine

## 2016-08-23 NOTE — Telephone Encounter (Signed)
Post ED Visit - Positive Culture Follow-up  Culture report reviewed by antimicrobial stewardship pharmacist:  []  Elenor Quinones, Pharm.D. []  Heide Guile, Pharm.D., BCPS []  Parks Neptune, Pharm.D. []  Alycia Rossetti, Pharm.D., BCPS []  [place], Pharm.D., BCPS, AAHIVP []  Legrand Como, Pharm.D., BCPS, AAHIVP []  Milus Glazier, Pharm.D. []  Rob Evette Doffing, Pharm.Amedeo Gory PharmD  Positive culture Treated with bactrim DS, organism sensitive to the same and no further patient follow-up is required at this time.  Hazle Nordmann 08/23/2016, 9:42 AM

## 2016-08-25 LAB — CULTURE, BLOOD (ROUTINE X 2)
Culture: NO GROWTH
Culture: NO GROWTH

## 2016-09-13 ENCOUNTER — Ambulatory Visit: Payer: Medicare Other | Admitting: Internal Medicine

## 2016-09-22 ENCOUNTER — Ambulatory Visit (INDEPENDENT_AMBULATORY_CARE_PROVIDER_SITE_OTHER)
Admission: RE | Admit: 2016-09-22 | Discharge: 2016-09-22 | Disposition: A | Discharge status: Home / Self Care | Payer: Medicare Other | Source: Ambulatory Visit | Attending: Internal Medicine | Admitting: Internal Medicine

## 2016-09-22 DIAGNOSIS — R918 Other nonspecific abnormal finding of lung field: Secondary | ICD-10-CM

## 2016-09-25 ENCOUNTER — Telehealth: Payer: Self-pay | Admitting: Internal Medicine

## 2016-09-25 NOTE — Telephone Encounter (Signed)
Will discuss CT result 10/03/16   Ct Chest High Resolution  Result Date: 09/22/2016 CLINICAL DATA:  82 year old female with history of cough and recurrent pneumonia. Evaluate for interstitial lung disease. EXAM: CT CHEST WITHOUT CONTRAST TECHNIQUE: Multidetector CT imaging of the chest was performed following the standard protocol without intravenous contrast. High resolution imaging of the lungs, as well as inspiratory and expiratory imaging, was performed. COMPARISON:  High-resolution chest CT 04/14/2016. FINDINGS: Cardiovascular: Heart size is enlarged with right atrial dilatation. There is no significant pericardial fluid, thickening or pericardial calcification. There is aortic atherosclerosis, as well as atherosclerosis of the great vessels of the mediastinum and the coronary arteries, including calcified atherosclerotic plaque in the left anterior descending and left circumflex coronary arteries. Calcifications of the aortic valve and mitral annulus. Pulmonic trunk is mildly dilated measuring 3.6 cm in diameter. Mediastinum/Nodes: No pathologically enlarged mediastinal or hilar lymph nodes. Please note that accurate exclusion of hilar adenopathy is limited on noncontrast CT scans. Esophagus is unremarkable in appearance. Lungs/Pleura: High-resolution images demonstrates a remarkable mosaic pattern of attenuation throughout the lungs bilaterally with extensive areas of lucency interposed with areas of ground-glass attenuation. Throughout the lucent areas, the pulmonary arteries appear diminutive; imaging findings concerning for underlying chronic pulmonary thromboembolism. Inspiratory and expiratory imaging clearly demonstrates air trapping, which appears to correspond to the pre-existing areas of lucency, indicative of concurrent small airways disease. Expiratory images also demonstrate profound collapse of the trachea and mainstem bronchi, indicative of tracheobronchomalacia. There are no areas of  significant subpleural reticulation, parenchymal banding, traction bronchiectasis or frank honeycombing. Bilateral apical pleural parenchymal thickening, most compatible with chronic post infectious or inflammatory scarring is noted. Upper Abdomen: Retroperitoneal lymphadenopathy is again noted, with the largest lymph node measuring up to 18 mm in short axis in the left para-aortic nodal station adjacent to the left renal hilum, similar to prior study 05/05/2016. Aortic atherosclerosis. Musculoskeletal: There are no aggressive appearing lytic or blastic lesions noted in the visualized portions of the skeleton. IMPRESSION: 1. There is no evidence to suggest interstitial lung disease. However, there are imaging findings which are very concerning for chronic pulmonary thromboembolic disease, as discussed above. 2. In addition, there is evidence of extensive air trapping from small airways disease, and there is severe tracheobronchomalacia. 3. Cardiomegaly with severe dilatation of the right atrium. 4. Dilatation of the pulmonic trunk (3.6 cm in diameter), suggestive of pulmonary arterial hypertension. 5. Aortic atherosclerosis, in addition to 2 vessel coronary artery disease. 6. There are calcifications of the aortic valve and mitral annulus. Echocardiographic correlation for evaluation of potential valvular dysfunction may be warranted if clinically indicated. 7. Upper retroperitoneal lymphadenopathy similar to prior examination 07/18/2016 and 05/05/2016, previously biopsy proven to reflect metastatic disease from unknown adenocarcinoma. Electronically Signed   By: Vinnie Langton M.D.   On: 09/22/2016 15:21

## 2016-09-27 ENCOUNTER — Emergency Department (HOSPITAL_COMMUNITY): Payer: Medicare Other

## 2016-09-27 ENCOUNTER — Encounter (HOSPITAL_COMMUNITY): Payer: Self-pay | Admitting: *Deleted

## 2016-09-27 ENCOUNTER — Inpatient Hospital Stay (HOSPITAL_COMMUNITY): Payer: Medicare Other

## 2016-09-27 ENCOUNTER — Inpatient Hospital Stay (HOSPITAL_COMMUNITY)
Admission: EM | Admit: 2016-09-27 | Discharge: 2016-10-02 | DRG: 082 | Disposition: A | Discharge status: Hospice | Payer: Medicare Other | Attending: Internal Medicine | Admitting: Internal Medicine

## 2016-09-27 DIAGNOSIS — I447 Left bundle-branch block, unspecified: Secondary | ICD-10-CM | POA: Diagnosis present

## 2016-09-27 DIAGNOSIS — S0285XA Fracture of orbit, unspecified, initial encounter for closed fracture: Secondary | ICD-10-CM | POA: Diagnosis present

## 2016-09-27 DIAGNOSIS — Z79899 Other long term (current) drug therapy: Secondary | ICD-10-CM

## 2016-09-27 DIAGNOSIS — D631 Anemia in chronic kidney disease: Secondary | ICD-10-CM | POA: Diagnosis present

## 2016-09-27 DIAGNOSIS — I482 Chronic atrial fibrillation, unspecified: Secondary | ICD-10-CM | POA: Diagnosis present

## 2016-09-27 DIAGNOSIS — S065X9A Traumatic subdural hemorrhage with loss of consciousness of unspecified duration, initial encounter: Principal | ICD-10-CM | POA: Diagnosis present

## 2016-09-27 DIAGNOSIS — J449 Chronic obstructive pulmonary disease, unspecified: Secondary | ICD-10-CM | POA: Diagnosis present

## 2016-09-27 DIAGNOSIS — E871 Hypo-osmolality and hyponatremia: Secondary | ICD-10-CM | POA: Diagnosis present

## 2016-09-27 DIAGNOSIS — Z9841 Cataract extraction status, right eye: Secondary | ICD-10-CM

## 2016-09-27 DIAGNOSIS — G500 Trigeminal neuralgia: Secondary | ICD-10-CM | POA: Diagnosis present

## 2016-09-27 DIAGNOSIS — Z85828 Personal history of other malignant neoplasm of skin: Secondary | ICD-10-CM

## 2016-09-27 DIAGNOSIS — Y92129 Unspecified place in nursing home as the place of occurrence of the external cause: Secondary | ICD-10-CM | POA: Diagnosis not present

## 2016-09-27 DIAGNOSIS — Z9842 Cataract extraction status, left eye: Secondary | ICD-10-CM

## 2016-09-27 DIAGNOSIS — R252 Cramp and spasm: Secondary | ICD-10-CM | POA: Diagnosis not present

## 2016-09-27 DIAGNOSIS — I130 Hypertensive heart and chronic kidney disease with heart failure and stage 1 through stage 4 chronic kidney disease, or unspecified chronic kidney disease: Secondary | ICD-10-CM | POA: Diagnosis present

## 2016-09-27 DIAGNOSIS — S0231XA Fracture of orbital floor, right side, initial encounter for closed fracture: Secondary | ICD-10-CM | POA: Diagnosis present

## 2016-09-27 DIAGNOSIS — R52 Pain, unspecified: Secondary | ICD-10-CM

## 2016-09-27 DIAGNOSIS — W1830XA Fall on same level, unspecified, initial encounter: Secondary | ICD-10-CM | POA: Diagnosis present

## 2016-09-27 DIAGNOSIS — Z515 Encounter for palliative care: Secondary | ICD-10-CM

## 2016-09-27 DIAGNOSIS — R451 Restlessness and agitation: Secondary | ICD-10-CM | POA: Diagnosis present

## 2016-09-27 DIAGNOSIS — M6289 Other specified disorders of muscle: Secondary | ICD-10-CM | POA: Diagnosis not present

## 2016-09-27 DIAGNOSIS — R4701 Aphasia: Secondary | ICD-10-CM | POA: Diagnosis present

## 2016-09-27 DIAGNOSIS — I428 Other cardiomyopathies: Secondary | ICD-10-CM

## 2016-09-27 DIAGNOSIS — I5022 Chronic systolic (congestive) heart failure: Secondary | ICD-10-CM | POA: Diagnosis present

## 2016-09-27 DIAGNOSIS — W19XXXA Unspecified fall, initial encounter: Secondary | ICD-10-CM

## 2016-09-27 DIAGNOSIS — N179 Acute kidney failure, unspecified: Secondary | ICD-10-CM | POA: Diagnosis present

## 2016-09-27 DIAGNOSIS — Z66 Do not resuscitate: Secondary | ICD-10-CM | POA: Diagnosis present

## 2016-09-27 DIAGNOSIS — M62838 Other muscle spasm: Secondary | ICD-10-CM | POA: Diagnosis present

## 2016-09-27 DIAGNOSIS — Z7901 Long term (current) use of anticoagulants: Secondary | ICD-10-CM

## 2016-09-27 DIAGNOSIS — R29898 Other symptoms and signs involving the musculoskeletal system: Secondary | ICD-10-CM | POA: Diagnosis not present

## 2016-09-27 DIAGNOSIS — S0281XD Fracture of other specified skull and facial bones, right side, subsequent encounter for fracture with routine healing: Secondary | ICD-10-CM | POA: Diagnosis not present

## 2016-09-27 DIAGNOSIS — T501X5A Adverse effect of loop [high-ceiling] diuretics, initial encounter: Secondary | ICD-10-CM | POA: Diagnosis not present

## 2016-09-27 DIAGNOSIS — R51 Headache: Secondary | ICD-10-CM | POA: Diagnosis present

## 2016-09-27 DIAGNOSIS — I6200 Nontraumatic subdural hemorrhage, unspecified: Secondary | ICD-10-CM | POA: Diagnosis not present

## 2016-09-27 DIAGNOSIS — F419 Anxiety disorder, unspecified: Secondary | ICD-10-CM | POA: Diagnosis present

## 2016-09-27 DIAGNOSIS — N183 Chronic kidney disease, stage 3 unspecified: Secondary | ICD-10-CM | POA: Diagnosis present

## 2016-09-27 DIAGNOSIS — G4700 Insomnia, unspecified: Secondary | ICD-10-CM | POA: Diagnosis present

## 2016-09-27 DIAGNOSIS — E785 Hyperlipidemia, unspecified: Secondary | ICD-10-CM | POA: Diagnosis present

## 2016-09-27 DIAGNOSIS — G9340 Encephalopathy, unspecified: Secondary | ICD-10-CM | POA: Diagnosis not present

## 2016-09-27 DIAGNOSIS — Z9981 Dependence on supplemental oxygen: Secondary | ICD-10-CM | POA: Diagnosis not present

## 2016-09-27 DIAGNOSIS — S0083XA Contusion of other part of head, initial encounter: Secondary | ICD-10-CM

## 2016-09-27 DIAGNOSIS — I272 Pulmonary hypertension, unspecified: Secondary | ICD-10-CM | POA: Diagnosis present

## 2016-09-27 DIAGNOSIS — R531 Weakness: Secondary | ICD-10-CM | POA: Diagnosis present

## 2016-09-27 DIAGNOSIS — S0292XA Unspecified fracture of facial bones, initial encounter for closed fracture: Secondary | ICD-10-CM

## 2016-09-27 DIAGNOSIS — N2889 Other specified disorders of kidney and ureter: Secondary | ICD-10-CM | POA: Diagnosis present

## 2016-09-27 DIAGNOSIS — S065XAA Traumatic subdural hemorrhage with loss of consciousness status unknown, initial encounter: Secondary | ICD-10-CM | POA: Diagnosis present

## 2016-09-27 DIAGNOSIS — Z961 Presence of intraocular lens: Secondary | ICD-10-CM | POA: Diagnosis present

## 2016-09-27 LAB — TYPE AND SCREEN
ABO/RH(D): A POS
ANTIBODY SCREEN: NEGATIVE

## 2016-09-27 LAB — COMPREHENSIVE METABOLIC PANEL
ALK PHOS: 73 U/L (ref 38–126)
ALT: 13 U/L — AB (ref 14–54)
AST: 29 U/L (ref 15–41)
Albumin: 3.7 g/dL (ref 3.5–5.0)
Anion gap: 13 (ref 5–15)
BILIRUBIN TOTAL: 0.5 mg/dL (ref 0.3–1.2)
BUN: 32 mg/dL — AB (ref 6–20)
CALCIUM: 9.7 mg/dL (ref 8.9–10.3)
CO2: 26 mmol/L (ref 22–32)
CREATININE: 2.08 mg/dL — AB (ref 0.44–1.00)
Chloride: 89 mmol/L — ABNORMAL LOW (ref 101–111)
GFR, EST AFRICAN AMERICAN: 24 mL/min — AB (ref >60)
GFR, EST NON AFRICAN AMERICAN: 21 mL/min — AB (ref >60)
Glucose, Bld: 95 mg/dL (ref 65–99)
Potassium: 3.6 mmol/L (ref 3.5–5.1)
Sodium: 128 mmol/L — ABNORMAL LOW (ref 135–145)
Total Protein: 7.5 g/dL (ref 6.5–8.1)

## 2016-09-27 LAB — DIFFERENTIAL
BASOS ABS: 0 10*3/uL (ref 0.0–0.1)
Basophils Relative: 0 %
Eosinophils Absolute: 0.1 10*3/uL (ref 0.0–0.7)
Eosinophils Relative: 1 %
LYMPHS ABS: 2 10*3/uL (ref 0.7–4.0)
LYMPHS PCT: 22 %
MONO ABS: 1 10*3/uL (ref 0.1–1.0)
MONOS PCT: 11 %
NEUTROS ABS: 5.8 10*3/uL (ref 1.7–7.7)
Neutrophils Relative %: 66 %

## 2016-09-27 LAB — ETHANOL

## 2016-09-27 LAB — CBC
HEMATOCRIT: 26.5 % — AB (ref 36.0–46.0)
HEMOGLOBIN: 8.6 g/dL — AB (ref 12.0–15.0)
MCH: 31.2 pg (ref 26.0–34.0)
MCHC: 32.5 g/dL (ref 30.0–36.0)
MCV: 96 fL (ref 78.0–100.0)
Platelets: 230 10*3/uL (ref 150–400)
RBC: 2.76 MIL/uL — AB (ref 3.87–5.11)
RDW: 16.6 % — AB (ref 11.5–15.5)
WBC: 8.9 10*3/uL (ref 4.0–10.5)

## 2016-09-27 LAB — I-STAT CHEM 8, ED
BUN: 31 mg/dL — ABNORMAL HIGH (ref 6–20)
CALCIUM ION: 0.94 mmol/L — AB (ref 1.15–1.40)
CHLORIDE: 92 mmol/L — AB (ref 101–111)
CREATININE: 2.2 mg/dL — AB (ref 0.44–1.00)
GLUCOSE: 95 mg/dL (ref 65–99)
HCT: 27 % — ABNORMAL LOW (ref 36.0–46.0)
Hemoglobin: 9.2 g/dL — ABNORMAL LOW (ref 12.0–15.0)
Potassium: 3.5 mmol/L (ref 3.5–5.1)
Sodium: 127 mmol/L — ABNORMAL LOW (ref 135–145)
TCO2: 26 mmol/L (ref 0–100)

## 2016-09-27 LAB — CBG MONITORING, ED: GLUCOSE-CAPILLARY: 98 mg/dL (ref 65–99)

## 2016-09-27 LAB — I-STAT TROPONIN, ED: TROPONIN I, POC: 0 ng/mL (ref 0.00–0.08)

## 2016-09-27 LAB — APTT: aPTT: 32 seconds (ref 24–36)

## 2016-09-27 LAB — PROTIME-INR
INR: 1.34
Prothrombin Time: 16.7 seconds — ABNORMAL HIGH (ref 11.4–15.2)

## 2016-09-27 MED ORDER — POTASSIUM CHLORIDE CRYS ER 20 MEQ PO TBCR
20.0000 meq | EXTENDED_RELEASE_TABLET | Freq: Every day | ORAL | Status: DC
  Administered 2016-09-28 – 2016-09-29 (×2): 20 meq via ORAL
  Filled 2016-09-27 (×3): qty 1

## 2016-09-27 MED ORDER — CALCIUM CARBONATE-VITAMIN D 500-200 MG-UNIT PO TABS
1.0000 | ORAL_TABLET | Freq: Every day | ORAL | Status: DC
  Administered 2016-09-28 – 2016-09-29 (×2): 1 via ORAL
  Filled 2016-09-27 (×3): qty 1

## 2016-09-27 MED ORDER — GUAIFENESIN ER 600 MG PO TB12
600.0000 mg | ORAL_TABLET | Freq: Two times a day (BID) | ORAL | Status: DC
  Administered 2016-09-28 – 2016-09-29 (×5): 600 mg via ORAL
  Filled 2016-09-27 (×5): qty 1

## 2016-09-27 MED ORDER — MORPHINE SULFATE (PF) 4 MG/ML IV SOLN
1.0000 mg | INTRAVENOUS | Status: DC | PRN
  Administered 2016-09-27 – 2016-10-01 (×5): 1 mg via INTRAVENOUS
  Filled 2016-09-27 (×5): qty 1

## 2016-09-27 MED ORDER — ZOLPIDEM TARTRATE 5 MG PO TABS
5.0000 mg | ORAL_TABLET | Freq: Every day | ORAL | Status: DC
  Administered 2016-09-28 (×2): 5 mg via ORAL
  Filled 2016-09-27 (×3): qty 1

## 2016-09-27 MED ORDER — BISACODYL 5 MG PO TBEC
5.0000 mg | DELAYED_RELEASE_TABLET | Freq: Every day | ORAL | Status: DC | PRN
  Filled 2016-09-27: qty 1

## 2016-09-27 MED ORDER — ALLOPURINOL 100 MG PO TABS
200.0000 mg | ORAL_TABLET | Freq: Every day | ORAL | Status: DC
  Administered 2016-09-28 – 2016-09-29 (×2): 200 mg via ORAL
  Filled 2016-09-27 (×2): qty 2

## 2016-09-27 MED ORDER — SIMVASTATIN 5 MG PO TABS
5.0000 mg | ORAL_TABLET | Freq: Every day | ORAL | Status: DC
  Administered 2016-09-28 – 2016-09-29 (×2): 5 mg via ORAL
  Filled 2016-09-27 (×2): qty 1

## 2016-09-27 MED ORDER — CARVEDILOL 6.25 MG PO TABS
6.2500 mg | ORAL_TABLET | Freq: Two times a day (BID) | ORAL | Status: DC
Start: 2016-09-28 — End: 2016-10-01
  Administered 2016-09-28 – 2016-09-29 (×3): 6.25 mg via ORAL
  Filled 2016-09-27 (×5): qty 1

## 2016-09-27 MED ORDER — FUROSEMIDE 80 MG PO TABS: 80.0000 mg | ORAL_TABLET | Freq: Every day | ORAL | Status: DC

## 2016-09-27 MED ORDER — GABAPENTIN 600 MG PO TABS
600.0000 mg | ORAL_TABLET | Freq: Three times a day (TID) | ORAL | Status: DC
  Administered 2016-09-28 – 2016-09-29 (×7): 600 mg via ORAL
  Filled 2016-09-27 (×8): qty 1

## 2016-09-27 MED ORDER — FERROUS SULFATE 325 (65 FE) MG PO TABS
325.0000 mg | ORAL_TABLET | Freq: Every day | ORAL | Status: DC
  Administered 2016-09-28 – 2016-09-29 (×2): 325 mg via ORAL
  Filled 2016-09-27 (×3): qty 1

## 2016-09-27 MED ORDER — SENNOSIDES-DOCUSATE SODIUM 8.6-50 MG PO TABS
1.0000 | ORAL_TABLET | Freq: Two times a day (BID) | ORAL | Status: DC
  Administered 2016-09-28 – 2016-09-29 (×3): 1 via ORAL
  Filled 2016-09-27 (×5): qty 1

## 2016-09-27 MED ORDER — ONDANSETRON HCL 4 MG PO TABS: 4.0000 mg | ORAL_TABLET | Freq: Four times a day (QID) | ORAL | Status: DC | PRN

## 2016-09-27 MED ORDER — ONDANSETRON HCL 4 MG/2ML IJ SOLN: 4.0000 mg | Freq: Four times a day (QID) | INTRAMUSCULAR | Status: DC | PRN

## 2016-09-27 MED ORDER — ONDANSETRON HCL 4 MG/2ML IJ SOLN
4.0000 mg | Freq: Once | INTRAMUSCULAR | Status: AC
  Administered 2016-09-27: 4 mg via INTRAVENOUS
  Filled 2016-09-27: qty 2

## 2016-09-27 MED ORDER — ACETAMINOPHEN 325 MG PO TABS: 650.0000 mg | ORAL_TABLET | Freq: Four times a day (QID) | ORAL | Status: DC | PRN

## 2016-09-27 MED ORDER — IPRATROPIUM-ALBUTEROL 0.5-2.5 (3) MG/3ML IN SOLN: 3.0000 mL | Freq: Four times a day (QID) | RESPIRATORY_TRACT | Status: DC

## 2016-09-27 MED ORDER — FENTANYL CITRATE (PF) 100 MCG/2ML IJ SOLN
50.0000 ug | Freq: Once | INTRAMUSCULAR | Status: AC
  Administered 2016-09-27: 50 ug via INTRAVENOUS
  Filled 2016-09-27: qty 2

## 2016-09-27 MED ORDER — OXCARBAZEPINE 150 MG PO TABS
75.0000 mg | ORAL_TABLET | Freq: Two times a day (BID) | ORAL | Status: DC
  Administered 2016-09-28 – 2016-09-29 (×5): 75 mg via ORAL
  Filled 2016-09-27 (×8): qty 0.5

## 2016-09-27 MED ORDER — ACETAMINOPHEN 650 MG RE SUPP: 650.0000 mg | Freq: Four times a day (QID) | RECTAL | Status: DC | PRN

## 2016-09-27 NOTE — ED Notes (Signed)
Urine emptied by mistake when the pt voided in bedpan

## 2016-09-27 NOTE — ED Notes (Signed)
Pain med given 

## 2016-09-27 NOTE — ED Notes (Signed)
The pt reports that her headache is gone now but her lower back still hurts.  She had a biopsy in feb and she thinks they are looking for cancer there.  Pt alert and oriented  Son remains at the bedside.   Ice pack to the rt side of her face .  Her swelling has decreased perhaps from the ice

## 2016-09-27 NOTE — ED Provider Notes (Signed)
[facility] DEPT Provider Note   CSN: [id number] Arrival date & time: 09/27/16  1801     History   Chief Complaint Chief Complaint  Patient presents with  . Code Stroke    HPI Angela Franco is a 82 y.o. female with a hx of COPD, CHF, atrial fibrillation, on eliquis, who follows with palliative care presents to the Ed as a code stroke from her nursing facility after she fell while standing up from her chair. She fell and struck her face on the floor sustaining obvious trauma and significant bruising and swelling to her face. She presented initially as a code stroke by EMS as she was thought to have some left sided weakness initially on assessment. On arrival the patient has airway intact, GCS 15, complaining of facial pain and tenderness along the right side of her chest. She was seen on arrival with neurology consultant.  HPI  Past Medical History:  Diagnosis Date  . Acute facial pain   . Altered mental status 08/24/2012  . Anemia, with significant drop in H/H 08/24/2012  . Arthritis    "fingers" (02/28/2016)  . Atrial fibrillation ([place])   . Bursitis of hip   . CHF (congestive heart failure) ([place])   . Complication of anesthesia    "I'm slow to recover when I'm put to sleep"  . COPD (chronic obstructive pulmonary disease) ([place])    Never smoked. Questionable diagnosis.  [name] Dyslipidemia   . Heart disease   . Hypertension   . Hyponatremia   . Insomnia   . Kidney failure    Stage 3  . LBBB (left bundle branch block)   . Nonischemic cardiomyopathy ([place])   . On home oxygen therapy    "2L at night and prn during the day" (02/28/2016)  . Osteopenia   . Skin cancer of nose   . Trigeminal neuralgia   . UTI (lower urinary tract infection)     Patient Active Problem List   Diagnosis Date Noted  . Palliative care patient 09/27/2016  . Subdural hematoma ([place]) 09/27/2016  . Right orbital fracture ([place]) 09/27/2016  . Sepsis ([place]) 07/19/2016  . Aspiration pneumonia ([place])  07/19/2016  . Fever 07/19/2016  . Leukocytosis 07/19/2016  . Acute respiratory failure with hypoxia ([place]) 02/28/2016  . Acute renal failure superimposed on stage 3 chronic kidney disease ([place]) 02/28/2016  . Acute on chronic diastolic heart failure ([place])   . Acute on chronic systolic heart failure ([place])   . Respiratory syncytial virus (RSV) infection   . Chronic systolic heart failure ([place])   . COPD exacerbation ([place]) 01/21/2016  . Chronic renal impairment, stage 3 (moderate) 11/10/2015  . COPD by CXR 08/26/2012  . Non-ischemic cardiomyopathy EF improved from 30-35% to 40-45% by Echo in 07/2012 06/30/2012  . LBBB (left bundle branch block) 06/30/2012  . Chronic anticoagulation 06/30/2012  . HTN (hypertension) 06/30/2012  . Systolic and diastolic CHF, acute on chronic,  06/29/2012  . Hypo-osmolality and hyponatremia 06/29/2012  . Normocytic anemia 06/29/2012  . Atrial fibrillation, chronic ([place]) 06/29/2012  . Trigeminal neuralgia 06/29/2012  . Moderate to severe pulmonary hypertension, PA pressure in 70s June 2013 06/29/2012    Past Surgical History:  Procedure Laterality Date  . APPENDECTOMY    . BREAST SURGERY Right    "took out some nodes"  . CARDIAC CATHETERIZATION  07/04/2012   normal coronaries, EF 30-35%, elevated R & L heart pressures, reduced CO, mild pulm HTN (Dr. K. Mali Hilty)  . CATARACT EXTRACTION W/  INTRAOCULAR LENS  IMPLANT, BILATERAL Bilateral   . DILATION AND CURETTAGE OF UTERUS    . LEFT AND RIGHT HEART CATHETERIZATION WITH CORONARY ANGIOGRAM N/A 07/04/2012   Procedure: LEFT AND RIGHT HEART CATHETERIZATION WITH CORONARY ANGIOGRAM;  Surgeon: Pixie Casino, MD;  Location: [facility] CATH LAB;  Service: Cardiovascular;  Laterality: N/A;  . NM MYOCAR PERF WALL MOTION  2011   lexiscan myoview - normal LV systolic function w/normal wall motion, no evidence of scar/ischemia  . SKIN CANCER EXCISION     "nose"  . TONSILLECTOMY AND ADENOIDECTOMY    . TRANSTHORACIC ECHOCARDIOGRAM   2013   mild conc LVH, RV mildly dilated; LA severely dilated; RA mod dilated; mild-mod mitral annular calcif, calcif of anterior & posterior MV leaflet, mod MR; mod-severe TR with RSVP 30-67mmHg; mild calcif of AV leaflets and mod aortic regurg; mild pulm valve regurg; pericardial effusion (right)    OB History    No data available       Home Medications    Prior to Admission medications   Medication Sig Start Date End Date Taking? Authorizing Provider  acetaminophen (TYLENOL) 325 MG tablet Take 650 mg by mouth every 8 (eight) hours as needed for fever (or pain). CANNOT EXCEED A COMBINED TOTAL OF 3,000 MILLIGRAMS/24 HOURS FROM ALL SOURCES OF TYLENOL   Yes Historical Provider, MD  acetaminophen-codeine (TYLENOL #3) 300-30 MG tablet Take 1-2 tablets by mouth every 6 (six) hours as needed for moderate pain. Patient taking differently: Take 1 tablet by mouth every 6 (six) hours as needed for moderate pain. CANNOT EXCEED A COMBINED TOTAL OF 3,000 MILLIGRAMS/24 HOURS FROM ALL SOURCES OF TYLENOL 05/13/16  Yes Domenic Moras, PA-C  allopurinol (ZYLOPRIM) 100 MG tablet Take 200 mg by mouth daily. 05/19/16  Yes Historical Provider, MD  apixaban (ELIQUIS) 2.5 MG TABS tablet Take 1 tablet (2.5 mg total) by mouth 2 (two) times daily. 11/16/15  Yes Pixie Casino, MD  bisacodyl (DULCOLAX) 5 MG EC tablet Take 1 tablet (5 mg total) by mouth daily as needed for moderate constipation. 07/22/16  Yes Belkys A Regalado, MD  calcium-vitamin D (OSCAL WITH D) 500-200 MG-UNIT tablet Take 1 tablet by mouth daily with breakfast.   Yes Historical Provider, MD  carvedilol (COREG) 6.25 MG tablet Take 1 tablet (6.25 mg total) by mouth 2 (two) times daily with a meal. 03/23/16  Yes Pixie Casino, MD  ferrous sulfate 325 (65 FE) MG tablet Take 325 mg by mouth daily with breakfast.   Yes Historical Provider, MD  furosemide (LASIX) 80 MG tablet Take 1 tablet (80 mg total) by mouth daily. 11/10/15 09/21/18 Yes Luke K Kilroy, PA-C    gabapentin (NEURONTIN) 600 MG tablet Take 1 tablet (600 mg total) by mouth 3 (three) times daily. Please dose at 8am, 2pm and 8pm Patient taking differently: Take 600 mg by mouth 3 (three) times daily. 0800, 1400, 2000 01/19/16  Yes Marcial Pacas, MD  guaiFENesin (MUCINEX) 600 MG 12 hr tablet Take 600 mg by mouth 2 (two) times daily.   Yes Historical Provider, MD  ipratropium-albuterol (DUONEB) 0.5-2.5 (3) MG/3ML SOLN Take 3 mLs by nebulization every 6 (six) hours as needed. Patient taking differently: Take 3 mLs by nebulization 4 (four) times daily.  01/04/16  Yes Liberty Handy, MD  Melatonin 5 MG TABS Take 5 mg by mouth at bedtime.   Yes Historical Provider, MD  OXcarbazepine (TRILEPTAL) 150 MG tablet Take 0.5 tablet twice daily. Patient taking differently: Take 75 mg by  mouth 2 (two) times daily.  06/19/16  Yes Dennie Bible, NP  oxyCODONE (OXY IR/ROXICODONE) 5 MG immediate release tablet Take 5 mg by mouth See admin instructions. ONE HOUR PRIOR TO DENTAL APPOINTMENT (WHEN SCHEDULED)   Yes Historical Provider, MD  potassium chloride SA (K-DUR,KLOR-CON) 20 MEQ tablet Take 20 mEq by mouth daily.   Yes Historical Provider, MD  sennosides-docusate sodium (SENOKOT-S) 8.6-50 MG tablet Take 1 tablet by mouth 2 (two) times daily.   Yes Historical Provider, MD  simvastatin (ZOCOR) 10 MG tablet Take 0.5 tablets (5 mg total) by mouth daily at 6 PM. 11/24/15  Yes Pixie Casino, MD  traMADol (ULTRAM) 50 MG tablet Take 1 tablet (50 mg total) by mouth every 6 (six) hours as needed. Patient taking differently: Take 50 mg by mouth every 4 (four) hours as needed (for pain).  07/22/16  Yes Belkys A Regalado, MD  zolpidem (AMBIEN) 5 MG tablet Take 5 mg by mouth at bedtime.   Yes Historical Provider, MD  cefpodoxime (VANTIN) 200 MG tablet Take 1 tablet (200 mg total) by mouth every 12 (twelve) hours. Patient not taking: Reported on 09/27/2016 07/22/16   Belkys A Regalado, MD  feeding supplement, ENSURE ENLIVE,  (ENSURE ENLIVE) LIQD Take 237 mLs by mouth 2 (two) times daily between meals. Patient not taking: Reported on 09/27/2016 07/22/16   Belkys A Regalado, MD  saccharomyces boulardii (FLORASTOR) 250 MG capsule Take 1 capsule (250 mg total) by mouth 2 (two) times daily. Patient not taking: Reported on 09/27/2016 07/22/16   Elmarie Shiley, MD    Family History Family History  Problem Relation Age of Onset  . Stroke Father   . Heart attack Father   . Alzheimer's disease Mother   . Heart attack Brother   . Heart attack Brother     Social History Social History  Substance Use Topics  . Smoking status: Never Smoker  . Smokeless tobacco: Never Used  . Alcohol use No     Allergies   Other; Tape; and Penicillins   Review of Systems Review of Systems  Constitutional: Positive for activity change. Negative for chills and fever.  HENT: Positive for facial swelling and nosebleeds (after her fall, now hemostatic). Negative for hearing loss, sinus pressure, sneezing and sore throat.   Eyes: Positive for redness. Negative for photophobia, pain and visual disturbance.  Respiratory: Negative for cough, chest tightness and shortness of breath.   Cardiovascular: Positive for chest pain (right sided chest tenderness) and palpitations.  Gastrointestinal: Negative for abdominal pain, nausea and vomiting.  Musculoskeletal: Negative for back pain and neck pain.  Neurological: Positive for syncope, facial asymmetry and headaches. Negative for seizures, weakness, light-headedness and numbness.  All other systems reviewed and are negative.    Physical Exam Updated Vital Signs BP (!) 112/54 (BP Location: Right Arm)   Pulse 91   Temp 98 F (36.7 C) (Oral)   Resp 17   Ht 5\' 2"  (1.575 m)   Wt 61.1 kg   SpO2 97%   BMI 24.64 kg/m   Physical Exam  Constitutional: She is oriented to person, place, and time. She appears well-developed and well-nourished. No distress.  HENT:  Head: Normocephalic.  Head is with contusion. Head is without laceration.    Mouth/Throat: Oropharynx is clear and moist.  Right eye swollen shut. Nose with evidence of prior epistaxis, now hemostatic No hemotympanum  Eyes: EOM are normal. Pupils are equal, round, and reactive to light. Right conjunctiva has a  hemorrhage.  Neck: Normal range of motion. Neck supple.  Cardiovascular: Normal rate and intact distal pulses.  An irregularly irregular rhythm present.  Murmur heard. Pulmonary/Chest: Effort normal and breath sounds normal. She exhibits tenderness. She exhibits no crepitus and no deformity.    Abdominal: Soft. She exhibits no distension. There is no tenderness.  Musculoskeletal: She exhibits no edema, tenderness or deformity.  No C/T/L spine tenderness or step offs.  Neurological: She is alert and oriented to person, place, and time. She has normal strength. No cranial nerve deficit or sensory deficit. Coordination normal. GCS eye subscore is 4. GCS verbal subscore is 5. GCS motor subscore is 6.  No focal deficits noted, no dysmetria on finger to nose or heel to shin. Gait deferred due to fall  Skin: Skin is warm and dry. She is not diaphoretic.  Nursing note and vitals reviewed.    ED Treatments / Results  Labs (all labs ordered are listed, but only abnormal results are displayed) Labs Reviewed  PROTIME-INR - Abnormal; Notable for the following:       Result Value   Prothrombin Time 16.7 (*)    All other components within normal limits  CBC - Abnormal; Notable for the following:    RBC 2.76 (*)    Hemoglobin 8.6 (*)    HCT 26.5 (*)    RDW 16.6 (*)    All other components within normal limits  COMPREHENSIVE METABOLIC PANEL - Abnormal; Notable for the following:    Sodium 128 (*)    Chloride 89 (*)    BUN 32 (*)    Creatinine, Ser 2.08 (*)    ALT 13 (*)    GFR calc non Af Amer 21 (*)    GFR calc Af Amer 24 (*)    All other components within normal limits  RAPID URINE DRUG SCREEN, HOSP  PERFORMED - Abnormal; Notable for the following:    Opiates POSITIVE (*)    All other components within normal limits  URINALYSIS, ROUTINE W REFLEX MICROSCOPIC (NOT AT [facility]) - Abnormal; Notable for the following:    Leukocytes, UA TRACE (*)    All other components within normal limits  BASIC METABOLIC PANEL - Abnormal; Notable for the following:    Sodium 132 (*)    Chloride 91 (*)    BUN 26 (*)    Creatinine, Ser 1.79 (*)    GFR calc non Af Amer 25 (*)    GFR calc Af Amer 29 (*)    All other components within normal limits  CBC - Abnormal; Notable for the following:    RBC 2.53 (*)    Hemoglobin 8.1 (*)    HCT 24.3 (*)    RDW 16.6 (*)    All other components within normal limits  URINE MICROSCOPIC-ADD ON - Abnormal; Notable for the following:    Squamous Epithelial / LPF 6-30 (*)    Bacteria, UA FEW (*)    All other components within normal limits  I-STAT CHEM 8, ED - Abnormal; Notable for the following:    Sodium 127 (*)    Chloride 92 (*)    BUN 31 (*)    Creatinine, Ser 2.20 (*)    Calcium, Ion 0.94 (*)    Hemoglobin 9.2 (*)    HCT 27.0 (*)    All other components within normal limits  ETHANOL  APTT  DIFFERENTIAL  CBG MONITORING, ED  I-STAT TROPOININ, ED  TYPE AND SCREEN    EKG  EKG  Interpretation None       Radiology Dg Chest 1 View  Result Date: 09/27/2016 CLINICAL DATA:  Fall today with right-sided chest pain, initial encounter EXAM: CHEST 1 VIEW COMPARISON:  09/22/2016 FINDINGS: Cardiac shadow remains enlarged. The lungs are well aerated bilaterally without focal infiltrate or sizable effusion. No acute bony abnormality is seen. IMPRESSION: No acute abnormality noted. Electronically Signed   By: Inez Catalina M.D.   On: 09/27/2016 21:30   Ct Head Wo Contrast  Result Date: 09/28/2016 CLINICAL DATA:  82 year old female follow-up subdural hematoma Subsequent encounter. EXAM: CT HEAD WITHOUT CONTRAST TECHNIQUE: Contiguous axial images were obtained from the  base of the skull through the vertex without intravenous contrast. COMPARISON:  09/27/2016. FINDINGS: Brain: Large right parafalcine subdural hematoma with extension along the right tentorium similar to prior exam. Maximal thickness 1.6 cm. Tiny anterior left parafalcine without change. Artifact inferior temporal lobes bilaterally suspected. No CT evidence of large acute infarct. Chronic microvascular changes. Mild global atrophy without hydrocephalus. Vascular: Vascular calcifications. Skull: No skull fracture. Sinuses/Orbits: Right orbital floor and lamina papyracea fracture. Pre orbital hematoma. Opacification right maxillary sinus. Other: Negative IMPRESSION: Large right parafalcine subdural hematoma with extension along the right tentorium similar to prior exam. Maximal thickness 1.6 cm. Tiny anterior left parafalcine without change. Right orbital floor and lamina papyracea fracture. Pre orbital hematoma. Opacification right maxillary sinus. Electronically Signed   By: Genia Del M.D.   On: 09/28/2016 07:59   Ct Cervical Spine Wo Contrast  Result Date: 09/27/2016 CLINICAL DATA:  Patient fell at around 11 a.m. this morning with numbness in her left leg. EXAM: CT CERVICAL SPINE WITHOUT CONTRAST TECHNIQUE: Multidetector CT imaging of the cervical spine was performed without intravenous contrast. Multiplanar CT image reconstructions were also generated. COMPARISON:  None. FINDINGS: Alignment: Normal. Skull base and vertebrae: Intact. Craniocervical relationship is maintained. Soft tissues and spinal canal: No prevertebral soft tissue swelling. No significant canal stenosis. Low moderate right C3-4, C5-6 and mild C6-7 neural foraminal encroachment osteophytes. Mild left C3-4 and C5-6 neural foraminal narrowing from osteophytes. Disc levels: Degenerative disc space narrowing at C5-6 and C6-7 with small posterior marginal osteophytes. Bilateral uncovertebral osteoarthritis C3-4, C4-5, C5-6 and right C6-7.  Upper chest: Pleural-parenchymal scarring bilaterally. No dominant mass seen. Other: Mastoids are aerated bilaterally. Bilateral extracranial carotid arteriosclerosis. IMPRESSION: Multilevel degenerative facet arthropathy. Degenerative disc disease C5-6 and C6-7. No acute cervical spine osseous abnormality. Electronically Signed   By: Ashley Royalty M.D.   On: 09/27/2016 18:41   Ct Head Code Stroke W/o Cm  Result Date: 09/27/2016 CLINICAL DATA:  Code stroke.  Flaccid left leg. EXAM: CT HEAD WITHOUT CONTRAST TECHNIQUE: Contiguous axial images were obtained from the base of the skull through the vertex without intravenous contrast. COMPARISON:  Prior MRI from 08/25/2012. FINDINGS: Brain: Acute right parafalcine subdural hematoma with extension along the tentorium. Hematoma measures up to 16 mm in greatest diameter. No associated mass effect. And spinal 9 knee discal about its off each see slow as of distance for/with from the [place] IIA well bone left neck I do not see anything else lobe status stroke from on 5/air off palm of the cisterns supper all by her palm entity down eyelids most extensive in facial fractures of small a a a a No other acute intracranial hemorrhage. Generalized cerebral atrophy with mild chronic microvascular ischemic disease. No other evidence for acute large vessel territory infarct. Small remote left cerebellar infarct noted. No mass lesion, midline shift, or mass effect.  No hydrocephalus. Vascular: No hyperdense vessel. Scattered vascular calcifications present within the carotid siphons. Skull: Right periorbital and facial contusion. Scalp soft tissues otherwise unremarkable. There is associated right orbital floor fracture. Probable extension into the right lamina papyracea. Calvarium otherwise intact. Sinuses/Orbits: Right periorbital contusion. Globes grossly intact. There is associated right orbital floor fracture. Blood within the right maxillary sinus. No acute injury about the left  globe and orbit. Mastoid air cells clear. ASPECTS [facility] Stroke Program Early CT Score) - Ganglionic level infarction (caudate, lentiform nuclei, internal capsule, insula, M1-M3 cortex): 7 - Supraganglionic infarction (M4-M6 cortex): 3 Total score (0-10 with 10 being normal): 10 IMPRESSION: 1. Acute right parafalcine subdural hematoma with extension along the right tentorium. No significant mass effect. 2. No other evidence for acute intracranial infarct or other process identified. 3. ASPECTS is 10 4. Right periorbital contusion with associated right orbital fractures. Further evaluation with dedicated maxillofacial CT recommended. 5. Mild atrophy with chronic microvascular ischemic disease. Small remote left cerebellar infarct. Critical Value/emergent results were called by telephone at the time of interpretation on 09/27/2016 at 6:31 pm to Dr. Shon Hale , who verbally acknowledged these results. Electronically Signed   By: Jeannine Boga M.D.   On: 09/27/2016 18:34   Ct Maxillofacial Wo Contrast  Result Date: 09/27/2016 CLINICAL DATA:  Initial evaluation for acute trauma, fall. EXAM: CT MAXILLOFACIAL WITHOUT CONTRAST TECHNIQUE: Multidetector CT imaging of the maxillofacial structures was performed. Multiplanar CT image reconstructions were also generated. A small metallic BB was placed on the right temple in order to reliably differentiate right from left. COMPARISON:  Prior head CT from earlier the same day. FINDINGS: Osseous: Zygomatic arches intact. Possible the faint acute nondisplaced fracture through the posterior wall of the right maxillary sinus. No other acute maxillary fracture. Pterygoid plates intact. Nasal bones intact. Nasal septum midline and intact. Mandible intact. Mandibular condyles normally situated. No acute abnormality about the dentition. Orbits: There is an acute trapped nor tight right orbital floor fracture. Fracture is at the level of the right infraorbital foramen.  Approximately 4 mm of superior displayed placement of the fracture fragment into the bony right orbit. Adjacent soft tissue stranding with few foci of gas. Right inferior rectus muscle remains normally positioned. Subtle associated fracture of the inferior right lamina for pre shift. Right orbital roof and lateral wall of the bony right orbit intact. Right globe intact. No frank retro-orbital hematoma. No acute osseous abnormality about the left orbit. Left globe intact without retro-orbital pathology. Sinuses: Right maxillary sinus is essentially completely opacified with blood and fluid within the lumen. Mild scattered mucosal thickening within the ethmoidal air cells. Paranasal sinuses are otherwise clear. No mastoid effusion. Soft tissues: Prominent right periorbital and facial contusion. Limited intracranial: Right-sided subdural hematoma, better evaluated on prior head CT. IMPRESSION: 1. Acute right orbital floor fracture at the level of the right infraorbital foramen. 4 mm of superior displacement of the main fracture fragment which closely approximates the right inferior rectus muscle. Intact globes without frank retro-orbital hematoma. 2. Associated subtle acute fracture of the right lamina papyracea. Orbital floor fracture also likely extends through the posterior wall of the right maxillary sinus as well. 3. Right periorbital and facial contusion. 4. Hemo sinus involving the right maxillary sinus. 5. Acute right-sided subdural hematoma, better evaluated on prior head CT. Electronically Signed   By: Jeannine Boga M.D.   On: 09/27/2016 20:15    Procedures Procedures (including critical care time)  Medications Ordered in ED Medications  morphine 4 MG/ML injection 1 mg (1 mg Intravenous Given 09/27/16 2218)  guaiFENesin (MUCINEX) 12 hr tablet 600 mg (600 mg Oral Given 09/28/16 1019)  senna-docusate (Senokot-S) tablet 1 tablet (1 tablet Oral Given 09/28/16 1019)  zolpidem (AMBIEN) tablet 5 mg  (5 mg Oral Given 09/28/16 0102)  bisacodyl (DULCOLAX) EC tablet 5 mg (not administered)  OXcarbazepine (TRILEPTAL) tablet 75 mg (75 mg Oral Given 09/28/16 1023)  allopurinol (ZYLOPRIM) tablet 200 mg (200 mg Oral Given 09/28/16 1018)  carvedilol (COREG) tablet 6.25 mg (6.25 mg Oral Given 09/28/16 0857)  gabapentin (NEURONTIN) tablet 600 mg (600 mg Oral Given 09/28/16 0857)  simvastatin (ZOCOR) tablet 5 mg (not administered)  potassium chloride SA (K-DUR,KLOR-CON) CR tablet 20 mEq (20 mEq Oral Given 09/28/16 1023)  calcium-vitamin D (OSCAL WITH D) 500-200 MG-UNIT per tablet 1 tablet (1 tablet Oral Given 09/28/16 0857)  ferrous sulfate tablet 325 mg (325 mg Oral Given 09/28/16 0857)  acetaminophen (TYLENOL) tablet 650 mg (not administered)    Or  acetaminophen (TYLENOL) suppository 650 mg (not administered)  ondansetron (ZOFRAN) tablet 4 mg (not administered)    Or  ondansetron (ZOFRAN) injection 4 mg (not administered)  ipratropium-albuterol (DUONEB) 0.5-2.5 (3) MG/3ML nebulizer solution 3 mL (not administered)  fentaNYL (SUBLIMAZE) injection 50 mcg (50 mcg Intravenous Given 09/27/16 1950)  ondansetron (ZOFRAN) injection 4 mg (4 mg Intravenous Given 09/27/16 2218)     Initial Impression / Assessment and Plan / ED Course  I have reviewed the triage vital signs and the nursing notes.  Pertinent labs & imaging results that were available during my care of the patient were reviewed by me and considered in my medical decision making (see chart for details).  Clinical Course   82 y.o. female presents as a code stroke after a fall. Exam as above. Code stroke CT head found evidence of right sided subdural hematoma with no mass effect. Labs were drawn and returned significant for Na 128, Cr 2.08, BUN 32, neg troponin, Hg 8.6.   CT face showed right orbital fracture and lamina papyracea fracture. EOM intact on exam, low suspicion for entrapment. No noted retrobulbar hematoma on CT scan.   CXR  was done and showed no evidence of acute thoracic injury or abnormality.   Her case was discussed with Neurosurgery who recommended against operative management at this time. She was then admitted to the hospitalist step down unit for further evaluation and care with q2hr neuro checks. Per their request, Trauma was consulted and agreed to see the patient at the bedside given traumatic injuries.   Final Clinical Impressions(s) / ED Diagnoses   Final diagnoses:  Subdural hematoma ([place])  Fall, initial encounter  Multiple closed fractures of facial bone, initial encounter [facility])  Contusion of face, initial encounter    New Prescriptions Current Discharge Medication List       Zenovia Jarred, DO 09/28/16 1153    Dorie Rank, MD 09/28/16 1616

## 2016-09-27 NOTE — H&P (Signed)
History and Physical    Angela Franco [id number] DOB: 1929-10-05 DOA: 09/27/2016  PCP: Geoffery Lyons, MD  Patient coming from: Assisted living.  Chief Complaint: Fall.  HPI: Angela Franco is a 82 y.o. female with history of atrial fibrillation, chronic systolic heart failure, chronic kidney disease, chronic anemia had a fall at the assisted living facility. Patient states she was walking to get her walker when she suddenly fell. Not sure if she lost consciousness. Later she called for help and patient was brought to the ER. On exam patient has multiple ecchymotic areas on the face with the right peri-orbital hematoma. Initially patient also was felt to have some weakness on left side. CT of the head shows right subdural hematoma and on-call neurosurgeon Dr. Arnoldo Morale was consulted by ER physician. Dr. Arnoldo Morale discussed with the ED physician and felt that patient at this time will not require any intervention. Trauma surgeon Dr. Ninfa Linden also was consulted. Patient is on Apixaban for A. fib. Patient is being admitted for further observation. On my exam patient is moving all extremities. Has mild headache and facial pain.   ED Course: CT of the head and maxillofacial region shows right subdural hematoma and acute fracture of the right orbital floor.  Review of Systems: As per HPI, rest all negative.   Past Medical History:  Diagnosis Date  . Acute facial pain   . Altered mental status 08/24/2012  . Anemia, with significant drop in H/H 08/24/2012  . Arthritis    "fingers" (02/28/2016)  . Atrial fibrillation ([place])   . Bursitis of hip   . CHF (congestive heart failure) ([place])   . Complication of anesthesia    "I'm slow to recover when I'm put to sleep"  . COPD (chronic obstructive pulmonary disease) ([place])    Never smoked. Questionable diagnosis.  [name] Dyslipidemia   . Heart disease   . Hypertension   . Hyponatremia   . Insomnia   . Kidney failure    Stage 3  . LBBB (left bundle  branch block)   . Nonischemic cardiomyopathy ([place])   . On home oxygen therapy    "2L at night and prn during the day" (02/28/2016)  . Osteopenia   . Skin cancer of nose   . Trigeminal neuralgia   . UTI (lower urinary tract infection)     Past Surgical History:  Procedure Laterality Date  . APPENDECTOMY    . BREAST SURGERY Right    "took out some nodes"  . CARDIAC CATHETERIZATION  07/04/2012   normal coronaries, EF 30-35%, elevated R & L heart pressures, reduced CO, mild pulm HTN (Dr. K. Mali Hilty)  . CATARACT EXTRACTION W/ INTRAOCULAR LENS  IMPLANT, BILATERAL Bilateral   . DILATION AND CURETTAGE OF UTERUS    . LEFT AND RIGHT HEART CATHETERIZATION WITH CORONARY ANGIOGRAM N/A 07/04/2012   Procedure: LEFT AND RIGHT HEART CATHETERIZATION WITH CORONARY ANGIOGRAM;  Surgeon: Pixie Casino, MD;  Location[facility] CATH LAB;  Service: Cardiovascular;  Laterality: N/A;  . NM MYOCAR PERF WALL MOTION  2011   lexiscan myoview - normal LV systolic function w/normal wall motion, no evidence of scar/ischemia  . SKIN CANCER EXCISION     "nose"  . TONSILLECTOMY AND ADENOIDECTOMY    . TRANSTHORACIC ECHOCARDIOGRAM  2013   mild conc LVH, RV mildly dilated; LA severely dilated; RA mod dilated; mild-mod mitral annular calcif, calcif of anterior & posterior MV leaflet, mod MR; mod-severe TR with RSVP 30-3mmHg; mild calcif of AV leaflets and  mod aortic regurg; mild pulm valve regurg; pericardial effusion (right)     reports that she has never smoked. She has never used smokeless tobacco. She reports that she does not drink alcohol or use drugs.  Allergies  Allergen Reactions  . Other Shortness Of Breath, Other (See Comments) and Cough    Dust and mold (allergies)  . Tape Other (See Comments)    SKIN IS THIN AND WILL TEAR AND BRUISE EASILY!!  . Penicillins Hives    Has patient had a PCN reaction causing immediate rash, facial/tongue/throat swelling, SOB or lightheadedness with hypotension: Yes Has patient  had a PCN reaction causing severe rash involving mucus membranes or skin necrosis: No Has patient had a PCN reaction that required hospitalization No Has patient had a PCN reaction occurring within the last 10 years: No If all of the above answers are "NO", then may proceed with Cephalosporin use.    Family History  Problem Relation Age of Onset  . Stroke Father   . Heart attack Father   . Alzheimer's disease Mother   . Heart attack Brother   . Heart attack Brother     Prior to Admission medications   Medication Sig Start Date End Date Taking? Authorizing Provider  acetaminophen (TYLENOL) 325 MG tablet Take 650 mg by mouth every 8 (eight) hours as needed for fever (or pain). CANNOT EXCEED A COMBINED TOTAL OF 3,000 MILLIGRAMS/24 HOURS FROM ALL SOURCES OF TYLENOL   Yes Historical Provider, MD  acetaminophen-codeine (TYLENOL #3) 300-30 MG tablet Take 1-2 tablets by mouth every 6 (six) hours as needed for moderate pain. Patient taking differently: Take 1 tablet by mouth every 6 (six) hours as needed for moderate pain. CANNOT EXCEED A COMBINED TOTAL OF 3,000 MILLIGRAMS/24 HOURS FROM ALL SOURCES OF TYLENOL 05/13/16  Yes Domenic Moras, PA-C  allopurinol (ZYLOPRIM) 100 MG tablet Take 200 mg by mouth daily. 05/19/16  Yes Historical Provider, MD  apixaban (ELIQUIS) 2.5 MG TABS tablet Take 1 tablet (2.5 mg total) by mouth 2 (two) times daily. 11/16/15  Yes Pixie Casino, MD  bisacodyl (DULCOLAX) 5 MG EC tablet Take 1 tablet (5 mg total) by mouth daily as needed for moderate constipation. 07/22/16  Yes Belkys A Regalado, MD  calcium-vitamin D (OSCAL WITH D) 500-200 MG-UNIT tablet Take 1 tablet by mouth daily with breakfast.   Yes Historical Provider, MD  carvedilol (COREG) 6.25 MG tablet Take 1 tablet (6.25 mg total) by mouth 2 (two) times daily with a meal. 03/23/16  Yes Pixie Casino, MD  ferrous sulfate 325 (65 FE) MG tablet Take 325 mg by mouth daily with breakfast.   Yes Historical Provider, MD    furosemide (LASIX) 80 MG tablet Take 1 tablet (80 mg total) by mouth daily. 11/10/15 09/21/18 Yes Luke K Kilroy, PA-C  gabapentin (NEURONTIN) 600 MG tablet Take 1 tablet (600 mg total) by mouth 3 (three) times daily. Please dose at 8am, 2pm and 8pm Patient taking differently: Take 600 mg by mouth 3 (three) times daily. 0800, 1400, 2000 01/19/16  Yes Marcial Pacas, MD  guaiFENesin (MUCINEX) 600 MG 12 hr tablet Take 600 mg by mouth 2 (two) times daily.   Yes Historical Provider, MD  ipratropium-albuterol (DUONEB) 0.5-2.5 (3) MG/3ML SOLN Take 3 mLs by nebulization every 6 (six) hours as needed. Patient taking differently: Take 3 mLs by nebulization 4 (four) times daily.  01/04/16  Yes Liberty Handy, MD  Melatonin 5 MG TABS Take 5 mg by mouth at bedtime.  Yes Historical Provider, MD  OXcarbazepine (TRILEPTAL) 150 MG tablet Take 0.5 tablet twice daily. Patient taking differently: Take 75 mg by mouth 2 (two) times daily.  06/19/16  Yes Dennie Bible, NP  oxyCODONE (OXY IR/ROXICODONE) 5 MG immediate release tablet Take 5 mg by mouth See admin instructions. ONE HOUR PRIOR TO DENTAL APPOINTMENT (WHEN SCHEDULED)   Yes Historical Provider, MD  potassium chloride SA (K-DUR,KLOR-CON) 20 MEQ tablet Take 20 mEq by mouth daily.   Yes Historical Provider, MD  sennosides-docusate sodium (SENOKOT-S) 8.6-50 MG tablet Take 1 tablet by mouth 2 (two) times daily.   Yes Historical Provider, MD  simvastatin (ZOCOR) 10 MG tablet Take 0.5 tablets (5 mg total) by mouth daily at 6 PM. 11/24/15  Yes Pixie Casino, MD  traMADol (ULTRAM) 50 MG tablet Take 1 tablet (50 mg total) by mouth every 6 (six) hours as needed. Patient taking differently: Take 50 mg by mouth every 4 (four) hours as needed (for pain).  07/22/16  Yes Belkys A Regalado, MD  zolpidem (AMBIEN) 5 MG tablet Take 5 mg by mouth at bedtime.   Yes Historical Provider, MD  cefpodoxime (VANTIN) 200 MG tablet Take 1 tablet (200 mg total) by mouth every 12 (twelve)  hours. Patient not taking: Reported on 09/27/2016 07/22/16   Belkys A Regalado, MD  feeding supplement, ENSURE ENLIVE, (ENSURE ENLIVE) LIQD Take 237 mLs by mouth 2 (two) times daily between meals. Patient not taking: Reported on 09/27/2016 07/22/16   Belkys A Regalado, MD  saccharomyces boulardii (FLORASTOR) 250 MG capsule Take 1 capsule (250 mg total) by mouth 2 (two) times daily. Patient not taking: Reported on 09/27/2016 07/22/16   Elmarie Shiley, MD    Physical Exam: Vitals:   09/27/16 2030 09/27/16 2100 09/27/16 2200 09/27/16 2215  BP: 135/77 143/90 127/59 139/64  Pulse: 108 102 114 88  Resp:  24 22 21   SpO2: 98% 96% 98% 97%  Weight:      Height:          Constitutional: Moderately built and nourished. Vitals:   09/27/16 2030 09/27/16 2100 09/27/16 2200 09/27/16 2215  BP: 135/77 143/90 127/59 139/64  Pulse: 108 102 114 88  Resp:  24 22 21   SpO2: 98% 96% 98% 97%  Weight:      Height:       Eyes: Right periorbital hematoma with inability to open the right eyelid. ENMT: Multiple ecchymotic areas on the face. Neck: No neck rigidity no mass felt. Respiratory: No rhonchi or crepitations. Cardiovascular: S1 and S2 heard no murmurs appreciated. Abdomen: Soft nontender bowel sounds present. Musculoskeletal: No edema. No joint effusion. Skin: Multiple ecchymotic areas on the face. Neurologic: Alert awake oriented to time place and person. Moves all extremities. Psychiatric: Appears normal. Normal affect.   Labs on Admission: I have personally reviewed following labs and imaging studies  CBC:  Recent Labs Lab 09/27/16 1806 09/27/16 1812  WBC 8.9  --   NEUTROABS 5.8  --   HGB 8.6* 9.2*  HCT 26.5* 27.0*  MCV 96.0  --   PLT 230  --    Basic Metabolic Panel:  Recent Labs Lab 09/27/16 1806 09/27/16 1812  NA 128* 127*  K 3.6 3.5  CL 89* 92*  CO2 26  --   GLUCOSE 95 95  BUN 32* 31*  CREATININE 2.08* 2.20*  CALCIUM 9.7  --    GFR: Estimated Creatinine  Clearance: 15.6 mL/min (by C-G formula based on SCr of 2.2 mg/dL (  H)). Liver Function Tests:  Recent Labs Lab 09/27/16 1806  AST 29  ALT 13*  ALKPHOS 73  BILITOT 0.5  PROT 7.5  ALBUMIN 3.7   No results for input(s): LIPASE, AMYLASE in the last 168 hours. No results for input(s): AMMONIA in the last 168 hours. Coagulation Profile:  Recent Labs Lab 09/27/16 1806  INR 1.34   Cardiac Enzymes: No results for input(s): CKTOTAL, CKMB, CKMBINDEX, TROPONINI in the last 168 hours. BNP (last 3 results) No results for input(s): PROBNP in the last 8760 hours. HbA1C: No results for input(s): HGBA1C in the last 72 hours. CBG:  Recent Labs Lab 09/27/16 1803  GLUCAP 98   Lipid Profile: No results for input(s): CHOL, HDL, LDLCALC, TRIG, CHOLHDL, LDLDIRECT in the last 72 hours. Thyroid Function Tests: No results for input(s): TSH, T4TOTAL, FREET4, T3FREE, THYROIDAB in the last 72 hours. Anemia Panel: No results for input(s): VITAMINB12, FOLATE, FERRITIN, TIBC, IRON, RETICCTPCT in the last 72 hours. Urine analysis:    Component Value Date/Time   COLORURINE YELLOW 08/20/2016 1535   APPEARANCEUR CLOUDY (A) 08/20/2016 1535   LABSPEC 1.013 08/20/2016 1535   PHURINE 6.0 08/20/2016 1535   GLUCOSEU NEGATIVE 08/20/2016 1535   HGBUR MODERATE (A) 08/20/2016 1535   BILIRUBINUR NEGATIVE 08/20/2016 1535   KETONESUR NEGATIVE 08/20/2016 1535   PROTEINUR 100 (A) 08/20/2016 1535   UROBILINOGEN 0.2 08/23/2012 1449   NITRITE NEGATIVE 08/20/2016 1535   LEUKOCYTESUR LARGE (A) 08/20/2016 1535   Sepsis Labs: @LABRCNTIP (procalcitonin:4,lacticidven:4) )No results found for this or any previous visit (from the past 240 hour(s)).   Radiological Exams on Admission: Dg Chest 1 View  Result Date: 09/27/2016 CLINICAL DATA:  Fall today with right-sided chest pain, initial encounter EXAM: CHEST 1 VIEW COMPARISON:  09/22/2016 FINDINGS: Cardiac shadow remains enlarged. The lungs are well aerated  bilaterally without focal infiltrate or sizable effusion. No acute bony abnormality is seen. IMPRESSION: No acute abnormality noted. Electronically Signed   By: Inez Catalina M.D.   On: 09/27/2016 21:30   Ct Cervical Spine Wo Contrast  Result Date: 09/27/2016 CLINICAL DATA:  Patient fell at around 11 a.m. this morning with numbness in her left leg. EXAM: CT CERVICAL SPINE WITHOUT CONTRAST TECHNIQUE: Multidetector CT imaging of the cervical spine was performed without intravenous contrast. Multiplanar CT image reconstructions were also generated. COMPARISON:  None. FINDINGS: Alignment: Normal. Skull base and vertebrae: Intact. Craniocervical relationship is maintained. Soft tissues and spinal canal: No prevertebral soft tissue swelling. No significant canal stenosis. Low moderate right C3-4, C5-6 and mild C6-7 neural foraminal encroachment osteophytes. Mild left C3-4 and C5-6 neural foraminal narrowing from osteophytes. Disc levels: Degenerative disc space narrowing at C5-6 and C6-7 with small posterior marginal osteophytes. Bilateral uncovertebral osteoarthritis C3-4, C4-5, C5-6 and right C6-7. Upper chest: Pleural-parenchymal scarring bilaterally. No dominant mass seen. Other: Mastoids are aerated bilaterally. Bilateral extracranial carotid arteriosclerosis. IMPRESSION: Multilevel degenerative facet arthropathy. Degenerative disc disease C5-6 and C6-7. No acute cervical spine osseous abnormality. Electronically Signed   By: Ashley Royalty M.D.   On: 09/27/2016 18:41   Ct Head Code Stroke W/o Cm  Result Date: 09/27/2016 CLINICAL DATA:  Code stroke.  Flaccid left leg. EXAM: CT HEAD WITHOUT CONTRAST TECHNIQUE: Contiguous axial images were obtained from the base of the skull through the vertex without intravenous contrast. COMPARISON:  Prior MRI from 08/25/2012. FINDINGS: Brain: Acute right parafalcine subdural hematoma with extension along the tentorium. Hematoma measures up to 16 mm in greatest diameter. No  associated mass effect. And  spinal 9 knee discal about its off each see slow as of distance for/with from the McBride IIA well bone left neck I do not see anything else lobe status stroke from on 5/air off palm of the cisterns supper all by her palm entity down eyelids most extensive in facial fractures of small a a a a No other acute intracranial hemorrhage. Generalized cerebral atrophy with mild chronic microvascular ischemic disease. No other evidence for acute large vessel territory infarct. Small remote left cerebellar infarct noted. No mass lesion, midline shift, or mass effect. No hydrocephalus. Vascular: No hyperdense vessel. Scattered vascular calcifications present within the carotid siphons. Skull: Right periorbital and facial contusion. Scalp soft tissues otherwise unremarkable. There is associated right orbital floor fracture. Probable extension into the right lamina papyracea. Calvarium otherwise intact. Sinuses/Orbits: Right periorbital contusion. Globes grossly intact. There is associated right orbital floor fracture. Blood within the right maxillary sinus. No acute injury about the left globe and orbit. Mastoid air cells clear. ASPECTS [facility] [place] Stroke Program Early CT Score) - Ganglionic level infarction (caudate, lentiform nuclei, internal capsule, insula, M1-M3 cortex): 7 - Supraganglionic infarction (M4-M6 cortex): 3 Total score (0-10 with 10 being normal): 10 IMPRESSION: 1. Acute right parafalcine subdural hematoma with extension along the right tentorium. No significant mass effect. 2. No other evidence for acute intracranial infarct or other process identified. 3. ASPECTS is 10 4. Right periorbital contusion with associated right orbital fractures. Further evaluation with dedicated maxillofacial CT recommended. 5. Mild atrophy with chronic microvascular ischemic disease. Small remote left cerebellar infarct. Critical Value/emergent results were called by telephone at the time of interpretation on  09/27/2016 at 6:31 pm to Dr. Shon Hale , who verbally acknowledged these results. Electronically Signed   By: Jeannine Boga M.D.   On: 09/27/2016 18:34   Ct Maxillofacial Wo Contrast  Result Date: 09/27/2016 CLINICAL DATA:  Initial evaluation for acute trauma, fall. EXAM: CT MAXILLOFACIAL WITHOUT CONTRAST TECHNIQUE: Multidetector CT imaging of the maxillofacial structures was performed. Multiplanar CT image reconstructions were also generated. A small metallic BB was placed on the right temple in order to reliably differentiate right from left. COMPARISON:  Prior head CT from earlier the same day. FINDINGS: Osseous: Zygomatic arches intact. Possible the faint acute nondisplaced fracture through the posterior wall of the right maxillary sinus. No other acute maxillary fracture. Pterygoid plates intact. Nasal bones intact. Nasal septum midline and intact. Mandible intact. Mandibular condyles normally situated. No acute abnormality about the dentition. Orbits: There is an acute trapped nor tight right orbital floor fracture. Fracture is at the level of the right infraorbital foramen. Approximately 4 mm of superior displayed placement of the fracture fragment into the bony right orbit. Adjacent soft tissue stranding with few foci of gas. Right inferior rectus muscle remains normally positioned. Subtle associated fracture of the inferior right lamina for pre shift. Right orbital roof and lateral wall of the bony right orbit intact. Right globe intact. No frank retro-orbital hematoma. No acute osseous abnormality about the left orbit. Left globe intact without retro-orbital pathology. Sinuses: Right maxillary sinus is essentially completely opacified with blood and fluid within the lumen. Mild scattered mucosal thickening within the ethmoidal air cells. Paranasal sinuses are otherwise clear. No mastoid effusion. Soft tissues: Prominent right periorbital and facial contusion. Limited intracranial: Right-sided  subdural hematoma, better evaluated on prior head CT. IMPRESSION: 1. Acute right orbital floor fracture at the level of the right infraorbital foramen. 4 mm of superior displacement of the main fracture fragment which  closely approximates the right inferior rectus muscle. Intact globes without frank retro-orbital hematoma. 2. Associated subtle acute fracture of the right lamina papyracea. Orbital floor fracture also likely extends through the posterior wall of the right maxillary sinus as well. 3. Right periorbital and facial contusion. 4. Hemo sinus involving the right maxillary sinus. 5. Acute right-sided subdural hematoma, better evaluated on prior head CT. Electronically Signed   By: Jeannine Boga M.D.   On: 09/27/2016 20:15    EKG: Independently reviewed. A. fib with IVCD. Rate control.  Assessment/Plan Principal Problem:   Subdural hematoma (HCC) Active Problems:   Atrial fibrillation, chronic (HCC)   Trigeminal neuralgia   Moderate to severe pulmonary hypertension, PA pressure in 70s June 2013   Non-ischemic cardiomyopathy EF improved from 30-35% to 40-45% by Echo in 07/2012   LBBB (left bundle branch block)   Chronic renal impairment, stage 3 (moderate)   Chronic systolic heart failure ([place])   Palliative care patient   Right orbital fracture ([place])    1. Acute subdural hematoma and acute right orbital floor fracture status post fall - appreciate trauma surgery consult. On-call neurosurgeon Dr. Arnoldo Morale was consulted by the ER physician. Dr. Arnoldo Morale advised no surgical intervention at this time. At this time will closely monitor patient and keep patient off Apixaban. Will repeat CT head in the morning. Physical therapy consult as patient may need advanced level of care. 2. Atrial fibrillation - holding off apixaban due to subdural hematoma. Continue Coreg. 3. Chronic kidney disease stage III with worsening creatinine - hold off Lasix for now. 4. Chronic systolic heart failure -  holding off Lasix for now. Closely monitor respiratory status. 5. Chronic anemia - follow CBC. 6. Recently diagnosed adenocarcinoma of unknown primary - patient has opted no treatment for that as per patient's son. 7. Patient is under hospice for the cancer diagnosis.  Patient's CT chest show chronic thromboembolic phenomenon. Patient is having acute subdural hematoma. Not a candidate for any anticoagulation. Prior to the fall patient was on apixaban.   DVT prophylaxis: SCDs. Code Status: DO NOT RESUSCITATE.  Family Communication: Patient's son.  Disposition Plan: To be determined.  Consults called: Trauma surgery, neurosurgery physical therapy.  Admission status: Inpatient.    Rise Patience MD [facility] Pager [phone].  If 7PM-7AM, please contact night-coverage [email or web address] Password [email or web address]  09/27/2016, 10:25 PM

## 2016-09-27 NOTE — Consult Note (Signed)
Reason for Consult:General Trauma consult after fall Referring Physician: Dr. Reece Levy  Angela Franco is an 82 y.o. female.  HPI: This is an unfortunate 82 year old female with multiple chronic medical problems who fell at her assisted living facility around 11:00 today. She developed facial swelling and then left-sided extremity weakness and was eventually brought to the hospital. A code stroke was called. She had a CT scan of her head showing a subdural hematoma and facial fractures.  She is on blood thinning medication secondary to A. fib. The emergency room physician told me that they had talked with neurosurgery on call who said there was nothing for them to do given her comorbidities. She is already a DO NOT RESUSCITATE patient. She has a significant cardiac and lung history. She also apparently has metastatic adenocarcinoma with intra-abdominal adenopathy diagnosed earlier this year. According to some notes, she may not know this diagnosis. Currently, she is awake and alert and complains of face pain and right-sided chest pain. She denies abdominal pain or shortness of breath.  Past Medical History:  Diagnosis Date  . Acute facial pain   . Altered mental status 08/24/2012  . Anemia, with significant drop in H/H 08/24/2012  . Arthritis    "fingers" (02/28/2016)  . Atrial fibrillation ([place])   . Bursitis of hip   . CHF (congestive heart failure) ([place])   . Complication of anesthesia    "I'm slow to recover when I'm put to sleep"  . COPD (chronic obstructive pulmonary disease) ([place])    Never smoked. Questionable diagnosis.  [name] Dyslipidemia   . Heart disease   . Hypertension   . Hyponatremia   . Insomnia   . Kidney failure    Stage 3  . LBBB (left bundle branch block)   . Nonischemic cardiomyopathy ([place])   . On home oxygen therapy    "2L at night and prn during the day" (02/28/2016)  . Osteopenia   . Skin cancer of nose   . Trigeminal neuralgia   . UTI (lower urinary tract  infection)     Past Surgical History:  Procedure Laterality Date  . APPENDECTOMY    . BREAST SURGERY Right    "took out some nodes"  . CARDIAC CATHETERIZATION  07/04/2012   normal coronaries, EF 30-35%, elevated R & L heart pressures, reduced CO, mild pulm HTN (Dr. K. Mali Hilty)  . CATARACT EXTRACTION W/ INTRAOCULAR LENS  IMPLANT, BILATERAL Bilateral   . DILATION AND CURETTAGE OF UTERUS    . LEFT AND RIGHT HEART CATHETERIZATION WITH CORONARY ANGIOGRAM N/A 07/04/2012   Procedure: LEFT AND RIGHT HEART CATHETERIZATION WITH CORONARY ANGIOGRAM;  Surgeon: Pixie Casino, MD;  Location: [facility] CATH LAB;  Service: Cardiovascular;  Laterality: N/A;  . NM MYOCAR PERF WALL MOTION  2011   lexiscan myoview - normal LV systolic function w/normal wall motion, no evidence of scar/ischemia  . SKIN CANCER EXCISION     "nose"  . TONSILLECTOMY AND ADENOIDECTOMY    . TRANSTHORACIC ECHOCARDIOGRAM  2013   mild conc LVH, RV mildly dilated; LA severely dilated; RA mod dilated; mild-mod mitral annular calcif, calcif of anterior & posterior MV leaflet, mod MR; mod-severe TR with RSVP 30-72mHg; mild calcif of AV leaflets and mod aortic regurg; mild pulm valve regurg; pericardial effusion (right)    Family History  Problem Relation Age of Onset  . Stroke Father   . Heart attack Father   . Alzheimer's disease Mother   . Heart attack Brother   .  Heart attack Brother     Social History:  reports that she has never smoked. She has never used smokeless tobacco. She reports that she does not drink alcohol or use drugs.  Allergies:  Allergies  Allergen Reactions  . Other Shortness Of Breath, Other (See Comments) and Cough    Dust and mold (allergies)  . Tape Other (See Comments)    SKIN IS THIN AND WILL TEAR AND BRUISE EASILY!!  . Penicillins Hives    Has patient had a PCN reaction causing immediate rash, facial/tongue/throat swelling, SOB or lightheadedness with hypotension: Yes Has patient had a PCN reaction  causing severe rash involving mucus membranes or skin necrosis: No Has patient had a PCN reaction that required hospitalization No Has patient had a PCN reaction occurring within the last 10 years: No If all of the above answers are "NO", then may proceed with Cephalosporin use.    Medications: I have reviewed the patient's current medications.  Results for orders placed or performed during the hospital encounter of 09/27/16 (from the past 48 hour(s))  CBG monitoring, ED     Status: None   Collection Time: 09/27/16  6:03 PM  Result Value Ref Range   Glucose-Capillary 98 65 - 99 mg/dL   Comment 1 Notify RN   Ethanol     Status: None   Collection Time: 09/27/16  6:06 PM  Result Value Ref Range   Alcohol, Ethyl (B) <5 <5 mg/dL    Comment:        LOWEST DETECTABLE LIMIT FOR SERUM ALCOHOL IS 5 mg/dL FOR MEDICAL PURPOSES ONLY   Protime-INR     Status: Abnormal   Collection Time: 09/27/16  6:06 PM  Result Value Ref Range   Prothrombin Time 16.7 (H) 11.4 - 15.2 seconds   INR 1.34   APTT     Status: None   Collection Time: 09/27/16  6:06 PM  Result Value Ref Range   aPTT 32 24 - 36 seconds  CBC     Status: Abnormal   Collection Time: 09/27/16  6:06 PM  Result Value Ref Range   WBC 8.9 4.0 - 10.5 K/uL   RBC 2.76 (L) 3.87 - 5.11 MIL/uL   Hemoglobin 8.6 (L) 12.0 - 15.0 g/dL   HCT 26.5 (L) 36.0 - 46.0 %   MCV 96.0 78.0 - 100.0 fL   MCH 31.2 26.0 - 34.0 pg   MCHC 32.5 30.0 - 36.0 g/dL   RDW 16.6 (H) 11.5 - 15.5 %   Platelets 230 150 - 400 K/uL  Differential     Status: None   Collection Time: 09/27/16  6:06 PM  Result Value Ref Range   Neutrophils Relative % 66 %   Neutro Abs 5.8 1.7 - 7.7 K/uL   Lymphocytes Relative 22 %   Lymphs Abs 2.0 0.7 - 4.0 K/uL   Monocytes Relative 11 %   Monocytes Absolute 1.0 0.1 - 1.0 K/uL   Eosinophils Relative 1 %   Eosinophils Absolute 0.1 0.0 - 0.7 K/uL   Basophils Relative 0 %   Basophils Absolute 0.0 0.0 - 0.1 K/uL  Comprehensive metabolic  panel     Status: Abnormal   Collection Time: 09/27/16  6:06 PM  Result Value Ref Range   Sodium 128 (L) 135 - 145 mmol/L   Potassium 3.6 3.5 - 5.1 mmol/L   Chloride 89 (L) 101 - 111 mmol/L   CO2 26 22 - 32 mmol/L   Glucose, Bld 95 65 - 99  mg/dL   BUN 32 (H) 6 - 20 mg/dL   Creatinine, Ser 2.08 (H) 0.44 - 1.00 mg/dL   Calcium 9.7 8.9 - 10.3 mg/dL   Total Protein 7.5 6.5 - 8.1 g/dL   Albumin 3.7 3.5 - 5.0 g/dL   AST 29 15 - 41 U/L   ALT 13 (L) 14 - 54 U/L   Alkaline Phosphatase 73 38 - 126 U/L   Total Bilirubin 0.5 0.3 - 1.2 mg/dL   GFR calc non Af Amer 21 (L) >60 mL/min   GFR calc Af Amer 24 (L) >60 mL/min    Comment: (NOTE) The eGFR has been calculated using the CKD EPI equation. This calculation has not been validated in all clinical situations. eGFR's persistently <60 mL/min signify possible Chronic Kidney Disease.    Anion gap 13 5 - 15  I-stat troponin, ED (not at [facility], [facility])     Status: None   Collection Time: 09/27/16  6:11 PM  Result Value Ref Range   Troponin i, poc 0.00 0.00 - 0.08 ng/mL   Comment 3            Comment: Due to the release kinetics of cTnI, a negative result within the first hours of the onset of symptoms does not rule out myocardial infarction with certainty. If myocardial infarction is still suspected, repeat the test at appropriate intervals.   I-Stat Chem 8, ED  (not at [facility], [facility])     Status: Abnormal   Collection Time: 09/27/16  6:12 PM  Result Value Ref Range   Sodium 127 (L) 135 - 145 mmol/L   Potassium 3.5 3.5 - 5.1 mmol/L   Chloride 92 (L) 101 - 111 mmol/L   BUN 31 (H) 6 - 20 mg/dL   Creatinine, Ser 2.20 (H) 0.44 - 1.00 mg/dL   Glucose, Bld 95 65 - 99 mg/dL   Calcium, Ion 0.94 (L) 1.15 - 1.40 mmol/L   TCO2 26 0 - 100 mmol/L   Hemoglobin 9.2 (L) 12.0 - 15.0 g/dL   HCT 27.0 (L) 36.0 - 46.0 %    Dg Chest 1 View  Result Date: 09/27/2016 CLINICAL DATA:  Fall today with right-sided chest pain, initial encounter EXAM: CHEST 1 VIEW  COMPARISON:  09/22/2016 FINDINGS: Cardiac shadow remains enlarged. The lungs are well aerated bilaterally without focal infiltrate or sizable effusion. No acute bony abnormality is seen. IMPRESSION: No acute abnormality noted. Electronically Signed   By: Inez Catalina M.D.   On: 09/27/2016 21:30   Ct Cervical Spine Wo Contrast  Result Date: 09/27/2016 CLINICAL DATA:  Patient fell at around 11 a.m. this morning with numbness in her left leg. EXAM: CT CERVICAL SPINE WITHOUT CONTRAST TECHNIQUE: Multidetector CT imaging of the cervical spine was performed without intravenous contrast. Multiplanar CT image reconstructions were also generated. COMPARISON:  None. FINDINGS: Alignment: Normal. Skull base and vertebrae: Intact. Craniocervical relationship is maintained. Soft tissues and spinal canal: No prevertebral soft tissue swelling. No significant canal stenosis. Low moderate right C3-4, C5-6 and mild C6-7 neural foraminal encroachment osteophytes. Mild left C3-4 and C5-6 neural foraminal narrowing from osteophytes. Disc levels: Degenerative disc space narrowing at C5-6 and C6-7 with small posterior marginal osteophytes. Bilateral uncovertebral osteoarthritis C3-4, C4-5, C5-6 and right C6-7. Upper chest: Pleural-parenchymal scarring bilaterally. No dominant mass seen. Other: Mastoids are aerated bilaterally. Bilateral extracranial carotid arteriosclerosis. IMPRESSION: Multilevel degenerative facet arthropathy. Degenerative disc disease C5-6 and C6-7. No acute cervical spine osseous abnormality. Electronically Signed   By: Ashley Royalty  M.D.   On: 09/27/2016 18:41   Ct Head Code Stroke W/o Cm  Result Date: 09/27/2016 CLINICAL DATA:  Code stroke.  Flaccid left leg. EXAM: CT HEAD WITHOUT CONTRAST TECHNIQUE: Contiguous axial images were obtained from the base of the skull through the vertex without intravenous contrast. COMPARISON:  Prior MRI from 08/25/2012. FINDINGS: Brain: Acute right parafalcine subdural hematoma  with extension along the tentorium. Hematoma measures up to 16 mm in greatest diameter. No associated mass effect. And spinal 9 knee discal about its off each see slow as of distance for/with from the [address] IIA well bone left neck I do not see anything else lobe status stroke from on 5/air off palm of the cisterns supper all by her palm entity down eyelids most extensive in facial fractures of small a a a a No other acute intracranial hemorrhage. Generalized cerebral atrophy with mild chronic microvascular ischemic disease. No other evidence for acute large vessel territory infarct. Small remote left cerebellar infarct noted. No mass lesion, midline shift, or mass effect. No hydrocephalus. Vascular: No hyperdense vessel. Scattered vascular calcifications present within the carotid siphons. Skull: Right periorbital and facial contusion. Scalp soft tissues otherwise unremarkable. There is associated right orbital floor fracture. Probable extension into the right lamina papyracea. Calvarium otherwise intact. Sinuses/Orbits: Right periorbital contusion. Globes grossly intact. There is associated right orbital floor fracture. Blood within the right maxillary sinus. No acute injury about the left globe and orbit. Mastoid air cells clear. ASPECTS [facility] Stroke Program Early CT Score) - Ganglionic level infarction (caudate, lentiform nuclei, internal capsule, insula, M1-M3 cortex): 7 - Supraganglionic infarction (M4-M6 cortex): 3 Total score (0-10 with 10 being normal): 10 IMPRESSION: 1. Acute right parafalcine subdural hematoma with extension along the right tentorium. No significant mass effect. 2. No other evidence for acute intracranial infarct or other process identified. 3. ASPECTS is 10 4. Right periorbital contusion with associated right orbital fractures. Further evaluation with dedicated maxillofacial CT recommended. 5. Mild atrophy with chronic microvascular ischemic disease. Small remote left cerebellar infarct.  Critical Value/emergent results were called by telephone at the time of interpretation on 09/27/2016 at 6:31 pm to Dr. Shon Hale , who verbally acknowledged these results. Electronically Signed   By: Jeannine Boga M.D.   On: 09/27/2016 18:34   Ct Maxillofacial Wo Contrast  Result Date: 09/27/2016 CLINICAL DATA:  Initial evaluation for acute trauma, fall. EXAM: CT MAXILLOFACIAL WITHOUT CONTRAST TECHNIQUE: Multidetector CT imaging of the maxillofacial structures was performed. Multiplanar CT image reconstructions were also generated. A small metallic BB was placed on the right temple in order to reliably differentiate right from left. COMPARISON:  Prior head CT from earlier the same day. FINDINGS: Osseous: Zygomatic arches intact. Possible the faint acute nondisplaced fracture through the posterior wall of the right maxillary sinus. No other acute maxillary fracture. Pterygoid plates intact. Nasal bones intact. Nasal septum midline and intact. Mandible intact. Mandibular condyles normally situated. No acute abnormality about the dentition. Orbits: There is an acute trapped nor tight right orbital floor fracture. Fracture is at the level of the right infraorbital foramen. Approximately 4 mm of superior displayed placement of the fracture fragment into the bony right orbit. Adjacent soft tissue stranding with few foci of gas. Right inferior rectus muscle remains normally positioned. Subtle associated fracture of the inferior right lamina for pre shift. Right orbital roof and lateral wall of the bony right orbit intact. Right globe intact. No frank retro-orbital hematoma. No acute osseous abnormality about the left orbit. Left  globe intact without retro-orbital pathology. Sinuses: Right maxillary sinus is essentially completely opacified with blood and fluid within the lumen. Mild scattered mucosal thickening within the ethmoidal air cells. Paranasal sinuses are otherwise clear. No mastoid effusion. Soft tissues:  Prominent right periorbital and facial contusion. Limited intracranial: Right-sided subdural hematoma, better evaluated on prior head CT. IMPRESSION: 1. Acute right orbital floor fracture at the level of the right infraorbital foramen. 4 mm of superior displacement of the main fracture fragment which closely approximates the right inferior rectus muscle. Intact globes without frank retro-orbital hematoma. 2. Associated subtle acute fracture of the right lamina papyracea. Orbital floor fracture also likely extends through the posterior wall of the right maxillary sinus as well. 3. Right periorbital and facial contusion. 4. Hemo sinus involving the right maxillary sinus. 5. Acute right-sided subdural hematoma, better evaluated on prior head CT. Electronically Signed   By: Jeannine Boga M.D.   On: 09/27/2016 20:15    ROS Blood pressure 143/70, pulse 94, resp. rate 20, height 5' (1.524 m), weight 61.6 kg (135 lb 12.9 oz), SpO2 97 %. Physical Exam  Constitutional: She is oriented to person, place, and time. She appears well-developed and well-nourished. No distress.  HENT:  Head: Normocephalic.  Right Ear: External ear normal.  Left Ear: External ear normal.  Ecchymosis of the right side of the head and face  Eyes:  There is extensive swelling of her orbit. I can pull open the lids and she reports that her vision is normal. There is some redness of the conjunctiva on the right side  Neck: Normal range of motion. Neck supple. No tracheal deviation present.  Cervical spine is nontender  Cardiovascular: Normal rate and normal heart sounds.   Irregular rhythm  Respiratory: Effort normal and breath sounds normal. No respiratory distress. She exhibits tenderness.  Mild right-sided chest wall tenderness  GI: Soft. Bowel sounds are normal. She exhibits no distension. There is no tenderness.  Musculoskeletal: Normal range of motion.  Neurological: She is alert and oriented to person, place, and time.   Skin: Skin is warm and dry. No erythema.  Psychiatric: Her behavior is normal.    Assessment/Plan: Patient with multiple significant comorbidities who is status post fall with traumatic brain injury and facial fractures.  Again, she is a poor operative candidate for any type of injury. Apparently, there are no plans for any intervention from neurosurgery. The ER physician said that they did not plan to call the physician on face call as well. From a general trauma standpoint, given these facts, I do not believe she needs a CAT scan of her chest, abdomen, or pelvis. I have discussed this with her son who is at the bedside and he is in agreement. She is being admitted to the medical service. Her anticoagulation is being held. From a general trauma standpoint, she may have a diet.  Shalise Rosado A 09/27/2016, 9:38 PM

## 2016-09-27 NOTE — ED Notes (Signed)
The pt passed her swallow screen

## 2016-09-27 NOTE — ED Notes (Signed)
Stroke team recommends to continue with q2h neuro checks rt subdural bleed.

## 2016-09-27 NOTE — Progress Notes (Addendum)
Patient followed by Palliative Care in the community- she was evaluated by one of our providers. Main goal is pain control. She has a DNR and MOST form. Conservative work-up and management for reversible injuries.Please consider Palliative Care ED consultation- order may be placed in Epic at time of admission. Please also consider ordering pain medications for her comfort following her fall.  I can be reached at [phone], for additional after hours recommendations.  Lane Hacker, DO Palliative Medicine

## 2016-09-27 NOTE — Consult Note (Signed)
Neurology Consult Note  Reason for Consultation: CODE STROKE  Requesting provider: Activated by EMS in field; ED MD Dorie Rank  CC: face pain  HPI: This is an 5-yo woman who is brought to the ED by EMS after a reported fall at her assisted living facility. This occurred at about 1150 this morning; she was apparently last seen normal by staff at 1100. She was in her usual state of health until that point. She is not sure how she fell. After her fall she developed headache and facial hematoma. EMS was eventually activated and arrived to find that the patient was unable to move her left leg. They activated CODE STROKE and transported her to the ED.   I met her on arrival to the ED. She was noted to have significant ecchymosis and hematoma over the right side of her face. She was taken for an emergent CTH which showed an acute parafalcine subdural hematoma with mass effect upon the adjacent right frontal lobe. On exam, she had limited movement of the right face due to swelling and pain. She has some weakness in the left leg. CODE STROKE was canceled due to the presence of a subdural hematoma which explains her neurologic deficits.   PMH:  Past Medical History:  Diagnosis Date  . Acute facial pain   . Altered mental status 08/24/2012  . Anemia, with significant drop in H/H 08/24/2012  . Arthritis    "fingers" (02/28/2016)  . Atrial fibrillation ([place])   . Bursitis of hip   . CHF (congestive heart failure) ([place])   . Complication of anesthesia    "I'm slow to recover when I'm put to sleep"  . COPD (chronic obstructive pulmonary disease) ([place])    Never smoked. Questionable diagnosis.  [name] Dyslipidemia   . Heart disease   . Hypertension   . Hyponatremia   . Insomnia   . Kidney failure    Stage 3  . LBBB (left bundle branch block)   . Nonischemic cardiomyopathy ([place])   . On home oxygen therapy    "2L at night and prn during the day" (02/28/2016)  . Osteopenia   . Skin cancer of nose   .  Trigeminal neuralgia   . UTI (lower urinary tract infection)     PSH:  Past Surgical History:  Procedure Laterality Date  . APPENDECTOMY    . BREAST SURGERY Right    "took out some nodes"  . CARDIAC CATHETERIZATION  07/04/2012   normal coronaries, EF 30-35%, elevated R & L heart pressures, reduced CO, mild pulm HTN (Dr. K. Mali Hilty)  . CATARACT EXTRACTION W/ INTRAOCULAR LENS  IMPLANT, BILATERAL Bilateral   . DILATION AND CURETTAGE OF UTERUS    . LEFT AND RIGHT HEART CATHETERIZATION WITH CORONARY ANGIOGRAM N/A 07/04/2012   Procedure: LEFT AND RIGHT HEART CATHETERIZATION WITH CORONARY ANGIOGRAM;  Surgeon: Pixie Casino, MD;  Location: [facility] CATH LAB;  Service: Cardiovascular;  Laterality: N/A;  . NM MYOCAR PERF WALL MOTION  2011   lexiscan myoview - normal LV systolic function w/normal wall motion, no evidence of scar/ischemia  . SKIN CANCER EXCISION     "nose"  . TONSILLECTOMY AND ADENOIDECTOMY    . TRANSTHORACIC ECHOCARDIOGRAM  2013   mild conc LVH, RV mildly dilated; LA severely dilated; RA mod dilated; mild-mod mitral annular calcif, calcif of anterior & posterior MV leaflet, mod MR; mod-severe TR with RSVP 30-24mHg; mild calcif of AV leaflets and mod aortic regurg; mild pulm valve regurg; pericardial effusion (  right)    Family history: Family History  Problem Relation Age of Onset  . Stroke Father   . Heart attack Father   . Alzheimer's disease Mother   . Heart attack Brother   . Heart attack Brother     Social history:  Social History   Social History  . Marital status: Widowed    Spouse name: N/A  . Number of children: 3  . Years of education: 12   Occupational History  . Not on file.   Social History Main Topics  . Smoking status: Never Smoker  . Smokeless tobacco: Never Used  . Alcohol use No  . Drug use: No  . Sexual activity: No   Other Topics Concern  . Not on file   Social History Narrative   Patient is widowed.    Patient has 3 children.     Patient is retired.     Current outpatient meds: No outpatient prescriptions have been marked as taking for the 09/27/16 encounter [facility] Encounter).    Current inpatient meds:  Current Facility-Administered Medications  Medication Dose Route Frequency Provider Last Rate Last Dose  . fentaNYL (SUBLIMAZE) injection 50 mcg  50 mcg Intravenous Once Zenovia Jarred, DO       Current Outpatient Prescriptions  Medication Sig Dispense Refill  . acetaminophen (TYLENOL) 325 MG tablet Take 650 mg by mouth every 6 (six) hours as needed for moderate pain or fever.    [name] acetaminophen-codeine (TYLENOL #3) 300-30 MG tablet Take 1-2 tablets by mouth every 6 (six) hours as needed for moderate pain. 15 tablet 0  . allopurinol (ZYLOPRIM) 100 MG tablet Take 200 mg by mouth daily.  5  . apixaban (ELIQUIS) 2.5 MG TABS tablet Take 1 tablet (2.5 mg total) by mouth 2 (two) times daily. 60 tablet 11  . bisacodyl (DULCOLAX) 5 MG EC tablet Take 1 tablet (5 mg total) by mouth daily as needed for moderate constipation. 30 tablet 0  . calcium-vitamin D (OSCAL WITH D) 500-200 MG-UNIT tablet Take 1 tablet by mouth daily with breakfast.    . carvedilol (COREG) 6.25 MG tablet Take 1 tablet (6.25 mg total) by mouth 2 (two) times daily with a meal. 60 tablet 4  . cefpodoxime (VANTIN) 200 MG tablet Take 1 tablet (200 mg total) by mouth every 12 (twelve) hours. 6 tablet 0  . feeding supplement, ENSURE ENLIVE, (ENSURE ENLIVE) LIQD Take 237 mLs by mouth 2 (two) times daily between meals. 237 mL 12  . ferrous sulfate 325 (65 FE) MG tablet Take 325 mg by mouth daily with breakfast.    . furosemide (LASIX) 80 MG tablet Take 1 tablet (80 mg total) by mouth daily. 30 tablet 11  . gabapentin (NEURONTIN) 600 MG tablet Take 1 tablet (600 mg total) by mouth 3 (three) times daily. Please dose at 8am, 2pm and 8pm 270 tablet 3  . ipratropium-albuterol (DUONEB) 0.5-2.5 (3) MG/3ML SOLN Take 3 mLs by nebulization every 6 (six) hours as needed.  (Patient taking differently: Take 3 mLs by nebulization daily. ) 360 mL 12  . Melatonin 10 MG TABS Take 10 mg by mouth at bedtime.     . Melatonin 5 MG TABS Take 5 mg by mouth at bedtime.    . OXcarbazepine (TRILEPTAL) 150 MG tablet Take 0.5 tablet twice daily. (Patient taking differently: Take 75 mg by mouth 2 (two) times daily. ) 30 tablet 8  . potassium chloride SA (K-DUR,KLOR-CON) 20 MEQ tablet Take 20 mEq by  mouth daily.    [name] saccharomyces boulardii (FLORASTOR) 250 MG capsule Take 1 capsule (250 mg total) by mouth 2 (two) times daily. 20 capsule 0  . simvastatin (ZOCOR) 10 MG tablet Take 0.5 tablets (5 mg total) by mouth daily at 6 PM. 45 tablet 3  . traMADol (ULTRAM) 50 MG tablet Take 1 tablet (50 mg total) by mouth every 6 (six) hours as needed. (Patient taking differently: Take 50 mg by mouth every 6 (six) hours as needed for moderate pain. ) 15 tablet 0    Allergies: Allergies  Allergen Reactions  . Penicillins Hives    Has patient had a PCN reaction causing immediate rash, facial/tongue/throat swelling, SOB or lightheadedness with hypotension: No Has patient had a PCN reaction causing severe rash involving mucus membranes or skin necrosis: No Has patient had a PCN reaction that required hospitalization No Has patient had a PCN reaction occurring within the last 10 years: No If all of the above answers are "NO", then may proceed with Cephalosporin use.    ROS: As per HPI. A full 14-point review of systems was performed and is otherwise unremarkable.   PE:  BP 143/83   Pulse 109   Resp (!) 28   Ht 5' (1.524 m)   Wt 61.6 kg (135 lb 12.9 oz)   SpO2 98%   BMI 26.52 kg/m   General: WDWN. She appears mildly uncomfortable due to facial pain. AAO x4. Speech clear, no dysarthria. No aphasia. Speed of processing appears mildly slowed at times but she is able to follow all commands. Comportment is normal.  HEENT: Significant ecchymosis and hematoma is noted on the right side of the  face with associated edema. The right eye is swollen shut. MMM, OP clear. Dentition good. Sclerae anicteric.   CV: Irregularly irregular, no murmur. Carotid pulses full and symmetric, no bruits. Distal pulses 2+ and symmetric.  Lungs: CTAB.  Abdomen: Soft, non-distended, non-tender. Bowel sounds present x4.  Extremities: No C/C/E. Neuro:  CN: The left pupil is reactive from 3-->1 mm. The right is not easily visualized due to swelling of the eye. EOMI without nystagmus on the left. Facial sensation is intact to light touch on the left with allodynia on the right. Edema restricts movement of the right side of the face. The left face has normal strength. Hearing is intact to conversational voice. Palate elevates symmetrically and uvula is midline. Voice is normal in tone, pitch and quality. Bilateral SCM and trapezii are 5/5. Tongue is midline with normal bulk and mobility.  Motor: Normal bulk, tone, and strength with the exception of 4/5 strength in the LLE. No tremor or other abnormal movements. No drift.  Sensation: Intact to light touch and pinprick.  DTRs: 2+, symmetric with the exception of 3+ DTRs in the LLE.  Coordination: Finger-to-nose and heel-to-shin are without dysmetria.  Heel-to-shin is limited by weakness on the L. Finger taps are normal in amplitude and speed, no decrement.   Labs:  Lab Results  Component Value Date   WBC 8.9 09/27/2016   HGB 9.2 (L) 09/27/2016   HCT 27.0 (L) 09/27/2016   PLT 230 09/27/2016   GLUCOSE 95 09/27/2016   CHOL 157 08/24/2012   TRIG 51 08/24/2012   HDL 51 08/24/2012   LDLCALC 96 08/24/2012   ALT 13 (L) 09/27/2016   AST 29 09/27/2016   NA 127 (L) 09/27/2016   K 3.5 09/27/2016   CL 92 (L) 09/27/2016   CREATININE 2.20 (H) 09/27/2016  BUN 31 (H) 09/27/2016   CO2 26 09/27/2016   TSH 0.619 02/28/2016   INR 1.34 09/27/2016    Imaging:   I have personally and independently reviewed the [facility] without contrast from today. This shows an acute R  parafalcine subdural hematoma extending along the right side of the tentorium measuring 16 mm in its greatest diameter. There is some mild mass effect upon the parasagittal aspect of the right cerebral hemisphere. No obvious acute ischemia is noted. She has a mild to moderate burden of chronic small vessel ischemic disease.   Assessment and Plan:  1. Subdural hematoma: This is acute and traumatic, due to fall while on Eliquis. Recommend neurosurgical consultation. She may require reversal of her anticoagulation, which should be held. Antiplatelet agents and NSAIDs should be avoided for at least 7-10 days and therapeutic anticoagulation for 3-4 weeks.  2. LLE weakness: This is acute, due to her SDH. PT to evaluate and treat.   This was discussed with the ED MD, Dr. Tomi Bamberger, at the time of my consultation. I will sign off for now. Please call if any additional issues arise.

## 2016-09-27 NOTE — ED Triage Notes (Signed)
Pt arrives from [facility] via [facility]. Pt was seen by staff at 1150 and experienced a fall. Staff states she was normal at that time. Pt has right sided facial edema and bruising, left leg numbness and weakness and a left sided facial droop noted.

## 2016-09-27 NOTE — ED Notes (Signed)
Pt alert c/o lower spine pain from lying on her back  Alert oriented  Son at the bedside moves all extremities.  Bruise with swelling to the rt face pupil on the lt 3.0 reactive  Unable to get her rt eyelids open c/o too much pain

## 2016-09-27 NOTE — ED Notes (Signed)
Dr Ninfa Linden here to see. The pt just returned from xray  She had some blood coughed up    // from facial fractgures.  Pt c/o a headache now instead of lower back

## 2016-09-28 ENCOUNTER — Ambulatory Visit: Payer: Medicare Other | Admitting: Internal Medicine

## 2016-09-28 ENCOUNTER — Inpatient Hospital Stay (HOSPITAL_COMMUNITY): Payer: Medicare Other

## 2016-09-28 DIAGNOSIS — I6200 Nontraumatic subdural hemorrhage, unspecified: Secondary | ICD-10-CM

## 2016-09-28 DIAGNOSIS — I5022 Chronic systolic (congestive) heart failure: Secondary | ICD-10-CM

## 2016-09-28 DIAGNOSIS — N183 Chronic kidney disease, stage 3 (moderate): Secondary | ICD-10-CM

## 2016-09-28 DIAGNOSIS — I482 Chronic atrial fibrillation: Secondary | ICD-10-CM

## 2016-09-28 DIAGNOSIS — I428 Other cardiomyopathies: Secondary | ICD-10-CM

## 2016-09-28 LAB — URINE MICROSCOPIC-ADD ON: RBC / HPF: NONE SEEN RBC/hpf (ref 0–5)

## 2016-09-28 LAB — URINALYSIS, ROUTINE W REFLEX MICROSCOPIC
BILIRUBIN URINE: NEGATIVE
Glucose, UA: NEGATIVE mg/dL
HGB URINE DIPSTICK: NEGATIVE
Ketones, ur: NEGATIVE mg/dL
Nitrite: NEGATIVE
Protein, ur: NEGATIVE mg/dL
SPECIFIC GRAVITY, URINE: 1.01 (ref 1.005–1.030)
pH: 7 (ref 5.0–8.0)

## 2016-09-28 LAB — CBC
HEMATOCRIT: 24.3 % — AB (ref 36.0–46.0)
HEMOGLOBIN: 8.1 g/dL — AB (ref 12.0–15.0)
MCH: 32 pg (ref 26.0–34.0)
MCHC: 33.3 g/dL (ref 30.0–36.0)
MCV: 96 fL (ref 78.0–100.0)
Platelets: 232 10*3/uL (ref 150–400)
RBC: 2.53 MIL/uL — ABNORMAL LOW (ref 3.87–5.11)
RDW: 16.6 % — ABNORMAL HIGH (ref 11.5–15.5)
WBC: 6.2 10*3/uL (ref 4.0–10.5)

## 2016-09-28 LAB — BASIC METABOLIC PANEL
ANION GAP: 11 (ref 5–15)
BUN: 26 mg/dL — ABNORMAL HIGH (ref 6–20)
CALCIUM: 9.2 mg/dL (ref 8.9–10.3)
CHLORIDE: 91 mmol/L — AB (ref 101–111)
CO2: 30 mmol/L (ref 22–32)
Creatinine, Ser: 1.79 mg/dL — ABNORMAL HIGH (ref 0.44–1.00)
GFR calc Af Amer: 29 mL/min — ABNORMAL LOW (ref >60)
GFR calc non Af Amer: 25 mL/min — ABNORMAL LOW (ref >60)
GLUCOSE: 82 mg/dL (ref 65–99)
POTASSIUM: 3.5 mmol/L (ref 3.5–5.1)
Sodium: 132 mmol/L — ABNORMAL LOW (ref 135–145)

## 2016-09-28 LAB — RAPID URINE DRUG SCREEN, HOSP PERFORMED
AMPHETAMINES: NOT DETECTED
BENZODIAZEPINES: NOT DETECTED
Barbiturates: NOT DETECTED
Cocaine: NOT DETECTED
Opiates: POSITIVE — AB
Tetrahydrocannabinol: NOT DETECTED

## 2016-09-28 MED ORDER — IPRATROPIUM-ALBUTEROL 0.5-2.5 (3) MG/3ML IN SOLN: 3.0000 mL | Freq: Four times a day (QID) | RESPIRATORY_TRACT | Status: DC | PRN

## 2016-09-28 MED ORDER — IPRATROPIUM-ALBUTEROL 0.5-2.5 (3) MG/3ML IN SOLN
3.0000 mL | Freq: Four times a day (QID) | RESPIRATORY_TRACT | Status: DC
  Filled 2016-09-28: qty 3

## 2016-09-28 MED ORDER — ACETAMINOPHEN 500 MG PO TABS
1000.0000 mg | ORAL_TABLET | Freq: Three times a day (TID) | ORAL | Status: DC
  Administered 2016-09-28 – 2016-09-29 (×5): 1000 mg via ORAL
  Filled 2016-09-28 (×5): qty 2

## 2016-09-28 MED ORDER — MORPHINE SULFATE 15 MG PO TABS: 15.0000 mg | ORAL_TABLET | ORAL | Status: DC | PRN

## 2016-09-28 NOTE — Progress Notes (Signed)
Admitted via ED stretcher, A&Ox4, c/o right facial pain, multiple bruising right face & orbirt.  Reviewed safety precautions, care of valuables, oriented to room & plan of care.

## 2016-09-28 NOTE — Consult Note (Addendum)
Palliative Consult Requested by EDP  Problem List: 1. Acute Fall 2. SDH 3. Orbital Fracture 4. Multiple contusions  Following patient from [facility]. Family requested palliative care continue during hospitalization. She is doing well considering the severity of her fall. SDH is stable, she is tolerating pain but says its been "rough". She is fortunately handling opiates very well A&OX3 and decisional. We discussed her fall, and the status of her cancer of unknown primary- she will likely go to [facility] for short term rehab and at the point she may be eligible for hospice care-she is concerned about having a provider who is able to prescribe for her at [facility] and be accessible. We also discussed discontinuation of eloquis-probably indefinitely.  Recommendations:  1. Transition to PRN oral morphine 2. Scheduled Tylenol TID 3. Bowel regimen and careful monitoring 4. Stop Eloquis  Time: 2PM-3PM Total Time: 60 minutes Greater than 50%  of this time was spent counseling and coordinating care related to the above assessment and plan.   Lane Hacker, DO Palliative Medicine

## 2016-09-28 NOTE — Evaluation (Signed)
Physical Therapy Evaluation Patient Details Name: Angela Franco MRN: [id number] DOB: [birth year] Today's Date: 09/28/2016   History of Present Illness  Pt is an 82 y/o female who presents s/p fall at ALF. Pt sustained R orbital fracture and a SDH with no surgical intervention.   Clinical Impression  Pt admitted with above diagnosis. Pt currently with functional limitations due to the deficits listed below (see PT Problem List). At the time of PT eval pt was able to perform transfers and ambulation with +2 min assist to +1 min assist and RW for support. Pt is planning to return to facility she currently resides at in their skilled nursing unit prior to returning to the assisted living section where she currently lives. Pt will benefit from skilled PT to increase their independence and safety with mobility to allow discharge to the venue listed below.       Follow Up Recommendations SNF;Supervision/Assistance - 24 hour    Equipment Recommendations  None recommended by PT    Recommendations for Other Services       Precautions / Restrictions Precautions Precautions: Fall Restrictions Weight Bearing Restrictions: No      Mobility  Bed Mobility Overal bed mobility: Needs Assistance Bed Mobility: Supine to Sit     Supine to sit: HOB elevated;Min guard     General bed mobility comments: VC's for use of bed rails and general sequencing.   Transfers Overall transfer level: Needs assistance Equipment used: Rolling walker (2 wheeled) Transfers: Sit to/from Stand Sit to Stand: Min assist;+2 physical assistance         General transfer comment: Assist for power-up to full standing position. Pt required increased time to gain and maintain balance due to BLE buckling/shakiness.   Ambulation/Gait Ambulation/Gait assistance: Min assist Ambulation Distance (Feet): 40 Feet Assistive device: Rolling walker (2 wheeled) Gait Pattern/deviations: Step-through pattern;Decreased stride  length;Trunk flexed Gait velocity: Decreased Gait velocity interpretation: Below normal speed for age/gender General Gait Details: Slow and guarded gait. Pt required chair follow due to quick fatigue and unsteadiness. Min assist required for balance support.   Stairs            Wheelchair Mobility    Modified Rankin (Stroke Patients Only) Modified Rankin (Stroke Patients Only) Pre-Morbid Rankin Score: Moderate disability Modified Rankin: Moderately severe disability     Balance Overall balance assessment: Needs assistance Sitting-balance support: Feet supported;No upper extremity supported Sitting balance-Leahy Scale: Good     Standing balance support: Bilateral upper extremity supported;During functional activity Standing balance-Leahy Scale: Poor                               Pertinent Vitals/Pain Pain Assessment: Faces Faces Pain Scale: Hurts whole lot Pain Location: R ribs/breast  Pain Descriptors / Indicators: Discomfort;Grimacing;Guarding;Sharp Pain Intervention(s): Limited activity within patient's tolerance;Monitored during session;Repositioned    Home Living Family/patient expects to be discharged to:: Assisted living               Home Equipment: Walker - 2 wheels;Grab bars - toilet;Shower seat - built in;Hand held shower head;Grab bars - tub/shower Additional Comments: Uses O2 when sleeping    Prior Function Level of Independence: Independent with assistive device(s)         Comments: use of RW and O2 when needed and at night; walks to dining hall and attends multiple events at ALF     Hand Dominance   Dominant Hand: Right  Extremity/Trunk Assessment   Upper Extremity Assessment: Defer to OT evaluation           Lower Extremity Assessment: Generalized weakness      Cervical / Trunk Assessment: Kyphotic  Communication   Communication: No difficulties  Cognition Arousal/Alertness: Awake/alert Behavior During  Therapy: WFL for tasks assessed/performed Overall Cognitive Status: Within Functional Limits for tasks assessed                      General Comments      Exercises     Assessment/Plan    PT Assessment Patient needs continued PT services  PT Problem List Decreased strength;Decreased range of motion;Decreased activity tolerance;Decreased balance;Decreased mobility;Decreased knowledge of use of DME;Decreased safety awareness;Decreased knowledge of precautions;Pain          PT Treatment Interventions DME instruction;Gait training;Stair training;Functional mobility training;Therapeutic activities;Therapeutic exercise;Neuromuscular re-education;Patient/family education    PT Goals (Current goals can be found in the Care Plan section)  Acute Rehab PT Goals Patient Stated Goal: To SNF at d/c then back to ALF PT Goal Formulation: With patient Time For Goal Achievement: 10/12/16 Potential to Achieve Goals: Good    Frequency Min 3X/week   Barriers to discharge        Co-evaluation               End of Session Equipment Utilized During Treatment: Gait belt Activity Tolerance: Patient limited by fatigue Patient left: in chair;with call bell/phone within reach;with chair alarm set Nurse Communication: Mobility status         Time: [id number] PT Time Calculation (min) (ACUTE ONLY): 24 min   Charges:   PT Evaluation $PT Eval Moderate Complexity: 1 Procedure PT Treatments $Gait Training: 8-22 mins   PT G Codes:        Rolinda Roan 2016-10-09, 11:37 AM   Rolinda Roan, PT, DPT Acute Rehabilitation Services Pager: [phone]

## 2016-09-28 NOTE — Progress Notes (Signed)
Pt from [facility]; unable to complete MRSA PCR screening due to multiple facial fractures and pt having bleeding from nose. Hortencia Conradi RN

## 2016-09-28 NOTE — Progress Notes (Addendum)
Patient ID: Angela Franco, female   DOB: 1929-06-18, 82 y.o.   MRN: [id number]  PROGRESS NOTE    Angela Franco  [id number] DOB: May 10, 1929 DOA: 09/27/2016  PCP: Geoffery Lyons, MD   Brief Narrative:  82 y.o. female with past medical history of atrial fibrillation on apixaban, chronic systolic heart failure, chronic kidney disease, chronic anemia. Patient presented to [facility] status post fall at the assisted living facility. On admission, CT head and maxillofacial region showed a right subdural hematoma and acute fracture of the right orbital floor. On-call neurosurgeon Dr. Arnoldo Morale was consulted by ED physician and felt that patient at this time will not require any intervention. Dr. Ninfa Linden of general surgery was consulted as well and there was no plan for surgical intervention either. Plan was for anticoagulation to be on hold considering the findings of subdural hematoma and PT evaluation.  Assessment & Plan:   Principal Problem:   Subdural hematoma ([place]) / Right orbital fracture ([place]) - Noted on CT head status post mechanical fall - No plans for surgical intervention per neurosurgery - Follow-up PT evaluation and recommendations  Active Problems:   Atrial fibrillation, chronic (HCC) - CHADS vasc score 5 - Anticoagulation on hold due to subdural hematoma - Rate controlled with carvedilol 6.25 mg twice daily    Trigeminal neuralgia - Continue Trileptal 75 mg twice daily    Non-ischemic cardiomyopathy EF improved from 30-35% to 40-45% by Echo in [id number] / Chronic systolic congestive heart failure (HCC) - Compensated  - Lasix on hold due to worsening renal function    Dyslipidemia - Continue simvastatin 5 mg at bedtime    Hyponatremia - Sodium 127 on the admission but this morning 132, possibly related to subdural hematoma versus dehydration    Iron deficiency anemia / anemia of chronic kidney disease - Continue iron supplementation    Acute renal  failure superimposed on chronic kidney disease stage III - Baseline creatinine 2 months ago was 1.47 - Likely elevated due to Lasix - Lasix on hold   DVT prophylaxis: SCDs bilaterally Code Status: DNR/DNI Family Communication: spoke with daughter at the bedside this am Disposition Plan: Needs physical therapy evaluation for safe discharge planning   Consultants:   General surgery  Neurosurgery  Procedures:   None  Antimicrobials:   None    Subjective: No overnight events.  Objective: Vitals:   09/27/16 2215 09/28/16 0008 09/28/16 0514 09/28/16 0900  BP: 139/64 129/72 129/69 (!) 112/54  Pulse: 88 85 81 91  Resp: 21 18 18 17   Temp:  98.1 F (36.7 C) 98.6 F (37 C) 98 F (36.7 C)  TempSrc:  Oral Oral Oral  SpO2: 97% 95% 96% 97%  Weight:  61.1 kg (134 lb 11.2 oz)    Height:  5\' 2"  (1.575 m)      Intake/Output Summary (Last 24 hours) at 09/28/16 1213 Last data filed at 09/28/16 0900  Gross per 24 hour  Intake              240 ml  Output              400 ml  Net             -160 ml   Filed Weights   09/27/16 1834 09/28/16 0008  Weight: 61.6 kg (135 lb 12.9 oz) 61.1 kg (134 lb 11.2 oz)    Examination:  General exam: Appears calm and comfortable  Respiratory system: Clear to auscultation. Respiratory effort normal. Cardiovascular system:  S1 & S2 heard, RRR. No pedal edema. Gastrointestinal system: Abdomen is nondistended, soft and nontender. No organomegaly or masses felt. Normal bowel sounds heard. Central nervous system: No focal neurological deficits. Extremities: Symmetric 5 x 5 power. Skin: right orbital ecchymosis  Psychiatry: Judgement and insight appear normal. Mood & affect appropriate.   Data Reviewed: I have personally reviewed following labs and imaging studies  CBC:  Recent Labs Lab 09/27/16 1806 09/27/16 1812 09/28/16 0434  WBC 8.9  --  6.2  NEUTROABS 5.8  --   --   HGB 8.6* 9.2* 8.1*  HCT 26.5* 27.0* 24.3*  MCV 96.0  --  96.0    PLT 230  --  [id number]   Basic Metabolic Panel:  Recent Labs Lab 09/27/16 1806 09/27/16 1812 09/28/16 0434  NA 128* 127* 132*  K 3.6 3.5 3.5  CL 89* 92* 91*  CO2 26  --  30  GLUCOSE 95 95 82  BUN 32* 31* 26*  CREATININE 2.08* 2.20* 1.79*  CALCIUM 9.7  --  9.2   GFR: Estimated Creatinine Clearance: 20.1 mL/min (by C-G formula based on SCr of 1.79 mg/dL (H)). Liver Function Tests:  Recent Labs Lab 09/27/16 1806  AST 29  ALT 13*  ALKPHOS 73  BILITOT 0.5  PROT 7.5  ALBUMIN 3.7   No results for input(s): LIPASE, AMYLASE in the last 168 hours. No results for input(s): AMMONIA in the last 168 hours. Coagulation Profile:  Recent Labs Lab 09/27/16 1806  INR 1.34   Cardiac Enzymes: No results for input(s): CKTOTAL, CKMB, CKMBINDEX, TROPONINI in the last 168 hours. BNP (last 3 results) No results for input(s): PROBNP in the last 8760 hours. HbA1C: No results for input(s): HGBA1C in the last 72 hours. CBG:  Recent Labs Lab 09/27/16 1803  GLUCAP 98   Lipid Profile: No results for input(s): CHOL, HDL, LDLCALC, TRIG, CHOLHDL, LDLDIRECT in the last 72 hours. Thyroid Function Tests: No results for input(s): TSH, T4TOTAL, FREET4, T3FREE, THYROIDAB in the last 72 hours. Anemia Panel: No results for input(s): VITAMINB12, FOLATE, FERRITIN, TIBC, IRON, RETICCTPCT in the last 72 hours. Urine analysis:    Component Value Date/Time   COLORURINE YELLOW 09/28/2016 0501   APPEARANCEUR CLEAR 09/28/2016 0501   LABSPEC 1.010 09/28/2016 0501   PHURINE 7.0 09/28/2016 0501   GLUCOSEU NEGATIVE 09/28/2016 0501   HGBUR NEGATIVE 09/28/2016 0501   BILIRUBINUR NEGATIVE 09/28/2016 0501   KETONESUR NEGATIVE 09/28/2016 0501   PROTEINUR NEGATIVE 09/28/2016 0501   UROBILINOGEN 0.2 08/23/2012 1449   NITRITE NEGATIVE 09/28/2016 0501   LEUKOCYTESUR TRACE (A) 09/28/2016 0501   Sepsis Labs: @LABRCNTIP (procalcitonin:4,lacticidven:4)   )No results found for this or any previous visit (from  the past 240 hour(s)).    Radiology Studies: Dg Chest 1 View Result Date: 09/27/2016 No acute abnormality noted. Electronically Signed   By: Inez Catalina M.D.   On: 09/27/2016 21:30   Ct Head Wo Contrast Result Date: 09/28/2016 Large right parafalcine subdural hematoma with extension along the right tentorium similar to prior exam. Maximal thickness 1.6 cm. Tiny anterior left parafalcine without change. Right orbital floor and lamina papyracea fracture. Pre orbital hematoma. Opacification right maxillary sinus. Electronically Signed   By: Genia Del M.D.   On: 09/28/2016 07:59   Ct Cervical Spine Wo Contrast Result Date: 09/27/2016 Multilevel degenerative facet arthropathy. Degenerative disc disease C5-6 and C6-7. No acute cervical spine osseous abnormality. Electronically Signed   By: Ashley Royalty M.D.   On: 09/27/2016 18:41  Ct Head Code Stroke W/o Cm Result Date: 09/27/2016 1. Acute right parafalcine subdural hematoma with extension along the right tentorium. No significant mass effect. 2. No other evidence for acute intracranial infarct or other process identified. 3. ASPECTS is 10 4. Right periorbital contusion with associated right orbital fractures. Further evaluation with dedicated maxillofacial CT recommended. 5. Mild atrophy with chronic microvascular ischemic disease. Small remote left cerebellar infarct. Critical Value/emergent results were called by telephone at the time of interpretation on 09/27/2016 at 6:31 pm to Dr. Shon Hale , who verbally acknowledged these results. Electronically Signed   By: Jeannine Boga M.D.   On: 09/27/2016 18:34   Ct Maxillofacial Wo Contrast Result Date: 09/27/2016 1. Acute right orbital floor fracture at the level of the right infraorbital foramen. 4 mm of superior displacement of the main fracture fragment which closely approximates the right inferior rectus muscle. Intact globes without frank retro-orbital hematoma. 2. Associated subtle acute  fracture of the right lamina papyracea. Orbital floor fracture also likely extends through the posterior wall of the right maxillary sinus as well. 3. Right periorbital and facial contusion. 4. Hemo sinus involving the right maxillary sinus. 5. Acute right-sided subdural hematoma, better evaluated on prior head CT. Electronically Signed   By: Jeannine Boga M.D.   On: 09/27/2016 20:15      Scheduled Meds: . allopurinol  200 mg Oral Daily  . calcium-vitamin D  1 tablet Oral Q breakfast  . carvedilol  6.25 mg Oral BID WC  . ferrous sulfate  325 mg Oral Q breakfast  . gabapentin  600 mg Oral TID  . guaiFENesin  600 mg Oral BID  . OXcarbazepine  75 mg Oral BID  . potassium chloride SA  20 mEq Oral Daily  . senna-docusate  1 tablet Oral BID  . simvastatin  5 mg Oral q1800  . zolpidem  5 mg Oral QHS   Continuous Infusions:    LOS: 1 day    Time spent: 25 minutes  Greater than 50% of the time spent on counseling and coordinating the care.   Leisa Lenz, MD [facility] Pager [phone]  If 7PM-7AM, please contact night-coverage [email or web address] Password [email or web address] 09/28/2016, 12:13 PM

## 2016-09-28 NOTE — Progress Notes (Signed)
Subjective: feeling better, no chest or abdominal pain  Objective: Vital signs in last 24 hours: Temp:  [98 F (36.7 C)-98.6 F (37 C)] 98 F (36.7 C) (10/26 0900) Pulse Rate:  [81-114] 91 (10/26 0900) Resp:  [17-28] 17 (10/26 0900) BP: (112-147)/(54-90) 112/54 (10/26 0900) SpO2:  [87 %-98 %] 97 % (10/26 0900) Weight:  [61.1 kg (134 lb 11.2 oz)-61.6 kg (135 lb 12.9 oz)] 61.1 kg (134 lb 11.2 oz) (10/26 0008) Last BM Date: 09/27/16  Intake/Output from previous day: No intake/output data recorded. Intake/Output this shift: No intake/output data recorded.  General appearance: cooperative Head: R facial exxhymoses Resp: clear to auscultation bilaterally GI: soft, Nontender  Neuro: alert, speech fluent, F/C  Lab Results: CBC   Recent Labs  09/27/16 1806 09/27/16 1812 09/28/16 0434  WBC 8.9  --  6.2  HGB 8.6* 9.2* 8.1*  HCT 26.5* 27.0* 24.3*  PLT 230  --  232   BMET  Recent Labs  09/27/16 1806 09/27/16 1812 09/28/16 0434  NA 128* 127* 132*  K 3.6 3.5 3.5  CL 89* 92* 91*  CO2 26  --  30  GLUCOSE 95 95 82  BUN 32* 31* 26*  CREATININE 2.08* 2.20* 1.79*  CALCIUM 9.7  --  9.2   PT/INR  Recent Labs  09/27/16 1806  LABPROT 16.7*  INR 1.34   ABG No results for input(s): PHART, HCO3 in the last 72 hours.  Invalid input(s): PCO2, PO2  Studies/Results: Dg Chest 1 View  Result Date: 09/27/2016 CLINICAL DATA:  Fall today with right-sided chest pain, initial encounter EXAM: CHEST 1 VIEW COMPARISON:  09/22/2016 FINDINGS: Cardiac shadow remains enlarged. The lungs are well aerated bilaterally without focal infiltrate or sizable effusion. No acute bony abnormality is seen. IMPRESSION: No acute abnormality noted. Electronically Signed   By: Inez Catalina M.D.   On: 09/27/2016 21:30   Ct Head Wo Contrast  Result Date: 09/28/2016 CLINICAL DATA:  82 year old female follow-up subdural hematoma Subsequent encounter. EXAM: CT HEAD WITHOUT CONTRAST TECHNIQUE:  Contiguous axial images were obtained from the base of the skull through the vertex without intravenous contrast. COMPARISON:  09/27/2016. FINDINGS: Brain: Large right parafalcine subdural hematoma with extension along the right tentorium similar to prior exam. Maximal thickness 1.6 cm. Tiny anterior left parafalcine without change. Artifact inferior temporal lobes bilaterally suspected. No CT evidence of large acute infarct. Chronic microvascular changes. Mild global atrophy without hydrocephalus. Vascular: Vascular calcifications. Skull: No skull fracture. Sinuses/Orbits: Right orbital floor and lamina papyracea fracture. Pre orbital hematoma. Opacification right maxillary sinus. Other: Negative IMPRESSION: Large right parafalcine subdural hematoma with extension along the right tentorium similar to prior exam. Maximal thickness 1.6 cm. Tiny anterior left parafalcine without change. Right orbital floor and lamina papyracea fracture. Pre orbital hematoma. Opacification right maxillary sinus. Electronically Signed   By: Genia Del M.D.   On: 09/28/2016 07:59   Ct Cervical Spine Wo Contrast  Result Date: 09/27/2016 CLINICAL DATA:  Patient fell at around 11 a.m. this morning with numbness in her left leg. EXAM: CT CERVICAL SPINE WITHOUT CONTRAST TECHNIQUE: Multidetector CT imaging of the cervical spine was performed without intravenous contrast. Multiplanar CT image reconstructions were also generated. COMPARISON:  None. FINDINGS: Alignment: Normal. Skull base and vertebrae: Intact. Craniocervical relationship is maintained. Soft tissues and spinal canal: No prevertebral soft tissue swelling. No significant canal stenosis. Low moderate right C3-4, C5-6 and mild C6-7 neural foraminal encroachment osteophytes. Mild left C3-4 and C5-6 neural foraminal narrowing from osteophytes. Disc  levels: Degenerative disc space narrowing at C5-6 and C6-7 with small posterior marginal osteophytes. Bilateral uncovertebral  osteoarthritis C3-4, C4-5, C5-6 and right C6-7. Upper chest: Pleural-parenchymal scarring bilaterally. No dominant mass seen. Other: Mastoids are aerated bilaterally. Bilateral extracranial carotid arteriosclerosis. IMPRESSION: Multilevel degenerative facet arthropathy. Degenerative disc disease C5-6 and C6-7. No acute cervical spine osseous abnormality. Electronically Signed   By: Ashley Royalty M.D.   On: 09/27/2016 18:41   Ct Head Code Stroke W/o Cm  Result Date: 09/27/2016 CLINICAL DATA:  Code stroke.  Flaccid left leg. EXAM: CT HEAD WITHOUT CONTRAST TECHNIQUE: Contiguous axial images were obtained from the base of the skull through the vertex without intravenous contrast. COMPARISON:  Prior MRI from 08/25/2012. FINDINGS: Brain: Acute right parafalcine subdural hematoma with extension along the tentorium. Hematoma measures up to 16 mm in greatest diameter. No associated mass effect. And spinal 9 knee discal about its off each see slow as of distance for/with from the [place] IIA well bone left neck I do not see anything else lobe status stroke from on 5/air off palm of the cisterns supper all by her palm entity down eyelids most extensive in facial fractures of small a a a a No other acute intracranial hemorrhage. Generalized cerebral atrophy with mild chronic microvascular ischemic disease. No other evidence for acute large vessel territory infarct. Small remote left cerebellar infarct noted. No mass lesion, midline shift, or mass effect. No hydrocephalus. Vascular: No hyperdense vessel. Scattered vascular calcifications present within the carotid siphons. Skull: Right periorbital and facial contusion. Scalp soft tissues otherwise unremarkable. There is associated right orbital floor fracture. Probable extension into the right lamina papyracea. Calvarium otherwise intact. Sinuses/Orbits: Right periorbital contusion. Globes grossly intact. There is associated right orbital floor fracture. Blood within the right  maxillary sinus. No acute injury about the left globe and orbit. Mastoid air cells clear. ASPECTS [facility] Stroke Program Early CT Score) - Ganglionic level infarction (caudate, lentiform nuclei, internal capsule, insula, M1-M3 cortex): 7 - Supraganglionic infarction (M4-M6 cortex): 3 Total score (0-10 with 10 being normal): 10 IMPRESSION: 1. Acute right parafalcine subdural hematoma with extension along the right tentorium. No significant mass effect. 2. No other evidence for acute intracranial infarct or other process identified. 3. ASPECTS is 10 4. Right periorbital contusion with associated right orbital fractures. Further evaluation with dedicated maxillofacial CT recommended. 5. Mild atrophy with chronic microvascular ischemic disease. Small remote left cerebellar infarct. Critical Value/emergent results were called by telephone at the time of interpretation on 09/27/2016 at 6:31 pm to Dr. Shon Hale , who verbally acknowledged these results. Electronically Signed   By: Jeannine Boga M.D.   On: 09/27/2016 18:34   Ct Maxillofacial Wo Contrast  Result Date: 09/27/2016 CLINICAL DATA:  Initial evaluation for acute trauma, fall. EXAM: CT MAXILLOFACIAL WITHOUT CONTRAST TECHNIQUE: Multidetector CT imaging of the maxillofacial structures was performed. Multiplanar CT image reconstructions were also generated. A small metallic BB was placed on the right temple in order to reliably differentiate right from left. COMPARISON:  Prior head CT from earlier the same day. FINDINGS: Osseous: Zygomatic arches intact. Possible the faint acute nondisplaced fracture through the posterior wall of the right maxillary sinus. No other acute maxillary fracture. Pterygoid plates intact. Nasal bones intact. Nasal septum midline and intact. Mandible intact. Mandibular condyles normally situated. No acute abnormality about the dentition. Orbits: There is an acute trapped nor tight right orbital floor fracture. Fracture is at the  level of the right infraorbital foramen. Approximately 4 mm of  superior displayed placement of the fracture fragment into the bony right orbit. Adjacent soft tissue stranding with few foci of gas. Right inferior rectus muscle remains normally positioned. Subtle associated fracture of the inferior right lamina for pre shift. Right orbital roof and lateral wall of the bony right orbit intact. Right globe intact. No frank retro-orbital hematoma. No acute osseous abnormality about the left orbit. Left globe intact without retro-orbital pathology. Sinuses: Right maxillary sinus is essentially completely opacified with blood and fluid within the lumen. Mild scattered mucosal thickening within the ethmoidal air cells. Paranasal sinuses are otherwise clear. No mastoid effusion. Soft tissues: Prominent right periorbital and facial contusion. Limited intracranial: Right-sided subdural hematoma, better evaluated on prior head CT. IMPRESSION: 1. Acute right orbital floor fracture at the level of the right infraorbital foramen. 4 mm of superior displacement of the main fracture fragment which closely approximates the right inferior rectus muscle. Intact globes without frank retro-orbital hematoma. 2. Associated subtle acute fracture of the right lamina papyracea. Orbital floor fracture also likely extends through the posterior wall of the right maxillary sinus as well. 3. Right periorbital and facial contusion. 4. Hemo sinus involving the right maxillary sinus. 5. Acute right-sided subdural hematoma, better evaluated on prior head CT. Electronically Signed   By: Jeannine Boga M.D.   On: 09/27/2016 20:15    Anti-infectives: Anti-infectives    None      Assessment/Plan: Fall R orbit FX R SDH - exam stable, rec PT/OT Hx metastatic adenocarcinoma Medical issues - per primary service DNR   LOS: 1 day    Georganna Skeans, MD, MPH, FACS Trauma: [phone] General Surgery: [phone]  10/26/2017Patient  ID: Angela Franco, female   DOB: February 07, 1929, 82 y.o.   MRN: [id number]

## 2016-09-29 ENCOUNTER — Inpatient Hospital Stay (HOSPITAL_COMMUNITY): Payer: Medicare Other

## 2016-09-29 ENCOUNTER — Telehealth: Payer: Self-pay | Admitting: Internal Medicine

## 2016-09-29 DIAGNOSIS — R252 Cramp and spasm: Secondary | ICD-10-CM

## 2016-09-29 DIAGNOSIS — S0281XD Fracture of other specified skull and facial bones, right side, subsequent encounter for fracture with routine healing: Secondary | ICD-10-CM

## 2016-09-29 LAB — BASIC METABOLIC PANEL
ANION GAP: 9 (ref 5–15)
BUN: 28 mg/dL — ABNORMAL HIGH (ref 6–20)
CHLORIDE: 91 mmol/L — AB (ref 101–111)
CO2: 30 mmol/L (ref 22–32)
Calcium: 9.3 mg/dL (ref 8.9–10.3)
Creatinine, Ser: 1.98 mg/dL — ABNORMAL HIGH (ref 0.44–1.00)
GFR calc non Af Amer: 22 mL/min — ABNORMAL LOW (ref >60)
GFR, EST AFRICAN AMERICAN: 26 mL/min — AB (ref >60)
GLUCOSE: 95 mg/dL (ref 65–99)
POTASSIUM: 3.8 mmol/L (ref 3.5–5.1)
Sodium: 130 mmol/L — ABNORMAL LOW (ref 135–145)

## 2016-09-29 LAB — CBC
HEMATOCRIT: 24.6 % — AB (ref 36.0–46.0)
HEMOGLOBIN: 7.9 g/dL — AB (ref 12.0–15.0)
MCH: 31.7 pg (ref 26.0–34.0)
MCHC: 32.1 g/dL (ref 30.0–36.0)
MCV: 98.8 fL (ref 78.0–100.0)
Platelets: 164 10*3/uL (ref 150–400)
RBC: 2.49 MIL/uL — AB (ref 3.87–5.11)
RDW: 17.1 % — ABNORMAL HIGH (ref 11.5–15.5)
WBC: 5.5 10*3/uL (ref 4.0–10.5)

## 2016-09-29 LAB — MAGNESIUM: Magnesium: 2.3 mg/dL (ref 1.7–2.4)

## 2016-09-29 LAB — PHOSPHORUS: Phosphorus: 4.2 mg/dL (ref 2.5–4.6)

## 2016-09-29 MED ORDER — POTASSIUM CHLORIDE CRYS ER 20 MEQ PO TBCR
40.0000 meq | EXTENDED_RELEASE_TABLET | Freq: Once | ORAL | Status: AC
  Administered 2016-09-29: 40 meq via ORAL
  Filled 2016-09-29: qty 2

## 2016-09-29 MED ORDER — QUININE SULFATE 324 MG PO CAPS
324.0000 mg | ORAL_CAPSULE | Freq: Every day | ORAL | Status: DC
  Administered 2016-09-29: 324 mg via ORAL
  Filled 2016-09-29 (×2): qty 1

## 2016-09-29 MED ORDER — BACLOFEN 10 MG PO TABS
10.0000 mg | ORAL_TABLET | Freq: Three times a day (TID) | ORAL | Status: DC
  Administered 2016-09-29 (×3): 10 mg via ORAL
  Filled 2016-09-29 (×3): qty 1

## 2016-09-29 NOTE — Clinical Social Work Placement (Deleted)
   CLINICAL SOCIAL WORK PLACEMENT  NOTE  Date:  09/29/2016  Patient Details  Name: Angela Franco MRN: [id number] Date of Birth: Feb 27, 1929  Clinical Social Work is seeking post-discharge placement for this patient at the [place] level of care (*CSW will initial, date and re-position this form in  chart as items are completed):  Yes   Patient/family provided with [facility] Work Department's list of facilities offering this level of care within the geographic area requested by the patient (or if unable, by the patient's family).  Yes   Patient/family informed of their freedom to choose among providers that offer the needed level of care, that participate in Medicare, Medicaid or managed care program needed by the patient, have an available bed and are willing to accept the patient.  Yes   Patient/family informed of [facility]'s ownership interest in [facility] and [facility], as well as of the fact that they are under no obligation to receive care at these facilities.  PASRR submitted to EDS on       PASRR number received on       Existing PASRR number confirmed on 09/29/16     FL2 transmitted to all facilities in geographic area requested by pt/family on       FL2 transmitted to all facilities within larger geographic area on       Patient informed that his/her managed care company has contracts with or will negotiate with certain facilities, including the following:        Yes   Patient/family informed of bed offers received.  Patient [facility] bed at [facility]     Physician recommends and patient chooses bed at      Patient to be transferred to [facility] on  .  Patient to be transferred to facility by       Patient family notified on   of transfer.  Name of family member notified:        PHYSICIAN       Additional Comment:    _______________________________________________ Darden Dates,  LCSW 09/29/2016, 11:31 AM

## 2016-09-29 NOTE — NC FL2 (Signed)
[place] MEDICAID FL2 LEVEL OF CARE SCREENING TOOL     IDENTIFICATION  Patient Name: Angela Franco Birthdate: Feb 18, 1929 Sex: female Admission Date (Current Location): 09/27/2016  [facility] and [place] Number:  Herbalist and Address:  [facility] [place], [place] [address] [place], [place] [address]      Provider Number: [id number]  Attending Physician Name and Address:  Louellen Molder, MD  Relative Name and Phone Number:       Current Level of Care: Hospital Recommended Level of Care: [place] Prior Approval Number:    Date Approved/Denied:   PASRR Number: [id number]  Discharge Plan: SNF    Current Diagnoses: Patient Active Problem List   Diagnosis Date Noted  . Palliative care patient 09/27/2016  . Subdural hematoma ([place]) 09/27/2016  . Right orbital fracture ([place]) 09/27/2016  . Sepsis ([place]) 07/19/2016  . Aspiration pneumonia ([place]) 07/19/2016  . Fever 07/19/2016  . Leukocytosis 07/19/2016  . Acute respiratory failure with hypoxia ([place]) 02/28/2016  . Acute renal failure superimposed on stage 3 chronic kidney disease ([place]) 02/28/2016  . Acute on chronic diastolic heart failure ([place])   . Acute on chronic systolic heart failure ([place])   . Respiratory syncytial virus (RSV) infection   . Chronic systolic heart failure ([place])   . COPD exacerbation ([place]) 01/21/2016  . Chronic renal impairment, stage 3 (moderate) 11/10/2015  . COPD by CXR 08/26/2012  . Non-ischemic cardiomyopathy EF improved from 30-35% to 40-45% by Echo in 07/2012 06/30/2012  . LBBB (left bundle branch block) 06/30/2012  . Chronic anticoagulation 06/30/2012  . HTN (hypertension) 06/30/2012  . Systolic and diastolic CHF, acute on chronic,  06/29/2012  . Hypo-osmolality and hyponatremia 06/29/2012  . Normocytic anemia 06/29/2012  . Atrial fibrillation, chronic ([place]) 06/29/2012  . Trigeminal neuralgia 06/29/2012  . Moderate to severe pulmonary hypertension, PA  pressure in 70s June 2013 06/29/2012    Orientation RESPIRATION BLADDER Height & Weight     Self, Time, Situation, Place    Continent Weight: 134 lb 11.2 oz (61.1 kg) Height:  5\' 2"  (157.5 cm)  BEHAVIORAL SYMPTOMS/MOOD NEUROLOGICAL BOWEL NUTRITION STATUS      Continent Diet (Regular Diet)  AMBULATORY STATUS COMMUNICATION OF NEEDS Skin   Limited Assist Verbally Normal                       Personal Care Assistance Level of Assistance  Bathing, Feeding, Dressing Bathing Assistance: Limited assistance Feeding assistance: Independent Dressing Assistance: Limited assistance     Functional Limitations Info  Sight, Hearing, Speech Sight Info: Adequate Hearing Info: Adequate Speech Info: Adequate    SPECIAL CARE FACTORS FREQUENCY  PT (By licensed PT), OT (By licensed OT)     PT Frequency: 5 OT Frequency: 5            Contractures Contractures Info: Not present    Additional Factors Info  Code Status, Allergies Code Status Info: Other, Tape, Penicillins Allergies Info: Other, Tape, Penicillins           Current Medications (09/29/2016):  This is the current hospital active medication list Current Facility-Administered Medications  Medication Dose Route Frequency Provider Last Rate Last Dose  . acetaminophen (TYLENOL) tablet 650 mg  650 mg Oral Q6H PRN Rise Patience, MD       Or  . acetaminophen (TYLENOL) suppository 650 mg  650 mg Rectal Q6H PRN Rise Patience, MD      . acetaminophen (TYLENOL)  tablet 1,000 mg  1,000 mg Oral TID Acquanetta Chain, DO   1,000 mg at 09/29/16 [id number]  . allopurinol (ZYLOPRIM) tablet 200 mg  200 mg Oral Daily Rise Patience, MD   200 mg at 09/29/16 0953  . baclofen (LIORESAL) tablet 10 mg  10 mg Oral TID Nishant Dhungel, MD   10 mg at 09/29/16 1009  . bisacodyl (DULCOLAX) EC tablet 5 mg  5 mg Oral Daily PRN Rise Patience, MD      . calcium-vitamin D (OSCAL WITH D) 500-200 MG-UNIT per tablet 1 tablet  1 tablet  Oral Q breakfast Rise Patience, MD   1 tablet at 09/29/16 0818  . carvedilol (COREG) tablet 6.25 mg  6.25 mg Oral BID WC Rise Patience, MD   6.25 mg at 09/29/16 0818  . ferrous sulfate tablet 325 mg  325 mg Oral Q breakfast Rise Patience, MD   325 mg at 09/29/16 0818  . gabapentin (NEURONTIN) tablet 600 mg  600 mg Oral TID Rise Patience, MD   600 mg at 09/29/16 0818  . guaiFENesin (MUCINEX) 12 hr tablet 600 mg  600 mg Oral BID Rise Patience, MD   600 mg at 09/29/16 0957  . ipratropium-albuterol (DUONEB) 0.5-2.5 (3) MG/3ML nebulizer solution 3 mL  3 mL Nebulization Q6H PRN Robbie Lis, MD      . morphine (MSIR) tablet 15 mg  15 mg Oral Q4H PRN Acquanetta Chain, DO      . morphine 4 MG/ML injection 1 mg  1 mg Intravenous Q4H PRN Rise Patience, MD   1 mg at 09/27/16 2218  . ondansetron (ZOFRAN) tablet 4 mg  4 mg Oral Q6H PRN Rise Patience, MD       Or  . ondansetron [facility]) injection 4 mg  4 mg Intravenous Q6H PRN Rise Patience, MD      . OXcarbazepine (TRILEPTAL) tablet 75 mg  75 mg Oral BID Rise Patience, MD   75 mg at 09/29/16 0956  . potassium chloride SA (K-DUR,KLOR-CON) CR tablet 20 mEq  20 mEq Oral Daily Rise Patience, MD   20 mEq at 09/28/16 1023  . senna-docusate (Senokot-S) tablet 1 tablet  1 tablet Oral BID Rise Patience, MD   1 tablet at 09/29/16 [phone]  . simvastatin (ZOCOR) tablet 5 mg  5 mg Oral q1800 Rise Patience, MD   5 mg at 09/28/16 1731  . zolpidem (AMBIEN) tablet 5 mg  5 mg Oral QHS Rise Patience, MD   5 mg at 09/28/16 2119     Discharge Medications: Please see discharge summary for a list of discharge medications.  Relevant Imaging Results:  Relevant Lab Results:   Additional Information SSN:  [id number]  Darden Dates, LCSW

## 2016-09-29 NOTE — Telephone Encounter (Signed)
Called and spoke with pts daughter and she stated that the pt is currently in the hospital and she is not really sure of a discharge date.  She stated that the pt will be going to rehab once discharged.  I have cancelled the appt for 10/31 and the daughter can call at a later date to reschedule.

## 2016-09-29 NOTE — Evaluation (Signed)
Occupational Therapy Evaluation Patient Details Name: Angela Franco MRN: [id number] DOB: [birth year] Today's Date: 09/29/2016    History of Present Illness Pt is an 82 y/o female who presents s/p fall at ALF. Pt sustained R orbital fracture and a SDH with no surgical intervention.    Clinical Impression   Pt admitted with the above diagnoses and presents with below problem list. Pt will benefit from continued acute OT to address the below listed deficits and maximize independence with basic ADLs prior to d/c to venue below. PTA pt was mod I with ADLs. Pt presents with decreased functional status compared to PT evaluation yesterday and confirmed by discussion with NT. Yesterday pt ambulated 40 feet min A with chair follow during PT. Today pt reporting increase in spasms in LLE with inability to control movement of LLE and new RLE weakness, "it (RLE) doesn't feel right." Pt currently +2 max A with knee block provided to stand using steady to transfer from recliner back to bed. Also discussed pt's performance with nurse and attending MD.        Follow Up Recommendations  SNF    Equipment Recommendations  Other (comment) (defer to next venue)    Recommendations for Other Services       Precautions / Restrictions Precautions Precautions: Fall Restrictions Weight Bearing Restrictions: No      Mobility Bed Mobility Overal bed mobility: Needs Assistance Bed Mobility: Sit to Supine     Supine to sit: Mod assist;+2 for physical assistance Sit to supine: Mod assist;+2 for physical assistance   General bed mobility comments: Assist to advance BLE onto bed. Min A to control descent of trunk posteriorly into supine.  With extra time and effort and blocking of both legs pt able to scoot back in bed sitting EOB in preparation for EOB>supine transfer.  Transfers Overall transfer level: Needs assistance   Transfers: Sit to/from Stand Sit to Stand: Max assist;+2 physical assistance          General transfer comment: Pt now with c/o RLE deficits including decreased strength "it(RLE) doesn't feel right".  LLE painful spasms and decreased coordination and strength. Steady uitilited with +2 assist, third person utilized to hold L foot in position on steady.     Balance Overall balance assessment: Needs assistance Sitting-balance support: Feet supported;Bilateral upper extremity supported Sitting balance-Leahy Scale: Fair     Standing balance support: Bilateral upper extremity supported;During functional activity Standing balance-Leahy Scale: Poor Standing balance comment: assist to position LLE prior to standing with steady providing block to both knees. Pt needing A to maintain balance on steady. Pt fearful and with LLE>RLE pain.                             ADL Overall ADL's : Needs assistance/impaired Eating/Feeding: Set up;Sitting   Grooming: Set up;Sitting   Upper Body Bathing: Maximal assistance;Sitting Upper Body Bathing Details (indicate cue type and reason): poor sitting balance due to spasms in LLE, BUE sitting Lower Body Bathing: +2 for physical assistance;Maximal assistance Lower Body Bathing Details (indicate cue type and reason): steady Upper Body Dressing : Maximal assistance;Sitting Upper Body Dressing Details (indicate cue type and reason): poor sitting balance due to spasms in LLE, BUE sitting Lower Body Dressing: Maximal assistance;+2 for physical assistance Lower Body Dressing Details (indicate cue type and reason): steady Toilet Transfer: +2 for physical assistance;Maximal assistance Toilet Transfer Details (indicate cue type and reason): steady Toileting- Clothing Manipulation  and Hygiene: Maximal assistance;+2 for physical assistance Toileting - Clothing Manipulation Details (indicate cue type and reason): steady       General ADL Comments: Pt with worsening performance with transfers this date compared to PT evaluation  yesterday. Steady utilized with an additional third person utilized to keep L foot in position on steady. Pt reporting high level of pain in LLE during transfer. Pt on bed pan sitting in recliner upon therapist arrival with nursing student present. transferred from chair to bed with use of bariatric steady.      Vision     Perception     Praxis      Pertinent Vitals/Pain Pain Assessment: Faces Faces Pain Scale: Hurts whole lot Pain Location: LLE>RLE with movement Pain Descriptors / Indicators: Discomfort;Grimacing;Moaning;Cramping Pain Intervention(s): Limited activity within patient's tolerance;Monitored during session;Repositioned     Hand Dominance Right   Extremity/Trunk Assessment Upper Extremity Assessment Upper Extremity Assessment: Generalized weakness   Lower Extremity Assessment Lower Extremity Assessment: Defer to PT evaluation   Cervical / Trunk Assessment Cervical / Trunk Assessment: Kyphotic   Communication Communication Communication: No difficulties   Cognition Arousal/Alertness: Awake/alert Behavior During Therapy: WFL for tasks assessed/performed Overall Cognitive Status: Within Functional Limits for tasks assessed                     General Comments    (P)  NT present during session and reports decreased ability to perform transfers and increased report of pain/spasms/coordination in BLE throughout the day. Spoke with nurse and attending MD regarding concerns with decrease in pt status.     Exercises       Shoulder Instructions      Home Living Family/patient expects to be discharged to:: Assisted living                             Home Equipment: Walker - 2 wheels;Grab bars - toilet;Shower seat - built in;Hand held shower head;Grab bars - tub/shower   Additional Comments: Uses O2 when sleeping      Prior Functioning/Environment Level of Independence: Independent with assistive device(s)        Comments: use of RW and  O2 when needed and at night; walks to dining hall and attends multiple events at ALF        OT Problem List: Impaired balance (sitting and/or standing);Decreased activity tolerance;Decreased strength;Decreased range of motion;Decreased coordination;Decreased knowledge of use of DME or AE;Decreased knowledge of precautions;Impaired sensation;Impaired tone;Pain (BLE)   OT Treatment/Interventions: Self-care/ADL training;Neuromuscular education;DME and/or AE instruction;Therapeutic activities;Patient/family education;Balance training    OT Goals(Current goals can be found in the care plan section) Acute Rehab OT Goals Patient Stated Goal: To SNF at d/c then back to ALF OT Goal Formulation: With patient Time For Goal Achievement: 10/13/16 Potential to Achieve Goals: Good ADL Goals Pt Will Perform Grooming: with modified independence;sitting Pt Will Perform Lower Body Bathing: with mod assist;sit to/from stand Pt Will Perform Lower Body Dressing: with mod assist;sit to/from stand Pt Will Transfer to Toilet: with mod assist;stand pivot transfer;bedside commode Pt Will Perform Toileting - Clothing Manipulation and hygiene: with mod assist;sit to/from stand Additional ADL Goal #1: Pt will complete bed mobility at min A level as precursor to OOB ADLs.   OT Frequency: Min 2X/week   Barriers to D/C:            Co-evaluation  End of Session Equipment Utilized During Treatment: Gait belt;Oxygen;Other (comment) (steady) Nurse Communication: Other (comment) (see general comments)  Activity Tolerance: Patient limited by pain;Other (comment) (spasms in LLE, new weakness in RLE) Patient left: in bed;with call bell/phone within reach;with bed alarm set;with SCD's reapplied   Time: 1401-1430 OT Time Calculation (min): 29 min Charges:  OT General Charges $OT Visit: 1 Procedure OT Evaluation $OT Eval Moderate Complexity: 1 Procedure OT Treatments $Self Care/Home Management :  8-22 mins G-Codes:    Hortencia Pilar 10-12-2016, 2:54 PM

## 2016-09-29 NOTE — Clinical Social Work Note (Signed)
Clinical Social Work Assessment  Patient Details  Name: Angela Franco MRN: [id number] Date of Birth: [birth year]  Date of referral:  09/29/16               Reason for consult:  Facility Placement                Permission sought to share information with:  Family Supports Permission granted to share information::  Yes, Verbal Permission Granted  Name::     Ricky Ala  Relationship::  daughter  Contact Information:  [phone]  Housing/Transportation Living arrangements for the past 2 months:  [place] of Information:  Patient, Adult Children Patient Interpreter Needed:  None Criminal Activity/Legal Involvement Pertinent to Current Situation/Hospitalization:  No - Comment as needed Significant Relationships:  Adult Children, Other Family Members Lives with:  Facility Resident Do you feel safe going back to the place where you live?  No (pt needs a higher level of care prior to returning home to ALF) Need for family participation in patient care:  No (Coment)  Care giving concerns:  No care giving concerns identified  Facilities manager / plan:  CSW met with pt and pt's daughter to address consult. CSW introduced herself and explained role of social work. CSW also explained process of discharging to SNF for STR, as recommended by PT. CSW explained process of discharging to SNF for STR. Pt lives at [facility]F and is in need of a higher level of care. CSW was updated by [facility] Admissions Coordinator, and there are no medicare certified beds available at the SNF, however pt is able to go over to [facility]. Pt and family are aware and agreeable to this. CSW also clarifed questions pt's daughter has around Palliative Care vs. Hospice. CSW explained that pt is unable to have both STR and Hospice services. Pt's daughter agrees with STR. Pt is established with a Palliative Care NP following at the ALF, this will continue. CSW  clarified this plan with Palliative Care DO.   Per MD, pt may be ready for dischareg on Sat. Facility is aware. CSW also updated pt's family. CSW will continue to follow.   Employment status:  Retired Forensic scientist:  Medicare PT Recommendations:  [place] / Referral to community resources:  Other (Comment Required) ([facility] and [place])  Patient/Family's Response to care:  Pt and family were appreciative of CSW support.   Patient/Family's Understanding of and Emotional Response to Diagnosis, Current Treatment, and Prognosis:  Pt and family are aware of pt benefiting from STR prior to entering [place].   Emotional Assessment Appearance:  Appears stated age Attitude/Demeanor/Rapport:  Other (Approrpiate) Affect (typically observed):  Accepting, Adaptable, Pleasant Orientation:  Oriented to Self, Oriented to Situation, Oriented to  Time, Oriented to Place Alcohol / Substance use:  Never Used Psych involvement (Current and /or in the community):  No (Comment)  Discharge Needs  Concerns to be addressed:  Adjustment to Illness Readmission within the last 30 days:  No Current discharge risk:  Chronically ill Barriers to Discharge:  Continued Medical Work up   [employer], LCSW 09/29/2016, 11:34 AM

## 2016-09-29 NOTE — Progress Notes (Signed)
Patient ID: Angela Franco, female   DOB: [birth year], 82 y.o.   MRN: [id number]   LOS: 2 days   Subjective: C/o left leg spasms, no prior hx/o, otherwise ok   Objective: Vital signs in last 24 hours: Temp:  [97.8 F (36.6 C)-98.8 F (37.1 C)] 98 F (36.7 C) (10/27 0607) Pulse Rate:  [65-97] 97 (10/27 0607) Resp:  [16-18] 16 (10/27 0607) BP: (98-128)/(32-85) 128/85 (10/27 0607) SpO2:  [90 %-99 %] 99 % (10/27 0607) Last BM Date: 09/29/16   Laboratory  CBC  Recent Labs  09/28/16 0434 09/29/16 0442  WBC 6.2 5.5  HGB 8.1* 7.9*  HCT 24.3* 24.6*  PLT 232 164   BMET  Recent Labs  09/28/16 0434 09/29/16 0442  NA 132* 130*  K 3.5 3.8  CL 91* 91*  CO2 30 30  GLUCOSE 82 95  BUN 26* 28*  CREATININE 1.79* 1.98*  CALCIUM 9.2 9.3    Physical Exam General appearance: alert and no distress Resp: clear to auscultation bilaterally Cardio: regular rate and rhythm Ext: LLE WNL   Assessment/Plan: Fall R orbit FX R SDH - exam stable, rec PT/OT Left leg spasms -- Will check lytes but leave treatment to primary service Hx metastatic adenocarcinoma Medical issues - per primary service DNR  Trauma will sign off. Please call with questions.    Lisette Abu, PA-C Pager: [phone] General Trauma PA Pager: [phone]  09/29/2016

## 2016-09-29 NOTE — Progress Notes (Signed)
PROGRESS NOTE                                                                                                                                                                                                             Patient Demographics:    Angela Franco, is a 82 y.o. female, DOB - [birth year], TE:[id number]  Admit date - 09/27/2016   Admitting Physician Rise Patience, MD  Outpatient Primary MD for the patient is Geoffery Lyons, MD  LOS - 2  Outpatient Specialists:none  Chief Complaint  Patient presents with  . Code Stroke       Brief Narrative   82 year old female with A. fib on anticoagulation, chronic systolic CHF, CK D and chronic anemia presented from assisted living after sustaining a fall with head CT showing right subdural hematoma and acute fracture of the right orbital floor. On-call neurosurgeon Dr. Arnoldo Morale was consulted by ED physician and recommended she did not need acute intervention. Trauma surgery consulted who also recommended no surgical intervention.   Subjective:    Patient complains of severe cramping in her left leg today. When evaluated by therapy today she was found to have increased weakness in her right leg and worsened cramping. During the afternoon she had difficulty moving her left and reported decreased sensation.   Assessment  & Plan :    Principal Problem:   Subdural hematoma (HCC) Conservative management per trauma and neurosurgery. Follow-up head CT today shows some improvement in the size of right-sided hematoma. Discontinued anticoagulation.  Active Problems: Bilateral lower extremity weakness and cramping Patient has increased weakness on her left leg on my exam and reports diminished sensation. She reported bruising her back when she fell. Repeat head CT showing some decrease in size of subdural hematoma. D/w neurohospitalist Dr Shon Hale, he reviewed the imaging and  suggested the weakness and cramping is from the hematoma. Recommended gabapentin and try some quinine. No need for reimaging or MRI of the back. Replenish potassium.  Right orbital floor lamina papyracea fracture Has preorbital hematoma. No visual symptoms. Outpatient ophthalmology evaluation.    Atrial fibrillation, chronic (HCC)   rate controlled on Coreg. Off anticoagulation.    Trigeminal neuralgia Continue Trileptal.  Trigeminal neuralgia - Continue Trileptal 75 mg twice daily    Non-ischemic cardiomyopathy  Chronic systolic congestive heart failure (HCC) - Compensated ,  EF of 40-45% - Lasix on hold due to worsening renal function    Dyslipidemia - Continue simvastatin 5 mg at bedtime    Hyponatremia - Sodium 127 on the admission, possibly related to subdural hematoma versus dehydration.some improvement with hydration.    Iron deficiency anemia / anemia of chronic kidney disease - Continue iron supplementation    Acute renal failure superimposed on chronic kidney disease stage III - Baseline creatinine 2 months ago was 1.47 - Likely elevated due to Lasix, on hold      Consultants:   General surgery  Neurosurgery  Procedures:    CT head and cervical spine    Code Status : DO NOT RESUSCITATE  Family Communication  : Daughter at bedside  Disposition Plan  : SNF possibly on Monday  Barriers For Discharge : Active symptoms  Consults  :   Neurology Trauma surgery Neurosurgery    DVT Prophylaxis  :  SCDs  Lab Results  Component Value Date   PLT 164 09/29/2016    Antibiotics  :    Anti-infectives    None        Objective:   Vitals:   09/29/16 0130 09/29/16 0607 09/29/16 0900 09/29/16 1442  BP: (!) 106/51 128/85 132/72 129/62  Pulse: 70 97 (!) 101 78  Resp: 16 16 20  (!) 22  Temp: 97.8 F (36.6 C) 98 F (36.7 C) 98.1 F (36.7 C) 98.4 F (36.9 C)  TempSrc: Axillary Oral Oral Oral  SpO2: 98% 99% 97% 99%  Weight:        Height:        Wt Readings from Last 3 Encounters:  09/28/16 61.1 kg (134 lb 11.2 oz)  08/22/16 61.2 kg (135 lb)  08/20/16 59 kg (130 lb)     Intake/Output Summary (Last 24 hours) at 09/29/16 1607 Last data filed at 09/29/16 0800  Gross per 24 hour  Intake                0 ml  Output              125 ml  Net             -125 ml     Physical Exam  Gen: not in distress HEENT:Right facial bruising, moist mucosa, supple neck, normal vision Chest: clear b/l, no added sounds CVS: S1 and S2 irregular, no murmurs rub or gallop GI: soft, NT, ND, BS+ Musculoskeletal: warm, no edema CNS: Alert and oriented, weakness in bilateral lower extremities    Data Review:    CBC  Recent Labs Lab 09/27/16 1806 09/27/16 1812 09/28/16 0434 09/29/16 0442  WBC 8.9  --  6.2 5.5  HGB 8.6* 9.2* 8.1* 7.9*  HCT 26.5* 27.0* 24.3* 24.6*  PLT 230  --  232 164  MCV 96.0  --  96.0 98.8  MCH 31.2  --  32.0 31.7  MCHC 32.5  --  33.3 32.1  RDW 16.6*  --  16.6* 17.1*  LYMPHSABS 2.0  --   --   --   MONOABS 1.0  --   --   --   EOSABS 0.1  --   --   --   BASOSABS 0.0  --   --   --     Chemistries   Recent Labs Lab 09/27/16 1806 09/27/16 1812 09/28/16 0434 09/29/16 0442 09/29/16 1031  NA 128* 127* 132* 130*  --   K 3.6 3.5 3.5 3.8  --   CL  89* 92* 91* 91*  --   CO2 26  --  30 30  --   GLUCOSE 95 95 82 95  --   BUN 32* 31* 26* 28*  --   CREATININE 2.08* 2.20* 1.79* 1.98*  --   CALCIUM 9.7  --  9.2 9.3  --   MG  --   --   --   --  2.3  AST 29  --   --   --   --   ALT 13*  --   --   --   --   ALKPHOS 73  --   --   --   --   BILITOT 0.5  --   --   --   --    ------------------------------------------------------------------------------------------------------------------ No results for input(s): CHOL, HDL, LDLCALC, TRIG, CHOLHDL, LDLDIRECT in the last 72 hours.  No results found for:  HGBA1C ------------------------------------------------------------------------------------------------------------------ No results for input(s): TSH, T4TOTAL, T3FREE, THYROIDAB in the last 72 hours.  Invalid input(s): FREET3 ------------------------------------------------------------------------------------------------------------------ No results for input(s): VITAMINB12, FOLATE, FERRITIN, TIBC, IRON, RETICCTPCT in the last 72 hours.  Coagulation profile  Recent Labs Lab 09/27/16 1806  INR 1.34    No results for input(s): DDIMER in the last 72 hours.  Cardiac Enzymes No results for input(s): CKMB, TROPONINI, MYOGLOBIN in the last 168 hours.  Invalid input(s): CK ------------------------------------------------------------------------------------------------------------------    Component Value Date/Time   BNP 48.2 02/28/2016 1020   BNP 92.0 11/08/2015 0900    Inpatient Medications  Scheduled Meds: . acetaminophen  1,000 mg Oral TID  . allopurinol  200 mg Oral Daily  . baclofen  10 mg Oral TID  . calcium-vitamin D  1 tablet Oral Q breakfast  . carvedilol  6.25 mg Oral BID WC  . ferrous sulfate  325 mg Oral Q breakfast  . gabapentin  600 mg Oral TID  . guaiFENesin  600 mg Oral BID  . OXcarbazepine  75 mg Oral BID  . potassium chloride SA  20 mEq Oral Daily  . senna-docusate  1 tablet Oral BID  . simvastatin  5 mg Oral q1800  . zolpidem  5 mg Oral QHS   Continuous Infusions:  PRN Meds:.acetaminophen **OR** acetaminophen, bisacodyl, ipratropium-albuterol, morphine, morphine injection, ondansetron **OR** ondansetron (ZOFRAN) IV  Micro Results No results found for this or any previous visit (from the past 240 hour(s)).  Radiology Reports Dg Chest 1 View  Result Date: 09/27/2016 CLINICAL DATA:  Fall today with right-sided chest pain, initial encounter EXAM: CHEST 1 VIEW COMPARISON:  09/22/2016 FINDINGS: Cardiac shadow remains enlarged. The lungs are well aerated  bilaterally without focal infiltrate or sizable effusion. No acute bony abnormality is seen. IMPRESSION: No acute abnormality noted. Electronically Signed   By: Inez Catalina M.D.   On: 09/27/2016 21:30   Ct Head Wo Contrast  Result Date: 09/29/2016 CLINICAL DATA:  Parafalcine and tentorial hematoma with right-sided weakness after fall. EXAM: CT HEAD WITHOUT CONTRAST TECHNIQUE: Contiguous axial images were obtained from the base of the skull through the vertex without intravenous contrast. COMPARISON:  09/28/2016 and dating back through 09/27/2016 FINDINGS: Brain: Slightly smaller appearing right parafalcine and tentorial subdural hematoma measuring up to 13 mm in thickness on current exam on the axial study, series 201, image 20. No acute large vascular territory infarction. No insular ribbon sign. Mild superficial atrophy. Vascular: No hyperdense vessel or unexpected calcification. Skull: Normal. Negative for fracture or focal lesion. Sinuses/Orbits: Air-fluid level in the partially visualized right maxillary sinus, smaller  in volume than the previously noted. The ethmoid, sphenoid and visualized left maxillary sinus appear clear. No mastoid effusions. Orbits appear symmetric without acute abnormality. Other: None IMPRESSION: Slightly smaller right parafalcine and tentorial subdural hematoma measuring 13 mm versus 16 mm initially in thickness. No acute large vascular territory infarction. Chronic small vessel ischemic disease of periventricular white matter. Electronically Signed   By: Ashley Royalty M.D.   On: 09/29/2016 15:31   Ct Head Wo Contrast  Result Date: 09/28/2016 CLINICAL DATA:  82 year old female follow-up subdural hematoma Subsequent encounter. EXAM: CT HEAD WITHOUT CONTRAST TECHNIQUE: Contiguous axial images were obtained from the base of the skull through the vertex without intravenous contrast. COMPARISON:  09/27/2016. FINDINGS: Brain: Large right parafalcine subdural hematoma with extension  along the right tentorium similar to prior exam. Maximal thickness 1.6 cm. Tiny anterior left parafalcine without change. Artifact inferior temporal lobes bilaterally suspected. No CT evidence of large acute infarct. Chronic microvascular changes. Mild global atrophy without hydrocephalus. Vascular: Vascular calcifications. Skull: No skull fracture. Sinuses/Orbits: Right orbital floor and lamina papyracea fracture. Pre orbital hematoma. Opacification right maxillary sinus. Other: Negative IMPRESSION: Large right parafalcine subdural hematoma with extension along the right tentorium similar to prior exam. Maximal thickness 1.6 cm. Tiny anterior left parafalcine without change. Right orbital floor and lamina papyracea fracture. Pre orbital hematoma. Opacification right maxillary sinus. Electronically Signed   By: Genia Del M.D.   On: 09/28/2016 07:59   Ct Cervical Spine Wo Contrast  Result Date: 09/27/2016 CLINICAL DATA:  Patient fell at around 11 a.m. this morning with numbness in her left leg. EXAM: CT CERVICAL SPINE WITHOUT CONTRAST TECHNIQUE: Multidetector CT imaging of the cervical spine was performed without intravenous contrast. Multiplanar CT image reconstructions were also generated. COMPARISON:  None. FINDINGS: Alignment: Normal. Skull base and vertebrae: Intact. Craniocervical relationship is maintained. Soft tissues and spinal canal: No prevertebral soft tissue swelling. No significant canal stenosis. Low moderate right C3-4, C5-6 and mild C6-7 neural foraminal encroachment osteophytes. Mild left C3-4 and C5-6 neural foraminal narrowing from osteophytes. Disc levels: Degenerative disc space narrowing at C5-6 and C6-7 with small posterior marginal osteophytes. Bilateral uncovertebral osteoarthritis C3-4, C4-5, C5-6 and right C6-7. Upper chest: Pleural-parenchymal scarring bilaterally. No dominant mass seen. Other: Mastoids are aerated bilaterally. Bilateral extracranial carotid arteriosclerosis.  IMPRESSION: Multilevel degenerative facet arthropathy. Degenerative disc disease C5-6 and C6-7. No acute cervical spine osseous abnormality. Electronically Signed   By: Ashley Royalty M.D.   On: 09/27/2016 18:41   Ct Chest High Resolution  Result Date: 09/22/2016 CLINICAL DATA:  82 year old female with history of cough and recurrent pneumonia. Evaluate for interstitial lung disease. EXAM: CT CHEST WITHOUT CONTRAST TECHNIQUE: Multidetector CT imaging of the chest was performed following the standard protocol without intravenous contrast. High resolution imaging of the lungs, as well as inspiratory and expiratory imaging, was performed. COMPARISON:  High-resolution chest CT 04/14/2016. FINDINGS: Cardiovascular: Heart size is enlarged with right atrial dilatation. There is no significant pericardial fluid, thickening or pericardial calcification. There is aortic atherosclerosis, as well as atherosclerosis of the great vessels of the mediastinum and the coronary arteries, including calcified atherosclerotic plaque in the left anterior descending and left circumflex coronary arteries. Calcifications of the aortic valve and mitral annulus. Pulmonic trunk is mildly dilated measuring 3.6 cm in diameter. Mediastinum/Nodes: No pathologically enlarged mediastinal or hilar lymph nodes. Please note that accurate exclusion of hilar adenopathy is limited on noncontrast CT scans. Esophagus is unremarkable in appearance. Lungs/Pleura: High-resolution images demonstrates a remarkable  mosaic pattern of attenuation throughout the lungs bilaterally with extensive areas of lucency interposed with areas of ground-glass attenuation. Throughout the lucent areas, the pulmonary arteries appear diminutive; imaging findings concerning for underlying chronic pulmonary thromboembolism. Inspiratory and expiratory imaging clearly demonstrates air trapping, which appears to correspond to the pre-existing areas of lucency, indicative of concurrent  small airways disease. Expiratory images also demonstrate profound collapse of the trachea and mainstem bronchi, indicative of tracheobronchomalacia. There are no areas of significant subpleural reticulation, parenchymal banding, traction bronchiectasis or frank honeycombing. Bilateral apical pleural parenchymal thickening, most compatible with chronic post infectious or inflammatory scarring is noted. Upper Abdomen: Retroperitoneal lymphadenopathy is again noted, with the largest lymph node measuring up to 18 mm in short axis in the left para-aortic nodal station adjacent to the left renal hilum, similar to prior study 05/05/2016. Aortic atherosclerosis. Musculoskeletal: There are no aggressive appearing lytic or blastic lesions noted in the visualized portions of the skeleton. IMPRESSION: 1. There is no evidence to suggest interstitial lung disease. However, there are imaging findings which are very concerning for chronic pulmonary thromboembolic disease, as discussed above. 2. In addition, there is evidence of extensive air trapping from small airways disease, and there is severe tracheobronchomalacia. 3. Cardiomegaly with severe dilatation of the right atrium. 4. Dilatation of the pulmonic trunk (3.6 cm in diameter), suggestive of pulmonary arterial hypertension. 5. Aortic atherosclerosis, in addition to 2 vessel coronary artery disease. 6. There are calcifications of the aortic valve and mitral annulus. Echocardiographic correlation for evaluation of potential valvular dysfunction may be warranted if clinically indicated. 7. Upper retroperitoneal lymphadenopathy similar to prior examination 07/18/2016 and 05/05/2016, previously biopsy proven to reflect metastatic disease from unknown adenocarcinoma. Electronically Signed   By: Vinnie Langton M.D.   On: 09/22/2016 15:21   Ct Head Code Stroke W/o Cm  Result Date: 09/27/2016 CLINICAL DATA:  Code stroke.  Flaccid left leg. EXAM: CT HEAD WITHOUT CONTRAST  TECHNIQUE: Contiguous axial images were obtained from the base of the skull through the vertex without intravenous contrast. COMPARISON:  Prior MRI from 08/25/2012. FINDINGS: Brain: Acute right parafalcine subdural hematoma with extension along the tentorium. Hematoma measures up to 16 mm in greatest diameter. No associated mass effect. And spinal 9 knee discal about its off each see slow as of distance for/with from the [place] IIA well bone left neck I do not see anything else lobe status stroke from on 5/air off palm of the cisterns supper all by her palm entity down eyelids most extensive in facial fractures of small a a a a No other acute intracranial hemorrhage. Generalized cerebral atrophy with mild chronic microvascular ischemic disease. No other evidence for acute large vessel territory infarct. Small remote left cerebellar infarct noted. No mass lesion, midline shift, or mass effect. No hydrocephalus. Vascular: No hyperdense vessel. Scattered vascular calcifications present within the carotid siphons. Skull: Right periorbital and facial contusion. Scalp soft tissues otherwise unremarkable. There is associated right orbital floor fracture. Probable extension into the right lamina papyracea. Calvarium otherwise intact. Sinuses/Orbits: Right periorbital contusion. Globes grossly intact. There is associated right orbital floor fracture. Blood within the right maxillary sinus. No acute injury about the left globe and orbit. Mastoid air cells clear. ASPECTS [facility] Stroke Program Early CT Score) - Ganglionic level infarction (caudate, lentiform nuclei, internal capsule, insula, M1-M3 cortex): 7 - Supraganglionic infarction (M4-M6 cortex): 3 Total score (0-10 with 10 being normal): 10 IMPRESSION: 1. Acute right parafalcine subdural hematoma with extension along the right tentorium.  No significant mass effect. 2. No other evidence for acute intracranial infarct or other process identified. 3. ASPECTS is 10 4. Right  periorbital contusion with associated right orbital fractures. Further evaluation with dedicated maxillofacial CT recommended. 5. Mild atrophy with chronic microvascular ischemic disease. Small remote left cerebellar infarct. Critical Value/emergent results were called by telephone at the time of interpretation on 09/27/2016 at 6:31 pm to Dr. Shon Hale , who verbally acknowledged these results. Electronically Signed   By: Jeannine Boga M.D.   On: 09/27/2016 18:34   Ct Maxillofacial Wo Contrast  Result Date: 09/27/2016 CLINICAL DATA:  Initial evaluation for acute trauma, fall. EXAM: CT MAXILLOFACIAL WITHOUT CONTRAST TECHNIQUE: Multidetector CT imaging of the maxillofacial structures was performed. Multiplanar CT image reconstructions were also generated. A small metallic BB was placed on the right temple in order to reliably differentiate right from left. COMPARISON:  Prior head CT from earlier the same day. FINDINGS: Osseous: Zygomatic arches intact. Possible the faint acute nondisplaced fracture through the posterior wall of the right maxillary sinus. No other acute maxillary fracture. Pterygoid plates intact. Nasal bones intact. Nasal septum midline and intact. Mandible intact. Mandibular condyles normally situated. No acute abnormality about the dentition. Orbits: There is an acute trapped nor tight right orbital floor fracture. Fracture is at the level of the right infraorbital foramen. Approximately 4 mm of superior displayed placement of the fracture fragment into the bony right orbit. Adjacent soft tissue stranding with few foci of gas. Right inferior rectus muscle remains normally positioned. Subtle associated fracture of the inferior right lamina for pre shift. Right orbital roof and lateral wall of the bony right orbit intact. Right globe intact. No frank retro-orbital hematoma. No acute osseous abnormality about the left orbit. Left globe intact without retro-orbital pathology. Sinuses: Right  maxillary sinus is essentially completely opacified with blood and fluid within the lumen. Mild scattered mucosal thickening within the ethmoidal air cells. Paranasal sinuses are otherwise clear. No mastoid effusion. Soft tissues: Prominent right periorbital and facial contusion. Limited intracranial: Right-sided subdural hematoma, better evaluated on prior head CT. IMPRESSION: 1. Acute right orbital floor fracture at the level of the right infraorbital foramen. 4 mm of superior displacement of the main fracture fragment which closely approximates the right inferior rectus muscle. Intact globes without frank retro-orbital hematoma. 2. Associated subtle acute fracture of the right lamina papyracea. Orbital floor fracture also likely extends through the posterior wall of the right maxillary sinus as well. 3. Right periorbital and facial contusion. 4. Hemo sinus involving the right maxillary sinus. 5. Acute right-sided subdural hematoma, better evaluated on prior head CT. Electronically Signed   By: Jeannine Boga M.D.   On: 09/27/2016 20:15    Time Spent in minutes  35   Louellen Molder M.D on 09/29/2016 at 4:07 PM  Between 7am to 7pm - Pager - [phone]  After 7pm go to [email or web address] - password [facility]  [facility] -  Office  [phone]

## 2016-09-29 NOTE — Progress Notes (Signed)
Patient having severe spasms in the left leg.  Also having some spasms in the left arm.Patient's heart rate increases with spasms. Patient reports feeling "bad" and "like something is wrong". Md and day-shift RN notified. Awaiting call/orders from MD.

## 2016-09-29 NOTE — Clinical Social Work Placement (Signed)
   CLINICAL SOCIAL WORK PLACEMENT  NOTE  Date:  09/29/2016  Patient Details  Name: Analysia Amar MRN: [id number] Date of Birth: September 05, 1929  Clinical Social Work is seeking post-discharge placement for this patient at the [place] level of care (*CSW will initial, date and re-position this form in  chart as items are completed):  Yes   Patient/family provided with [facility] Work Department's list of facilities offering this level of care within the geographic area requested by the patient (or if unable, by the patient's family).  Yes   Patient/family informed of their freedom to choose among providers that offer the needed level of care, that participate in Medicare, Medicaid or managed care program needed by the patient, have an available bed and are willing to accept the patient.  Yes   Patient/family informed of [facility]'s ownership interest in [facility] and [facility], as well as of the fact that they are under no obligation to receive care at these facilities.  PASRR submitted to EDS on       PASRR number received on       Existing PASRR number confirmed on 09/29/16     FL2 transmitted to all facilities in geographic area requested by pt/family on 09/29/16     FL2 transmitted to all facilities within larger geographic area on       Patient informed that his/her managed care company has contracts with or will negotiate with certain facilities, including the following:        Yes   Patient/family informed of bed offers received.  Patient chooses bed at [facility]     Physician recommends and patient chooses bed at      Patient to be transferred to [facility] on  .  Patient to be transferred to facility by       Patient family notified on   of transfer.  Name of family member notified:        PHYSICIAN       Additional Comment:    _______________________________________________ Darden Dates,  LCSW 09/29/2016, 11:32 AM

## 2016-09-30 ENCOUNTER — Inpatient Hospital Stay (HOSPITAL_COMMUNITY): Payer: Medicare Other

## 2016-09-30 DIAGNOSIS — R4701 Aphasia: Secondary | ICD-10-CM

## 2016-09-30 DIAGNOSIS — G9340 Encephalopathy, unspecified: Secondary | ICD-10-CM

## 2016-09-30 DIAGNOSIS — R531 Weakness: Secondary | ICD-10-CM | POA: Diagnosis present

## 2016-09-30 DIAGNOSIS — M6289 Other specified disorders of muscle: Secondary | ICD-10-CM

## 2016-09-30 DIAGNOSIS — Z515 Encounter for palliative care: Secondary | ICD-10-CM

## 2016-09-30 DIAGNOSIS — R001 Bradycardia, unspecified: Secondary | ICD-10-CM

## 2016-09-30 LAB — CBC
HCT: 25 % — ABNORMAL LOW (ref 36.0–46.0)
Hemoglobin: 8 g/dL — ABNORMAL LOW (ref 12.0–15.0)
MCH: 31.3 pg (ref 26.0–34.0)
MCHC: 32 g/dL (ref 30.0–36.0)
MCV: 97.7 fL (ref 78.0–100.0)
PLATELETS: 183 10*3/uL (ref 150–400)
RBC: 2.56 MIL/uL — ABNORMAL LOW (ref 3.87–5.11)
RDW: 16.9 % — AB (ref 11.5–15.5)
WBC: 7.1 10*3/uL (ref 4.0–10.5)

## 2016-09-30 LAB — BASIC METABOLIC PANEL
ANION GAP: 9 (ref 5–15)
BUN: 25 mg/dL — ABNORMAL HIGH (ref 6–20)
CALCIUM: 9.1 mg/dL (ref 8.9–10.3)
CO2: 27 mmol/L (ref 22–32)
CREATININE: 1.76 mg/dL — AB (ref 0.44–1.00)
Chloride: 95 mmol/L — ABNORMAL LOW (ref 101–111)
GFR calc Af Amer: 29 mL/min — ABNORMAL LOW (ref >60)
GFR, EST NON AFRICAN AMERICAN: 25 mL/min — AB (ref >60)
GLUCOSE: 103 mg/dL — AB (ref 65–99)
Potassium: 4.5 mmol/L (ref 3.5–5.1)
Sodium: 131 mmol/L — ABNORMAL LOW (ref 135–145)

## 2016-09-30 LAB — CALCIUM, IONIZED: CALCIUM, IONIZED, SERUM: 5.2 mg/dL (ref 4.5–5.6)

## 2016-09-30 LAB — GLUCOSE, CAPILLARY: Glucose-Capillary: 110 mg/dL — ABNORMAL HIGH (ref 65–99)

## 2016-09-30 MED ORDER — ACETAMINOPHEN 10 MG/ML IV SOLN
1000.0000 mg | Freq: Three times a day (TID) | INTRAVENOUS | Status: AC
  Administered 2016-09-30 – 2016-10-01 (×3): 1000 mg via INTRAVENOUS
  Filled 2016-09-30 (×3): qty 100

## 2016-09-30 MED ORDER — SODIUM CHLORIDE 0.9 % IV BOLUS (SEPSIS)
250.0000 mL | Freq: Once | INTRAVENOUS | Status: AC
  Administered 2016-09-30: 250 mL via INTRAVENOUS

## 2016-09-30 MED ORDER — LORAZEPAM 2 MG/ML IJ SOLN
1.0000 mg | Freq: Once | INTRAMUSCULAR | Status: AC
  Administered 2016-09-30: 1 mg via INTRAVENOUS
  Filled 2016-09-30: qty 1

## 2016-09-30 MED ORDER — SODIUM CHLORIDE 0.9 % IV SOLN
INTRAVENOUS | Status: AC
  Administered 2016-09-30 – 2016-10-02 (×2): via INTRAVENOUS

## 2016-09-30 MED ORDER — NALOXONE HCL 0.4 MG/ML IJ SOLN
0.4000 mg | Freq: Once | INTRAMUSCULAR | Status: AC
  Administered 2016-09-30: 0.4 mg via INTRAVENOUS
  Filled 2016-09-30: qty 1

## 2016-09-30 NOTE — Progress Notes (Signed)
Patient is now lethargic and difficult to arouse. When patient is awake she is not longer able to tell her name. Patient only repeats what you say to her.  Md on call notified.  Order given for 250 ml bolus of Normal Saline. Will continue to monitor patient.

## 2016-09-30 NOTE — Progress Notes (Addendum)
Patient is confused tonight. Was previously alert and oriented x 4. Now is alert and oriented to self only. Repeat CT of head done earlier today. Showed decrease in size of SDH. Baclofen started today for left leg muscle spasms. On call Md Donnal Debar) notified of patients confusion.  No new orders given. RN will continue to monitor.

## 2016-09-30 NOTE — Progress Notes (Signed)
Pt is still not responsive after Narcan administered. MD aware. Pt's family at bedside. Will continue to monitor.

## 2016-09-30 NOTE — Progress Notes (Signed)
Daily Progress Note   Patient Name: Angela Franco       Date: 09/30/2016 DOB: 09/28/1929  Age: 82 y.o. MRN#: [id number] Attending Physician: Louellen Molder, MD Primary Care Physician: Geoffery Lyons, MD Admit Date: 09/27/2016  Reason for Consultation/Follow-up: Establishing goals of care, Non pain symptom management, Pain control and Psychosocial/spiritual support  Subjective: Called to room to evaluate patient's pain by RN per pt's daughter's request. Per RN as well as chart review patient has had an acute change overnight. She was found unresponsive this morning, aphasic. She received Narcan and became somewhat more responsive but is still aphasic. When I arrived to the room her eyes are closed she is moaning continuously and going "oh, oh,". Facial grimacing is noticed. Also apparent is abnormal movements to both of her feet with the appearance of myoclonus.. No other abnormal movements or generalized movements resembling tonic clonic activity observed. Chart reviewed, potassium, calcium, magnesium within normal limits. Patient has been unable to take any of her usual oral medicines for trigeminal neuralgia such as Trileptal and gabapentin. Per family patient has taken oxycodone 5 mg when necessary as well as tramadol and Tylenol for pain. She has not had a flare of her trigeminal neuralgia in a while however she has sustained head injury with subdural hematoma and multiple bruising to her face and orbital fracture. I did discuss with family that this could retrigger an acute episode of pain related to her trigeminal neuralgia. Family is requesting an MRI to further evaluate acute change whether this is stroke worsening of her subdural hematoma or residual polypharmacy effects. I did discuss in  length with family if that if we are considering any aggressive or further workup, sedating medications could cloud the picture as well as the information that we receive. . They do seem to be struggling with this. One family member asked me "if her bleeding is worse can't they just bore a small hole in her head and this not be too invasive"? Discussed that any interventions within the cranium would be viewed as an aggressive measure and would have to be weighed against risks  in the setting of cancer of unknown primary, systolic heart failure and chronic kidney disease. Did speak to Dr. Clementeen Graham , Ativan 1 mg was ordered so that patient can participate an MRI. After IV administration  of Ativan she relaxed and stopped moaning and at this point was taken down for MRI.  Length of Stay: 3  Current Medications: Scheduled Meds:  . acetaminophen  1,000 mg Oral TID  . allopurinol  200 mg Oral Daily  . calcium-vitamin D  1 tablet Oral Q breakfast  . carvedilol  6.25 mg Oral BID WC  . ferrous sulfate  325 mg Oral Q breakfast  . gabapentin  600 mg Oral TID  . guaiFENesin  600 mg Oral BID  . OXcarbazepine  75 mg Oral BID  . potassium chloride SA  20 mEq Oral Daily  . senna-docusate  1 tablet Oral BID  . simvastatin  5 mg Oral q1800  . zolpidem  5 mg Oral QHS    Continuous Infusions:    PRN Meds: acetaminophen **OR** acetaminophen, bisacodyl, ipratropium-albuterol, morphine injection, ondansetron **OR** ondansetron (ZOFRAN) IV  Physical Exam  Constitutional:  Elderly female with extensive ecchymosis to face; appears in acute distress: moaning, grimacing, going "oh, oh, oh"  HENT:  Extensive ecchymoses to face  Pulmonary/Chest:  Mild increased work of breathing;   Neurological:  Not responding to voice or commands. Moaning, saying repetitively "oh, oh". Appears in pain as well as agitated. Abnormal movements to feet. No other movements noted to UE or trunk. ? myoclonus  Skin: Skin is warm.    Psychiatric:  agitated  Nursing note and vitals reviewed.           Vital Signs: BP (!) 149/80 (BP Location: Left Arm)   Pulse (!) 103   Temp 98.7 F (37.1 C) (Oral)   Resp 18   Ht 5\' 2"  (1.575 m)   Wt 61.1 kg (134 lb 11.2 oz)   SpO2 99%   BMI 24.64 kg/m  SpO2: SpO2: 99 % O2 Device: O2 Device: Nasal Cannula O2 Flow Rate: O2 Flow Rate (L/min): 2 L/min  Intake/output summary:  Intake/Output Summary (Last 24 hours) at 09/30/16 1542 Last data filed at 09/29/16 2205  Gross per 24 hour  Intake              240 ml  Output                0 ml  Net              240 ml   LBM: Last BM Date: 09/29/16 Baseline Weight: Weight: 61.6 kg (135 lb 12.9 oz) Most recent weight: Weight: 61.1 kg (134 lb 11.2 oz)       Palliative Assessment/Data:      Patient Active Problem List   Diagnosis Date Noted  . Palliative care patient 09/27/2016  . Subdural hematoma ([place]) 09/27/2016  . Right orbital fracture ([place]) 09/27/2016  . Sepsis ([place]) 07/19/2016  . Aspiration pneumonia ([place]) 07/19/2016  . Fever 07/19/2016  . Leukocytosis 07/19/2016  . Acute respiratory failure with hypoxia ([place]) 02/28/2016  . Acute renal failure superimposed on stage 3 chronic kidney disease ([place]) 02/28/2016  . Acute on chronic diastolic heart failure ([place])   . Acute on chronic systolic heart failure ([place])   . Respiratory syncytial virus (RSV) infection   . Chronic systolic heart failure ([place])   . COPD exacerbation ([place]) 01/21/2016  . Chronic renal impairment, stage 3 (moderate) 11/10/2015  . COPD by CXR 08/26/2012  . Non-ischemic cardiomyopathy EF improved from 30-35% to 40-45% by Echo in 07/2012 06/30/2012  . LBBB (left bundle branch block) 06/30/2012  . Chronic anticoagulation 06/30/2012  . HTN (hypertension) 06/30/2012  .  Systolic and diastolic CHF, acute on chronic,  06/29/2012  . Hypo-osmolality and hyponatremia 06/29/2012  . Normocytic anemia 06/29/2012  . Atrial fibrillation, chronic ([place]) 06/29/2012   . Trigeminal neuralgia 06/29/2012  . Moderate to severe pulmonary hypertension, PA pressure in 70s June 2013 06/29/2012    Palliative Care Assessment & Plan   Patient Profile: 82 year old female with A. fib on anticoagulation, chronic systolic CHF, CK D and chronic anemia presented from assisted living after sustaining a fall with head CT showing right subdural hematoma and acute fracture of the right orbital floor. On-call neurosurgeon Dr. Arnoldo Morale was consulted by ED physician and recommended she did not need acute intervention. Trauma surgery consulted who also recommended no surgical intervention. Patient on the evening of 09/29/2016 had an acute change where she exhibited decreased level looks consciousness as well as new aphasia. She now presents in acute distress and is moaning and appears in pain   Assessment: Patient does not follow commands. She is moaning, repetitively stating "oh, oh,". Facial grimacing as noted. Both of her feet are moving up and down on a fairly regular basis. Per chart review it appears as though when she was speaking she was complaining of cramping type of pain and she did receive a dose of quinine on 09/29/2016. Family is very distressed at the bedside about witnessingher distress but also want her to have the MRI  Recommendations/Plan:  Continue with MRI  When resulted would recommend reviewing with family. Palliative medicine will stop by later today to see if this is resulted to discuss again goals of care with family. The test does not resulted today we will follow-up on 10/01/2016  Given her chronic kidney disease would recommend injectable medicines of low-dose Dilaudid to treat her pain when this is deemed appropriate; perhaps 0.25 mg every 6 as needed  Also she is been without her Trileptal for her trigeminal neuralgia. She is on 75 mg twice daily. We could resume this with concentrate at 1.3 MLS twice daily. Unfortunately the gabapentin equivalent  dosing for solution would be 12 MLS but depending upon patient's level of consciousness she may be able to take liquid versus oral medications  Ativan was quite helpful as premedication to go for MRI in relieving her distress as well as abnormal movements noted to her feet. This could also be an option going forward tonight as we work through what this acute change is secondary to  Goals of Care and Additional Recommendations:  Limitations on Scope of Treatment: No Chemotherapy  Code Status:    Code Status Orders        Start     Ordered   09/27/16 2223  Do not attempt resuscitation (DNR)  Continuous    Question Answer Comment  In the event of cardiac or respiratory ARREST Do not call a "code blue"   In the event of cardiac or respiratory ARREST Do not perform Intubation, CPR, defibrillation or ACLS   In the event of cardiac or respiratory ARREST Use medication by any route, position, wound care, and other measures to relive pain and suffering. May use oxygen, suction and manual treatment of airway obstruction as needed for comfort.      09/27/16 2224    Code Status History    Date Active Date Inactive Code Status Order ID Comments User Context   09/27/2016  7:16 PM 09/27/2016 10:24 PM DNR [id number]  Acquanetta Chain, DO ED   07/20/2016  2:51 AM 07/22/2016  6:06 PM DNR [id number]  Lily Kocher, MD Inpatient   02/28/2016  1:01 PM 03/02/2016  6:31 PM DNR [id number]  Radene Gunning, NP ED   01/21/2016  5:18 AM 01/26/2016  6:01 PM DNR [id number]  Bethena Roys, MD ED   01/21/2016 12:35 AM 01/21/2016  5:18 AM DNR [id number]  Ivor Costa, MD ED   01/02/2016  5:26 PM 01/04/2016  5:45 PM DNR [id number]  Norman Herrlich, MD Inpatient   12/29/2015  5:08 PM 12/31/2015  7:38 PM DNR [id number]  Bethena Roys, MD ED   08/23/2012  8:55 PM 08/26/2012  8:57 PM DNR [id number]  Dianne Dun, NP Inpatient   06/29/2012  5:38 PM 07/08/2012  6:44 PM DNR [id number]  Tonny Branch, RN Inpatient    Advance  Directive Documentation   Flowsheet Row Most Recent Value  Type of Advance Directive  Out of facility DNR (pink MOST or yellow form)  Pre-existing out of facility DNR order (yellow form or pink MOST form)  Yellow form placed in chart (order not valid for inpatient use)  "MOST" Form in Place?  No data       Prognosis:   Unable to determine  Discharge Planning:  To Be Determined  Care plan was discussed with 1400  Thank you for allowing the Palliative Medicine Team to assist in the care of this patient.   Time In: 1500 Time Out: 1545 Total Time 45 min Prolonged Time Billed  no       Greater than 50%  of this time was spent counseling and coordinating care related to the above assessment and plan.  Dory Horn, NP  Please contact Palliative Medicine Team phone at [phone] for questions and concerns.

## 2016-09-30 NOTE — Progress Notes (Signed)
PROGRESS NOTE                                                                                                                                                                                                             Patient Demographics:    Angela Franco, is a 82 y.o. female, DOB - Aug 26, 1929, [id number]  Admit date - 09/27/2016   Admitting Physician Rise Patience, MD  Outpatient Primary MD for the patient is Geoffery Lyons, MD  LOS - 3  Outpatient Specialists:none  Chief Complaint  Patient presents with  . Code Stroke       Brief Narrative   82 year old female with A. fib on anticoagulation, chronic systolic CHF, CK D and chronic anemia presented from assisted living after sustaining a fall with head CT showing right subdural hematoma and acute fracture of the right orbital floor. On-call neurosurgeon Dr. Arnoldo Morale was consulted by ED physician and recommended she did not need acute intervention. Trauma surgery consulted who also recommended no surgical intervention.   Subjective:   Patient confused overnight. Was aphasic on my exam this morning. Given IV Narcan and waking up more however remains aphasic.   Assessment  & Plan :    Principal Problem:   Subdural hematoma (HCC) Conservative management per trauma and neurosurgery. Follow-up head CTOn 10/27 shows some improvement in the size of right-sided hematoma. Discontinued anticoagulation.  Active Problems: Acute encephalopathy Was confused overnight and aphasic this morning. Will obtain MRI of the brain to rule out possible stroke versus worsening of subdural hematoma. Also could be effective multiple medications (morphine, baclofen, Robaxin, quinine) Family medical where that even if it came out to be stroke or worsen subdural hematoma, treatment options would be limited. Keep nothing by mouth.  Bilateral lower extremity weakness and  cramping D/w neurohospitalist Dr Shon Hale, he reviewed the imaging and suggested the weakness and cramping is from the hematoma. Optimized medications. Replenish electrolytes.  Right orbital floor lamina papyracea fracture Has preorbital hematoma. No visual symptoms. Outpatient ophthalmology evaluation.    Atrial fibrillation, chronic (HCC)  Episodes of bradycardia on telemetry overnight. Hold medications.. Off anticoagulation.    Trigeminal neuralgia On Trileptal.      Non-ischemic cardiomyopathy  Chronic systolic congestive heart failure (HCC) - Compensated , EF of 40-45% - Lasix on hold due to worsening renal function  Dyslipidemia On statin.    Hyponatremia - Sodium 127 on the admission, possibly related to subdural hematoma versus dehydration.some improvement with hydration.    Iron deficiency anemia / anemia of chronic kidney disease On iron supplement.    Acute renal failure superimposed on chronic kidney disease stage III - Baseline creatinine 2 months ago was 1.47 - Likely elevated due to Lasix, on hold. Slowly improving.      Consultants:   General surgery  Neurosurgery  Procedures:    CT head and cervical spine  MRI brain ordered    Code Status : DO NOT RESUSCITATE  Family Communication  : Sons at bedside  Disposition Plan  : Plan for SNF on Monday if stable.  Barriers For Discharge : Active symptoms  Consults  :   Neurology Trauma surgery Neurosurgery    DVT Prophylaxis  :  SCDs  Lab Results  Component Value Date   PLT 183 09/30/2016    Antibiotics  :    Anti-infectives    Start     Dose/Rate Route Frequency Ordered Stop   09/29/16 1630  quiNINE (QUALAQUIN) capsule 324 mg  Status:  Discontinued     324 mg Oral Daily 09/29/16 1621 09/30/16 0927        Objective:   Vitals:   09/30/16 0424 09/30/16 0622 09/30/16 0905 09/30/16 1043  BP: (!) 106/46 133/65 (!) 148/68 (!) 143/17  Pulse: 99 74 82 80  Resp: 14 16 18 18    Temp: 97.9 F (36.6 C) 97.9 F (36.6 C) 97.7 F (36.5 C) 98.7 F (37.1 C)  TempSrc: Axillary Oral Axillary Oral  SpO2: 100% 100% 100% 100%  Weight:      Height:        Wt Readings from Last 3 Encounters:  09/28/16 61.1 kg (134 lb 11.2 oz)  08/22/16 61.2 kg (135 lb)  08/20/16 59 kg (130 lb)     Intake/Output Summary (Last 24 hours) at 09/30/16 1223 Last data filed at 09/29/16 2205  Gross per 24 hour  Intake              240 ml  Output                0 ml  Net              240 ml     Physical Exam  Gen: Aphasic, HEENT:Right facial bruising, moist mucosa, supple neck,  Chest: Coarse breath sounds bilaterally, no added sounds CVS: S1 and S2 irregular, no murmurs rub or gallop GI: soft, NT, ND, BS+ Musculoskeletal: warm, no edema CNS: Awake to commands but nonverbal today, unable to appreciate focal weakness.    Data Review:    CBC  Recent Labs Lab 09/27/16 1806 09/27/16 1812 09/28/16 0434 09/29/16 0442 09/30/16 0515  WBC 8.9  --  6.2 5.5 7.1  HGB 8.6* 9.2* 8.1* 7.9* 8.0*  HCT 26.5* 27.0* 24.3* 24.6* 25.0*  PLT 230  --  232 164 183  MCV 96.0  --  96.0 98.8 97.7  MCH 31.2  --  32.0 31.7 31.3  MCHC 32.5  --  33.3 32.1 32.0  RDW 16.6*  --  16.6* 17.1* 16.9*  LYMPHSABS 2.0  --   --   --   --   MONOABS 1.0  --   --   --   --   EOSABS 0.1  --   --   --   --   BASOSABS 0.0  --   --   --   --  Chemistries   Recent Labs Lab 09/27/16 1806 09/27/16 1812 09/28/16 0434 09/29/16 0442 09/29/16 1031 09/30/16 0515  NA 128* 127* 132* 130*  --  131*  K 3.6 3.5 3.5 3.8  --  4.5  CL 89* 92* 91* 91*  --  95*  CO2 26  --  30 30  --  27  GLUCOSE 95 95 82 95  --  103*  BUN 32* 31* 26* 28*  --  25*  CREATININE 2.08* 2.20* 1.79* 1.98*  --  1.76*  CALCIUM 9.7  --  9.2 9.3  --  9.1  MG  --   --   --   --  2.3  --   AST 29  --   --   --   --   --   ALT 13*  --   --   --   --   --   ALKPHOS 73  --   --   --   --   --   BILITOT 0.5  --   --   --   --   --     ------------------------------------------------------------------------------------------------------------------ No results for input(s): CHOL, HDL, LDLCALC, TRIG, CHOLHDL, LDLDIRECT in the last 72 hours.  No results found for: HGBA1C ------------------------------------------------------------------------------------------------------------------ No results for input(s): TSH, T4TOTAL, T3FREE, THYROIDAB in the last 72 hours.  Invalid input(s): FREET3 ------------------------------------------------------------------------------------------------------------------ No results for input(s): VITAMINB12, FOLATE, FERRITIN, TIBC, IRON, RETICCTPCT in the last 72 hours.  Coagulation profile  Recent Labs Lab 09/27/16 1806  INR 1.34    No results for input(s): DDIMER in the last 72 hours.  Cardiac Enzymes No results for input(s): CKMB, TROPONINI, MYOGLOBIN in the last 168 hours.  Invalid input(s): CK ------------------------------------------------------------------------------------------------------------------    Component Value Date/Time   BNP 48.2 02/28/2016 1020   BNP 92.0 11/08/2015 0900    Inpatient Medications  Scheduled Meds: . acetaminophen  1,000 mg Oral TID  . allopurinol  200 mg Oral Daily  . calcium-vitamin D  1 tablet Oral Q breakfast  . carvedilol  6.25 mg Oral BID WC  . ferrous sulfate  325 mg Oral Q breakfast  . gabapentin  600 mg Oral TID  . guaiFENesin  600 mg Oral BID  . OXcarbazepine  75 mg Oral BID  . potassium chloride SA  20 mEq Oral Daily  . senna-docusate  1 tablet Oral BID  . simvastatin  5 mg Oral q1800  . zolpidem  5 mg Oral QHS   Continuous Infusions:  PRN Meds:.acetaminophen **OR** acetaminophen, bisacodyl, ipratropium-albuterol, morphine injection, ondansetron **OR** ondansetron (ZOFRAN) IV  Micro Results No results found for this or any previous visit (from the past 240 hour(s)).  Radiology Reports Dg Chest 1 View  Result Date:  09/27/2016 CLINICAL DATA:  Fall today with right-sided chest pain, initial encounter EXAM: CHEST 1 VIEW COMPARISON:  09/22/2016 FINDINGS: Cardiac shadow remains enlarged. The lungs are well aerated bilaterally without focal infiltrate or sizable effusion. No acute bony abnormality is seen. IMPRESSION: No acute abnormality noted. Electronically Signed   By: Inez Catalina M.D.   On: 09/27/2016 21:30   Ct Head Wo Contrast  Result Date: 09/29/2016 CLINICAL DATA:  Parafalcine and tentorial hematoma with right-sided weakness after fall. EXAM: CT HEAD WITHOUT CONTRAST TECHNIQUE: Contiguous axial images were obtained from the base of the skull through the vertex without intravenous contrast. COMPARISON:  09/28/2016 and dating back through 09/27/2016 FINDINGS: Brain: Slightly smaller appearing right parafalcine and tentorial subdural hematoma measuring up to 13 mm  in thickness on current exam on the axial study, series 201, image 20. No acute large vascular territory infarction. No insular ribbon sign. Mild superficial atrophy. Vascular: No hyperdense vessel or unexpected calcification. Skull: Normal. Negative for fracture or focal lesion. Sinuses/Orbits: Air-fluid level in the partially visualized right maxillary sinus, smaller in volume than the previously noted. The ethmoid, sphenoid and visualized left maxillary sinus appear clear. No mastoid effusions. Orbits appear symmetric without acute abnormality. Other: None IMPRESSION: Slightly smaller right parafalcine and tentorial subdural hematoma measuring 13 mm versus 16 mm initially in thickness. No acute large vascular territory infarction. Chronic small vessel ischemic disease of periventricular white matter. Electronically Signed   By: Ashley Royalty M.D.   On: 09/29/2016 15:31   Ct Head Wo Contrast  Result Date: 09/28/2016 CLINICAL DATA:  82 year old female follow-up subdural hematoma Subsequent encounter. EXAM: CT HEAD WITHOUT CONTRAST TECHNIQUE: Contiguous  axial images were obtained from the base of the skull through the vertex without intravenous contrast. COMPARISON:  09/27/2016. FINDINGS: Brain: Large right parafalcine subdural hematoma with extension along the right tentorium similar to prior exam. Maximal thickness 1.6 cm. Tiny anterior left parafalcine without change. Artifact inferior temporal lobes bilaterally suspected. No CT evidence of large acute infarct. Chronic microvascular changes. Mild global atrophy without hydrocephalus. Vascular: Vascular calcifications. Skull: No skull fracture. Sinuses/Orbits: Right orbital floor and lamina papyracea fracture. Pre orbital hematoma. Opacification right maxillary sinus. Other: Negative IMPRESSION: Large right parafalcine subdural hematoma with extension along the right tentorium similar to prior exam. Maximal thickness 1.6 cm. Tiny anterior left parafalcine without change. Right orbital floor and lamina papyracea fracture. Pre orbital hematoma. Opacification right maxillary sinus. Electronically Signed   By: Genia Del M.D.   On: 09/28/2016 07:59   Ct Cervical Spine Wo Contrast  Result Date: 09/27/2016 CLINICAL DATA:  Patient fell at around 11 a.m. this morning with numbness in her left leg. EXAM: CT CERVICAL SPINE WITHOUT CONTRAST TECHNIQUE: Multidetector CT imaging of the cervical spine was performed without intravenous contrast. Multiplanar CT image reconstructions were also generated. COMPARISON:  None. FINDINGS: Alignment: Normal. Skull base and vertebrae: Intact. Craniocervical relationship is maintained. Soft tissues and spinal canal: No prevertebral soft tissue swelling. No significant canal stenosis. Low moderate right C3-4, C5-6 and mild C6-7 neural foraminal encroachment osteophytes. Mild left C3-4 and C5-6 neural foraminal narrowing from osteophytes. Disc levels: Degenerative disc space narrowing at C5-6 and C6-7 with small posterior marginal osteophytes. Bilateral uncovertebral osteoarthritis  C3-4, C4-5, C5-6 and right C6-7. Upper chest: Pleural-parenchymal scarring bilaterally. No dominant mass seen. Other: Mastoids are aerated bilaterally. Bilateral extracranial carotid arteriosclerosis. IMPRESSION: Multilevel degenerative facet arthropathy. Degenerative disc disease C5-6 and C6-7. No acute cervical spine osseous abnormality. Electronically Signed   By: Ashley Royalty M.D.   On: 09/27/2016 18:41   Ct Chest High Resolution  Result Date: 09/22/2016 CLINICAL DATA:  82 year old female with history of cough and recurrent pneumonia. Evaluate for interstitial lung disease. EXAM: CT CHEST WITHOUT CONTRAST TECHNIQUE: Multidetector CT imaging of the chest was performed following the standard protocol without intravenous contrast. High resolution imaging of the lungs, as well as inspiratory and expiratory imaging, was performed. COMPARISON:  High-resolution chest CT 04/14/2016. FINDINGS: Cardiovascular: Heart size is enlarged with right atrial dilatation. There is no significant pericardial fluid, thickening or pericardial calcification. There is aortic atherosclerosis, as well as atherosclerosis of the great vessels of the mediastinum and the coronary arteries, including calcified atherosclerotic plaque in the left anterior descending and left circumflex coronary arteries.  Calcifications of the aortic valve and mitral annulus. Pulmonic trunk is mildly dilated measuring 3.6 cm in diameter. Mediastinum/Nodes: No pathologically enlarged mediastinal or hilar lymph nodes. Please note that accurate exclusion of hilar adenopathy is limited on noncontrast CT scans. Esophagus is unremarkable in appearance. Lungs/Pleura: High-resolution images demonstrates a remarkable mosaic pattern of attenuation throughout the lungs bilaterally with extensive areas of lucency interposed with areas of ground-glass attenuation. Throughout the lucent areas, the pulmonary arteries appear diminutive; imaging findings concerning for  underlying chronic pulmonary thromboembolism. Inspiratory and expiratory imaging clearly demonstrates air trapping, which appears to correspond to the pre-existing areas of lucency, indicative of concurrent small airways disease. Expiratory images also demonstrate profound collapse of the trachea and mainstem bronchi, indicative of tracheobronchomalacia. There are no areas of significant subpleural reticulation, parenchymal banding, traction bronchiectasis or frank honeycombing. Bilateral apical pleural parenchymal thickening, most compatible with chronic post infectious or inflammatory scarring is noted. Upper Abdomen: Retroperitoneal lymphadenopathy is again noted, with the largest lymph node measuring up to 18 mm in short axis in the left para-aortic nodal station adjacent to the left renal hilum, similar to prior study 05/05/2016. Aortic atherosclerosis. Musculoskeletal: There are no aggressive appearing lytic or blastic lesions noted in the visualized portions of the skeleton. IMPRESSION: 1. There is no evidence to suggest interstitial lung disease. However, there are imaging findings which are very concerning for chronic pulmonary thromboembolic disease, as discussed above. 2. In addition, there is evidence of extensive air trapping from small airways disease, and there is severe tracheobronchomalacia. 3. Cardiomegaly with severe dilatation of the right atrium. 4. Dilatation of the pulmonic trunk (3.6 cm in diameter), suggestive of pulmonary arterial hypertension. 5. Aortic atherosclerosis, in addition to 2 vessel coronary artery disease. 6. There are calcifications of the aortic valve and mitral annulus. Echocardiographic correlation for evaluation of potential valvular dysfunction may be warranted if clinically indicated. 7. Upper retroperitoneal lymphadenopathy similar to prior examination 07/18/2016 and 05/05/2016, previously biopsy proven to reflect metastatic disease from unknown adenocarcinoma.  Electronically Signed   By: Vinnie Langton M.D.   On: 09/22/2016 15:21   Dg Chest Port 1 View  Result Date: 09/30/2016 CLINICAL DATA:  Encephalopathy.  COPD. EXAM: PORTABLE CHEST 1 VIEW COMPARISON:  09/27/2016 FINDINGS: The lungs are hyperinflated likely secondary to COPD. There is mild bilateral chronic interstitial thickening. There is no focal parenchymal opacity. There is no pleural effusion or pneumothorax. There is stable cardiomegaly. There is thoracic aortic atherosclerosis. The osseous structures are unremarkable. IMPRESSION: No active disease. Electronically Signed   By: Kathreen Devoid   On: 09/30/2016 09:54   Ct Head Code Stroke W/o Cm  Result Date: 09/27/2016 CLINICAL DATA:  Code stroke.  Flaccid left leg. EXAM: CT HEAD WITHOUT CONTRAST TECHNIQUE: Contiguous axial images were obtained from the base of the skull through the vertex without intravenous contrast. COMPARISON:  Prior MRI from 08/25/2012. FINDINGS: Brain: Acute right parafalcine subdural hematoma with extension along the tentorium. Hematoma measures up to 16 mm in greatest diameter. No associated mass effect. And spinal 9 knee discal about its off each see slow as of distance for/with from the Venturia IIA well bone left neck I do not see anything else lobe status stroke from on 5/air off palm of the cisterns supper all by her palm entity down eyelids most extensive in facial fractures of small a a a a No other acute intracranial hemorrhage. Generalized cerebral atrophy with mild chronic microvascular ischemic disease. No other evidence for acute large vessel territory  infarct. Small remote left cerebellar infarct noted. No mass lesion, midline shift, or mass effect. No hydrocephalus. Vascular: No hyperdense vessel. Scattered vascular calcifications present within the carotid siphons. Skull: Right periorbital and facial contusion. Scalp soft tissues otherwise unremarkable. There is associated right orbital floor fracture. Probable  extension into the right lamina papyracea. Calvarium otherwise intact. Sinuses/Orbits: Right periorbital contusion. Globes grossly intact. There is associated right orbital floor fracture. Blood within the right maxillary sinus. No acute injury about the left globe and orbit. Mastoid air cells clear. ASPECTS [facility] Stroke Program Early CT Score) - Ganglionic level infarction (caudate, lentiform nuclei, internal capsule, insula, M1-M3 cortex): 7 - Supraganglionic infarction (M4-M6 cortex): 3 Total score (0-10 with 10 being normal): 10 IMPRESSION: 1. Acute right parafalcine subdural hematoma with extension along the right tentorium. No significant mass effect. 2. No other evidence for acute intracranial infarct or other process identified. 3. ASPECTS is 10 4. Right periorbital contusion with associated right orbital fractures. Further evaluation with dedicated maxillofacial CT recommended. 5. Mild atrophy with chronic microvascular ischemic disease. Small remote left cerebellar infarct. Critical Value/emergent results were called by telephone at the time of interpretation on 09/27/2016 at 6:31 pm to Dr. Shon Hale , who verbally acknowledged these results. Electronically Signed   By: Jeannine Boga M.D.   On: 09/27/2016 18:34   Ct Maxillofacial Wo Contrast  Result Date: 09/27/2016 CLINICAL DATA:  Initial evaluation for acute trauma, fall. EXAM: CT MAXILLOFACIAL WITHOUT CONTRAST TECHNIQUE: Multidetector CT imaging of the maxillofacial structures was performed. Multiplanar CT image reconstructions were also generated. A small metallic BB was placed on the right temple in order to reliably differentiate right from left. COMPARISON:  Prior head CT from earlier the same day. FINDINGS: Osseous: Zygomatic arches intact. Possible the faint acute nondisplaced fracture through the posterior wall of the right maxillary sinus. No other acute maxillary fracture. Pterygoid plates intact. Nasal bones intact. Nasal septum  midline and intact. Mandible intact. Mandibular condyles normally situated. No acute abnormality about the dentition. Orbits: There is an acute trapped nor tight right orbital floor fracture. Fracture is at the level of the right infraorbital foramen. Approximately 4 mm of superior displayed placement of the fracture fragment into the bony right orbit. Adjacent soft tissue stranding with few foci of gas. Right inferior rectus muscle remains normally positioned. Subtle associated fracture of the inferior right lamina for pre shift. Right orbital roof and lateral wall of the bony right orbit intact. Right globe intact. No frank retro-orbital hematoma. No acute osseous abnormality about the left orbit. Left globe intact without retro-orbital pathology. Sinuses: Right maxillary sinus is essentially completely opacified with blood and fluid within the lumen. Mild scattered mucosal thickening within the ethmoidal air cells. Paranasal sinuses are otherwise clear. No mastoid effusion. Soft tissues: Prominent right periorbital and facial contusion. Limited intracranial: Right-sided subdural hematoma, better evaluated on prior head CT. IMPRESSION: 1. Acute right orbital floor fracture at the level of the right infraorbital foramen. 4 mm of superior displacement of the main fracture fragment which closely approximates the right inferior rectus muscle. Intact globes without frank retro-orbital hematoma. 2. Associated subtle acute fracture of the right lamina papyracea. Orbital floor fracture also likely extends through the posterior wall of the right maxillary sinus as well. 3. Right periorbital and facial contusion. 4. Hemo sinus involving the right maxillary sinus. 5. Acute right-sided subdural hematoma, better evaluated on prior head CT. Electronically Signed   By: Jeannine Boga M.D.   On: 09/27/2016 20:15  Time Spent in minutes  35   Louellen Molder M.D on 09/30/2016 at 12:23 PM  Between 7am to 7pm - Pager  - [phone]  After 7pm go to [email or web address] - password [facility]  [facility] -  Office  [phone]

## 2016-09-30 NOTE — Progress Notes (Signed)
Rapid response RN called to come and assess patient. Patient responds to pain and sternal rub but does not completely wake-up. Vitals stable. Will continue to monitor.

## 2016-09-30 NOTE — Progress Notes (Signed)
Patient  Lying with eyes closed moaning sounds, family at bedside requesting  IV tylenol for scheduled tylenol states dont want patient moved Provider made aware and order for IV tylenol given.

## 2016-09-30 NOTE — Progress Notes (Signed)
Pt not responding to questions while in room. Pt opens eyes sometimes when name stated but does not keep eyes open and seems unable to respond. Pt's behavior is a change from yesterday when the pt would respond to questions and verbalize her concerns. Pt keeps licking lips and appears thirsty and grunts. Pt's muscles spasm sparatically. Pt receiving O2 2 Liters via nasal cannula. Asked pt to state name or respond and tell me how she feels. Pt opens eyes sometime but has been unable to respond to any commands. MD notified. MD responded and stated will arrive in 10 minutes. RN will continue to monitor pt.

## 2016-09-30 NOTE — Progress Notes (Signed)
MD Dhungel arrived in room. MD prescribed one time dose of Narcan for pt. RN administered dose via IV. Will continue to monitor.

## 2016-10-01 DIAGNOSIS — R252 Cramp and spasm: Secondary | ICD-10-CM | POA: Diagnosis not present

## 2016-10-01 MED ORDER — SODIUM CHLORIDE 0.9 % IV SOLN
1.0000 mg/h | INTRAVENOUS | Status: DC
  Administered 2016-10-01: 1 mg/h via INTRAVENOUS
  Filled 2016-10-01: qty 10

## 2016-10-01 MED ORDER — LORAZEPAM 2 MG/ML IJ SOLN: 1.0000 mg | INTRAMUSCULAR | 0 refills | Status: AC | PRN

## 2016-10-01 MED ORDER — MORPHINE BOLUS VIA INFUSION
1.0000 mg | INTRAVENOUS | Status: DC | PRN
  Filled 2016-10-01: qty 1

## 2016-10-01 MED ORDER — METHOCARBAMOL 1000 MG/10ML IJ SOLN
500.0000 mg | Freq: Three times a day (TID) | INTRAVENOUS | Status: DC
  Administered 2016-10-01 – 2016-10-02 (×3): 500 mg via INTRAVENOUS
  Filled 2016-10-01 (×7): qty 5

## 2016-10-01 MED ORDER — LORAZEPAM 2 MG/ML IJ SOLN
1.0000 mg | INTRAMUSCULAR | Status: DC | PRN
  Administered 2016-10-01 – 2016-10-02 (×3): 1 mg via INTRAVENOUS
  Filled 2016-10-01 (×3): qty 1

## 2016-10-01 MED ORDER — SODIUM CHLORIDE 0.9 % IV SOLN: 1.0000 mg/h | INTRAVENOUS | 0 refills | Status: AC

## 2016-10-01 NOTE — Progress Notes (Addendum)
PROGRESS NOTE                                                                                                                                                                                                             Patient Demographics:    Angela Franco, is a 82 y.o. female, DOB - May 24, 1929, ZQ:[id number]  Admit date - 09/27/2016   Admitting Physician Rise Patience, MD  Outpatient Primary MD for the patient is Geoffery Lyons, MD  LOS - 4  Outpatient Specialists:none  Chief Complaint  Patient presents with  . Code Stroke       Brief Narrative   82 year old female with A. fib on anticoagulation, chronic systolic CHF, CK D and chronic anemia presented from assisted living after sustaining a fall with head CT showing right subdural hematoma and acute fracture of the right orbital floor. On-call neurosurgeon Dr. Arnoldo Morale was consulted by ED physician and recommended she did not need acute intervention. Trauma surgery consulted who also recommended no surgical intervention.   Subjective:   Patient remains confused, mostly nonverbal, restless with discomfort and? Pain.   Assessment  & Plan :    Principal Problem:   Subdural hematoma (HCC) Conservative management per trauma and neurosurgery. Follow-up head CT unchanged. Discontinued anticoagulation.  Active Problems: Acute encephalopathy Started since the night of 10/27. Poorly communicative. MRI done negative for stroke and unchanged size of subdural hematoma. Patient restless and crying out in pain. Discussed with patient's children at bedside. They wish her to be comfortable and alleviate her pain. Agree on making her comfort care with cool for possible residential hospice tomorrow if stable.  start on low-dose morphine drip and titrated. Scheduled Ativan for agitation. Added IV Robaxin for muscle spasms.  Bilateral lower extremity weakness and  cramping D/w neurohospitalist Dr Shon Hale, he reviewed the imaging and suggested the weakness and cramping is from the hematoma. Optimized medications. Replenish electrolytes.  Right orbital floor lamina papyracea fracture Has preorbital hematoma.     Atrial fibrillation, chronic (HCC)  Episodes of bradycardia during the night, mostly tachycardic. Off anticoagulation.    Trigeminal neuralgia Unable to take Trileptal due to encephalopathy.      Non-ischemic cardiomyopathy  Chronic systolic congestive heart failure (HCC) - Compensated , EF of 40-45% - Lasix as needed for dyspnea.    Dyslipidemia On statin.  Hyponatremia - Sodium 127 on the admission, possibly related to subdural hematoma versus dehydration.some improvement with hydration.    Iron deficiency anemia / anemia of chronic kidney disease     Acute renal failure superimposed on chronic kidney disease stage III - Baseline creatinine 2 months ago was 1.47 Lasix held.  Goals of care Palliative care following. Discussed with family at bedside. Given her worsening encephalopathy and progressive decline in function daily on keeping her comfort care. Agree with starting low-dose morphine drip, Ativan for anxiety. If stable over next 24 hours daily on residential hospice.    Consultants:   General surgery  Neurosurgery  Procedures:    CT head and cervical spine  MRI brain     Code Status : DO NOT RESUSCITATE, comfort measures  Family Communication  : Sons and daughter at bedside  Disposition Plan  :  residential hospice, SW consulted  Barriers For Discharge : Active symptoms  Consults  :   Neurology Trauma surgery Neurosurgery    DVT Prophylaxis  :  SCDs  Lab Results  Component Value Date   PLT 183 09/30/2016    Antibiotics  :    Anti-infectives    Start     Dose/Rate Route Frequency Ordered Stop   09/29/16 1630  quiNINE (QUALAQUIN) capsule 324 mg  Status:  Discontinued     324 mg  Oral Daily 09/29/16 1621 09/30/16 0927        Objective:   Vitals:   09/30/16 2318 10/01/16 0218 10/01/16 0300 10/01/16 0515  BP: (!) 130/56 (!) 104/37 (!) 117/41 (!) 143/68  Pulse: (!) 52 (!) 105  98  Resp: 16 17  16   Temp: 98.1 F (36.7 C) 98.2 F (36.8 C)  97.6 F (36.4 C)  TempSrc: Axillary Axillary  Axillary  SpO2: 97% 100%  99%  Weight:      Height:        Wt Readings from Last 3 Encounters:  09/28/16 61.1 kg (134 lb 11.2 oz)  08/22/16 61.2 kg (135 lb)  08/20/16 59 kg (130 lb)    No intake or output data in the 24 hours ending 10/01/16 1036   Physical Exam  Gen: Aphasic, moaning with discomfort HEENT:Right facial bruising,  Chest: Clear breath sounds bilaterally, no added sounds CVS: S1 and S2 irregular, no murmurs rub or gallop GI: soft, NT, ND, BS+ Foley placed Musculoskeletal: warm, no edema CNS: Responds occasionally with a yes to commands, mostly nonverbal, moaning with discomfort    Data Review:    CBC  Recent Labs Lab 09/27/16 1806 09/27/16 1812 09/28/16 0434 09/29/16 0442 09/30/16 0515  WBC 8.9  --  6.2 5.5 7.1  HGB 8.6* 9.2* 8.1* 7.9* 8.0*  HCT 26.5* 27.0* 24.3* 24.6* 25.0*  PLT 230  --  232 164 183  MCV 96.0  --  96.0 98.8 97.7  MCH 31.2  --  32.0 31.7 31.3  MCHC 32.5  --  33.3 32.1 32.0  RDW 16.6*  --  16.6* 17.1* 16.9*  LYMPHSABS 2.0  --   --   --   --   MONOABS 1.0  --   --   --   --   EOSABS 0.1  --   --   --   --   BASOSABS 0.0  --   --   --   --     Chemistries   Recent Labs Lab 09/27/16 1806 09/27/16 1812 09/28/16 0434 09/29/16 0442 09/29/16 1031 09/30/16 0515  NA  128* 127* 132* 130*  --  131*  K 3.6 3.5 3.5 3.8  --  4.5  CL 89* 92* 91* 91*  --  95*  CO2 26  --  30 30  --  27  GLUCOSE 95 95 82 95  --  103*  BUN 32* 31* 26* 28*  --  25*  CREATININE 2.08* 2.20* 1.79* 1.98*  --  1.76*  CALCIUM 9.7  --  9.2 9.3  --  9.1  MG  --   --   --   --  2.3  --   AST 29  --   --   --   --   --   ALT 13*  --   --   --   --    --   ALKPHOS 73  --   --   --   --   --   BILITOT 0.5  --   --   --   --   --    ------------------------------------------------------------------------------------------------------------------ No results for input(s): CHOL, HDL, LDLCALC, TRIG, CHOLHDL, LDLDIRECT in the last 72 hours.  No results found for: HGBA1C ------------------------------------------------------------------------------------------------------------------ No results for input(s): TSH, T4TOTAL, T3FREE, THYROIDAB in the last 72 hours.  Invalid input(s): FREET3 ------------------------------------------------------------------------------------------------------------------ No results for input(s): VITAMINB12, FOLATE, FERRITIN, TIBC, IRON, RETICCTPCT in the last 72 hours.  Coagulation profile  Recent Labs Lab 09/27/16 1806  INR 1.34    No results for input(s): DDIMER in the last 72 hours.  Cardiac Enzymes No results for input(s): CKMB, TROPONINI, MYOGLOBIN in the last 168 hours.  Invalid input(s): CK ------------------------------------------------------------------------------------------------------------------    Component Value Date/Time   BNP 48.2 02/28/2016 1020   BNP 92.0 11/08/2015 0900    Inpatient Medications  Scheduled Meds: . acetaminophen  1,000 mg Intravenous Q8H  . methocarbamol (ROBAXIN)  IV  500 mg Intravenous Q8H   Continuous Infusions: . sodium chloride 75 mL/hr at 09/30/16 1815  . morphine     PRN Meds:.acetaminophen **OR** acetaminophen, LORazepam, ondansetron **OR** ondansetron (ZOFRAN) IV  Micro Results No results found for this or any previous visit (from the past 240 hour(s)).  Radiology Reports Dg Chest 1 View  Result Date: 09/27/2016 CLINICAL DATA:  Fall today with right-sided chest pain, initial encounter EXAM: CHEST 1 VIEW COMPARISON:  09/22/2016 FINDINGS: Cardiac shadow remains enlarged. The lungs are well aerated bilaterally without focal infiltrate or  sizable effusion. No acute bony abnormality is seen. IMPRESSION: No acute abnormality noted. Electronically Signed   By: Inez Catalina M.D.   On: 09/27/2016 21:30   Ct Head Wo Contrast  Result Date: 09/29/2016 CLINICAL DATA:  Parafalcine and tentorial hematoma with right-sided weakness after fall. EXAM: CT HEAD WITHOUT CONTRAST TECHNIQUE: Contiguous axial images were obtained from the base of the skull through the vertex without intravenous contrast. COMPARISON:  09/28/2016 and dating back through 09/27/2016 FINDINGS: Brain: Slightly smaller appearing right parafalcine and tentorial subdural hematoma measuring up to 13 mm in thickness on current exam on the axial study, series 201, image 20. No acute large vascular territory infarction. No insular ribbon sign. Mild superficial atrophy. Vascular: No hyperdense vessel or unexpected calcification. Skull: Normal. Negative for fracture or focal lesion. Sinuses/Orbits: Air-fluid level in the partially visualized right maxillary sinus, smaller in volume than the previously noted. The ethmoid, sphenoid and visualized left maxillary sinus appear clear. No mastoid effusions. Orbits appear symmetric without acute abnormality. Other: None IMPRESSION: Slightly smaller right parafalcine and tentorial subdural hematoma measuring 13 mm versus 16  mm initially in thickness. No acute large vascular territory infarction. Chronic small vessel ischemic disease of periventricular white matter. Electronically Signed   By: Ashley Royalty M.D.   On: 09/29/2016 15:31   Ct Head Wo Contrast  Result Date: 09/28/2016 CLINICAL DATA:  82 year old female follow-up subdural hematoma Subsequent encounter. EXAM: CT HEAD WITHOUT CONTRAST TECHNIQUE: Contiguous axial images were obtained from the base of the skull through the vertex without intravenous contrast. COMPARISON:  09/27/2016. FINDINGS: Brain: Large right parafalcine subdural hematoma with extension along the right tentorium similar to  prior exam. Maximal thickness 1.6 cm. Tiny anterior left parafalcine without change. Artifact inferior temporal lobes bilaterally suspected. No CT evidence of large acute infarct. Chronic microvascular changes. Mild global atrophy without hydrocephalus. Vascular: Vascular calcifications. Skull: No skull fracture. Sinuses/Orbits: Right orbital floor and lamina papyracea fracture. Pre orbital hematoma. Opacification right maxillary sinus. Other: Negative IMPRESSION: Large right parafalcine subdural hematoma with extension along the right tentorium similar to prior exam. Maximal thickness 1.6 cm. Tiny anterior left parafalcine without change. Right orbital floor and lamina papyracea fracture. Pre orbital hematoma. Opacification right maxillary sinus. Electronically Signed   By: Genia Del M.D.   On: 09/28/2016 07:59   Ct Cervical Spine Wo Contrast  Result Date: 09/27/2016 CLINICAL DATA:  Patient fell at around 11 a.m. this morning with numbness in her left leg. EXAM: CT CERVICAL SPINE WITHOUT CONTRAST TECHNIQUE: Multidetector CT imaging of the cervical spine was performed without intravenous contrast. Multiplanar CT image reconstructions were also generated. COMPARISON:  None. FINDINGS: Alignment: Normal. Skull base and vertebrae: Intact. Craniocervical relationship is maintained. Soft tissues and spinal canal: No prevertebral soft tissue swelling. No significant canal stenosis. Low moderate right C3-4, C5-6 and mild C6-7 neural foraminal encroachment osteophytes. Mild left C3-4 and C5-6 neural foraminal narrowing from osteophytes. Disc levels: Degenerative disc space narrowing at C5-6 and C6-7 with small posterior marginal osteophytes. Bilateral uncovertebral osteoarthritis C3-4, C4-5, C5-6 and right C6-7. Upper chest: Pleural-parenchymal scarring bilaterally. No dominant mass seen. Other: Mastoids are aerated bilaterally. Bilateral extracranial carotid arteriosclerosis. IMPRESSION: Multilevel degenerative  facet arthropathy. Degenerative disc disease C5-6 and C6-7. No acute cervical spine osseous abnormality. Electronically Signed   By: Ashley Royalty M.D.   On: 09/27/2016 18:41   Mr Brain Wo Contrast  Result Date: 09/30/2016 CLINICAL DATA:  Aphasia. Intracranial hemorrhage. History of fall and subdural hematoma EXAM: MRI HEAD WITHOUT CONTRAST TECHNIQUE: Multiplanar, multiecho pulse sequences of the brain and surrounding structures were obtained without intravenous contrast. COMPARISON:  CT head 09/29/2016 FINDINGS: Brain: Posterior interhemispheric subdural hematoma on the right is unchanged in size measuring approximately 12 mm in thickness. This extends along the tentorium where there is a small amount of subdural hemorrhage. There is methemoglobin in both areas of hemorrhage. No shift of the midline structures. No evidence of subarachnoid hemorrhage. Generalized atrophy. Negative for hydrocephalus. Negative for acute infarct. Mild chronic microvascular ischemic change in the white matter. Negative for mass lesion. Vascular: Normal arterial flow voids. Skull and upper cervical spine: Negative Sinuses/Orbits: Mild mucosal edema in the paranasal sinuses. Mild mastoid sinus effusion bilaterally. Air-fluid level in the right maxillary sinus. Bilateral lens replacement. Other: None IMPRESSION: Posterior right interhemispheric subdural hematoma unchanged from recent CT. This extends along the right tentorium. No new hemorrhage since the prior CT Negative for acute infarct. Generalized atrophy.  Negative for hydrocephalus or air-fluid level Air-fluid level right maxillary sinus unchanged from the prior CT. Electronically Signed   By: Franchot Gallo M.D.  On: 09/30/2016 16:49   Ct Chest High Resolution  Result Date: 09/22/2016 CLINICAL DATA:  82 year old female with history of cough and recurrent pneumonia. Evaluate for interstitial lung disease. EXAM: CT CHEST WITHOUT CONTRAST TECHNIQUE: Multidetector CT imaging  of the chest was performed following the standard protocol without intravenous contrast. High resolution imaging of the lungs, as well as inspiratory and expiratory imaging, was performed. COMPARISON:  High-resolution chest CT 04/14/2016. FINDINGS: Cardiovascular: Heart size is enlarged with right atrial dilatation. There is no significant pericardial fluid, thickening or pericardial calcification. There is aortic atherosclerosis, as well as atherosclerosis of the great vessels of the mediastinum and the coronary arteries, including calcified atherosclerotic plaque in the left anterior descending and left circumflex coronary arteries. Calcifications of the aortic valve and mitral annulus. Pulmonic trunk is mildly dilated measuring 3.6 cm in diameter. Mediastinum/Nodes: No pathologically enlarged mediastinal or hilar lymph nodes. Please note that accurate exclusion of hilar adenopathy is limited on noncontrast CT scans. Esophagus is unremarkable in appearance. Lungs/Pleura: High-resolution images demonstrates a remarkable mosaic pattern of attenuation throughout the lungs bilaterally with extensive areas of lucency interposed with areas of ground-glass attenuation. Throughout the lucent areas, the pulmonary arteries appear diminutive; imaging findings concerning for underlying chronic pulmonary thromboembolism. Inspiratory and expiratory imaging clearly demonstrates air trapping, which appears to correspond to the pre-existing areas of lucency, indicative of concurrent small airways disease. Expiratory images also demonstrate profound collapse of the trachea and mainstem bronchi, indicative of tracheobronchomalacia. There are no areas of significant subpleural reticulation, parenchymal banding, traction bronchiectasis or frank honeycombing. Bilateral apical pleural parenchymal thickening, most compatible with chronic post infectious or inflammatory scarring is noted. Upper Abdomen: Retroperitoneal lymphadenopathy is  again noted, with the largest lymph node measuring up to 18 mm in short axis in the left para-aortic nodal station adjacent to the left renal hilum, similar to prior study 05/05/2016. Aortic atherosclerosis. Musculoskeletal: There are no aggressive appearing lytic or blastic lesions noted in the visualized portions of the skeleton. IMPRESSION: 1. There is no evidence to suggest interstitial lung disease. However, there are imaging findings which are very concerning for chronic pulmonary thromboembolic disease, as discussed above. 2. In addition, there is evidence of extensive air trapping from small airways disease, and there is severe tracheobronchomalacia. 3. Cardiomegaly with severe dilatation of the right atrium. 4. Dilatation of the pulmonic trunk (3.6 cm in diameter), suggestive of pulmonary arterial hypertension. 5. Aortic atherosclerosis, in addition to 2 vessel coronary artery disease. 6. There are calcifications of the aortic valve and mitral annulus. Echocardiographic correlation for evaluation of potential valvular dysfunction may be warranted if clinically indicated. 7. Upper retroperitoneal lymphadenopathy similar to prior examination 07/18/2016 and 05/05/2016, previously biopsy proven to reflect metastatic disease from unknown adenocarcinoma. Electronically Signed   By: Vinnie Langton M.D.   On: 09/22/2016 15:21   Dg Chest Port 1 View  Result Date: 09/30/2016 CLINICAL DATA:  Encephalopathy.  COPD. EXAM: PORTABLE CHEST 1 VIEW COMPARISON:  09/27/2016 FINDINGS: The lungs are hyperinflated likely secondary to COPD. There is mild bilateral chronic interstitial thickening. There is no focal parenchymal opacity. There is no pleural effusion or pneumothorax. There is stable cardiomegaly. There is thoracic aortic atherosclerosis. The osseous structures are unremarkable. IMPRESSION: No active disease. Electronically Signed   By: Kathreen Devoid   On: 09/30/2016 09:54   Ct Head Code Stroke W/o Cm  Result  Date: 09/27/2016 CLINICAL DATA:  Code stroke.  Flaccid left leg. EXAM: CT HEAD WITHOUT CONTRAST TECHNIQUE: Contiguous axial images  were obtained from the base of the skull through the vertex without intravenous contrast. COMPARISON:  Prior MRI from 08/25/2012. FINDINGS: Brain: Acute right parafalcine subdural hematoma with extension along the tentorium. Hematoma measures up to 16 mm in greatest diameter. No associated mass effect. And spinal 9 knee discal about its off each see slow as of distance for/with from the Harmony IIA well bone left neck I do not see anything else lobe status stroke from on 5/air off palm of the cisterns supper all by her palm entity down eyelids most extensive in facial fractures of small a a a a No other acute intracranial hemorrhage. Generalized cerebral atrophy with mild chronic microvascular ischemic disease. No other evidence for acute large vessel territory infarct. Small remote left cerebellar infarct noted. No mass lesion, midline shift, or mass effect. No hydrocephalus. Vascular: No hyperdense vessel. Scattered vascular calcifications present within the carotid siphons. Skull: Right periorbital and facial contusion. Scalp soft tissues otherwise unremarkable. There is associated right orbital floor fracture. Probable extension into the right lamina papyracea. Calvarium otherwise intact. Sinuses/Orbits: Right periorbital contusion. Globes grossly intact. There is associated right orbital floor fracture. Blood within the right maxillary sinus. No acute injury about the left globe and orbit. Mastoid air cells clear. ASPECTS [facility] Stroke Program Early CT Score) - Ganglionic level infarction (caudate, lentiform nuclei, internal capsule, insula, M1-M3 cortex): 7 - Supraganglionic infarction (M4-M6 cortex): 3 Total score (0-10 with 10 being normal): 10 IMPRESSION: 1. Acute right parafalcine subdural hematoma with extension along the right tentorium. No significant mass effect. 2. No other  evidence for acute intracranial infarct or other process identified. 3. ASPECTS is 10 4. Right periorbital contusion with associated right orbital fractures. Further evaluation with dedicated maxillofacial CT recommended. 5. Mild atrophy with chronic microvascular ischemic disease. Small remote left cerebellar infarct. Critical Value/emergent results were called by telephone at the time of interpretation on 09/27/2016 at 6:31 pm to Dr. Shon Hale , who verbally acknowledged these results. Electronically Signed   By: Jeannine Boga M.D.   On: 09/27/2016 18:34   Ct Maxillofacial Wo Contrast  Result Date: 09/27/2016 CLINICAL DATA:  Initial evaluation for acute trauma, fall. EXAM: CT MAXILLOFACIAL WITHOUT CONTRAST TECHNIQUE: Multidetector CT imaging of the maxillofacial structures was performed. Multiplanar CT image reconstructions were also generated. A small metallic BB was placed on the right temple in order to reliably differentiate right from left. COMPARISON:  Prior head CT from earlier the same day. FINDINGS: Osseous: Zygomatic arches intact. Possible the faint acute nondisplaced fracture through the posterior wall of the right maxillary sinus. No other acute maxillary fracture. Pterygoid plates intact. Nasal bones intact. Nasal septum midline and intact. Mandible intact. Mandibular condyles normally situated. No acute abnormality about the dentition. Orbits: There is an acute trapped nor tight right orbital floor fracture. Fracture is at the level of the right infraorbital foramen. Approximately 4 mm of superior displayed placement of the fracture fragment into the bony right orbit. Adjacent soft tissue stranding with few foci of gas. Right inferior rectus muscle remains normally positioned. Subtle associated fracture of the inferior right lamina for pre shift. Right orbital roof and lateral wall of the bony right orbit intact. Right globe intact. No frank retro-orbital hematoma. No acute osseous  abnormality about the left orbit. Left globe intact without retro-orbital pathology. Sinuses: Right maxillary sinus is essentially completely opacified with blood and fluid within the lumen. Mild scattered mucosal thickening within the ethmoidal air cells. Paranasal sinuses are otherwise clear. No mastoid  effusion. Soft tissues: Prominent right periorbital and facial contusion. Limited intracranial: Right-sided subdural hematoma, better evaluated on prior head CT. IMPRESSION: 1. Acute right orbital floor fracture at the level of the right infraorbital foramen. 4 mm of superior displacement of the main fracture fragment which closely approximates the right inferior rectus muscle. Intact globes without frank retro-orbital hematoma. 2. Associated subtle acute fracture of the right lamina papyracea. Orbital floor fracture also likely extends through the posterior wall of the right maxillary sinus as well. 3. Right periorbital and facial contusion. 4. Hemo sinus involving the right maxillary sinus. 5. Acute right-sided subdural hematoma, better evaluated on prior head CT. Electronically Signed   By: Jeannine Boga M.D.   On: 09/27/2016 20:15    Time Spent in minutes  35   Louellen Molder M.D on 10/01/2016 at 10:36 AM  Between 7am to 7pm - Pager - [phone]  After 7pm go to [email or web address] - password [facility]  [facility] -  Office  [phone]

## 2016-10-01 NOTE — Progress Notes (Signed)
Daily Progress Note   Patient Name: Angela Franco       Date: 10/01/2016 DOB: [birth year]  Age: 82 y.o. MRN#: [id number] Attending Physician: Louellen Molder, MD Primary Care Physician: Geoffery Lyons, MD Admit Date: 09/27/2016  Reason for Consultation/Follow-up: Hospice Evaluation, Inpatient hospice referral, Pain control and Psychosocial/spiritual support  Subjective: Patient had an MRI done on 09/30/2016 which did not show changes with subdural hematoma degree, or new CVA. Dr. Clementeen Graham has started a morphine drip on patient with family permission and she is markedly more comfortable. No myoclonus or muscle spasms observed to lower extremities today. She is not moaning or crying out. She does respond minimally to voice and touch  Length of Stay: 4  Current Medications: Scheduled Meds:  . acetaminophen  1,000 mg Intravenous Q8H  . methocarbamol (ROBAXIN)  IV  500 mg Intravenous Q8H    Continuous Infusions: . sodium chloride 50 mL/hr at 10/01/16 1051  . morphine 1 mg/hr (10/01/16 1110)    PRN Meds: acetaminophen **OR** acetaminophen, LORazepam, morphine, ondansetron **OR** ondansetron (ZOFRAN) IV  Physical Exam  Constitutional:  Frail elderly female . Extensive bruising to face 2/2 fall  Cardiovascular:  Tachy, irrg  Pulmonary/Chest: Effort normal.  Neurological:  Responds to touch No myoclonus observed. She is not moaning or crying out  Psychiatric:  Unable to test. No agitation  Nursing note and vitals reviewed.           Vital Signs: BP (!) 140/92 (BP Location: Right Arm)   Pulse (!) 119   Temp 97.6 F (36.4 C) (Oral)   Resp 16   Ht 5\' 2"  (1.575 m)   Wt 61.1 kg (134 lb 11.2 oz)   SpO2 97%   BMI 24.64 kg/m  SpO2: SpO2: 97 % O2 Device: O2 Device: Not  Delivered O2 Flow Rate: O2 Flow Rate (L/min): 2 L/min  Intake/output summary: No intake or output data in the 24 hours ending 10/01/16 1441 LBM: Last BM Date: 09/30/16 Baseline Weight: Weight: 61.6 kg (135 lb 12.9 oz) Most recent weight: Weight: 61.1 kg (134 lb 11.2 oz)       Palliative Assessment/Data:      Patient Active Problem List   Diagnosis Date Noted  . Acute right-sided weakness   . Aphasia   . Palliative care encounter 09/27/2016  . Subdural hematoma (  [place]) 09/27/2016  . Right orbital fracture ([place]) 09/27/2016  . Sepsis ([place]) 07/19/2016  . Aspiration pneumonia ([place]) 07/19/2016  . Fever 07/19/2016  . Leukocytosis 07/19/2016  . Acute respiratory failure with hypoxia ([place]) 02/28/2016  . Acute renal failure superimposed on stage 3 chronic kidney disease ([place]) 02/28/2016  . Acute on chronic diastolic heart failure ([place])   . Acute on chronic systolic heart failure ([place])   . Respiratory syncytial virus (RSV) infection   . Chronic systolic heart failure ([place])   . COPD exacerbation ([place]) 01/21/2016  . Chronic renal impairment, stage 3 (moderate) 11/10/2015  . COPD by CXR 08/26/2012  . Non-ischemic cardiomyopathy EF improved from 30-35% to 40-45% by Echo in 07/2012 06/30/2012  . LBBB (left bundle branch block) 06/30/2012  . Chronic anticoagulation 06/30/2012  . HTN (hypertension) 06/30/2012  . Systolic and diastolic CHF, acute on chronic,  06/29/2012  . Hypo-osmolality and hyponatremia 06/29/2012  . Normocytic anemia 06/29/2012  . Atrial fibrillation, chronic ([place]) 06/29/2012  . Trigeminal neuralgia 06/29/2012  . Moderate to severe pulmonary hypertension, PA pressure in 70s June 2013 06/29/2012    Palliative Care Assessment & Plan   Patient Profile:  82 year old female with A. fib on anticoagulation, chronic systolic CHF, CK D and chronic anemia presented from assisted living after sustaining a fall with head CT showing right subdural hematoma and acute fracture of the  right orbital floor. On-call neurosurgeon Dr. Arnoldo Morale was consulted by ED physician and recommended she did not need acute intervention. Trauma surgery consulted who also recommended no surgical intervention. She has occasionally say a word or 2 today and did indicate to family that her face did hurt.  Assessment: Patient is only minimally responsive. She appears much more comfortable, no agitation, no myoclonus or muscle spasms, no moaning and crying out. Daughter one son are at the bedside and are pleased that she looks so much more comfortable and her pursuing inpatient hospice for end-of-life care  Recommendations/Plan:  Inpatient hospice for end-of-life care. Consult in place to social work to help arrange. Family is open to hosp[facility] home [facility] in [place], an inpatient facility in [place] with morphine infusion. We'll add a bolus rate of 1 mg every 30 minutes as needed  Goals of Care and Additional Recommendations:  Limitations on Scope of Treatment: Full Comfort Care  Code Status:    Code Status Orders        Start     Ordered   09/27/16 2223  Do not attempt resuscitation (DNR)  Continuous    Question Answer Comment  In the event of cardiac or respiratory ARREST Do not call a "code blue"   In the event of cardiac or respiratory ARREST Do not perform Intubation, CPR, defibrillation or ACLS   In the event of cardiac or respiratory ARREST Use medication by any route, position, wound care, and other measures to relive pain and suffering. May use oxygen, suction and manual treatment of airway obstruction as needed for comfort.      09/27/16 2224    Code Status History    Date Active Date Inactive Code Status Order ID Comments User Context   09/27/2016  7:16 PM 09/27/2016 10:24 PM DNR [id number]  Acquanetta Chain, DO ED   07/20/2016  2:51 AM 07/22/2016  6:06 PM DNR [id number]  Lily Kocher, MD Inpatient   02/28/2016  1:01 PM 03/02/2016  6:31 PM DNR  [id number]  Radene Gunning, NP ED   01/21/2016  5:18 AM 01/26/2016  6:01 PM DNR [id number]  Bethena Roys, MD ED   01/21/2016 12:35 AM 01/21/2016  5:18 AM DNR [id number]  Ivor Costa, MD ED   01/02/2016  5:26 PM 01/04/2016  5:45 PM DNR [id number]  Norman Herrlich, MD Inpatient   12/29/2015  5:08 PM 12/31/2015  7:38 PM DNR [id number]  Bethena Roys, MD ED   08/23/2012  8:55 PM 08/26/2012  8:57 PM DNR [id number]  Dianne Dun, NP Inpatient   06/29/2012  5:38 PM 07/08/2012  6:44 PM DNR [id number]  Tonny Branch, RN Inpatient    Advance Directive Documentation   Flowsheet Row Most Recent Value  Type of Advance Directive  Out of facility DNR (pink MOST or yellow form)  Pre-existing out of facility DNR order (yellow form or pink MOST form)  Yellow form placed in chart (order not valid for inpatient use)  "MOST" Form in Place?  No data       Prognosis:   < 2 weeks in the setting of a recent fall with subdural hematoma, orbital fracture, cancer of unknown primary, no longer eating and drinking, or able to take oral medications  Discharge Planning:  Hospice facility    Thank you for allowing the Palliative Medicine Team to assist in the care of this patient.   Time In: 1300 Time Out: 1330 Total Time 39min Prolonged Time Billed  no       Greater than 50%  of this time was spent counseling and coordinating care related to the above assessment and plan.  Dory Horn, NP  Please contact Palliative Medicine Team phone at [phone] for questions and concerns.

## 2016-10-01 NOTE — Progress Notes (Signed)
Witnessed pt moaning and grimacing with bouts of pain. Pt was administered IV Morphine @ 0604. Sitter in room updated on time of next available dose @ 1004. RN will continue to monitor.

## 2016-10-01 NOTE — Discharge Summary (Signed)
Physician Discharge Summary  Angela Franco [id number] DOB: December 11, 1928 DOA: 09/27/2016  PCP: Geoffery Lyons, MD  Admit date: 09/27/2016 Discharge date: 10/02/2016  Admitted From: Home Disposition:  Residential hospice [facility] place)  Recommendations for Outpatient Follow-up:  1. Follow-up at the residential hospice.    Discharge Condition: Guarded CODE STATUS:DO NOT RESUSCITATE Diet recommendation: Nothing by mouth/comfort feeds as tolerated    Discharge Diagnoses:  Principal Problem:   Subdural hematoma (HCC)   Active Problems:   Atrial fibrillation, chronic (HCC)   Trigeminal neuralgia   Moderate to severe pulmonary hypertension, PA pressure in 70s June 2013   Non-ischemic cardiomyopathy EF improved from 30-35% to 40-45% by Echo in 07/2012   LBBB (left bundle branch block)   Chronic renal impairment, stage 3 (moderate)   Chronic systolic heart failure (HCC)   Palliative care encounter   Right orbital fracture (HCC)   Acute right-sided weakness   Aphasia   Hand or foot spasms   Brief narrative/history of present illness Please refer to admission H&P for details, in brief,82 year old female with A. fib on anticoagulation, chronic systolic CHF, CK D and chronic anemia presented from assisted living after sustaining a fall with head CT showing right subdural hematoma and acute fracture of the right orbital floor. On-call neurosurgeon Dr. Arnoldo Morale was consulted by ED physician and recommended she did not need acute intervention. Trauma surgery consulted who also recommended no surgical intervention.  Hospital course Principal Problem:   Subdural hematoma (HCC) Conservative management per trauma and neurosurgery. Follow-up head CT unchanged. Discontinued anticoagulation.  Active Problems: Acute encephalopathy Started since the night of 10/27. Poorly communicative. MRI done negative for stroke and unchanged size of subdural hematoma. Patient restless and crying out  in pain. Discussed with patient's children at bedside. They wish her to be comfortable and alleviate her pain. Agree on making her comfort care .  start on low-dose morphine drip.. Scheduled Ativan for agitation. Added IV Robaxin for muscle spasms.  Patient will be discharged to residential hospice on 10/30.  Bilateral lower extremity weakness and cramping Possibly associated with hematoma. On IV Robaxin.  Right orbital floor lamina papyracea fracture Has preorbital hematoma.     Atrial fibrillation, chronic (HCC)  Episodes of bradycardia during the night, mostly tachycardic. Off anticoagulation.    Trigeminal neuralgia Unable to take Trileptal due to encephalopathy.    Non-ischemic cardiomyopathy  Chronic systolic congestive heart failure (HCC) - Compensated , EF of 40-45% - Lasix as needed for dyspnea.  Dyslipidemia On statin.  Hyponatremia - Sodium 127 on the admission, possibly related to subdural hematoma versus dehydration.some improvement with hydration.  Iron deficiency anemia / anemia of chronic kidney disease   Acute renal failure superimposed on chronic kidney disease stage III - Baseline creatinine 2 months ago was 1.47 Lasix held.  Goals of care Palliative care following. Discussed with family at bedside. Given her worsening encephalopathy and progressive decline in function daily on keeping her comfort care. Agree with starting low-dose morphine drip, Ativan for anxiety. If stable over next 24 hours daily on residential hospice.    Procedures:   CT head and cervical spine  MRI brain       Family Communication  : Sons and daughter at bedside  Disposition Plan  :  residential hospice   Consults  :   Neurology Trauma surgery Neurosurgery [facility] palliative care   Discharge Instructions     Medication List    STOP taking these medications   acetaminophen 325 MG tablet Commonly  known as:  TYLENOL    acetaminophen-codeine 300-30 MG tablet Commonly known as:  TYLENOL #3   allopurinol 100 MG tablet Commonly known as:  ZYLOPRIM   apixaban 2.5 MG Tabs tablet Commonly known as:  ELIQUIS   bisacodyl 5 MG EC tablet Commonly known as:  DULCOLAX   calcium-vitamin D 500-200 MG-UNIT tablet Commonly known as:  OSCAL WITH D   carvedilol 6.25 MG tablet Commonly known as:  COREG   cefpodoxime 200 MG tablet Commonly known as:  VANTIN   feeding supplement (ENSURE ENLIVE) Liqd   ferrous sulfate 325 (65 FE) MG tablet   furosemide 80 MG tablet Commonly known as:  LASIX   gabapentin 600 MG tablet Commonly known as:  NEURONTIN   guaiFENesin 600 MG 12 hr tablet Commonly known as:  MUCINEX   ipratropium-albuterol 0.5-2.5 (3) MG/3ML Soln Commonly known as:  DUONEB   Melatonin 5 MG Tabs   OXcarbazepine 150 MG tablet Commonly known as:  TRILEPTAL   oxyCODONE 5 MG immediate release tablet Commonly known as:  Oxy IR/ROXICODONE   potassium chloride SA 20 MEQ tablet Commonly known as:  K-DUR,KLOR-CON   saccharomyces boulardii 250 MG capsule Commonly known as:  FLORASTOR   sennosides-docusate sodium 8.6-50 MG tablet Commonly known as:  SENOKOT-S   simvastatin 10 MG tablet Commonly known as:  ZOCOR   traMADol 50 MG tablet Commonly known as:  ULTRAM   zolpidem 5 MG tablet Commonly known as:  AMBIEN     TAKE these medications   LORazepam 2 MG/ML injection Commonly known as:  ATIVAN Inject 0.5 mLs (1 mg total) into the vein every 4 (four) hours as needed (agitation).   morphine 250 mg in sodium chloride 0.9 % 240 mL Inject 1 mg/hr into the vein continuous.      Follow-up Information    residential hospice .          Allergies  Allergen Reactions  . Other Shortness Of Breath, Other (See Comments) and Cough    Dust and mold (allergies)  . Tape Other (See Comments)    SKIN IS THIN AND WILL TEAR AND BRUISE EASILY!!  . Penicillins Hives    Has patient had a PCN  reaction causing immediate rash, facial/tongue/throat swelling, SOB or lightheadedness with hypotension: Yes Has patient had a PCN reaction causing severe rash involving mucus membranes or skin necrosis: No Has patient had a PCN reaction that required hospitalization No Has patient had a PCN reaction occurring within the last 10 years: No If all of the above answers are "NO", then may proceed with Cephalosporin use.       Procedures/Studies: Dg Chest 1 View  Result Date: 09/27/2016 CLINICAL DATA:  Fall today with right-sided chest pain, initial encounter EXAM: CHEST 1 VIEW COMPARISON:  09/22/2016 FINDINGS: Cardiac shadow remains enlarged. The lungs are well aerated bilaterally without focal infiltrate or sizable effusion. No acute bony abnormality is seen. IMPRESSION: No acute abnormality noted. Electronically Signed   By: Inez Catalina M.D.   On: 09/27/2016 21:30   Ct Head Wo Contrast  Result Date: 09/29/2016 CLINICAL DATA:  Parafalcine and tentorial hematoma with right-sided weakness after fall. EXAM: CT HEAD WITHOUT CONTRAST TECHNIQUE: Contiguous axial images were obtained from the base of the skull through the vertex without intravenous contrast. COMPARISON:  09/28/2016 and dating back through 09/27/2016 FINDINGS: Brain: Slightly smaller appearing right parafalcine and tentorial subdural hematoma measuring up to 13 mm in thickness on current exam on the axial study, series 201,  image 20. No acute large vascular territory infarction. No insular ribbon sign. Mild superficial atrophy. Vascular: No hyperdense vessel or unexpected calcification. Skull: Normal. Negative for fracture or focal lesion. Sinuses/Orbits: Air-fluid level in the partially visualized right maxillary sinus, smaller in volume than the previously noted. The ethmoid, sphenoid and visualized left maxillary sinus appear clear. No mastoid effusions. Orbits appear symmetric without acute abnormality. Other: None IMPRESSION: Slightly  smaller right parafalcine and tentorial subdural hematoma measuring 13 mm versus 16 mm initially in thickness. No acute large vascular territory infarction. Chronic small vessel ischemic disease of periventricular white matter. Electronically Signed   By: Ashley Royalty M.D.   On: 09/29/2016 15:31   Ct Head Wo Contrast  Result Date: 09/28/2016 CLINICAL DATA:  82 year old female follow-up subdural hematoma Subsequent encounter. EXAM: CT HEAD WITHOUT CONTRAST TECHNIQUE: Contiguous axial images were obtained from the base of the skull through the vertex without intravenous contrast. COMPARISON:  09/27/2016. FINDINGS: Brain: Large right parafalcine subdural hematoma with extension along the right tentorium similar to prior exam. Maximal thickness 1.6 cm. Tiny anterior left parafalcine without change. Artifact inferior temporal lobes bilaterally suspected. No CT evidence of large acute infarct. Chronic microvascular changes. Mild global atrophy without hydrocephalus. Vascular: Vascular calcifications. Skull: No skull fracture. Sinuses/Orbits: Right orbital floor and lamina papyracea fracture. Pre orbital hematoma. Opacification right maxillary sinus. Other: Negative IMPRESSION: Large right parafalcine subdural hematoma with extension along the right tentorium similar to prior exam. Maximal thickness 1.6 cm. Tiny anterior left parafalcine without change. Right orbital floor and lamina papyracea fracture. Pre orbital hematoma. Opacification right maxillary sinus. Electronically Signed   By: Genia Del M.D.   On: 09/28/2016 07:59   Ct Cervical Spine Wo Contrast  Result Date: 09/27/2016 CLINICAL DATA:  Patient fell at around 11 a.m. this morning with numbness in her left leg. EXAM: CT CERVICAL SPINE WITHOUT CONTRAST TECHNIQUE: Multidetector CT imaging of the cervical spine was performed without intravenous contrast. Multiplanar CT image reconstructions were also generated. COMPARISON:  None. FINDINGS: Alignment:  Normal. Skull base and vertebrae: Intact. Craniocervical relationship is maintained. Soft tissues and spinal canal: No prevertebral soft tissue swelling. No significant canal stenosis. Low moderate right C3-4, C5-6 and mild C6-7 neural foraminal encroachment osteophytes. Mild left C3-4 and C5-6 neural foraminal narrowing from osteophytes. Disc levels: Degenerative disc space narrowing at C5-6 and C6-7 with small posterior marginal osteophytes. Bilateral uncovertebral osteoarthritis C3-4, C4-5, C5-6 and right C6-7. Upper chest: Pleural-parenchymal scarring bilaterally. No dominant mass seen. Other: Mastoids are aerated bilaterally. Bilateral extracranial carotid arteriosclerosis. IMPRESSION: Multilevel degenerative facet arthropathy. Degenerative disc disease C5-6 and C6-7. No acute cervical spine osseous abnormality. Electronically Signed   By: Ashley Royalty M.D.   On: 09/27/2016 18:41   Mr Brain Wo Contrast  Result Date: 09/30/2016 CLINICAL DATA:  Aphasia. Intracranial hemorrhage. History of fall and subdural hematoma EXAM: MRI HEAD WITHOUT CONTRAST TECHNIQUE: Multiplanar, multiecho pulse sequences of the brain and surrounding structures were obtained without intravenous contrast. COMPARISON:  CT head 09/29/2016 FINDINGS: Brain: Posterior interhemispheric subdural hematoma on the right is unchanged in size measuring approximately 12 mm in thickness. This extends along the tentorium where there is a small amount of subdural hemorrhage. There is methemoglobin in both areas of hemorrhage. No shift of the midline structures. No evidence of subarachnoid hemorrhage. Generalized atrophy. Negative for hydrocephalus. Negative for acute infarct. Mild chronic microvascular ischemic change in the white matter. Negative for mass lesion. Vascular: Normal arterial flow voids. Skull and upper cervical spine: Negative  Sinuses/Orbits: Mild mucosal edema in the paranasal sinuses. Mild mastoid sinus effusion bilaterally. Air-fluid  level in the right maxillary sinus. Bilateral lens replacement. Other: None IMPRESSION: Posterior right interhemispheric subdural hematoma unchanged from recent CT. This extends along the right tentorium. No new hemorrhage since the prior CT Negative for acute infarct. Generalized atrophy.  Negative for hydrocephalus or air-fluid level Air-fluid level right maxillary sinus unchanged from the prior CT. Electronically Signed   By: Franchot Gallo M.D.   On: 09/30/2016 16:49   Ct Chest High Resolution  Result Date: 09/22/2016 CLINICAL DATA:  82 year old female with history of cough and recurrent pneumonia. Evaluate for interstitial lung disease. EXAM: CT CHEST WITHOUT CONTRAST TECHNIQUE: Multidetector CT imaging of the chest was performed following the standard protocol without intravenous contrast. High resolution imaging of the lungs, as well as inspiratory and expiratory imaging, was performed. COMPARISON:  High-resolution chest CT 04/14/2016. FINDINGS: Cardiovascular: Heart size is enlarged with right atrial dilatation. There is no significant pericardial fluid, thickening or pericardial calcification. There is aortic atherosclerosis, as well as atherosclerosis of the great vessels of the mediastinum and the coronary arteries, including calcified atherosclerotic plaque in the left anterior descending and left circumflex coronary arteries. Calcifications of the aortic valve and mitral annulus. Pulmonic trunk is mildly dilated measuring 3.6 cm in diameter. Mediastinum/Nodes: No pathologically enlarged mediastinal or hilar lymph nodes. Please note that accurate exclusion of hilar adenopathy is limited on noncontrast CT scans. Esophagus is unremarkable in appearance. Lungs/Pleura: High-resolution images demonstrates a remarkable mosaic pattern of attenuation throughout the lungs bilaterally with extensive areas of lucency interposed with areas of ground-glass attenuation. Throughout the lucent areas, the pulmonary  arteries appear diminutive; imaging findings concerning for underlying chronic pulmonary thromboembolism. Inspiratory and expiratory imaging clearly demonstrates air trapping, which appears to correspond to the pre-existing areas of lucency, indicative of concurrent small airways disease. Expiratory images also demonstrate profound collapse of the trachea and mainstem bronchi, indicative of tracheobronchomalacia. There are no areas of significant subpleural reticulation, parenchymal banding, traction bronchiectasis or frank honeycombing. Bilateral apical pleural parenchymal thickening, most compatible with chronic post infectious or inflammatory scarring is noted. Upper Abdomen: Retroperitoneal lymphadenopathy is again noted, with the largest lymph node measuring up to 18 mm in short axis in the left para-aortic nodal station adjacent to the left renal hilum, similar to prior study 05/05/2016. Aortic atherosclerosis. Musculoskeletal: There are no aggressive appearing lytic or blastic lesions noted in the visualized portions of the skeleton. IMPRESSION: 1. There is no evidence to suggest interstitial lung disease. However, there are imaging findings which are very concerning for chronic pulmonary thromboembolic disease, as discussed above. 2. In addition, there is evidence of extensive air trapping from small airways disease, and there is severe tracheobronchomalacia. 3. Cardiomegaly with severe dilatation of the right atrium. 4. Dilatation of the pulmonic trunk (3.6 cm in diameter), suggestive of pulmonary arterial hypertension. 5. Aortic atherosclerosis, in addition to 2 vessel coronary artery disease. 6. There are calcifications of the aortic valve and mitral annulus. Echocardiographic correlation for evaluation of potential valvular dysfunction may be warranted if clinically indicated. 7. Upper retroperitoneal lymphadenopathy similar to prior examination 07/18/2016 and 05/05/2016, previously biopsy proven to  reflect metastatic disease from unknown adenocarcinoma. Electronically Signed   By: Vinnie Langton M.D.   On: 09/22/2016 15:21   Dg Chest Port 1 View  Result Date: 09/30/2016 CLINICAL DATA:  Encephalopathy.  COPD. EXAM: PORTABLE CHEST 1 VIEW COMPARISON:  09/27/2016 FINDINGS: The lungs are hyperinflated likely secondary to  COPD. There is mild bilateral chronic interstitial thickening. There is no focal parenchymal opacity. There is no pleural effusion or pneumothorax. There is stable cardiomegaly. There is thoracic aortic atherosclerosis. The osseous structures are unremarkable. IMPRESSION: No active disease. Electronically Signed   By: Kathreen Devoid   On: 09/30/2016 09:54   Ct Head Code Stroke W/o Cm  Result Date: 09/27/2016 CLINICAL DATA:  Code stroke.  Flaccid left leg. EXAM: CT HEAD WITHOUT CONTRAST TECHNIQUE: Contiguous axial images were obtained from the base of the skull through the vertex without intravenous contrast. COMPARISON:  Prior MRI from 08/25/2012. FINDINGS: Brain: Acute right parafalcine subdural hematoma with extension along the tentorium. Hematoma measures up to 16 mm in greatest diameter. No associated mass effect. And spinal 9 knee discal about its off each see slow as of distance for/with from the [place] IIA well bone left neck I do not see anything else lobe status stroke from on 5/air off palm of the cisterns supper all by her palm entity down eyelids most extensive in facial fractures of small a a a a No other acute intracranial hemorrhage. Generalized cerebral atrophy with mild chronic microvascular ischemic disease. No other evidence for acute large vessel territory infarct. Small remote left cerebellar infarct noted. No mass lesion, midline shift, or mass effect. No hydrocephalus. Vascular: No hyperdense vessel. Scattered vascular calcifications present within the carotid siphons. Skull: Right periorbital and facial contusion. Scalp soft tissues otherwise unremarkable. There is  associated right orbital floor fracture. Probable extension into the right lamina papyracea. Calvarium otherwise intact. Sinuses/Orbits: Right periorbital contusion. Globes grossly intact. There is associated right orbital floor fracture. Blood within the right maxillary sinus. No acute injury about the left globe and orbit. Mastoid air cells clear. ASPECTS [facility] Stroke Program Early CT Score) - Ganglionic level infarction (caudate, lentiform nuclei, internal capsule, insula, M1-M3 cortex): 7 - Supraganglionic infarction (M4-M6 cortex): 3 Total score (0-10 with 10 being normal): 10 IMPRESSION: 1. Acute right parafalcine subdural hematoma with extension along the right tentorium. No significant mass effect. 2. No other evidence for acute intracranial infarct or other process identified. 3. ASPECTS is 10 4. Right periorbital contusion with associated right orbital fractures. Further evaluation with dedicated maxillofacial CT recommended. 5. Mild atrophy with chronic microvascular ischemic disease. Small remote left cerebellar infarct. Critical Value/emergent results were called by telephone at the time of interpretation on 09/27/2016 at 6:31 pm to Dr. Shon Hale , who verbally acknowledged these results. Electronically Signed   By: Jeannine Boga M.D.   On: 09/27/2016 18:34   Ct Maxillofacial Wo Contrast  Result Date: 09/27/2016 CLINICAL DATA:  Initial evaluation for acute trauma, fall. EXAM: CT MAXILLOFACIAL WITHOUT CONTRAST TECHNIQUE: Multidetector CT imaging of the maxillofacial structures was performed. Multiplanar CT image reconstructions were also generated. A small metallic BB was placed on the right temple in order to reliably differentiate right from left. COMPARISON:  Prior head CT from earlier the same day. FINDINGS: Osseous: Zygomatic arches intact. Possible the faint acute nondisplaced fracture through the posterior wall of the right maxillary sinus. No other acute maxillary fracture. Pterygoid  plates intact. Nasal bones intact. Nasal septum midline and intact. Mandible intact. Mandibular condyles normally situated. No acute abnormality about the dentition. Orbits: There is an acute trapped nor tight right orbital floor fracture. Fracture is at the level of the right infraorbital foramen. Approximately 4 mm of superior displayed placement of the fracture fragment into the bony right orbit. Adjacent soft tissue stranding with few foci of gas.  Right inferior rectus muscle remains normally positioned. Subtle associated fracture of the inferior right lamina for pre shift. Right orbital roof and lateral wall of the bony right orbit intact. Right globe intact. No frank retro-orbital hematoma. No acute osseous abnormality about the left orbit. Left globe intact without retro-orbital pathology. Sinuses: Right maxillary sinus is essentially completely opacified with blood and fluid within the lumen. Mild scattered mucosal thickening within the ethmoidal air cells. Paranasal sinuses are otherwise clear. No mastoid effusion. Soft tissues: Prominent right periorbital and facial contusion. Limited intracranial: Right-sided subdural hematoma, better evaluated on prior head CT. IMPRESSION: 1. Acute right orbital floor fracture at the level of the right infraorbital foramen. 4 mm of superior displacement of the main fracture fragment which closely approximates the right inferior rectus muscle. Intact globes without frank retro-orbital hematoma. 2. Associated subtle acute fracture of the right lamina papyracea. Orbital floor fracture also likely extends through the posterior wall of the right maxillary sinus as well. 3. Right periorbital and facial contusion. 4. Hemo sinus involving the right maxillary sinus. 5. Acute right-sided subdural hematoma, better evaluated on prior head CT. Electronically Signed   By: Jeannine Boga M.D.   On: 09/27/2016 20:15       Subjective: Patient non-interactive, morning and  discomfort.  Discharge Exam: Vitals:   10/01/16 0515 10/01/16 1049  BP: (!) 143/68 (!) 140/92  Pulse: 98 (!) 119  Resp: 16 16  Temp: 97.6 F (36.4 C) 97.6 F (36.4 C)   Vitals:   10/01/16 0218 10/01/16 0300 10/01/16 0515 10/01/16 1049  BP: (!) 104/37 (!) 117/41 (!) 143/68 (!) 140/92  Pulse: (!) 105  98 (!) 119  Resp: 17  16 16   Temp: 98.2 F (36.8 C)  97.6 F (36.4 C) 97.6 F (36.4 C)  TempSrc: Axillary  Axillary Oral  SpO2: 100%  99% 97%  Weight:      Height:        Gen: Aphasic, moaning with discomfort HEENT:Right facial bruising,  Chest: Clear breath sounds bilaterally, no added sounds CVS: S1 and S2 irregular, no murmurs rub or gallop GI: soft, NT, ND, BS+ Foley placed Musculoskeletal: warm, no edema CNS: Responds occasionally with a yes to commands, mostly nonverbal, moaning with discomfort     The results of significant diagnostics from this hospitalization (including imaging, microbiology, ancillary and laboratory) are listed below for reference.     Microbiology: No results found for this or any previous visit (from the past 240 hour(s)).   Labs: BNP (last 3 results)  Recent Labs  01/02/16 1304 01/20/16 2241 02/28/16 1020  BNP 273.0* 234.3* [id number]   Basic Metabolic Panel:  Recent Labs Lab 09/27/16 1806 09/27/16 1812 09/28/16 0434 09/29/16 0442 09/29/16 1031 09/30/16 0515  NA 128* 127* 132* 130*  --  131*  K 3.6 3.5 3.5 3.8  --  4.5  CL 89* 92* 91* 91*  --  95*  CO2 26  --  30 30  --  27  GLUCOSE 95 95 82 95  --  103*  BUN 32* 31* 26* 28*  --  25*  CREATININE 2.08* 2.20* 1.79* 1.98*  --  1.76*  CALCIUM 9.7  --  9.2 9.3  --  9.1  MG  --   --   --   --  2.3  --   PHOS  --   --   --   --  4.2  --    Liver Function Tests:  Recent Labs Lab 09/27/16  1806  AST 29  ALT 13*  ALKPHOS 73  BILITOT 0.5  PROT 7.5  ALBUMIN 3.7   No results for input(s): LIPASE, AMYLASE in the last 168 hours. No results for input(s): AMMONIA in the last  168 hours. CBC:  Recent Labs Lab 09/27/16 1806 09/27/16 1812 09/28/16 0434 09/29/16 0442 09/30/16 0515  WBC 8.9  --  6.2 5.5 7.1  NEUTROABS 5.8  --   --   --   --   HGB 8.6* 9.2* 8.1* 7.9* 8.0*  HCT 26.5* 27.0* 24.3* 24.6* 25.0*  MCV 96.0  --  96.0 98.8 97.7  PLT 230  --  232 164 183   Cardiac Enzymes: No results for input(s): CKTOTAL, CKMB, CKMBINDEX, TROPONINI in the last 168 hours. BNP: Invalid input(s): POCBNP CBG:  Recent Labs Lab 09/27/16 1803 09/30/16 0611  GLUCAP 98 110*   D-Dimer No results for input(s): DDIMER in the last 72 hours. Hgb A1c No results for input(s): HGBA1C in the last 72 hours. Lipid Profile No results for input(s): CHOL, HDL, LDLCALC, TRIG, CHOLHDL, LDLDIRECT in the last 72 hours. Thyroid function studies No results for input(s): TSH, T4TOTAL, T3FREE, THYROIDAB in the last 72 hours.  Invalid input(s): FREET3 Anemia work up No results for input(s): VITAMINB12, FOLATE, FERRITIN, TIBC, IRON, RETICCTPCT in the last 72 hours. Urinalysis    Component Value Date/Time   COLORURINE YELLOW 09/28/2016 0501   APPEARANCEUR CLEAR 09/28/2016 0501   LABSPEC 1.010 09/28/2016 0501   PHURINE 7.0 09/28/2016 0501   GLUCOSEU NEGATIVE 09/28/2016 0501   HGBUR NEGATIVE 09/28/2016 0501   BILIRUBINUR NEGATIVE 09/28/2016 0501   KETONESUR NEGATIVE 09/28/2016 0501   PROTEINUR NEGATIVE 09/28/2016 0501   UROBILINOGEN 0.2 08/23/2012 1449   NITRITE NEGATIVE 09/28/2016 0501   LEUKOCYTESUR TRACE (A) 09/28/2016 0501   Sepsis Labs Invalid input(s): PROCALCITONIN,  WBC,  LACTICIDVEN Microbiology No results found for this or any previous visit (from the past 240 hour(s)).   Time coordinating discharge: Over 30 minutes  SIGNED:   Louellen Molder, MD  [facility] 10/01/2016, 3:22 PM Pager   If 7PM-7AM, please contact night-coverage [email or web address] Password [email or web address]

## 2016-10-01 NOTE — Clinical Social Work Note (Signed)
Clinical Social Worker received notification from MD stating that patient family has opted for full comfort care and agreeable with residential hospice placement.  CSW spoke with patient children at bedside and they are agreeable to [facility].  Facility with a bed available tomorrow morning - patient family aware.  Facility liaison to contact patient family directly to complete paperwork.  CSW updated RN and MD of patient current plans.  CSW remains available for support and to facilitate patient discharge needs once medically stable.  Barbette Or, [place]

## 2016-10-02 NOTE — Progress Notes (Signed)
Report called off to [facility] nurse. Awaiting on [facility] to transport pt to disposition. Delia Heady RN

## 2016-10-02 NOTE — Clinical Social Work Note (Signed)
Pt is ready for discharge today and will go to [facility]. Facility is ready to accept pt as discharge information has been received. Family has completed paperwork. RN will call report. [facility] will provide transportation. CSW provided supportive counseling to pt's family. CSW is signing off as no further needs identified.   Darden Dates, MSW, LCSW  Clinical Social Worker  [phone]

## 2016-10-02 NOTE — Progress Notes (Signed)
Patient seen and examined at the bedside. Patient's son at the bedside. Awaiting discharge to [facility], pending bed availability. No changes in medical management since 10/01/2016. Patient is stable for discharge once bed available. Please refer to discharge summary completed 10/01/2016.  Leisa Lenz [facility] [id number]

## 2016-10-02 NOTE — Progress Notes (Signed)
Patient resting quietly in no apparent distress.Turned and repositioned Q2hrs for comfort. Sitter at bedside.

## 2016-10-02 NOTE — Progress Notes (Signed)
RN spoke with CCMD, patient in afib, HR sustaining in the 150's.Patients RN notified. Patient has facial grimacing, occasional low grunt or moan. RN assessing patients current IV medications and will continue to monitor.

## 2016-10-02 NOTE — Progress Notes (Signed)
Physical Therapy Discharge Patient Details Name: Angela Franco MRN: [id number] DOB: [birth year] Today's Date: 10/02/2016 Time:  -     Patient discharged from PT services secondary to pt being transitioned to [facility].  Please see latest therapy progress note for current level of functioning and progress toward goals.    Progress and discharge plan discussed with patient and/or caregiver: Patient/Caregiver agrees with plan  GP     [place], Thornton Papas, [place] 10/02/2016, 9:16 AM

## 2016-10-02 NOTE — Progress Notes (Signed)
Witnessed waste of 220 mL morphine with Radio broadcast assistant.   1215 pm 10/02/2016  Cori Razor, RN

## 2016-10-02 NOTE — Consult Note (Signed)
[facility] Liaison  Met with patient's daughter [name] to complete registration visit. Daughter completed paper work for transfer today. Dr. Orpah Melter to assume care per family request. Discharge summary has been faxed. CSW Sarah aware.     RN please call report to [phone].  Thank you,  Erling Conte, LCSW [phone]

## 2016-10-02 NOTE — Progress Notes (Signed)
Pt discharge education and instructions completed. Pt IV removed and right AC IV remains intact to be use at facility. Pt picked up by PTAR to be transported off to disposition. Pt transported off unit via stretcher with family and belongings to the side. Pt remains unresponsive and Morphine drip wasted with RN LaToya per policy. Francis Gaines [place] RN.

## 2016-10-02 NOTE — Care Management Note (Signed)
Case Management Note  Patient Details  Name: Angela Franco MRN: [id number] Date of Birth: [birth year]  Subjective/Objective:                    Action/Plan: Pt discharging to [facility] today. No further needs per CM.   Expected Discharge Date:                  Expected Discharge Plan:  Skilled Nursing Facility  In-House Referral:  Clinical Social Work  Discharge planning Services  CM Consult  Post Acute Care Choice:    Choice offered to:     DME Arranged:    DME Agency:     HH Arranged:    [place] Agency:     Status of Service:  Completed, signed off  If discussed at [employer], dates discussed:    Additional Comments:  Pollie Friar, RN 10/02/2016, 12:05 PM

## 2016-10-03 ENCOUNTER — Ambulatory Visit: Payer: Medicare Other | Admitting: Internal Medicine

## 2016-10-06 NOTE — Progress Notes (Signed)
Pt cancelled appt and has not rescheduled. Will send to MR as FYI.

## 2016-11-03 DEATH — deceased

## 2017-01-03 ENCOUNTER — Telehealth: Payer: Self-pay | Admitting: Nurse Practitioner

## 2017-01-03 NOTE — Telephone Encounter (Signed)
Pt's daughter called to advise the pt passed 10/22/2016

## 2017-01-05 ENCOUNTER — Ambulatory Visit: Payer: Medicare Other | Admitting: Nurse Practitioner

## 2017-01-31 IMAGING — CR DG CHEST 2V
2 series · 2 of 2 positions shown · non-contrast
Comparison: 07/21/2016

CLINICAL DATA: Fever 103.6, confusion, low blood pressure, more
tired than normal today, history atrial fibrillation, COPD, heart
disease/nonischemic cardiomyopathy, hypertension

EXAM:
CHEST  2 VIEW

[w chest lat]
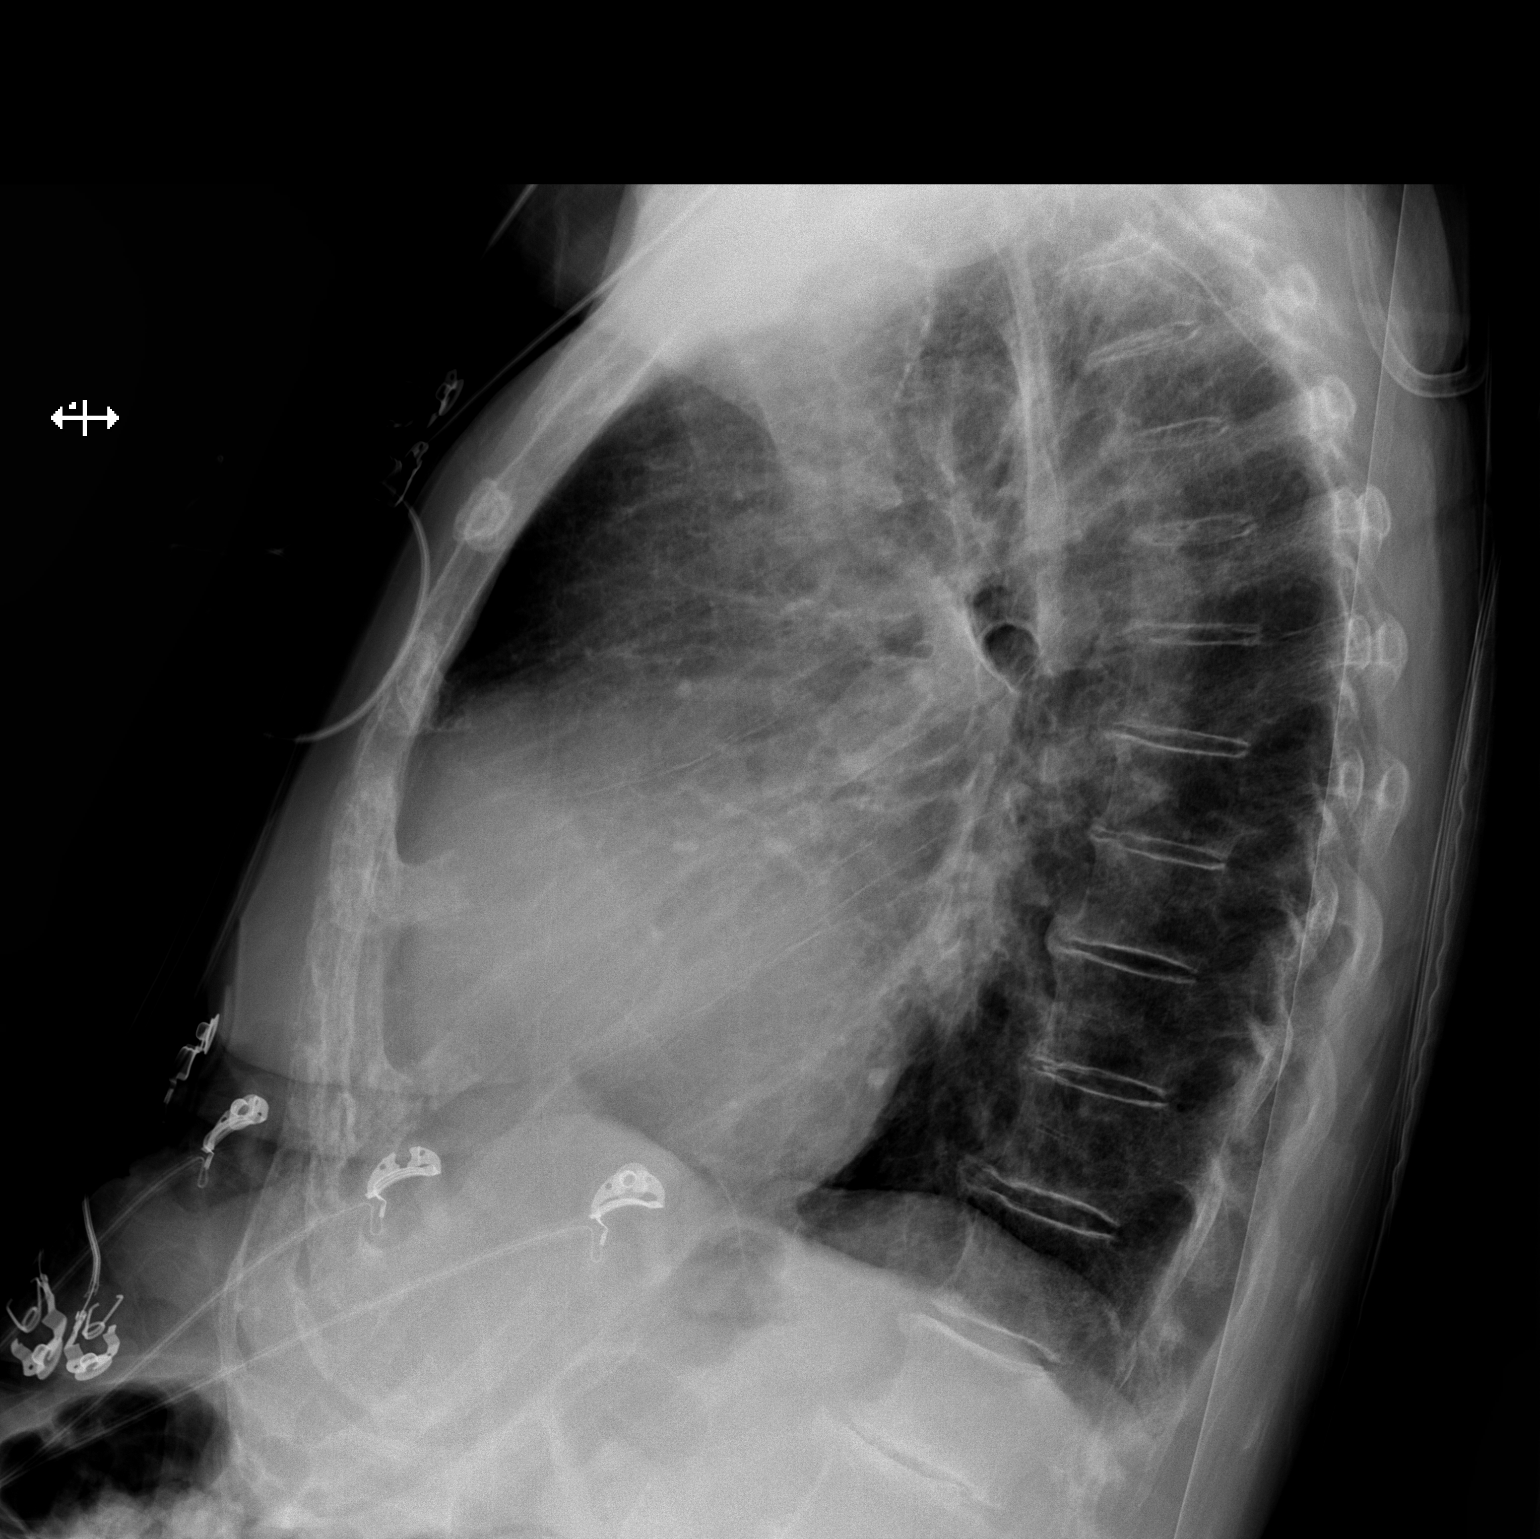

[x chest ap]
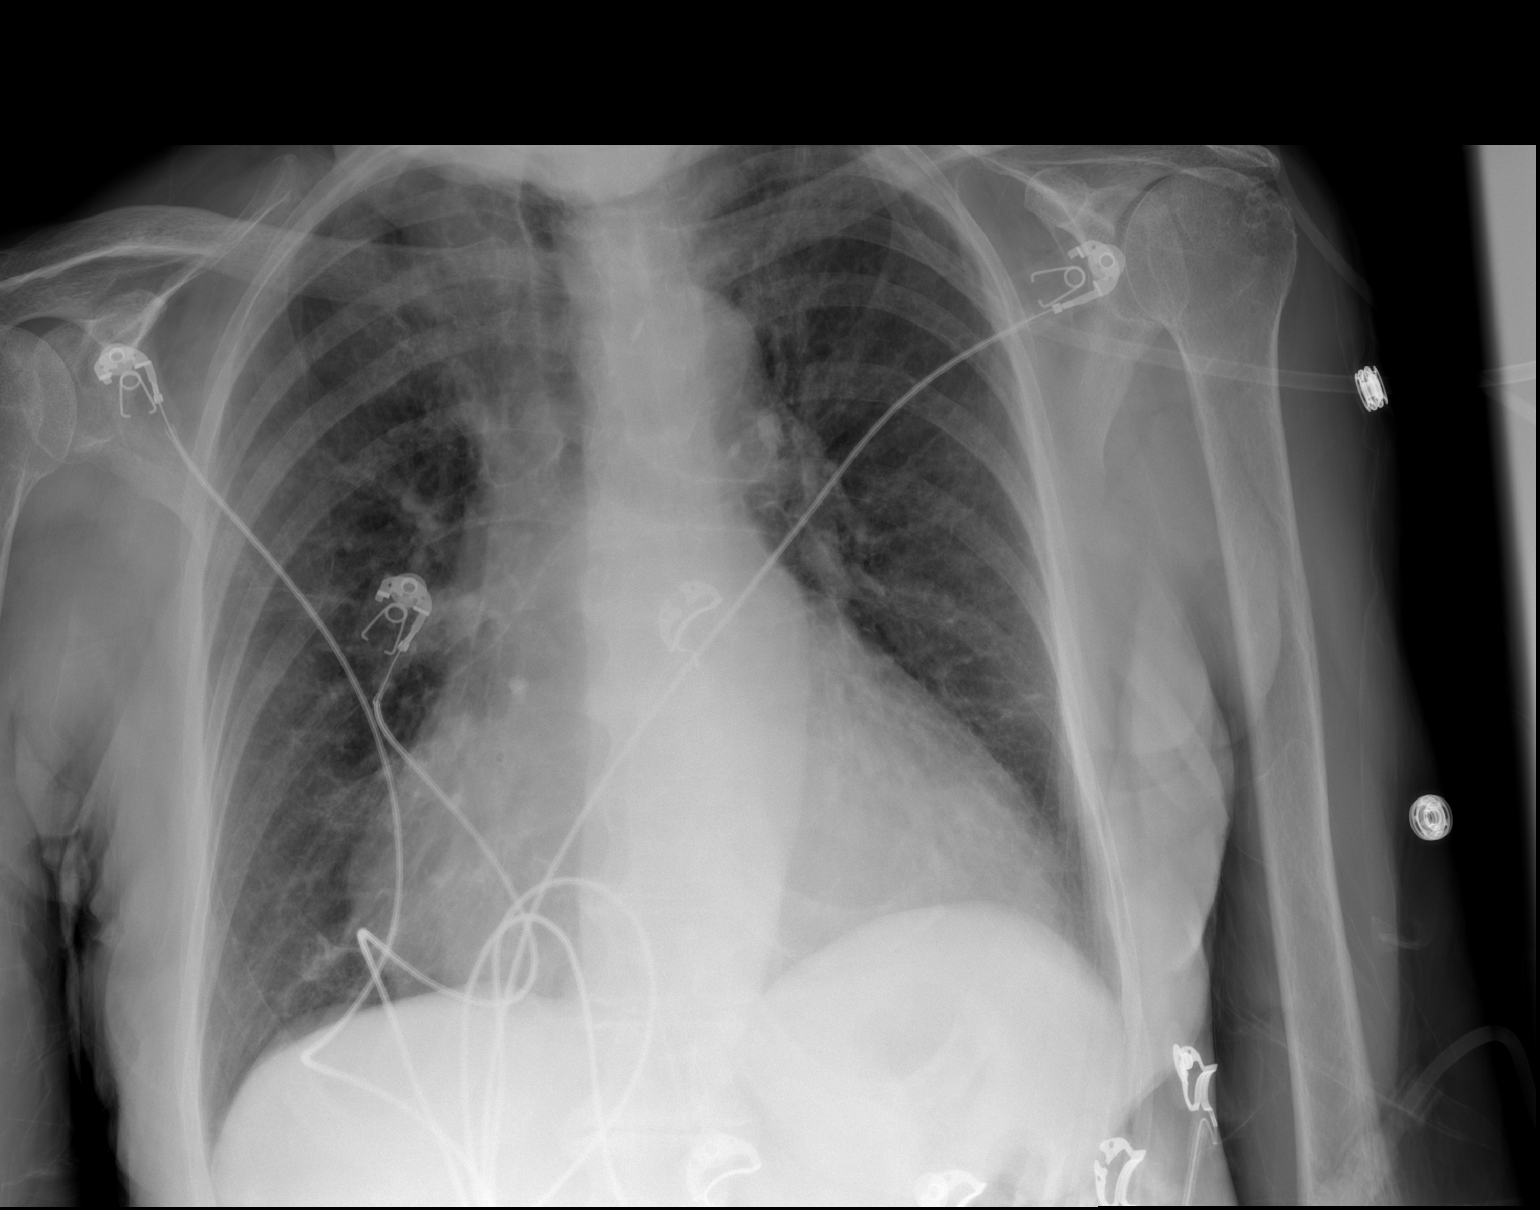

[2 of 2 positions shown; findings below may reference images not displayed]

FINDINGS: Enlargement of cardiac silhouette with vascular congestion.

Atherosclerotic calcification aorta.

Mediastinal contour stable.

Lungs emphysematous but clear.

No pleural effusion or pneumothorax.

Bones demineralized.
IMPRESSION: Enlargement of cardiac silhouette with pulmonary vascular
congestion.

COPD changes without acute infiltrate.

Aortic atherosclerosis.

## 2017-03-05 IMAGING — CT CT CHEST HIGH RESOLUTION W/O CM
2 of 6 series · 14 of 36 positions shown, 17 images · non-contrast
Comparison: High-resolution chest CT 04/14/2016.

CLINICAL DATA: 86-year-old female with history of cough and
recurrent pneumonia. Evaluate for interstitial lung disease.

EXAM:
CT CHEST WITHOUT CONTRAST
TECHNIQUE: Multidetector CT imaging of the chest was performed following the
standard protocol without intravenous contrast. High resolution
imaging of the lungs, as well as inspiratory and expiratory imaging,
was performed.

[Series 4: high resolution · axial · 0.54mm/px · z∈[-293,-25]mm · 11 of 150 slices shown, 14 images]
[im 8/150  mediastinal]
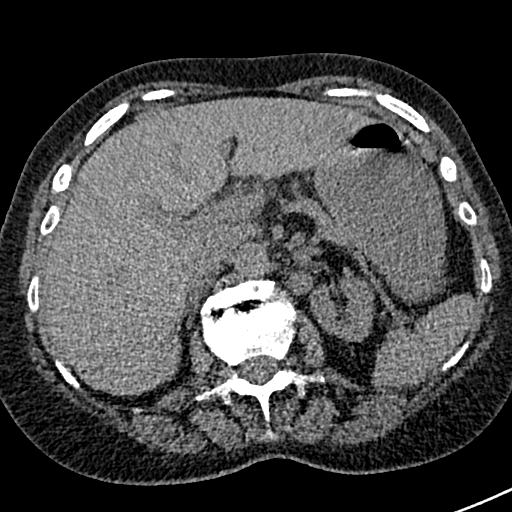
[im 8/150  lung]
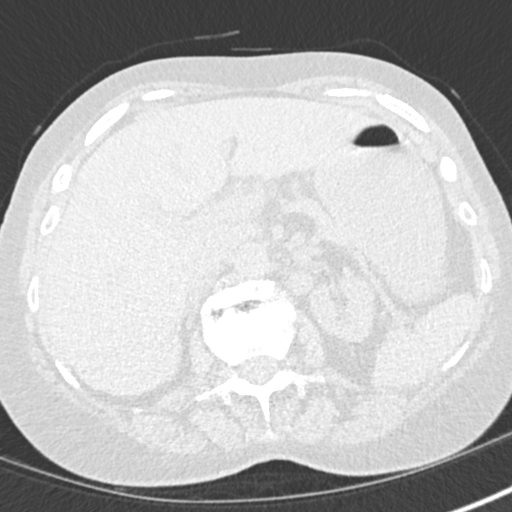
[im 24/150  lung]
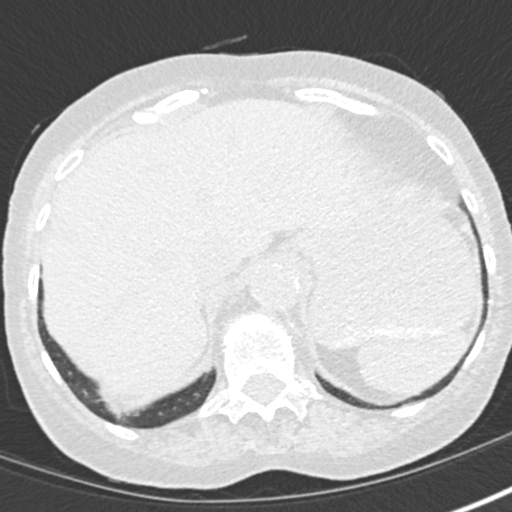
[im 40/150  lung]
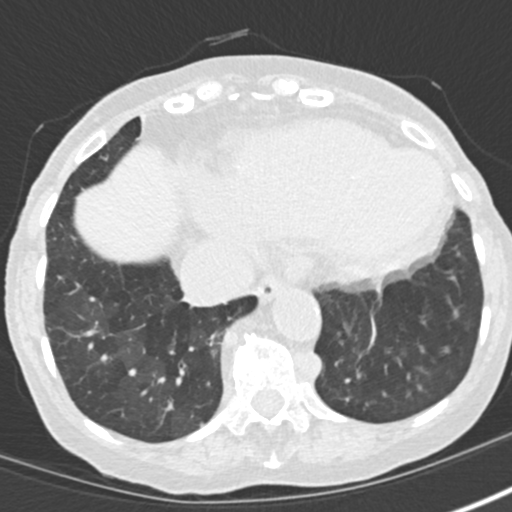
[im 48/150  lung]
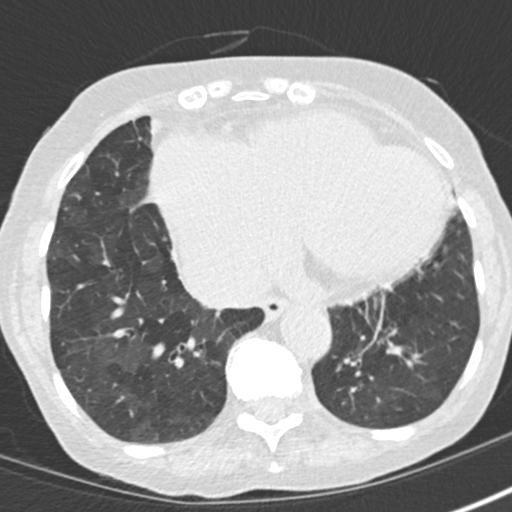
[im 63/150  mediastinal]
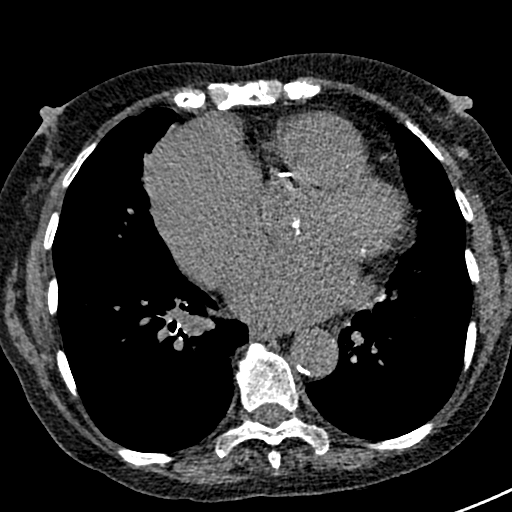
[im 63/150  lung]
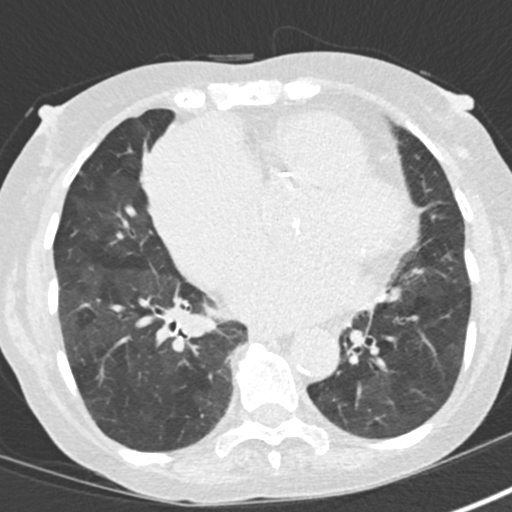
[im 79/150  lung]
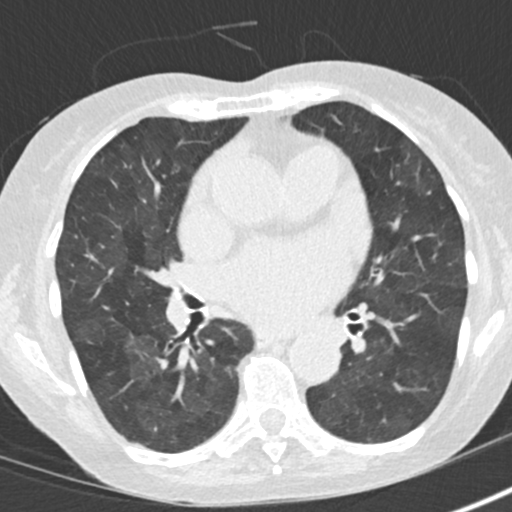
[im 87/150  lung]
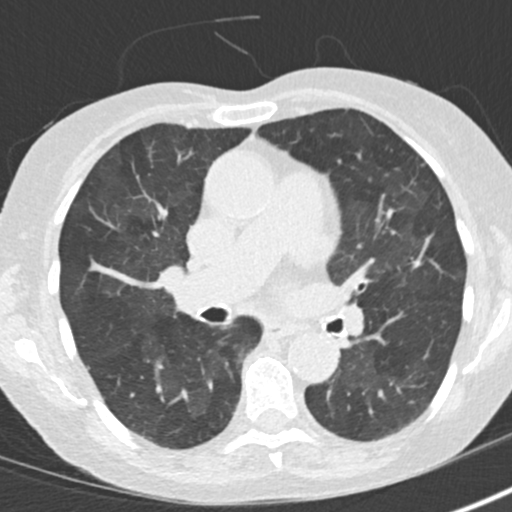
[im 102/150  lung]
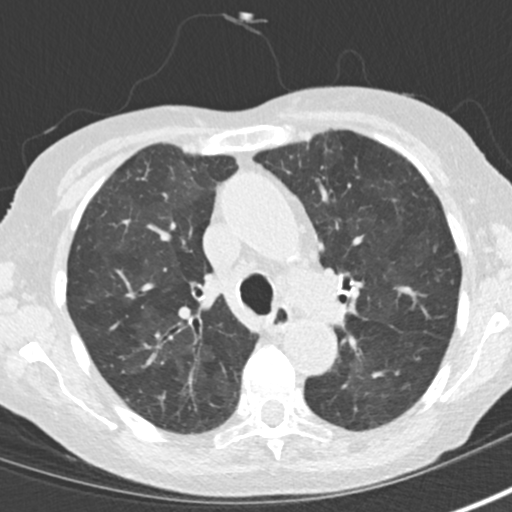
[im 110/150  mediastinal]
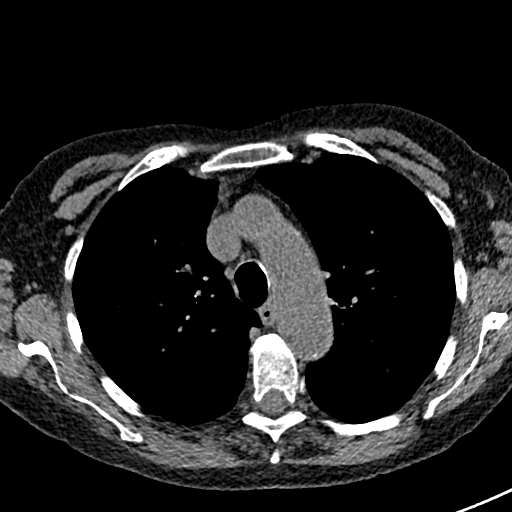
[im 110/150  lung]
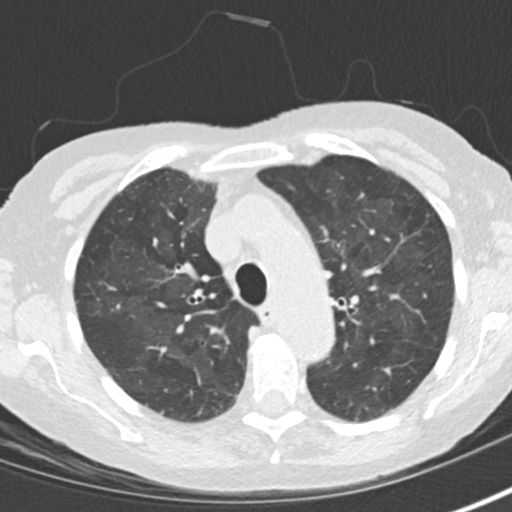
[im 126/150  lung]
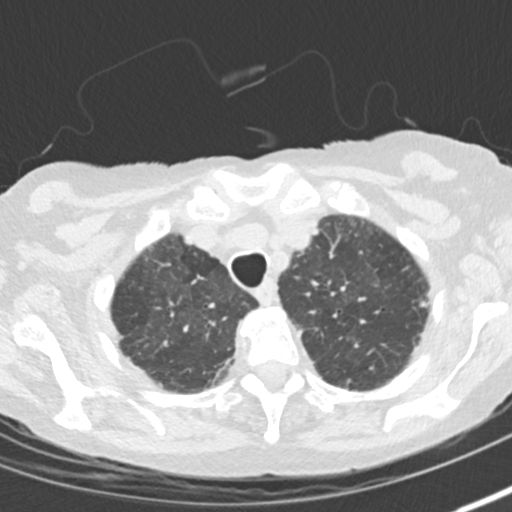
[im 142/150  lung]
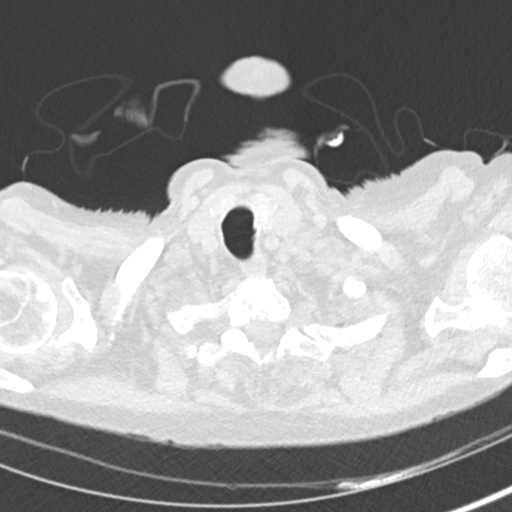

[Series 8: coronal · coronal · 0.59mm/px · 3 of 115 slices shown]
[im 23/115  lung]
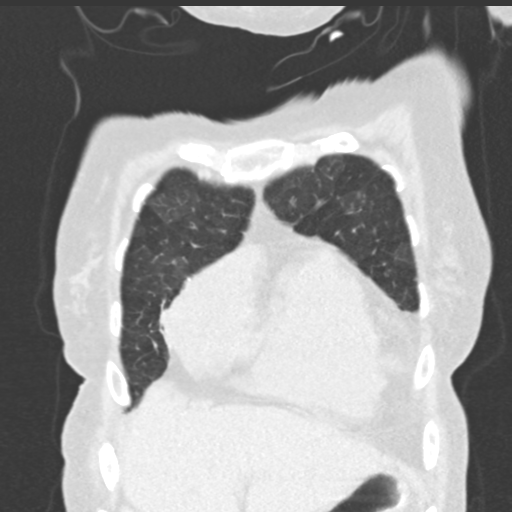
[im 46/115  lung]
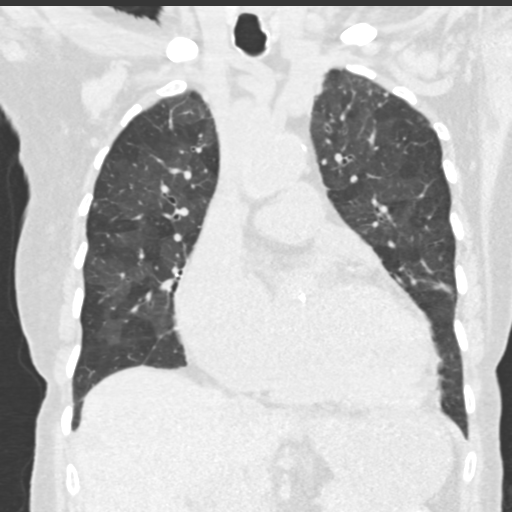
[im 69/115  lung]
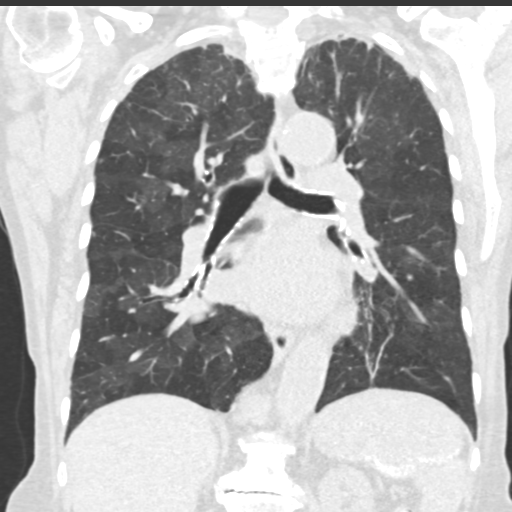

[14 of 36 positions shown; findings below may reference images not displayed]

FINDINGS: Cardiovascular: Heart size is enlarged with right atrial dilatation.
There is no significant pericardial fluid, thickening or pericardial
calcification. There is aortic atherosclerosis, as well as
atherosclerosis of the great vessels of the mediastinum and the
coronary arteries, including calcified atherosclerotic plaque in the
left anterior descending and left circumflex coronary arteries.
Calcifications of the aortic valve and mitral annulus. Pulmonic
trunk is mildly dilated measuring 3.6 cm in diameter.

Mediastinum/Nodes: No pathologically enlarged mediastinal or hilar
lymph nodes. Please note that accurate exclusion of hilar adenopathy
is limited on noncontrast CT scans. Esophagus is unremarkable in
appearance.

Lungs/Pleura: High-resolution images demonstrates a remarkable
mosaic pattern of attenuation throughout the lungs bilaterally with
extensive areas of lucency interposed with areas of ground-glass
attenuation. Throughout the lucent areas, the pulmonary arteries
appear diminutive; imaging findings concerning for underlying
chronic pulmonary thromboembolism. Inspiratory and expiratory
imaging clearly demonstrates air trapping, which appears to
correspond to the pre-existing areas of lucency, indicative of
concurrent small airways disease. Expiratory images also demonstrate
profound collapse of the trachea and mainstem bronchi, indicative of
tracheobronchomalacia. There are no areas of significant subpleural
reticulation, parenchymal banding, traction bronchiectasis or frank
honeycombing. Bilateral apical pleural parenchymal thickening, most
compatible with chronic post infectious or inflammatory scarring is
noted.

Upper Abdomen: Retroperitoneal lymphadenopathy is again noted, with
the largest lymph node measuring up to 18 mm in short axis in the
left para-aortic nodal station adjacent to the left renal hilum,
similar to prior study 05/05/2016. Aortic atherosclerosis.

Musculoskeletal: There are no aggressive appearing lytic or blastic
lesions noted in the visualized portions of the skeleton.
IMPRESSION: 1. There is no evidence to suggest interstitial lung disease.
However, there are imaging findings which are very concerning for
chronic pulmonary thromboembolic disease, as discussed above.
2. In addition, there is evidence of extensive air trapping from
small airways disease, and there is severe tracheobronchomalacia.
3. Cardiomegaly with severe dilatation of the right atrium.
4. Dilatation of the pulmonic trunk (3.6 cm in diameter), suggestive
of pulmonary arterial hypertension.
5. Aortic atherosclerosis, in addition to 2 vessel coronary artery
disease.
6. There are calcifications of the aortic valve and mitral annulus.
Echocardiographic correlation for evaluation of potential valvular
dysfunction may be warranted if clinically indicated.
7. Upper retroperitoneal lymphadenopathy similar to prior
examination 07/18/2016 and 05/05/2016, previously biopsy proven to
reflect metastatic disease from unknown adenocarcinoma.

## 2022-08-03 ENCOUNTER — Encounter: Payer: Self-pay | Admitting: Gastroenterology
